# Patient Record
Sex: Female | Born: 2000
Health system: Midwestern US, Community
[De-identification: ages and names within clinical notes are randomized; demographics above are authoritative.]

## PROBLEM LIST (undated history)

## (undated) DIAGNOSIS — R1031 Right lower quadrant pain: Secondary | ICD-10-CM

## (undated) DIAGNOSIS — R0789 Other chest pain: Secondary | ICD-10-CM

## (undated) DIAGNOSIS — R0602 Shortness of breath: Secondary | ICD-10-CM

## (undated) DIAGNOSIS — Z23 Encounter for immunization: Secondary | ICD-10-CM

## (undated) DIAGNOSIS — Z01818 Encounter for other preprocedural examination: Secondary | ICD-10-CM

## (undated) DIAGNOSIS — K648 Other hemorrhoids: Secondary | ICD-10-CM

## (undated) DIAGNOSIS — R112 Nausea with vomiting, unspecified: Secondary | ICD-10-CM

## (undated) DIAGNOSIS — K3184 Gastroparesis: Secondary | ICD-10-CM

## (undated) DIAGNOSIS — H547 Unspecified visual loss: Secondary | ICD-10-CM

## (undated) DIAGNOSIS — K56609 Unspecified intestinal obstruction, unspecified as to partial versus complete obstruction: Secondary | ICD-10-CM

## (undated) DIAGNOSIS — R634 Abnormal weight loss: Secondary | ICD-10-CM

## (undated) DIAGNOSIS — K529 Noninfective gastroenteritis and colitis, unspecified: Secondary | ICD-10-CM

## (undated) DIAGNOSIS — Z931 Gastrostomy status: Secondary | ICD-10-CM

## (undated) DIAGNOSIS — R197 Diarrhea, unspecified: Secondary | ICD-10-CM

## (undated) DIAGNOSIS — R350 Frequency of micturition: Secondary | ICD-10-CM

## (undated) DIAGNOSIS — R109 Unspecified abdominal pain: Secondary | ICD-10-CM

## (undated) DIAGNOSIS — Z1159 Encounter for screening for other viral diseases: Secondary | ICD-10-CM

## (undated) DIAGNOSIS — E559 Vitamin D deficiency, unspecified: Secondary | ICD-10-CM

## (undated) DIAGNOSIS — D61818 Other pancytopenia: Principal | ICD-10-CM

## (undated) DIAGNOSIS — Z30433 Encounter for removal and reinsertion of intrauterine contraceptive device: Secondary | ICD-10-CM

## (undated) DIAGNOSIS — K5901 Slow transit constipation: Secondary | ICD-10-CM

## (undated) DIAGNOSIS — Z111 Encounter for screening for respiratory tuberculosis: Secondary | ICD-10-CM

## (undated) DIAGNOSIS — D490 Neoplasm of unspecified behavior of digestive system: Secondary | ICD-10-CM

## (undated) MED FILL — GUANFACINE HCL ER 1MG TB24: 1 MG | 90 days supply | Qty: 90 | Fill #1 | Status: AC

## (undated) MED FILL — SERTRALINE HCL 50MG TABS: 50 MG | 90 days supply | Qty: 135 | Fill #0 | Status: AC

## (undated) MED FILL — OMEPRAZOLE 40MG CPDR: 40 MG | 90 days supply | Qty: 90 | Fill #0 | Status: AC

## (undated) MED FILL — XOLAIR 150 MG SOLR (SRX): 150 MG | 28 days supply | Qty: 4 | Fill #6 | Status: AC

## (undated) MED FILL — EPINEPHRINE 0.3MG/0.3ML SOAJ: 0.3 MG/0.3ML | 6 days supply | Qty: 6 | Fill #1 | Status: AC

## (undated) MED FILL — GUANFACINE HCL ER 4MG TB24: 4 MG | 90 days supply | Qty: 90 | Fill #3 | Status: AC

## (undated) MED FILL — SERTRALINE HCL 50MG TABS: 50 MG | 90 days supply | Qty: 90 | Fill #0 | Status: AC

## (undated) MED FILL — LANSOPRAZOLE 30MG CPDR: 30 MG | 90 days supply | Qty: 180 | Fill #1 | Status: AC

## (undated) MED FILL — GUANFACINE HCL ER 4MG TB24: 4 MG | 90 days supply | Qty: 90 | Fill #1 | Status: AC

## (undated) MED FILL — XOLAIR 150MG/ML SOSY: 150 MG/ML | 28 days supply | Qty: 2 | Fill #5 | Status: AC

## (undated) MED FILL — LANSOPRAZOLE 30MG CPDR: 30 MG | 90 days supply | Qty: 180 | Fill #0 | Status: AC

## (undated) MED FILL — XOLAIR 150 MG SOLR (SRX): 150 MG | 28 days supply | Qty: 4 | Fill #4 | Status: AC

## (undated) MED FILL — GUANFACINE HCL ER 1MG TB24: 1 MG | 90 days supply | Qty: 90 | Fill #2 | Status: AC

## (undated) MED FILL — SERTRALINE HCL 50MG TABS: 50 MG | 90 days supply | Qty: 135 | Fill #1 | Status: AC

## (undated) MED FILL — VYVANSE 60MG CHEW: 60 MG | 64 days supply | Qty: 64 | Fill #0 | Status: AC

## (undated) MED FILL — EPINEPHRINE 0.3MG/0.3ML SOAJ: 0.3 MG/0.3ML | 6 days supply | Qty: 6 | Fill #0 | Status: AC

## (undated) MED FILL — XOLAIR 150MG/ML SOSY: 150 MG/ML | 28 days supply | Qty: 2 | Fill #6 | Status: AC

## (undated) MED FILL — XOLAIR 150 MG SOLR (SRX): 150 MG | 28 days supply | Qty: 4 | Fill #1 | Status: AC

## (undated) MED FILL — GUANFACINE HCL ER 4MG TB24: 4 MG | 90 days supply | Qty: 90 | Fill #2 | Status: AC

## (undated) MED FILL — GUANFACINE HCL ER 1MG TB24: 1 MG | 90 days supply | Qty: 90 | Fill #3 | Status: AC

## (undated) MED FILL — XOLAIR 150 MG SOLR (SRX): 150 MG | 28 days supply | Qty: 4 | Fill #2 | Status: AC

## (undated) MED FILL — XOLAIR 150MG/ML SOSY (SRX): 150 MG/ML | 28 days supply | Qty: 2 | Fill #4 | Status: AC

## (undated) MED FILL — LANSOPRAZOLE 30MG CPDR: 30 MG | 90 days supply | Qty: 180 | Fill #3 | Status: AC

## (undated) MED FILL — XOLAIR 150MG/ML SOSY (SRX): 150 MG/ML | 28 days supply | Qty: 2 | Fill #1 | Status: AC

## (undated) MED FILL — GUANFACINE HCL ER 4MG TB24: 4 MG | 30 days supply | Qty: 30 | Fill #0 | Status: AC

## (undated) MED FILL — SERTRALINE HCL 50MG TABS: 50 MG | 90 days supply | Qty: 90 | Fill #1 | Status: AC

## (undated) MED FILL — GUANFACINE HCL ER 1MG TB24: 1 MG | 90 days supply | Qty: 90 | Fill #0 | Status: AC

## (undated) MED FILL — SERTRALINE HCL 50MG TABS: 50 MG | 90 days supply | Qty: 135 | Fill #2 | Status: AC

## (undated) MED FILL — MOTEGRITY 2MG TABS: 2 MG | 90 days supply | Qty: 90 | Fill #0 | Status: AC

## (undated) MED FILL — SERTRALINE HCL 50MG TABS: 50 MG | 90 days supply | Qty: 135 | Fill #3 | Status: AC

## (undated) MED FILL — XOLAIR 150MG/ML SOSY (SRX): 150 MG/ML | 28 days supply | Qty: 2 | Fill #2 | Status: AC

## (undated) MED FILL — XOLAIR 150 MG SOLR (SRX): 150 MG | 28 days supply | Qty: 4 | Fill #3 | Status: AC

## (undated) MED FILL — GUANFACINE HCL ER 4MG TB24: 4 MG | 90 days supply | Qty: 90 | Fill #0 | Status: AC

## (undated) MED FILL — XOLAIR 150MG/ML SOSY (SRX): 150 MG/ML | 28 days supply | Qty: 2 | Fill #0 | Status: AC

## (undated) MED FILL — XOLAIR 150MG/ML SOSY (SRX): 150 MG/ML | 28 days supply | Qty: 2 | Fill #3 | Status: AC

## (undated) MED FILL — LANSOPRAZOLE 30MG CPDR: 30 MG | 90 days supply | Qty: 180 | Fill #2 | Status: AC

## (undated) MED FILL — VYVANSE 60MG CHEW: 60 MG | 90 days supply | Qty: 90 | Fill #0 | Status: AC

## (undated) MED FILL — XOLAIR 150 MG SOLR (SRX): 150 MG | 28 days supply | Qty: 4 | Fill #0 | Status: AC

## (undated) MED FILL — XOLAIR 150 MG SOLR (SRX): 150 MG | 28 days supply | Qty: 4 | Fill #5 | Status: AC

---
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
## 2018-07-07 ENCOUNTER — Institutional Professional Consult (permissible substitution): Admit: 2018-07-07 | Discharge: 2018-07-07 | Payer: PRIVATE HEALTH INSURANCE

## 2018-07-07 DIAGNOSIS — Z23 Encounter for immunization: Secondary | ICD-10-CM

## 2018-07-07 NOTE — Progress Notes (Signed)
Pt presents for 3rd Gardasil. See immunization encounter for details.

## 2018-10-07 ENCOUNTER — Inpatient Hospital Stay: Admit: 2018-10-07 | Payer: PRIVATE HEALTH INSURANCE

## 2018-10-07 ENCOUNTER — Other Ambulatory Visit: Admit: 2018-10-07 | Discharge: 2018-10-07 | Payer: PRIVATE HEALTH INSURANCE

## 2018-10-07 ENCOUNTER — Encounter: Admit: 2018-10-07 | Discharge: 2018-10-07 | Payer: PRIVATE HEALTH INSURANCE

## 2018-10-07 DIAGNOSIS — R1030 Lower abdominal pain, unspecified: Secondary | ICD-10-CM

## 2018-10-07 LAB — METABOLIC PANEL, COMPREHENSIVE
A-G Ratio: 1.1 — ABNORMAL LOW (ref 1.2–3.5)
ALT (SGPT): 19 U/L (ref 12–65)
AST (SGOT): 14 U/L — ABNORMAL LOW (ref 15–37)
Albumin: 3.8 g/dL (ref 3.5–5.0)
Alk. phosphatase: 128 U/L (ref 50–136)
Anion gap: 4 mmol/L — ABNORMAL LOW (ref 7–16)
BUN: 8 MG/DL (ref 6–23)
Bilirubin, total: 0.3 MG/DL (ref 0.2–1.1)
CO2: 30 mmol/L (ref 21–32)
Calcium: 9.4 MG/DL (ref 8.3–10.4)
Chloride: 104 mmol/L (ref 98–107)
Creatinine: 0.73 MG/DL (ref 0.6–1.0)
GFR est AA: >60 mL/min/{1.73_m2} (ref >60)
GFR est non-AA: >60 mL/min/{1.73_m2} (ref >60)
Globulin: 3.5 g/dL (ref 2.3–3.5)
Glucose: 100 mg/dL (ref 65–100)
Potassium: 4 mmol/L (ref 3.5–5.1)
Protein, total: 7.3 g/dL (ref 6.3–8.2)
Sodium: 138 mmol/L (ref 136–145)

## 2018-10-07 LAB — CBC WITH AUTOMATED DIFF
ABS. BASOPHILS: 0 10*3/uL (ref 0.0–0.2)
ABS. EOSINOPHILS: 0.3 10*3/uL (ref 0.0–0.8)
ABS. IMM. GRANS.: 0 10*3/uL (ref 0.0–0.5)
ABS. LYMPHOCYTES: 2.7 10*3/uL (ref 0.5–4.6)
ABS. MONOCYTES: 0.7 10*3/uL (ref 0.1–1.3)
ABS. NEUTROPHILS: 4.4 10*3/uL (ref 1.7–8.2)
ABSOLUTE NRBC: 0 10*3/uL (ref 0.0–0.2)
BASOPHILS: 0 % (ref 0.0–2.0)
EOSINOPHILS: 3 % (ref 0.5–7.8)
HCT: 41.3 % (ref 35.8–46.3)
HGB: 13.5 g/dL (ref 11.7–15.4)
IMMATURE GRANULOCYTES: 0 % (ref 0.0–5.0)
LYMPHOCYTES: 34 % (ref 13–44)
MCH: 28.4 PG (ref 26.1–32.9)
MCHC: 32.7 g/dL (ref 31.4–35.0)
MCV: 86.8 FL (ref 79.6–97.8)
MONOCYTES: 8 % (ref 4.0–12.0)
MPV: 11.6 FL (ref 9.4–12.3)
NEUTROPHILS: 55 % (ref 43–78)
PLATELET: 247 10*3/uL (ref 150–450)
RBC: 4.76 M/uL (ref 4.05–5.2)
RDW: 14 % (ref 11.9–14.6)
WBC: 8.1 10*3/uL (ref 4.3–11.1)

## 2018-10-07 LAB — CBC WITH AUTO DIFFERENTIAL
Basophils %: 0 % (ref 0.0–2.0)
Basophils Absolute: 0 10*3/uL (ref 0.0–0.2)
Eosinophils %: 3 % (ref 0.5–7.8)
Eosinophils Absolute: 0.3 10*3/uL (ref 0.0–0.8)
Granulocyte Absolute Count: 0 10*3/uL (ref 0.0–0.5)
Hematocrit: 41.3 % (ref 35.8–46.3)
Hemoglobin: 13.5 g/dL (ref 11.7–15.4)
Immature Granulocytes: 0 % (ref 0.0–5.0)
Lymphocytes %: 34 % (ref 13–44)
Lymphocytes Absolute: 2.7 10*3/uL (ref 0.5–4.6)
MCH: 28.4 PG (ref 26.1–32.9)
MCHC: 32.7 g/dL (ref 31.4–35.0)
MCV: 86.8 FL (ref 79.6–97.8)
MPV: 11.6 FL (ref 9.4–12.3)
Monocytes %: 8 % (ref 4.0–12.0)
Monocytes Absolute: 0.7 10*3/uL (ref 0.1–1.3)
NRBC Absolute: 0 10*3/uL (ref 0.0–0.2)
Neutrophils %: 55 % (ref 43–78)
Neutrophils Absolute: 4.4 10*3/uL (ref 1.7–8.2)
Platelets: 247 10*3/uL (ref 150–450)
RBC: 4.76 M/uL (ref 4.05–5.2)
RDW: 14 % (ref 11.9–14.6)
WBC: 8.1 10*3/uL (ref 4.3–11.1)

## 2018-10-07 LAB — COMPREHENSIVE METABOLIC PANEL
ALT: 19 U/L (ref 12–65)
AST: 14 U/L — ABNORMAL LOW (ref 15–37)
Albumin/Globulin Ratio: 1.1 — ABNORMAL LOW (ref 1.2–3.5)
Albumin: 3.8 g/dL (ref 3.5–5.0)
Alkaline Phosphatase: 128 U/L (ref 50–136)
Anion Gap: 4 mmol/L — ABNORMAL LOW (ref 7–16)
BUN: 8 MG/DL (ref 6–23)
CO2: 30 mmol/L (ref 21–32)
Calcium: 9.4 MG/DL (ref 8.3–10.4)
Chloride: 104 mmol/L (ref 98–107)
Creatinine: 0.73 MG/DL (ref 0.6–1.0)
EGFR IF NonAfrican American: >60 mL/min/{1.73_m2} (ref >60)
GFR African American: >60 mL/min/{1.73_m2} (ref >60)
Globulin: 3.5 g/dL (ref 2.3–3.5)
Glucose: 100 mg/dL (ref 65–100)
Potassium: 4 mmol/L (ref 3.5–5.1)
Sodium: 138 mmol/L (ref 136–145)
Total Bilirubin: 0.3 MG/DL (ref 0.2–1.1)
Total Protein: 7.3 g/dL (ref 6.3–8.2)

## 2018-10-07 NOTE — Progress Notes (Signed)
Pt is experience lower abdominal pain. Had [place] to r/u ovarian cysts. Labs today: cbc,cmp STAT.

## 2018-10-20 ENCOUNTER — Inpatient Hospital Stay: Admit: 2018-10-20 | Payer: PRIVATE HEALTH INSURANCE

## 2018-10-20 DIAGNOSIS — R197 Diarrhea, unspecified: Secondary | ICD-10-CM

## 2018-10-21 MED FILL — GUANFACINE HCL ER 4MG TB24: 4 mg | 90 days supply | Qty: 90 | Fill #1 | Status: AC

## 2018-10-22 ENCOUNTER — Encounter

## 2018-10-22 ENCOUNTER — Inpatient Hospital Stay: Admit: 2018-10-22 | Payer: PRIVATE HEALTH INSURANCE

## 2018-10-22 DIAGNOSIS — R1031 Right lower quadrant pain: Secondary | ICD-10-CM

## 2018-10-22 LAB — OVA & PARASITES, STOOL

## 2018-10-22 LAB — H PYLORI AG, STOOL
H. Pylori Stool Ag,EIA: NEGATIVE
H. pylori Ag, stool, EIA: NEGATIVE

## 2018-10-22 LAB — CULTURE, STOOL

## 2018-10-22 LAB — OVA & PARASITES, STOOL, REFLEX

## 2018-10-22 LAB — OVA AND PARASITE SCREEN

## 2018-10-28 MED FILL — EPINEPHRINE 0.3MG/0.3ML SOAJ: 0.3 mg/mL | 6 days supply | Qty: 6 | Fill #0 | Status: AC

## 2018-12-02 DIAGNOSIS — T7840XA Allergy, unspecified, initial encounter: Secondary | ICD-10-CM

## 2018-12-02 NOTE — ED Provider Notes (Signed)
Tonight she ate some ice cream that she thought was begin that ended up being dairy.  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  She developed itching and a tingling to her tongue.  She did not have any frank wheezing or definitive airway issues.  Starkly she is able to use Benadryl most times with abatement of most of her symptoms although she has had to use epi-pen either 5 or 7 times in the past.  On arrival to the emergency room she was initiated on Solu-Medrol 125 mg/Benadryl 50 mg and Pepcid 20 mg and is continued to improve through her stay.    The history is provided by the patient.   Allergic Reaction    Chronicity: History of. The ingested item was diphenhydramine. Pertinent negatives include no shortness of breath.        No past medical history on file.    No past surgical history on file.      No family history on file.    Social History     Socioeconomic History   ??? Marital status: SINGLE     Spouse name: Not on file   ??? Number of children: Not on file   ??? Years of education: Not on file   ??? Highest education level: Not on file   Occupational History   ??? Not on file   Social Needs   ??? Financial resource strain: Not on file   ??? Food insecurity     Worry: Not on file     Inability: Not on file   ??? Transportation needs     Medical: Not on file     Non-medical: Not on file   Tobacco Use   ??? Smoking status: Not on file   Substance and Sexual Activity   ??? Alcohol use: Not on file   ??? Drug use: Not on file   ??? Sexual activity: Not on file   Lifestyle   ??? Physical activity     Days per week: Not on file     Minutes per session: Not on file   ??? Stress: Not on file   Relationships   ??? Social Product manager on phone: Not on file     Gets together: Not on file     Attends religious service: Not on file     Active member of club or organization: Not on file     Attends meetings of clubs or organizations: Not on file     Relationship status: Not on file   ??? Intimate partner violence      Fear of current or ex partner: Not on file     Emotionally abused: Not on file     Physically abused: Not on file     Forced sexual activity: Not on file   Other Topics Concern   ??? Not on file   Social History Narrative   ??? Not on file         ALLERGIES: Milk; Milk containing products; Cat dander; and Dog dander    Review of Systems   Constitutional: Negative for chills and fever.   Respiratory: Negative for shortness of breath and wheezing.    Cardiovascular: Negative for leg swelling.   Gastrointestinal: Negative.    Skin:        Diffuse itching   All other systems reviewed and are negative.      Vitals:    12/02/18 2158 12/02/18 2231 12/02/18  2300   BP: 129/83 109/55 112/70   Pulse: 91 86 75   Resp: 24     Temp: 98.4 ??F (36.9 ??C)     SpO2: 98% 99% 97%   Weight: 55.3 kg (122 lb)     Height: 5\' 5"  (1.651 m)              Physical Exam  Vitals signs and nursing note reviewed.   Constitutional:       General: She is not in acute distress.     Appearance: She is well-developed. She is not ill-appearing or toxic-appearing.      Comments: Very cooperative   HENT:      Head: Atraumatic.      Nose: Nose normal.      Mouth/Throat:      Mouth: Mucous membranes are moist.      Pharynx: No posterior oropharyngeal erythema.      Comments: No airway issues  Eyes:      General: No scleral icterus.  Neck:      Musculoskeletal: Neck supple.   Cardiovascular:      Rate and Rhythm: Normal rate.   Pulmonary:      Effort: Pulmonary effort is normal. No respiratory distress.      Breath sounds: Normal breath sounds. No wheezing.   Skin:     General: Skin is warm and dry.      Findings: Rash is not urticarial.   Neurological:      General: No focal deficit present.      Mental Status: She is alert.   Psychiatric:         Thought Content: Thought content normal.          MDM  Number of Diagnoses or Management Options  Allergic reaction, initial encounter:   Diagnosis management comments: Inadvertent ingestion of dairy-based ice cream  with history of significant allergy to dairy products.  Responds well to medications in our department at no point where her oxygen saturations low.  She has no wheezing.  She has no present visible abnormality to skin.  Airway is patent with no stridor.       Amount and/or Complexity of Data Reviewed  Obtain history from someone other than the patient: yes (Mother)    Risk of Complications, Morbidity, and/or Mortality  Presenting problems: moderate  Management options: moderate  General comments: There is a physician and can watch closely at home she also has EpiPen's available.    Patient Progress  Patient progress: improved         Procedures

## 2018-12-02 NOTE — ED Notes (Signed)
 I have reviewed discharge instructions with the patient.  The patient verbalized understanding.    Patient left ED via Discharge Method: ambulatory to Home with parent.    Opportunity for questions and clarification provided.       Patient given 0 scripts.         To continue your aftercare when you leave the hospital, you may receive an automated call from our care team to check in on how you are doing.  This is a free service and part of our promise to provide the best care and service to meet your aftercare needs." If you have questions, or wish to unsubscribe from this service please call [phone].  Thank you for Choosing our [facility] Emergency Department.

## 2018-12-02 NOTE — ED Notes (Signed)
Pt ambulatory to triage wearing a mask. Pt is jittery, talking quickly, and hyperventilating. 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 Pt reports she ate some ice cream that she was told was vegan. Pt reports she normally takes Benadryl, but was out. Pt also reports she has an Epi pen but didn't use it. Pt reports her tongue and throat are itching. Pt also reports intense itching all over. Pt does not have any visible hives. Pt also reports she has been taking allergy shots. Pt has Hx of autism. Pt has some red irritation noted to tip of tongue. No swelling noted to lips, face or tongue.

## 2018-12-02 NOTE — ED Provider Notes (Signed)
Tonight she ate some ice cream that she thought was begin that ended up being dairy.  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  She developed itching and a tingling to her tongue.  She did not have any frank wheezing or definitive airway issues.  Starkly she is able to use Benadryl most times with abatement of most of her symptoms although she has had to use epi-pen either 5 or 7 times in the past.  On arrival to the emergency room she was initiated on Solu-Medrol 125 mg/Benadryl 50 mg and Pepcid 20 mg and is continued to improve through her stay.    The history is provided by the patient.   Allergic Reaction    Chronicity: History of. The ingested item was diphenhydramine. Pertinent negatives include no shortness of breath.        No past medical history on file.    No past surgical history on file.      No family history on file.    Social History     Socioeconomic History   ??? Marital status: SINGLE     Spouse name: Not on file   ??? Number of children: Not on file   ??? Years of education: Not on file   ??? Highest education level: Not on file   Occupational History   ??? Not on file   Social Needs   ??? Financial resource strain: Not on file   ??? Food insecurity     Worry: Not on file     Inability: Not on file   ??? Transportation needs     Medical: Not on file     Non-medical: Not on file   Tobacco Use   ??? Smoking status: Not on file   Substance and Sexual Activity   ??? Alcohol use: Not on file   ??? Drug use: Not on file   ??? Sexual activity: Not on file   Lifestyle   ??? Physical activity     Days per week: Not on file     Minutes per session: Not on file   ??? Stress: Not on file   Relationships   ??? Social Product manager on phone: Not on file     Gets together: Not on file     Attends religious service: Not on file     Active member of club or organization: Not on file     Attends meetings of clubs or organizations: Not on file     Relationship status: Not on file   ??? Intimate partner violence      Fear of current or ex partner: Not on file     Emotionally abused: Not on file     Physically abused: Not on file     Forced sexual activity: Not on file   Other Topics Concern   ??? Not on file   Social History Narrative   ??? Not on file         ALLERGIES: Milk; Milk containing products; Cat dander; and Dog dander    Review of Systems   Constitutional: Negative for chills and fever.   Respiratory: Negative for shortness of breath and wheezing.    Cardiovascular: Negative for leg swelling.   Gastrointestinal: Negative.    Skin:        Diffuse itching   All other systems reviewed and are negative.      Vitals:    12/02/18 2158 12/02/18 2231 12/02/18  2300   BP: 129/83 109/55 112/70   Pulse: 91 86 75   Resp: 24     Temp: 98.4 ??F (36.9 ??C)     SpO2: 98% 99% 97%   Weight: 55.3 kg (122 lb)     Height: 5\' 5"  (1.651 m)              Physical Exam  Vitals signs and nursing note reviewed.   Constitutional:       General: She is not in acute distress.     Appearance: She is well-developed. She is not ill-appearing or toxic-appearing.      Comments: Very cooperative   HENT:      Head: Atraumatic.      Nose: Nose normal.      Mouth/Throat:      Mouth: Mucous membranes are moist.      Pharynx: No posterior oropharyngeal erythema.      Comments: No airway issues  Eyes:      General: No scleral icterus.  Neck:      Musculoskeletal: Neck supple.   Cardiovascular:      Rate and Rhythm: Normal rate.   Pulmonary:      Effort: Pulmonary effort is normal. No respiratory distress.      Breath sounds: Normal breath sounds. No wheezing.   Skin:     General: Skin is warm and dry.      Findings: Rash is not urticarial.   Neurological:      General: No focal deficit present.      Mental Status: She is alert.   Psychiatric:         Thought Content: Thought content normal.          MDM  Number of Diagnoses or Management Options  Allergic reaction, initial encounter:   Diagnosis management comments: Inadvertent ingestion of dairy-based ice  cream with history of significant allergy to dairy products.  Responds well to medications in our department at no point where her oxygen saturations low.  She has no wheezing.  She has no present visible abnormality to skin.  Airway is patent with no stridor.       Amount and/or Complexity of Data Reviewed  Obtain history from someone other than the patient: yes (Mother)    Risk of Complications, Morbidity, and/or Mortality  Presenting problems: moderate  Management options: moderate  General comments: There is a physician and can watch closely at home she also has EpiPen's available.    Patient Progress  Patient progress: improved         Procedures

## 2018-12-02 NOTE — ED Triage Notes (Addendum)
Pt ambulatory to triage wearing a mask. Pt is jittery, talking quickly, and hyperventilating. 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 Pt reports she ate some ice cream that she was told was vegan. Pt reports she normally takes Benadryl, but was out. Pt also reports she has an Epi pen but didn't use it. Pt reports her tongue and throat are itching. Pt also reports intense itching all over. Pt does not have any visible hives. Pt also reports she has been taking allergy shots. Pt has Hx of autism. Pt has some red irritation noted to tip of tongue. No swelling noted to lips, face or tongue.

## 2018-12-02 NOTE — ED Notes (Signed)
I have reviewed discharge instructions with the patient.  The patient verbalized understanding.    Patient left ED via Discharge Method: ambulatory to Home with parent.   Opportunity for questions and clarification provided.       Patient given 0 scripts.         To continue your aftercare when you leave the hospital, you may receive an automated call from our care team to check in on how you are doing.  This is a free service and part of our promise to provide the best care and service to meet your aftercare needs.??? If you have questions, or wish to unsubscribe from this service please call [phone].  Thank you for Choosing our [facility] Emergency Department.

## 2018-12-03 ENCOUNTER — Inpatient Hospital Stay
Admit: 2018-12-03 | Discharge: 2018-12-03 | Disposition: A | Discharge status: Home / Self Care | Payer: PRIVATE HEALTH INSURANCE | Attending: Emergency Medicine

## 2018-12-03 LAB — CBC WITH AUTOMATED DIFF
ABS. BASOPHILS: 0 10*3/uL (ref 0.0–0.2)
ABS. EOSINOPHILS: 0.2 10*3/uL (ref 0.0–0.8)
ABS. IMM. GRANS.: 0 10*3/uL (ref 0.0–0.5)
ABS. LYMPHOCYTES: 3 10*3/uL (ref 0.5–4.6)
ABS. MONOCYTES: 0.6 10*3/uL (ref 0.1–1.3)
ABS. NEUTROPHILS: 2.6 10*3/uL (ref 1.7–8.2)
ABSOLUTE NRBC: 0 10*3/uL (ref 0.0–0.2)
BASOPHILS: 1 % (ref 0.0–2.0)
EOSINOPHILS: 3 % (ref 0.5–7.8)
HCT: 36.3 % (ref 35.8–46.3)
HGB: 11.9 g/dL (ref 11.7–15.4)
IMMATURE GRANULOCYTES: 0 % (ref 0.0–5.0)
LYMPHOCYTES: 46 % — ABNORMAL HIGH (ref 13–44)
MCH: 29.2 PG (ref 26.1–32.9)
MCHC: 32.8 g/dL (ref 31.4–35.0)
MCV: 89 FL (ref 79.6–97.8)
MONOCYTES: 10 % (ref 4.0–12.0)
MPV: 10.6 FL (ref 9.4–12.3)
NEUTROPHILS: 40 % — ABNORMAL LOW (ref 43–78)
PLATELET: 262 10*3/uL (ref 150–450)
RBC: 4.08 M/uL (ref 4.05–5.2)
RDW: 14.2 % (ref 11.9–14.6)
WBC: 6.5 10*3/uL (ref 4.3–11.1)

## 2018-12-03 LAB — METABOLIC PANEL, COMPREHENSIVE
A-G Ratio: 1 — ABNORMAL LOW (ref 1.2–3.5)
ALT (SGPT): 19 U/L (ref 12–65)
AST (SGOT): 18 U/L (ref 15–37)
Albumin: 3.9 g/dL (ref 3.5–5.0)
Alk. phosphatase: 104 U/L (ref 50–136)
Anion gap: 7 mmol/L (ref 7–16)
BUN: 15 MG/DL (ref 6–23)
Bilirubin, total: 0.3 MG/DL (ref 0.2–1.1)
CO2: 27 mmol/L (ref 21–32)
Calcium: 9.2 MG/DL (ref 8.3–10.4)
Chloride: 108 mmol/L — ABNORMAL HIGH (ref 98–107)
Creatinine: 0.73 MG/DL (ref 0.6–1.0)
GFR est AA: >60 mL/min/{1.73_m2} (ref >60)
GFR est non-AA: >60 mL/min/{1.73_m2} (ref >60)
Globulin: 3.8 g/dL — ABNORMAL HIGH (ref 2.3–3.5)
Glucose: 82 mg/dL (ref 65–100)
Potassium: 3.3 mmol/L — ABNORMAL LOW (ref 3.5–5.1)
Protein, total: 7.7 g/dL (ref 6.3–8.2)
Sodium: 142 mmol/L (ref 136–145)

## 2018-12-03 LAB — CBC WITH AUTO DIFFERENTIAL
Basophils %: 1 % (ref 0.0–2.0)
Basophils Absolute: 0 10*3/uL (ref 0.0–0.2)
Eosinophils %: 3 % (ref 0.5–7.8)
Eosinophils Absolute: 0.2 10*3/uL (ref 0.0–0.8)
Granulocyte Absolute Count: 0 10*3/uL (ref 0.0–0.5)
Hematocrit: 36.3 % (ref 35.8–46.3)
Hemoglobin: 11.9 g/dL (ref 11.7–15.4)
Immature Granulocytes: 0 % (ref 0.0–5.0)
Lymphocytes %: 46 % — ABNORMAL HIGH (ref 13–44)
Lymphocytes Absolute: 3 10*3/uL (ref 0.5–4.6)
MCH: 29.2 PG (ref 26.1–32.9)
MCHC: 32.8 g/dL (ref 31.4–35.0)
MCV: 89 FL (ref 79.6–97.8)
MPV: 10.6 FL (ref 9.4–12.3)
Monocytes %: 10 % (ref 4.0–12.0)
Monocytes Absolute: 0.6 10*3/uL (ref 0.1–1.3)
NRBC Absolute: 0 10*3/uL (ref 0.0–0.2)
Neutrophils %: 40 % — ABNORMAL LOW (ref 43–78)
Neutrophils Absolute: 2.6 10*3/uL (ref 1.7–8.2)
Platelets: 262 10*3/uL (ref 150–450)
RBC: 4.08 M/uL (ref 4.05–5.2)
RDW: 14.2 % (ref 11.9–14.6)
WBC: 6.5 10*3/uL (ref 4.3–11.1)

## 2018-12-03 LAB — COMPREHENSIVE METABOLIC PANEL
ALT: 19 U/L (ref 12–65)
AST: 18 U/L (ref 15–37)
Albumin/Globulin Ratio: 1 — ABNORMAL LOW (ref 1.2–3.5)
Albumin: 3.9 g/dL (ref 3.5–5.0)
Alkaline Phosphatase: 104 U/L (ref 50–136)
Anion Gap: 7 mmol/L (ref 7–16)
BUN: 15 MG/DL (ref 6–23)
CO2: 27 mmol/L (ref 21–32)
Calcium: 9.2 MG/DL (ref 8.3–10.4)
Chloride: 108 mmol/L — ABNORMAL HIGH (ref 98–107)
Creatinine: 0.73 MG/DL (ref 0.6–1.0)
EGFR IF NonAfrican American: >60 mL/min/{1.73_m2} (ref >60)
GFR African American: >60 mL/min/{1.73_m2} (ref >60)
Globulin: 3.8 g/dL — ABNORMAL HIGH (ref 2.3–3.5)
Glucose: 82 mg/dL (ref 65–100)
Potassium: 3.3 mmol/L — ABNORMAL LOW (ref 3.5–5.1)
Sodium: 142 mmol/L (ref 136–145)
Total Bilirubin: 0.3 MG/DL (ref 0.2–1.1)
Total Protein: 7.7 g/dL (ref 6.3–8.2)

## 2018-12-03 MED ORDER — DIPHENHYDRAMINE HCL 50 MG/ML IJ SOLN
50 mg/mL | INTRAMUSCULAR | Status: AC
Start: 2018-12-03 — End: 2018-12-02
  Administered 2018-12-03: 03:00:00 via INTRAVENOUS

## 2018-12-03 MED ORDER — SODIUM CHLORIDE 0.9 % INJECTION
202 mg/2 mL | INTRAMUSCULAR | Status: AC
Start: 2018-12-03 — End: 2018-12-02
  Administered 2018-12-03: 02:00:00 via INTRAVENOUS

## 2018-12-03 MED ORDER — DIPHENHYDRAMINE HCL 50 MG/ML IJ SOLN
50 mg/mL | INTRAMUSCULAR | Status: AC
Start: 2018-12-03 — End: 2018-12-02
  Administered 2018-12-03: 02:00:00 via INTRAVENOUS

## 2018-12-03 MED ORDER — METHYLPREDNISOLONE (PF) 125 MG/2 ML IJ SOLR
125 mg/2 mL | INTRAMUSCULAR | Status: AC
Start: 2018-12-03 — End: 2018-12-02
  Administered 2018-12-03: 02:00:00 via INTRAVENOUS

## 2018-12-03 MED FILL — FAMOTIDINE (PF) 20 MG/2 ML IV: 20 mg/2 mL | INTRAVENOUS | Qty: 2

## 2018-12-03 MED FILL — DIPHENHYDRAMINE HCL 50 MG/ML IJ SOLN: 50 mg/mL | INTRAMUSCULAR | Qty: 1

## 2018-12-03 MED FILL — SOLU-MEDROL (PF) 125 MG/2 ML SOLUTION FOR INJECTION: 125 mg/2 mL | INTRAMUSCULAR | Qty: 2

## 2018-12-03 NOTE — Progress Notes (Signed)
Verified DOB and address for HIPAA security.  Introduced [employer] for patient.   Patient's dad does not identify any Care Management needs at this time and declines services.      Patient seen in ER at [facility] for allergic reaction after eating ice cream. 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 Patient thought ice cream was vegan but developed throat and tongue itching as well as diffuse itching over entire body. Went to Er received solumedrol, benadryl and Pepcid. Symptoms resolved and she was discharged home to continue Benadryl Q 4-6 hours and Pepcid 20 mg BID.   Spoke with patient's dad who reports patient is doing well today, denies any further symptoms. Patient's mom is a physician and in home. Reviewed Red Flag Symptoms and dad reports patient and family well aware of symptoms and when to seek emergency care. Appreciative of care in ER and of call but states no needs for CM services. Left my contact number for any future needs.

## 2018-12-03 NOTE — Progress Notes (Signed)
Verified DOB and address for HIPAA security.  Introduced [employer] for patient.   Patient's dad does not identify any Care Management needs at this time and declines services.      Patient seen in ER at [facility] for allergic reaction after eating ice cream. 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 Patient thought ice cream was vegan but developed throat and tongue itching as well as diffuse itching over entire body. Went to Er received solumedrol, benadryl and Pepcid. Symptoms resolved and she was discharged home to continue Benadryl Q 4-6 hours and Pepcid 20 mg BID.   Spoke with patient's dad who reports patient is doing well today, denies any further symptoms. Patient's mom is a physician and in home. Reviewed Red Flag Symptoms and dad reports patient and family well aware of symptoms and when to seek emergency care. Appreciative of care in ER and of call but states no needs for CM services. Left my contact number for any future needs.

## 2018-12-06 ENCOUNTER — Encounter

## 2018-12-07 ENCOUNTER — Encounter

## 2019-01-07 ENCOUNTER — Institutional Professional Consult (permissible substitution): Payer: PRIVATE HEALTH INSURANCE

## 2019-01-07 DIAGNOSIS — Z1152 Encounter for screening for COVID-19: Secondary | ICD-10-CM

## 2019-01-07 NOTE — Progress Notes (Signed)
Patient presented to the [facility] for COVID testing as ordered.  After verbal consent given by patient/caregiver,  nasal swab obtained and sent to lab for processing.

## 2019-01-07 NOTE — Interval H&P Note (Signed)
Patient verified name, DOB, and procedure.    Type: 1a; abbreviated assessment per anesthesia guidelines    Labs per anesthesia: none    Instructed pt that they will be notified the day before their procedure by the GI Lab for time of arrival if their procedure is [facility] and Pre-op for [place] cases. Arrival times should be called by 5 pm. If no phone is received the patient should contact their respective hospital. The GI lab telephone number is [phone] and ES Pre-op is [phone].     Follow diet and prep instructions per office including NPO status.  If patient has NOT received instructions from office patient is advised to call surgeon office, verbalizes understanding.    Bath or shower the night before and the am of surgery with non-mositurizing soap. No lotions, oils, powders, cologne on skin. No make up, eye make up or jewelry. Wear loose fitting comfortable, clean clothing.     Must have adult present in building the entire time .     Medications for the day of procedure: sertraline, Intuniv, vyanse, patient to hold: all vitamins supplements and nsaids    The following discharge instructions reviewed with patient: medication given during procedure may cause drowsiness for several hours, therefore, do not drive or operate machinery for remainder of the day. You may not drink alcohol on the day of your procedure, please resume regular diet and activity unless otherwise directed. You may experience abdominal distention for several hours that is relieved by the passage of gas. Contact your physician if you have any of the following: fever or chills, severe abdominal pain or excessive amount of bleeding or a large amount when having a bowel movement. Occasional specks of blood with bowel movement would not be unusual.

## 2019-01-07 NOTE — Other (Signed)
Patient verified name, DOB, and procedure.    Type: 1a; abbreviated assessment per anesthesia guidelines    Labs per anesthesia: none    Instructed pt that they will be notified the day before their procedure by the GI Lab for time of arrival if their procedure is [facility] and Pre-op for [place] cases. Arrival times should be called by 5 pm. If no phone is received the patient should contact their respective hospital. The GI lab telephone number is [phone] and ES Pre-op is [phone].     Follow diet and prep instructions per office including NPO status.  If patient has NOT received instructions from office patient is advised to call surgeon office, verbalizes understanding.    Bath or shower the night before and the am of surgery with non-mositurizing soap. No lotions, oils, powders, cologne on skin. No make up, eye make up or jewelry. Wear loose fitting comfortable, clean clothing.     Must have adult present in building the entire time .     Medications for the day of procedure: sertraline, Intuniv, vyanse, patient to hold: all vitamins supplements and nsaids    The following discharge instructions reviewed with patient: medication given during procedure may cause drowsiness for several hours, therefore, do not drive or operate machinery for remainder of the day. You may not drink alcohol on the day of your procedure, please resume regular diet and activity unless otherwise directed. You may experience abdominal distention for several hours that is relieved by the passage of gas. Contact your physician if you have any of the following: fever or chills, severe abdominal pain or excessive amount of bleeding or a large amount when having a bowel movement. Occasional specks of blood with bowel movement would not be unusual.

## 2019-01-08 ENCOUNTER — Inpatient Hospital Stay

## 2019-01-09 LAB — NOVEL CORONAVIRUS (COVID-19): SARS-CoV-2, NAA: NOT DETECTED

## 2019-01-09 LAB — COVID-19: SARS-CoV-2, NAA: NOT DETECTED

## 2019-01-13 NOTE — Progress Notes (Signed)
Called Trilla Ellerbrock (pts mother) and left message asking to confirm patients procedure tomorrow. Arrival time is 10:30 to the main lobby to register.

## 2019-01-13 NOTE — Progress Notes (Signed)
Called Natalea Krengel (pts mother) and left message asking to confirm patients procedure tomorrow. Arrival time is 10:30 to the main lobby to register.

## 2019-01-14 ENCOUNTER — Inpatient Hospital Stay: Payer: PRIVATE HEALTH INSURANCE

## 2019-01-14 MED ORDER — PROPOFOL 10 MG/ML IV EMUL
10 mg/mL | INTRAVENOUS | Status: DC | PRN
Start: 2019-01-14 — End: 2019-01-14
  Administered 2019-01-14 (×2): via INTRAVENOUS

## 2019-01-14 MED ORDER — LIDOCAINE (PF) 20 MG/ML (2 %) IJ SOLN
20 mg/mL (2 %) | INTRAMUSCULAR | Status: DC | PRN
Start: 2019-01-14 — End: 2019-01-14
  Administered 2019-01-14: 16:00:00 via INTRAVENOUS

## 2019-01-14 MED ORDER — LACTATED RINGERS IV
INTRAVENOUS | Status: DC
Start: 2019-01-14 — End: 2019-01-14
  Administered 2019-01-14: 15:00:00 via INTRAVENOUS

## 2019-01-14 MED ORDER — PROPOFOL 10 MG/ML IV EMUL
10 mg/mL | INTRAVENOUS | Status: DC | PRN
Start: 2019-01-14 — End: 2019-01-14
  Administered 2019-01-14 (×4): via INTRAVENOUS

## 2019-01-14 NOTE — Procedures (Signed)
Colonoscopy Procedure Note    PreOp Diagnosis:   Constipation  RLQ pain  Rectal bleeding     PostOp Diagnosis:  Small internal hemorrhoids     Medications:  Monitored Anesthesia    Procedure:  Colonoscopy  Instrument: P CFQ 180 AL  After informed consent was obtained, the patient was sedated and the colonoscope was inserted  in the anus and advanced into the cecum without difficulty.  The scope was slowly withdrawn while the mucosa was carefully inspected, including a retroflexed view of the rectum.     Prep: Fair     Findings:   TERMINAL ILEUM:  The terminal ileum was entered and appeared normal.  There were no inflammatory changes and the mucosa appeared intact.    COLON:  The appendiceal orifice, cecum and ileocecal valve were positively identified.  Retroflexion was performed in the cecum and ascending colon.  The colon was carefully examined on slow withdrawal from the cecum to the rectum.  There were no polyps, diverticula, masses, inflammatory changes, or vascular malformations.  The left colon was tortuous.     RECTUM:  The rectal examination demonstrated hemorrhoids.  These were Small and internal .  Otherwise,  antegrade and retroflexed views appeared normal without inflammation, polyps or mass lesions.     Recommendations:  Begin Linzess   High fiber diet for bowel regularity.  Daily fiber supplement if needed. High fiber diet for bowel regularity.  Daily fiber supplement if needed.   Repeat colonoscopy at age 69   Routine office followup     Lorie Apley, MD

## 2019-01-14 NOTE — Anesthesia Post-Procedure Evaluation (Signed)
Procedure(s):  COLONOSCOPY/  BMI 21 PT IS AUTISTIC.    total IV anesthesia    Anesthesia Post Evaluation      Multimodal analgesia: multimodal analgesia used between 6 hours prior to anesthesia start to PACU discharge  Patient location during evaluation: bedside  Patient participation: complete - patient participated  Level of consciousness: awake  Pain management: adequate  Airway patency: patent  Anesthetic complications: no  Cardiovascular status: acceptable and stable  Respiratory status: acceptable and room air  Hydration status: acceptable  Post anesthesia nausea and vomiting:  none      INITIAL Post-op Vital signs:   Vitals Value Taken Time   BP 125/75 01/14/2019 12:55 PM   Temp 36.7 ??C (98 ??F) 01/14/2019 12:26 PM   Pulse 85 01/14/2019 12:58 PM   Resp 12 01/14/2019 12:26 PM   SpO2 100 % 01/14/2019 12:58 PM   Vitals shown include unvalidated device data.

## 2019-01-14 NOTE — Procedures (Signed)
Colonoscopy Procedure Note    PreOp Diagnosis:   Constipation  RLQ pain  Rectal bleeding     PostOp Diagnosis:  Small internal hemorrhoids     Medications:  Monitored Anesthesia    Procedure:  Colonoscopy  Instrument: P CFQ 180 AL  After informed consent was obtained, the patient was sedated and the colonoscope was inserted  in the anus and advanced into the cecum without difficulty.  The scope was slowly withdrawn while the mucosa was carefully inspected, including a retroflexed view of the rectum.     Prep: Fair     Findings:   TERMINAL ILEUM:  The terminal ileum was entered and appeared normal.  There were no inflammatory changes and the mucosa appeared intact.    COLON:  The appendiceal orifice, cecum and ileocecal valve were positively identified.  Retroflexion was performed in the cecum and ascending colon.  The colon was carefully examined on slow withdrawal from the cecum to the rectum.  There were no polyps, diverticula, masses, inflammatory changes, or vascular malformations.  The left colon was tortuous.     RECTUM:  The rectal examination demonstrated hemorrhoids.  These were Small and internal .  Otherwise,  antegrade and retroflexed views appeared normal without inflammation, polyps or mass lesions.     Recommendations:  Begin Linzess   High fiber diet for bowel regularity.  Daily fiber supplement if needed. High fiber diet for bowel regularity.  Daily fiber supplement if needed.   Repeat colonoscopy at age 14   Routine office followup     Lorie Apley, MD

## 2019-01-14 NOTE — H&P (Signed)
Gastroenterology Outpatient History and Physical    Patient: Robin Mendoza    Physician: Flint Melter, MD    Chief Complaint: Constipation    History of Present Illness: LLQ pain    Justification for Procedure: As above    History:  Past Medical History:   Diagnosis Date   ??? ADHD     managed with meds   ??? Autism       Past Surgical History:   Procedure Laterality Date   ??? HX OTHER SURGICAL      EGD      Social History     Socioeconomic History   ??? Marital status: SINGLE     Spouse name: Not on file   ??? Number of children: Not on file   ??? Years of education: Not on file   ??? Highest education level: Not on file   Tobacco Use   ??? Smoking status: Never Smoker   ??? Smokeless tobacco: Never Used   Substance and Sexual Activity   ??? Alcohol use: Not Currently    History reviewed. No pertinent family history.    Allergies:   Allergies   Allergen Reactions   ??? Milk Anaphylaxis   ??? Milk Containing Products Anaphylaxis   ??? Cat Dander Itching   ??? Dog Dander Itching       Medications:   Prior to Admission medications    Medication Sig Start Date End Date Taking? Authorizing Provider   sertraline HCl (SERTRALINE PO) Take  by mouth.   Yes Provider, Historical   guanfacine HCl (INTUNIV ER PO) Take  by mouth.   Yes Provider, Historical   lisdexamfetamine dimesylate (VYVANSE PO) Take  by mouth.   Yes Provider, Historical       Vital Signs: Blood pressure 122/71, pulse 85, temperature 98 ??F (36.7 ??C), resp. rate 16, height 5\' 4"  (1.626 m), weight 56.2 kg (124 lb), SpO2 99 %, not currently breastfeeding.    Physical Exam:   General: alert      Heart: regular rate and rhythm   Lungs: no tachypnea, retractions or cyanosis, Heart exam - S1, S2 normal, no murmur, no gallop, rate regular   Abdominal: Bowel sounds are normal, soft, non distended             Plan of Care/Planned Procedure: Colonoscopy    Signed:  Flint Melter, MD 01/14/2019

## 2019-01-14 NOTE — Anesthesia Pre-Procedure Evaluation (Signed)
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

## 2019-01-14 NOTE — Other (Signed)
Discharge paperwork reviewed with patient and mother, all questions answered and IV removed prior to DC.

## 2019-01-14 NOTE — Anesthesia Pre-Procedure Evaluation (Addendum)
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

## 2019-02-15 MED FILL — SERTRALINE HCL 50MG TABS: 50 mg | 90 days supply | Qty: 90 | Fill #0 | Status: AC

## 2019-02-17 MED FILL — GUANFACINE HCL ER 4MG TB24: 4 mg | 90 days supply | Qty: 90 | Fill #0 | Status: AC

## 2019-03-13 DIAGNOSIS — T781XXA Other adverse food reactions, not elsewhere classified, initial encounter: Secondary | ICD-10-CM

## 2019-03-13 NOTE — ED Notes (Signed)
Pt states she had some milk about an hour ago at her job. States she is allergic and accidentally had some. Pt is complaining of itching all over and some SOB and throat feels tight. States it is hard to swallow. No hives seen in triage. States she took 27 of benadryl.

## 2019-03-13 NOTE — ED Provider Notes (Signed)
Patient is a 18 year old female with a history of prior allergic reactions.  Brought to the emergency department tonight by her father.  She was at work and ate some soup that apparently had milk in it.  She was not aware to been cooked with dairy.  She started itching in her face.  Then her throat felt tight.  She had mild shortness of breath.  She took Benadryl 75 mg prior to arrival here.    The history is provided by the patient.   Allergic Reaction    This is a new problem. The current episode started 1 to 2 hours ago. The problem has been gradually improving. Associated symptoms include shortness of breath. Pertinent negatives include no nausea.        Past Medical History:   Diagnosis Date   ??? ADHD     managed with meds   ??? Autism        Past Surgical History:   Procedure Laterality Date   ??? COLONOSCOPY N/A 01/14/2019    COLONOSCOPY/  BMI 21 PT IS AUTISTIC performed by Flint Melter, MD at [facility] ENDOSCOPY   ??? HX OTHER SURGICAL      EGD         No family history on file.    Social History     Socioeconomic History   ??? Marital status: SINGLE     Spouse name: Not on file   ??? Number of children: Not on file   ??? Years of education: Not on file   ??? Highest education level: Not on file   Occupational History   ??? Not on file   Social Needs   ??? Financial resource strain: Not on file   ??? Food insecurity     Worry: Not on file     Inability: Not on file   ??? Transportation needs     Medical: Not on file     Non-medical: Not on file   Tobacco Use   ??? Smoking status: Never Smoker   ??? Smokeless tobacco: Never Used   Substance and Sexual Activity   ??? Alcohol use: Not Currently   ??? Drug use: Not on file   ??? Sexual activity: Not on file   Lifestyle   ??? Physical activity     Days per week: Not on file     Minutes per session: Not on file   ??? Stress: Not on file   Relationships   ??? Social Product manager on phone: Not on file     Gets together: Not on file     Attends religious service: Not on file     Active member  of club or organization: Not on file     Attends meetings of clubs or organizations: Not on file     Relationship status: Not on file   ??? Intimate partner violence     Fear of current or ex partner: Not on file     Emotionally abused: Not on file     Physically abused: Not on file     Forced sexual activity: Not on file   Other Topics Concern   ??? Not on file   Social History Narrative   ??? Not on file         ALLERGIES: Milk; Milk containing products; Cat dander; and Dog dander    Review of Systems   Constitutional: Negative for chills, fatigue and fever.   HENT: Negative for congestion,  rhinorrhea and sore throat.    Eyes: Negative for pain, discharge and visual disturbance.   Respiratory: Positive for shortness of breath. Negative for cough.    Cardiovascular: Negative for chest pain and palpitations.   Gastrointestinal: Negative for abdominal pain, diarrhea and nausea.   Endocrine: Negative for polydipsia and polyuria.   Genitourinary: Negative for dysuria, frequency and urgency.   Musculoskeletal: Negative for back pain and neck pain.   Skin: Negative for rash.   Neurological: Negative for seizures, syncope and weakness.   Hematological: Negative.        Vitals:    03/13/19 2101   BP: 121/76   Pulse: 90   Resp: 24   Temp: 98 ??F (36.7 ??C)   SpO2: 98%   Weight: 56.2 kg (124 lb)   Height: 5\' 4"  (1.626 m)            Physical Exam  Vitals signs and nursing note reviewed.   Constitutional:       Appearance: She is well-developed.   HENT:      Head: Normocephalic and atraumatic.      Mouth/Throat:      Mouth: Mucous membranes are moist.      Pharynx: Oropharynx is clear. No oropharyngeal exudate.   Eyes:      Conjunctiva/sclera: Conjunctivae normal.      Pupils: Pupils are equal, round, and reactive to light.   Neck:      Musculoskeletal: Normal range of motion and neck supple.   Cardiovascular:      Rate and Rhythm: Normal rate and regular rhythm.   Pulmonary:      Effort: Pulmonary effort is normal.      Breath  sounds: Normal breath sounds. No stridor. No wheezing.   Abdominal:      Palpations: Abdomen is soft.      Tenderness: There is no abdominal tenderness. There is no guarding or rebound.   Musculoskeletal: Normal range of motion.         General: No deformity.   Lymphadenopathy:      Cervical: No cervical adenopathy.   Skin:     General: Skin is warm and dry.      Capillary Refill: Capillary refill takes less than 2 seconds.      Findings: No rash.      Comments: No hives   Neurological:      Mental Status: She is alert and oriented to person, place, and time.      GCS: GCS eye subscore is 4. GCS verbal subscore is 5. GCS motor subscore is 6.      Cranial Nerves: No cranial nerve deficit.      Sensory: No sensory deficit.   Psychiatric:         Mood and Affect: Mood is anxious.          MDM  Number of Diagnoses or Management Options  Diagnosis management comments: I wore appropriate PPE throughout this patient's ED visit. Abbe Amsterdam, MD, 9:32 PM    Patient is already taking adequate dose of Benadryl states she is starting to feel a bit better.  Will dose with IV Solu-Medrol and IV Pepcid.  Will observe in the ER.    10:33 PM  Patient feeling much better.  Will discharge to home.  States that she has prednisone at home and Benadryl and her EpiPen.  She will follow-up with her doctor.    Voice dictation software was used during the making of this note.  This software is not perfect and grammatical and other typographical errors may be present.  This note has been proofread, but may still contain errors.  Abbe Amsterdam, MD; 03/13/2019 @10 :34 PM   ===================================================================         Amount and/or Complexity of Data Reviewed  Review and summarize past medical records: yes    Risk of Complications, Morbidity, and/or Mortality  Presenting problems: moderate  Diagnostic procedures: minimal  Management options: low    Patient Progress  Patient progress: improved          Procedures

## 2019-03-13 NOTE — ED Notes (Signed)
I have reviewed discharge instructions with the patient.  The patient verbalized understanding.    Patient left ED via Discharge Method: ambulatory to Home with family.  Opportunity for questions and clarification provided.       Patient given 0 scripts.

## 2019-03-13 NOTE — ED Notes (Signed)
I have reviewed discharge instructions with the patient.  The patient verbalized understanding.    Patient left ED via Discharge Method: ambulatory to Home with family.    Opportunity for questions and clarification provided.       Patient given 0 scripts.

## 2019-03-13 NOTE — ED Provider Notes (Signed)
Patient is a 18 year old female with a history of prior allergic reactions.  Brought to the emergency department tonight by her father.  She was at work and ate some soup that apparently had milk in it.  She was not aware to been cooked with dairy.  She started itching in her face.  Then her throat felt tight.  She had mild shortness of breath.  She took Benadryl 75 mg prior to arrival here.    The history is provided by the patient.   Allergic Reaction    This is a new problem. The current episode started 1 to 2 hours ago. The problem has been gradually improving. Associated symptoms include shortness of breath. Pertinent negatives include no nausea.        Past Medical History:   Diagnosis Date   ??? ADHD     managed with meds   ??? Autism        Past Surgical History:   Procedure Laterality Date   ??? COLONOSCOPY N/A 01/14/2019    COLONOSCOPY/  BMI 21 PT IS AUTISTIC performed by Flint Melter, MD at [facility] ENDOSCOPY   ??? HX OTHER SURGICAL      EGD         No family history on file.    Social History     Socioeconomic History   ??? Marital status: SINGLE     Spouse name: Not on file   ??? Number of children: Not on file   ??? Years of education: Not on file   ??? Highest education level: Not on file   Occupational History   ??? Not on file   Social Needs   ??? Financial resource strain: Not on file   ??? Food insecurity     Worry: Not on file     Inability: Not on file   ??? Transportation needs     Medical: Not on file     Non-medical: Not on file   Tobacco Use   ??? Smoking status: Never Smoker   ??? Smokeless tobacco: Never Used   Substance and Sexual Activity   ??? Alcohol use: Not Currently   ??? Drug use: Not on file   ??? Sexual activity: Not on file   Lifestyle   ??? Physical activity     Days per week: Not on file     Minutes per session: Not on file   ??? Stress: Not on file   Relationships   ??? Social Product manager on phone: Not on file     Gets together: Not on file     Attends religious service: Not on file      Active member of club or organization: Not on file     Attends meetings of clubs or organizations: Not on file     Relationship status: Not on file   ??? Intimate partner violence     Fear of current or ex partner: Not on file     Emotionally abused: Not on file     Physically abused: Not on file     Forced sexual activity: Not on file   Other Topics Concern   ??? Not on file   Social History Narrative   ??? Not on file         ALLERGIES: Milk; Milk containing products; Cat dander; and Dog dander    Review of Systems   Constitutional: Negative for chills, fatigue and fever.   HENT: Negative for congestion,  rhinorrhea and sore throat.    Eyes: Negative for pain, discharge and visual disturbance.   Respiratory: Positive for shortness of breath. Negative for cough.    Cardiovascular: Negative for chest pain and palpitations.   Gastrointestinal: Negative for abdominal pain, diarrhea and nausea.   Endocrine: Negative for polydipsia and polyuria.   Genitourinary: Negative for dysuria, frequency and urgency.   Musculoskeletal: Negative for back pain and neck pain.   Skin: Negative for rash.   Neurological: Negative for seizures, syncope and weakness.   Hematological: Negative.        Vitals:    03/13/19 2101   BP: 121/76   Pulse: 90   Resp: 24   Temp: 98 ??F (36.7 ??C)   SpO2: 98%   Weight: 56.2 kg (124 lb)   Height: 5\' 4"  (1.626 m)            Physical Exam  Vitals signs and nursing note reviewed.   Constitutional:       Appearance: She is well-developed.   HENT:      Head: Normocephalic and atraumatic.      Mouth/Throat:      Mouth: Mucous membranes are moist.      Pharynx: Oropharynx is clear. No oropharyngeal exudate.   Eyes:      Conjunctiva/sclera: Conjunctivae normal.      Pupils: Pupils are equal, round, and reactive to light.   Neck:      Musculoskeletal: Normal range of motion and neck supple.   Cardiovascular:      Rate and Rhythm: Normal rate and regular rhythm.   Pulmonary:      Effort: Pulmonary effort is normal.       Breath sounds: Normal breath sounds. No stridor. No wheezing.   Abdominal:      Palpations: Abdomen is soft.      Tenderness: There is no abdominal tenderness. There is no guarding or rebound.   Musculoskeletal: Normal range of motion.         General: No deformity.   Lymphadenopathy:      Cervical: No cervical adenopathy.   Skin:     General: Skin is warm and dry.      Capillary Refill: Capillary refill takes less than 2 seconds.      Findings: No rash.      Comments: No hives   Neurological:      Mental Status: She is alert and oriented to person, place, and time.      GCS: GCS eye subscore is 4. GCS verbal subscore is 5. GCS motor subscore is 6.      Cranial Nerves: No cranial nerve deficit.      Sensory: No sensory deficit.   Psychiatric:         Mood and Affect: Mood is anxious.          MDM  Number of Diagnoses or Management Options  Diagnosis management comments: I wore appropriate PPE throughout this patient's ED visit. Abbe Amsterdam, MD, 9:32 PM    Patient is already taking adequate dose of Benadryl states she is starting to feel a bit better.  Will dose with IV Solu-Medrol and IV Pepcid.  Will observe in the ER.    10:33 PM  Patient feeling much better.  Will discharge to home.  States that she has prednisone at home and Benadryl and her EpiPen.  She will follow-up with her doctor.    Voice dictation software was used during the making of this note.  This software is not perfect and grammatical and other typographical errors may be present.  This note has been proofread, but may still contain errors.  Abbe Amsterdam, MD; 03/13/2019 @10 :34 PM   ===================================================================         Amount and/or Complexity of Data Reviewed  Review and summarize past medical records: yes    Risk of Complications, Morbidity, and/or Mortality  Presenting problems: moderate  Diagnostic procedures: minimal  Management options: low    Patient Progress  Patient progress: improved          Procedures

## 2019-03-13 NOTE — ED Triage Notes (Signed)
Pt states she had some milk about an hour ago at her job. States she is allergic and accidentally had some. Pt is complaining of itching all over and some SOB and throat feels tight. States it is hard to swallow. No hives seen in triage. States she took 67 of benadryl.

## 2019-03-14 ENCOUNTER — Inpatient Hospital Stay
Admit: 2019-03-14 | Discharge: 2019-03-14 | Disposition: A | Discharge status: Home / Self Care | Payer: PRIVATE HEALTH INSURANCE | Attending: Emergency Medicine

## 2019-03-14 MED ORDER — FAMOTIDINE (PF) 20 MG/2 ML IV
20 mg/2 mL | INTRAVENOUS | Status: DC
Start: 2019-03-14 — End: 2019-03-14

## 2019-03-14 MED ORDER — FAMOTIDINE (PF) 20 MG/2 ML IV
20 mg/2 mL | INTRAVENOUS | Status: AC
Start: 2019-03-14 — End: 2019-03-13
  Administered 2019-03-14: 02:00:00 via INTRAVENOUS

## 2019-03-14 MED ORDER — METHYLPREDNISOLONE (PF) 125 MG/2 ML IJ SOLR
125 mg/2 mL | Freq: Once | INTRAMUSCULAR | Status: AC
Start: 2019-03-14 — End: 2019-03-13
  Administered 2019-03-14: 02:00:00 via INTRAVENOUS

## 2019-03-14 MED ORDER — METHYLPREDNISOLONE (PF) 125 MG/2 ML IJ SOLR
125 mg/2 mL | INTRAMUSCULAR | Status: DC
Start: 2019-03-14 — End: 2019-03-14

## 2019-03-14 MED FILL — FAMOTIDINE (PF) 20 MG/2 ML IV: 20 mg/2 mL | INTRAVENOUS | Qty: 2

## 2019-03-14 MED FILL — SOLU-MEDROL (PF) 125 MG/2 ML SOLUTION FOR INJECTION: 125 mg/2 mL | INTRAMUSCULAR | Qty: 2

## 2019-03-14 NOTE — Progress Notes (Signed)
Verified DOB and address for HIPAA security.  Introduced [employer] for patient.   Patient's mother does not identify any Care Management needs at this time and declines services.      CM spoke with patient's mother Dr. [name], who reports patient is doing well. No further problems or complications. Dr. Wynetta Emery states patient with hx of allergic reactions and has all necessary medication and education. Declined CM services at this time but was appreciative of call.

## 2019-03-23 ENCOUNTER — Inpatient Hospital Stay: Admit: 2019-03-23 | Discharge: 2019-03-23 | Payer: PRIVATE HEALTH INSURANCE | Attending: Emergency Medicine

## 2019-03-23 DIAGNOSIS — T7840XA Allergy, unspecified, initial encounter: Secondary | ICD-10-CM

## 2019-03-23 NOTE — Telephone Encounter (Signed)
Reason for Disposition  ??? Wheezing, stridor, hoarseness, or difficulty breathing    Answer Assessment - Initial Assessment Questions  1. 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 Had something today with milk    2. RESPIRATORY STATUS: "Are you having difficulty breathing?"  (e.g., yes/no, wheezing, unable to complete a sentence)   Was in respiratory distress, sats between 85-92%    3. SWALLOWING: "Can you swallow?" (e.g., yes/no, food, fluid, saliva)      Hard to swallow    4. VASCULAR STATUS: "Are you feeling weak?" If so, ask: "Can you stand and walk normally?"  Doesn't feel good    5. ONSET: "When did the reaction start?" (Minutes or hours ago)   Occurred today    6. SUBSTANCE: "What are you reacting to?" "When did the contact occur?"   Milk allergy    7. PREVIOUS REACTION: "Have you ever reacted to it before?" If so, ask: "What happened that time?"    8. EPINEPHRINE: "Do you have an epinephrine autoinjector (e.g., EpiPen)?"      Gave dose of epi    Protocols used: ANAPHYLAXIS-ADULT-OH    Mother calling. Pt has allergy to milk. Had something today with milk in it. Had a reaction-difficulty breathing, sats dropping to 85-92%, hives, difficulty swallowing. Gave a dose of epi and is now on their way to ED. Will be going to [facility].

## 2019-03-23 NOTE — ED Provider Notes (Signed)
HPI     Past Medical History:   Diagnosis Date   ??? ADHD     managed with meds   ??? Autism        Past Surgical History:   Procedure Laterality Date   ??? COLONOSCOPY N/A 01/14/2019    COLONOSCOPY/  BMI 21 PT IS AUTISTIC performed by Flint Melter, MD at [facility] ENDOSCOPY   ??? HX OTHER SURGICAL      EGD         No family history on file.    Social History     Socioeconomic History   ??? Marital status: SINGLE     Spouse name: Not on file   ??? Number of children: Not on file   ??? Years of education: Not on file   ??? Highest education level: Not on file   Occupational History   ??? Not on file   Social Needs   ??? Financial resource strain: Not on file   ??? Food insecurity     Worry: Not on file     Inability: Not on file   ??? Transportation needs     Medical: Not on file     Non-medical: Not on file   Tobacco Use   ??? Smoking status: Never Smoker   ??? Smokeless tobacco: Never Used   Substance and Sexual Activity   ??? Alcohol use: Not Currently   ??? Drug use: Not on file   ??? Sexual activity: Not on file   Lifestyle   ??? Physical activity     Days per week: Not on file     Minutes per session: Not on file   ??? Stress: Not on file   Relationships   ??? Social Product manager on phone: Not on file     Gets together: Not on file     Attends religious service: Not on file     Active member of club or organization: Not on file     Attends meetings of clubs or organizations: Not on file     Relationship status: Not on file   ??? Intimate partner violence     Fear of current or ex partner: Not on file     Emotionally abused: Not on file     Physically abused: Not on file     Forced sexual activity: Not on file   Other Topics Concern   ??? Not on file   Social History Narrative   ??? Not on file         ALLERGIES: Milk; Milk containing products; Cat dander; and Dog dander    Review of Systems    There were no vitals filed for this visit.         Physical Exam     MDM       Procedures

## 2019-03-23 NOTE — ED Provider Notes (Signed)
HPI     Past Medical History:   Diagnosis Date   ??? ADHD     managed with meds   ??? Autism        Past Surgical History:   Procedure Laterality Date   ??? COLONOSCOPY N/A 01/14/2019    COLONOSCOPY/  BMI 21 PT IS AUTISTIC performed by Flint Melter, MD at [facility] ENDOSCOPY   ??? HX OTHER SURGICAL      EGD         No family history on file.    Social History     Socioeconomic History   ??? Marital status: SINGLE     Spouse name: Not on file   ??? Number of children: Not on file   ??? Years of education: Not on file   ??? Highest education level: Not on file   Occupational History   ??? Not on file   Social Needs   ??? Financial resource strain: Not on file   ??? Food insecurity     Worry: Not on file     Inability: Not on file   ??? Transportation needs     Medical: Not on file     Non-medical: Not on file   Tobacco Use   ??? Smoking status: Never Smoker   ??? Smokeless tobacco: Never Used   Substance and Sexual Activity   ??? Alcohol use: Not Currently   ??? Drug use: Not on file   ??? Sexual activity: Not on file   Lifestyle   ??? Physical activity     Days per week: Not on file     Minutes per session: Not on file   ??? Stress: Not on file   Relationships   ??? Social Product manager on phone: Not on file     Gets together: Not on file     Attends religious service: Not on file     Active member of club or organization: Not on file     Attends meetings of clubs or organizations: Not on file     Relationship status: Not on file   ??? Intimate partner violence     Fear of current or ex partner: Not on file     Emotionally abused: Not on file     Physically abused: Not on file     Forced sexual activity: Not on file   Other Topics Concern   ??? Not on file   Social History Narrative   ??? Not on file         ALLERGIES: Milk; Milk containing products; Cat dander; and Dog dander    Review of Systems    There were no vitals filed for this visit.         Physical Exam     MDM       Procedures

## 2019-05-24 MED FILL — GUANFACINE HCL ER 4MG TB24: 4 mg | 90 days supply | Qty: 90 | Fill #1 | Status: AC

## 2019-07-01 DIAGNOSIS — T7840XA Allergy, unspecified, initial encounter: Secondary | ICD-10-CM

## 2019-07-01 NOTE — ED Triage Notes (Addendum)
EMS: Pt arriving from home via [facility]. Called out for allergic reaction. On EMS arrival pt was lethargic and struggling to breath. Pt was lying in the floor with audible wheezing, pt had significant swelling in face and rash allover body. Prior to EMS arrival pt self administered 75mg  of benadryl and her home EPI pen and 40mg  of prednisone. Given en route via EMS: 4mg  zofran,0.5 EPI 1:1 IM, 50 mg benadryl IM, 5mg  Albuterol nebulized, 125mg  solumedrol, and 1L NS r/t hypotension. 116/62, 68 HR, O2 98% on 3L NC (92% on room air), BGL 256. 18g RHand.    Dr Servando Snare present during triage. Pt airway patent and lung sounds clear. Pt brother and godmother present at bedside. Pt alert and oriented during triage.

## 2019-07-01 NOTE — ED Provider Notes (Signed)
Robin Mendoza is a 19 y.o. female seen on 07/02/2019 in the [facility] EMERGENCY DEPT in room ER05/05.    Chief Complaint   Patient presents with   ??? Allergic Reaction     HPI: 19 year old female with multiple significant allergies presents to the emergency department with complaints of allergic reaction.  Patient has a significant milk allergy and ate something tonight that had milk anything gradients.  Patient shortly after began having difficulty swallowing breathing and gave herself a shot with her EpiPen.  She also took  75 mg of Benadryl and 40 mg of prednisone as is the protocol for her allergic reactions.  Upon arrival of EMS, patient with continued symptoms and an associated near syncopal episode.  EMS gave an additional 0.5 mg of epinephrine, 125 mg of Solu-Medrol and 50 mg of Benadryl.  Upon arrival, patient states she "does not feel well."  She is not having any additional difficulty breathing or swallowing at this time.  She feels jittery and generalized unwell.  History and review of systems is somewhat limited secondary to patient's autism however patient is interactive and answering questions without difficulty.    Historian: Patient/Family/EMS    REVIEW OF SYSTEMS     Review of Systems   Constitutional: Positive for fatigue.   HENT: Positive for facial swelling and trouble swallowing.    Cardiovascular: Positive for palpitations. Negative for chest pain and leg swelling.   Gastrointestinal: Positive for nausea.   Genitourinary: Negative.    Musculoskeletal: Negative.    Skin: Positive for rash (Diffuse erythema).   Neurological: Positive for light-headedness.   Hematological: Negative.    Psychiatric/Behavioral: Negative.    All other systems reviewed and are negative.      PAST MEDICAL HISTORY     Past Medical History:   Diagnosis Date   ??? ADHD     managed with meds   ??? Autism      Past Surgical History:   Procedure Laterality Date   ???  COLONOSCOPY N/A 01/14/2019    COLONOSCOPY/  BMI 21 PT IS AUTISTIC performed by Flint Melter, MD at [facility] ENDOSCOPY   ??? HX OTHER SURGICAL      EGD     Social History     Socioeconomic History   ??? Marital status: SINGLE     Spouse name: Not on file   ??? Number of children: Not on file   ??? Years of education: Not on file   ??? Highest education level: Not on file   Tobacco Use   ??? Smoking status: Never Smoker   ??? Smokeless tobacco: Never Used   Substance and Sexual Activity   ??? Alcohol use: Not Currently     Prior to Admission Medications   Prescriptions Last Dose Informant Patient Reported? Taking?   guanfacine HCl (INTUNIV ER PO)   Yes No   Sig: Take  by mouth.   lisdexamfetamine dimesylate (VYVANSE PO)   Yes No   Sig: Take  by mouth.   sertraline HCl (SERTRALINE PO)   Yes No   Sig: Take  by mouth.      Facility-Administered Medications: None     Allergies   Allergen Reactions   ??? Milk Anaphylaxis   ??? Milk Containing Products Anaphylaxis   ??? Cat Dander Itching   ??? Dog Dander Itching        PHYSICAL EXAM       Vitals:  07/01/19 2118 07/01/19 2218 07/01/19 2239 07/01/19 2318   BP: (!) 117/58 113/65 106/60 (!) 104/56   Pulse: 100 93 (!) 107 100   Resp: 24 17 27 11    Temp:       SpO2: 99% 97% 96% 97%    Vital signs were reviewed.     Physical Exam  Vitals signs and nursing note reviewed.   Constitutional:       General: She is not in acute distress.     Appearance: Normal appearance. She is not ill-appearing or toxic-appearing.   HENT:      Head: Normocephalic and atraumatic.      Comments: Mild facial swelling and erythema bilateral cheeks     Nose: No congestion or rhinorrhea.      Mouth/Throat:      Mouth: Mucous membranes are dry.      Pharynx: No oropharyngeal exudate or posterior oropharyngeal erythema.      Comments: No posterior pharyngeal swelling  Eyes:      Extraocular Movements: Extraocular movements intact.      Pupils: Pupils are equal, round, and reactive to light.   Neck:      Musculoskeletal:  Normal range of motion.   Cardiovascular:      Rate and Rhythm: Normal rate and regular rhythm.      Pulses: Normal pulses.      Heart sounds: Normal heart sounds.   Pulmonary:      Effort: Pulmonary effort is normal.   Abdominal:      Palpations: Abdomen is soft.      Tenderness: There is no abdominal tenderness.   Musculoskeletal: Normal range of motion.   Skin:     General: Skin is warm and dry.      Findings: Erythema and rash (Diffuse erythema and mild urticaria) present.   Neurological:      General: No focal deficit present.      Mental Status: She is alert and oriented to person, place, and time.   Psychiatric:         Judgment: Judgment normal.          MEDICAL DECISION MAKING     ED Course:    No results found for this or any previous visit (from the past 8 hour(s)).  No results found.    MDM  Number of Diagnoses or Management Options  Allergic reaction, initial encounter  Diagnosis management comments: 19 year old female presented emergency department with allergic reaction after being exposed to the milk products.  Patient had 2 doses of epinephrine, steroids and Benadryl prior to arrival in the emergency department.  Patient given a liter of normal saline and Pepcid as adjunct therapy for her reaction.  Patient observed in the emergency department for 4 hours without recurrence of symptoms and with continued improvement of her symptoms.  Patient is completely asymptomatic at this time.  Will be discharged home and will return to the emergency department for any concerns.    Patient Progress  Patient progress: improved        Disposition: Discharged  Diagnosis:     ICD-10-CM ICD-9-CM   1. Allergic reaction, initial encounter  T78.40XA 995.3     ____________________________________________________________________  A portion of this note was generated using voice recognition dictation software. While the note has been reviewed for accuracy, please note certain words and phrases may not be transcribed as  intended and some grammatical and/or typographical errors may be present.

## 2019-07-01 NOTE — ED Notes (Signed)
After consulting with Dr. Skip Estimable, pt provided with cup of water

## 2019-07-01 NOTE — ED Notes (Signed)
EMS: Pt arriving from home via [facility]. Called out for allergic reaction. On EMS arrival pt was lethargic and struggling to breath. Pt was lying in the floor with audible wheezing, pt had significant swelling in face and rash allover body. Prior to EMS arrival pt self administered 75mg  of benadryl and her home EPI pen and 40mg  of prednisone. Given en route via EMS: 4mg  zofran,0.5 EPI 1:1 IM, 50 mg benadryl IM, 5mg  Albuterol nebulized, 125mg  solumedrol, and 1L NS r/t hypotension. 116/62, 68 HR, O2 98% on 3L NC (92% on room air), BGL 256. 18g RHand.    Dr Servando Snare present during triage. Pt airway patent and lung sounds clear. Pt brother and godmother present at bedside. Pt alert and oriented during triage.

## 2019-07-01 NOTE — ED Notes (Signed)
After consulting with Dr. Servando Snare, pt provided with cup of water

## 2019-07-01 NOTE — ED Provider Notes (Signed)
[name] is a 19 y.o. female seen on 07/02/2019 in the [facility] EMERGENCY DEPT in room ER05/05.    Chief Complaint   Patient presents with   ??? Allergic Reaction     HPI: 19 year old female with multiple significant allergies presents to the emergency department with complaints of allergic reaction.  Patient has a significant milk allergy and ate something tonight that had milk anything gradients.  Patient shortly after began having difficulty swallowing breathing and gave herself a shot with her EpiPen.  She also took  75 mg of Benadryl and 40 mg of prednisone as is the protocol for her allergic reactions.  Upon arrival of EMS, patient with continued symptoms and an associated near syncopal episode.  EMS gave an additional 0.5 mg of epinephrine, 125 mg of Solu-Medrol and 50 mg of Benadryl.  Upon arrival, patient states she "does not feel well."  She is not having any additional difficulty breathing or swallowing at this time.  She feels jittery and generalized unwell.  History and review of systems is somewhat limited secondary to patient's autism however patient is interactive and answering questions without difficulty.    Historian: Patient/Family/EMS    REVIEW OF SYSTEMS     Review of Systems   Constitutional: Positive for fatigue.   HENT: Positive for facial swelling and trouble swallowing.    Cardiovascular: Positive for palpitations. Negative for chest pain and leg swelling.   Gastrointestinal: Positive for nausea.   Genitourinary: Negative.    Musculoskeletal: Negative.    Skin: Positive for rash (Diffuse erythema).   Neurological: Positive for light-headedness.   Hematological: Negative.    Psychiatric/Behavioral: Negative.    All other systems reviewed and are negative.      PAST MEDICAL HISTORY     Past Medical History:   Diagnosis Date   ??? ADHD     managed with meds   ??? Autism      Past Surgical History:   Procedure Laterality Date    ??? COLONOSCOPY N/A 01/14/2019    COLONOSCOPY/  BMI 21 PT IS AUTISTIC performed by Flint Melter, MD at [facility] ENDOSCOPY   ??? HX OTHER SURGICAL      EGD     Social History     Socioeconomic History   ??? Marital status: SINGLE     Spouse name: Not on file   ??? Number of children: Not on file   ??? Years of education: Not on file   ??? Highest education level: Not on file   Tobacco Use   ??? Smoking status: Never Smoker   ??? Smokeless tobacco: Never Used   Substance and Sexual Activity   ??? Alcohol use: Not Currently     Prior to Admission Medications   Prescriptions Last Dose Informant Patient Reported? Taking?   guanfacine HCl (INTUNIV ER PO)   Yes No   Sig: Take  by mouth.   lisdexamfetamine dimesylate (VYVANSE PO)   Yes No   Sig: Take  by mouth.   sertraline HCl (SERTRALINE PO)   Yes No   Sig: Take  by mouth.      Facility-Administered Medications: None     Allergies   Allergen Reactions   ??? Milk Anaphylaxis   ??? Milk Containing Products Anaphylaxis   ??? Cat Dander Itching   ??? Dog Dander Itching        PHYSICAL EXAM       Vitals:  07/01/19 2118 07/01/19 2218 07/01/19 2239 07/01/19 2318   BP: (!) 117/58 113/65 106/60 (!) 104/56   Pulse: 100 93 (!) 107 100   Resp: 24 17 27 11    Temp:       SpO2: 99% 97% 96% 97%    Vital signs were reviewed.     Physical Exam  Vitals signs and nursing note reviewed.   Constitutional:       General: She is not in acute distress.     Appearance: Normal appearance. She is not ill-appearing or toxic-appearing.   HENT:      Head: Normocephalic and atraumatic.      Comments: Mild facial swelling and erythema bilateral cheeks     Nose: No congestion or rhinorrhea.      Mouth/Throat:      Mouth: Mucous membranes are dry.      Pharynx: No oropharyngeal exudate or posterior oropharyngeal erythema.      Comments: No posterior pharyngeal swelling  Eyes:      Extraocular Movements: Extraocular movements intact.      Pupils: Pupils are equal, round, and reactive to light.   Neck:       Musculoskeletal: Normal range of motion.   Cardiovascular:      Rate and Rhythm: Normal rate and regular rhythm.      Pulses: Normal pulses.      Heart sounds: Normal heart sounds.   Pulmonary:      Effort: Pulmonary effort is normal.   Abdominal:      Palpations: Abdomen is soft.      Tenderness: There is no abdominal tenderness.   Musculoskeletal: Normal range of motion.   Skin:     General: Skin is warm and dry.      Findings: Erythema and rash (Diffuse erythema and mild urticaria) present.   Neurological:      General: No focal deficit present.      Mental Status: She is alert and oriented to person, place, and time.   Psychiatric:         Judgment: Judgment normal.          MEDICAL DECISION MAKING     ED Course:    No results found for this or any previous visit (from the past 8 hour(s)).  No results found.    MDM  Number of Diagnoses or Management Options  Allergic reaction, initial encounter  Diagnosis management comments: 19 year old female presented emergency department with allergic reaction after being exposed to the milk products.  Patient had 2 doses of epinephrine, steroids and Benadryl prior to arrival in the emergency department.  Patient given a liter of normal saline and Pepcid as adjunct therapy for her reaction.  Patient observed in the emergency department for 4 hours without recurrence of symptoms and with continued improvement of her symptoms.  Patient is completely asymptomatic at this time.  Will be discharged home and will return to the emergency department for any concerns.    Patient Progress  Patient progress: improved        Disposition: Discharged  Diagnosis:     ICD-10-CM ICD-9-CM   1. Allergic reaction, initial encounter  T78.40XA 995.3     ____________________________________________________________________   A portion of this note was generated using voice recognition dictation software. While the note has been reviewed for accuracy, please note certain words and phrases may not be transcribed as intended and some grammatical and/or typographical errors may be present.

## 2019-07-02 ENCOUNTER — Inpatient Hospital Stay
Admit: 2019-07-02 | Discharge: 2019-07-02 | Disposition: A | Discharge status: Home / Self Care | Payer: PRIVATE HEALTH INSURANCE | Attending: Emergency Medicine

## 2019-07-02 DIAGNOSIS — T7840XA Allergy, unspecified, initial encounter: Secondary | ICD-10-CM

## 2019-07-02 LAB — GLUCOSE, POC: Glucose (POC): 117 mg/dL — ABNORMAL HIGH (ref 65–100)

## 2019-07-02 LAB — POCT GLUCOSE: POC Glucose: 117 mg/dL — ABNORMAL HIGH (ref 65–100)

## 2019-07-02 MED ORDER — FAMOTIDINE 20 MG TAB
20 mg | ORAL | Status: AC
Start: 2019-07-02 — End: 2019-07-02
  Administered 2019-07-02: 18:00:00 via ORAL

## 2019-07-02 MED ORDER — LORAZEPAM 2 MG/ML IJ SOLN
2 mg/mL | INTRAMUSCULAR | Status: AC
Start: 2019-07-02 — End: 2019-07-02
  Administered 2019-07-02: 18:00:00 via INTRAVENOUS

## 2019-07-02 MED ORDER — SODIUM CHLORIDE 0.9% BOLUS IV
0.9 % | Freq: Once | INTRAVENOUS | Status: AC
Start: 2019-07-02 — End: 2019-07-01
  Administered 2019-07-02: 02:00:00 via INTRAVENOUS

## 2019-07-02 MED ORDER — SODIUM CHLORIDE 0.9% BOLUS IV
0.9 % | Freq: Once | INTRAVENOUS | Status: AC
Start: 2019-07-02 — End: 2019-07-02
  Administered 2019-07-02: 18:00:00 via INTRAVENOUS

## 2019-07-02 MED ORDER — LORATADINE 10 MG TAB
10 mg | ORAL | Status: AC
Start: 2019-07-02 — End: 2019-07-02
  Administered 2019-07-02: 23:00:00 via ORAL

## 2019-07-02 MED ORDER — EPINEPHRINE 0.3 MG/0.3 ML IM PEN INJECTOR
0.3 mg/ mL | Freq: Once | INTRAMUSCULAR | 0 refills | Status: AC | PRN
Start: 2019-07-02 — End: 2019-07-02
  Filled 2019-07-21: qty 6, 6d supply, fill #1

## 2019-07-02 MED ORDER — PREDNISONE 20 MG TAB
20 mg | ORAL_TABLET | Freq: Two times a day (BID) | ORAL | 0 refills | Status: AC
Start: 2019-07-02 — End: 2019-07-07

## 2019-07-02 MED ORDER — METHYLPREDNISOLONE (PF) 125 MG/2 ML IJ SOLR
125 mg/2 mL | Freq: Once | INTRAMUSCULAR | Status: AC
Start: 2019-07-02 — End: 2019-07-02
  Administered 2019-07-02: 18:00:00 via INTRAVENOUS

## 2019-07-02 MED ORDER — DIPHENHYDRAMINE HCL 50 MG/ML IJ SOLN
50 mg/mL | INTRAMUSCULAR | Status: AC
Start: 2019-07-02 — End: 2019-07-02
  Administered 2019-07-02: 18:00:00 via INTRAVENOUS

## 2019-07-02 MED ORDER — ONDANSETRON (PF) 4 MG/2 ML INJECTION
4 mg/2 mL | INTRAMUSCULAR | Status: AC
Start: 2019-07-02 — End: 2019-07-02
  Administered 2019-07-02: 19:00:00 via INTRAVENOUS

## 2019-07-02 MED ORDER — SODIUM CHLORIDE 0.9 % INJECTION
20 mg/2 mL | INTRAMUSCULAR | Status: AC
Start: 2019-07-02 — End: 2019-07-01
  Administered 2019-07-02: 02:00:00 via INTRAVENOUS

## 2019-07-02 MED FILL — SOLU-MEDROL (PF) 125 MG/2 ML SOLUTION FOR INJECTION: 125 mg/2 mL | INTRAMUSCULAR | Qty: 2

## 2019-07-02 MED FILL — DIPHENHYDRAMINE HCL 50 MG/ML IJ SOLN: 50 mg/mL | INTRAMUSCULAR | Qty: 1

## 2019-07-02 MED FILL — FAMOTIDINE (PF) 20 MG/2 ML IV: 20 mg/2 mL | INTRAVENOUS | Qty: 2

## 2019-07-02 MED FILL — LORATADINE 10 MG TAB: 10 mg | ORAL | Qty: 1

## 2019-07-02 MED FILL — FAMOTIDINE 20 MG TAB: 20 mg | ORAL | Qty: 2

## 2019-07-02 MED FILL — LORAZEPAM 2 MG/ML IJ SOLN: 2 mg/mL | INTRAMUSCULAR | Qty: 1

## 2019-07-02 MED FILL — ONDANSETRON (PF) 4 MG/2 ML INJECTION: 4 mg/2 mL | INTRAMUSCULAR | Qty: 2

## 2019-07-02 NOTE — ED Notes (Signed)
Patient brother entered room and provided additional information.  Reports that he administered additional dose of epi IM to patient at home last night, as well as, Benadryl 50mg  IM at home last night post hospital visit.

## 2019-07-02 NOTE — ED Notes (Signed)
I have reviewed discharge instructions with the patient.  The patient verbalized understanding.    Patient left ED via Discharge Method: ambulatory to Home .    Opportunity for questions and clarification provided.       Patient given 0 scripts.         To continue your aftercare when you leave the hospital, you may receive an automated call from our care team to check in on how you are doing.  This is a free service and part of our promise to provide the best care and service to meet your aftercare needs.??? If you have questions, or wish to unsubscribe from this service please call [phone].  Thank you for Choosing our [place] [facility].

## 2019-07-02 NOTE — ED Provider Notes (Signed)
Patient is a 19 year old female presenting to the emergency department today complaining of acute allergic reaction.  The patient has a known milk allergy which causes anaphylaxis.  She ate a bar last night which contain milk when she started having some trouble breathing and broke out in hives.  The patient administered an EpiPen to herself last night and was brought to the emergency department by EMS.  EMS gave her another dose of epinephrine.  She was given Solu-Medrol prednisone and Benadryl in the emergency department.  The patient was doing better and did not have any repeat episodes throughout the night.  This morning after she took her daily medicines along with 20 mg of prednisone her godmother gave her a waffle and then she started having similar reaction and they looked at the ingredients and noticed that the waffle was made with milk.  The patient administered her EpiPen to herself once again and then and came directly to the emergency department.           Past Medical History:   Diagnosis Date   ??? ADHD     managed with meds   ??? Autism        Past Surgical History:   Procedure Laterality Date   ??? COLONOSCOPY N/A 01/14/2019    COLONOSCOPY/  BMI 21 PT IS AUTISTIC performed by Flint Melter, MD at [facility] ENDOSCOPY   ??? HX OTHER SURGICAL      EGD         No family history on file.    Social History     Socioeconomic History   ??? Marital status: SINGLE     Spouse name: Not on file   ??? Number of children: Not on file   ??? Years of education: Not on file   ??? Highest education level: Not on file   Occupational History   ??? Not on file   Social Needs   ??? Financial resource strain: Not on file   ??? Food insecurity     Worry: Not on file     Inability: Not on file   ??? Transportation needs     Medical: Not on file     Non-medical: Not on file   Tobacco Use   ??? Smoking status: Never Smoker   ??? Smokeless tobacco: Never Used   Substance and Sexual Activity   ??? Alcohol use: Not Currently   ??? Drug use: Not on file   ???  Sexual activity: Not on file   Lifestyle   ??? Physical activity     Days per week: Not on file     Minutes per session: Not on file   ??? Stress: Not on file   Relationships   ??? Social Product manager on phone: Not on file     Gets together: Not on file     Attends religious service: Not on file     Active member of club or organization: Not on file     Attends meetings of clubs or organizations: Not on file     Relationship status: Not on file   ??? Intimate partner violence     Fear of current or ex partner: Not on file     Emotionally abused: Not on file     Physically abused: Not on file     Forced sexual activity: Not on file   Other Topics Concern   ??? Not on file   Social History Narrative   ???  Not on file         ALLERGIES: Milk, Milk containing products, Cat dander, and Dog dander    Review of Systems   Constitutional: Negative.    Respiratory: Positive for shortness of breath.    Cardiovascular: Positive for palpitations.   Musculoskeletal: Negative.    Skin: Positive for color change.   Allergic/Immunologic: Positive for environmental allergies and food allergies.   Neurological: Negative.    Hematological: Negative.    All other systems reviewed and are negative.      Vitals:    07/02/19 1302   BP: (!) 142/70   Pulse: (!) 144   Resp: 24   Temp: 98.3 ??F (36.8 ??C)   SpO2: 97%   Weight: 58.1 kg (128 lb)   Height: 5\' 5"  (1.651 m)            Physical Exam     GENERAL:The patient has Body mass index is 21.3 kg/m??. Well-hydrated.    VITAL SIGNS: Heart rate, blood pressure, respiratory rate reviewed as recorded in  nurse's notes  EYES: Pupils reactive. Extraocular motion intact. No conjunctival redness or drainage.  NOSE: No nasal drainage or epistaxis.  MOUTH/THROAT: Pharynx clear; airway patent.  Floor the mouth is soft.  There is no edema in the oral cavity appreciated.  Uvula is midline.  NECK: Supple, no meningeal signs. Trachea midline. No masses or thyromegaly.  LUNGS: Breath sounds clear and equal  bilaterally no accessory muscle use  CARDIOVASCULAR: Sinus tachycardia no murmurs gallops or rubs appreciated.  ABDOMEN: Soft without tenderness. No palpable masses or organomegaly. No  peritoneal signs. No rigidity.  EXTREMITIES: No clubbing or cyanosis. No joint swelling. Normal muscle tone. No  restricted range of motion appreciated.  NEUROLOGIC: Sensation is grossly intact. Cranial nerve exam reveals face is  symmetrical, tongue is midline speech is clear.  SKIN: No petechiae. Good skin turgor palpated.  Patient has a mild erythematous diffuse redness to the upper chest but none on the arms or legs on arrival.  Erythema is blanchable and nonraised.  PSYCHIATRIC: Alert and oriented. Appropriate behavior and judgment.      MDM  Number of Diagnoses or Management Options  Diagnosis management comments: Anaphylaxis, allergic reaction, hives, palpitations, anxiety       Amount and/or Complexity of Data Reviewed  Tests in the medicine section of CPT??: ordered and reviewed  Obtain history from someone other than the patient: yes  Review and summarize past medical records: yes      ED Course as of Jul 02 1747   Sat Jul 02, 2019   1323 The patients brother who is a Runner, broadcasting/film/video told the nursing staff that he gave her an additional dose of epinephrine after they got home and 50 mg of Benadryl IM at home as well.    [id number] Patient still feeling nauseated but no episodes of vomiting.  Said the Zofran worked well for her last night so she will be given 4 mg of Zofran today to see if this helps.  I talked to them about the use of the epinephrine and the family is insistent that with any exposure no matter what the symptoms she requires epinephrine which is what has always been done.  We talked about a 4 to 6-hour observation.  After the epinephrine was given at 1 PM.  They are agreeable with this and understand the need for it.    [KH]   1746 An hour and a half after  patient's arrival to the emergency department she started  having sneezing.  This happened last night as well.  The patient's godmother says that they do not have a prescription for the steroids at home.  The needed prescription and they were just taking steroids out of the medicine cabinet.  According to the godmother the patient's mother is an OB/GYN here at [place].    [KH]      ED Course User Index  [KH] Keene Breath, DO       Procedures

## 2019-07-02 NOTE — ED Notes (Signed)
Patient brought to into ED via w/c from EMS bay.  Patient reports anaphylactic reaction to milk and came to ED.  States she was seen at [facility] yesterday for anaphylactic reaction due to milk exposure and had another anaphylactic reaction to eating a waffle this morning.  Reports taking epi pen about 1215 today.  Dr. Donnel Saxon at bedside.

## 2019-07-02 NOTE — ED Notes (Signed)
I have reviewed discharge instructions with the patient.  The patient verbalized understanding.    Patient left ED via Discharge Method: ambulatory to Home.    Opportunity for questions and clarification provided.       Patient given 0 scripts.         To continue your aftercare when you leave the hospital, you may receive an automated call from our care team to check in on how you are doing.  This is a free service and part of our promise to provide the best care and service to meet your aftercare needs." If you have questions, or wish to unsubscribe from this service please call [phone].  Thank you for Choosing our [place] [facility].

## 2019-07-02 NOTE — ED Notes (Signed)
 I have reviewed discharge instructions with the patient and caregiver.  The patient and caregiver verbalized understanding.    Patient left ED via Discharge Method: ambulatory to Home with godmother    Opportunity for questions and clarification provided.       Patient given 2 scripts. No esign        To continue your aftercare when you leave the hospital, you may receive an automated call from our care team to check in on how you are doing.  This is a free service and part of our promise to provide the best care and service to meet your aftercare needs." If you have questions, or wish to unsubscribe from this service please call [phone].  Thank you for Choosing our [facility] Emergency Department.

## 2019-07-02 NOTE — ED Triage Notes (Addendum)
Patient brought to into ED via w/c from EMS bay.  Patient reports anaphylactic reaction to milk and came to ED.  States she was seen at [facility] yesterday for anaphylactic reaction due to milk exposure and had another anaphylactic reaction to eating a waffle this morning.  Reports taking epi pen about 1215 today.  Dr. Donnel Saxon at bedside.

## 2019-07-02 NOTE — ED Provider Notes (Addendum)
Patient is a 19 year old female presenting to the emergency department today complaining of acute allergic reaction.  The patient has a known milk allergy which causes anaphylaxis.  She ate a bar last night which contain milk when she started having some trouble breathing and broke out in hives.  The patient administered an EpiPen to herself last night and was brought to the emergency department by EMS.  EMS gave her another dose of epinephrine.  She was given Solu-Medrol prednisone and Benadryl in the emergency department.  The patient was doing better and did not have any repeat episodes throughout the night.  This morning after she took her daily medicines along with 20 mg of prednisone her godmother gave her a waffle and then she started having similar reaction and they looked at the ingredients and noticed that the waffle was made with milk.  The patient administered her EpiPen to herself once again and then and came directly to the emergency department.           Past Medical History:   Diagnosis Date   ??? ADHD     managed with meds   ??? Autism        Past Surgical History:   Procedure Laterality Date   ??? COLONOSCOPY N/A 01/14/2019    COLONOSCOPY/  BMI 21 PT IS AUTISTIC performed by Flint Melter, MD at [facility] ENDOSCOPY   ??? HX OTHER SURGICAL      EGD         No family history on file.    Social History     Socioeconomic History   ??? Marital status: SINGLE     Spouse name: Not on file   ??? Number of children: Not on file   ??? Years of education: Not on file   ??? Highest education level: Not on file   Occupational History   ??? Not on file   Social Needs   ??? Financial resource strain: Not on file   ??? Food insecurity     Worry: Not on file     Inability: Not on file   ??? Transportation needs     Medical: Not on file     Non-medical: Not on file   Tobacco Use   ??? Smoking status: Never Smoker   ??? Smokeless tobacco: Never Used   Substance and Sexual Activity   ??? Alcohol use: Not Currently   ??? Drug use: Not on file    ??? Sexual activity: Not on file   Lifestyle   ??? Physical activity     Days per week: Not on file     Minutes per session: Not on file   ??? Stress: Not on file   Relationships   ??? Social Product manager on phone: Not on file     Gets together: Not on file     Attends religious service: Not on file     Active member of club or organization: Not on file     Attends meetings of clubs or organizations: Not on file     Relationship status: Not on file   ??? Intimate partner violence     Fear of current or ex partner: Not on file     Emotionally abused: Not on file     Physically abused: Not on file     Forced sexual activity: Not on file   Other Topics Concern   ??? Not on file   Social History Narrative   ???  Not on file         ALLERGIES: Milk, Milk containing products, Cat dander, and Dog dander    Review of Systems   Constitutional: Negative.    Respiratory: Positive for shortness of breath.    Cardiovascular: Positive for palpitations.   Musculoskeletal: Negative.    Skin: Positive for color change.   Allergic/Immunologic: Positive for environmental allergies and food allergies.   Neurological: Negative.    Hematological: Negative.    All other systems reviewed and are negative.      Vitals:    07/02/19 1302   BP: (!) 142/70   Pulse: (!) 144   Resp: 24   Temp: 98.3 ??F (36.8 ??C)   SpO2: 97%   Weight: 58.1 kg (128 lb)   Height: 5\' 5"  (1.651 m)            Physical Exam     GENERAL:The patient has Body mass index is 21.3 kg/m??. Well-hydrated.    VITAL SIGNS: Heart rate, blood pressure, respiratory rate reviewed as recorded in  nurse's notes  EYES: Pupils reactive. Extraocular motion intact. No conjunctival redness or drainage.  NOSE: No nasal drainage or epistaxis.  MOUTH/THROAT: Pharynx clear; airway patent.  Floor the mouth is soft.  There is no edema in the oral cavity appreciated.  Uvula is midline.  NECK: Supple, no meningeal signs. Trachea midline. No masses or thyromegaly.   LUNGS: Breath sounds clear and equal bilaterally no accessory muscle use  CARDIOVASCULAR: Sinus tachycardia no murmurs gallops or rubs appreciated.  ABDOMEN: Soft without tenderness. No palpable masses or organomegaly. No  peritoneal signs. No rigidity.  EXTREMITIES: No clubbing or cyanosis. No joint swelling. Normal muscle tone. No  restricted range of motion appreciated.  NEUROLOGIC: Sensation is grossly intact. Cranial nerve exam reveals face is  symmetrical, tongue is midline speech is clear.  SKIN: No petechiae. Good skin turgor palpated.  Patient has a mild erythematous diffuse redness to the upper chest but none on the arms or legs on arrival.  Erythema is blanchable and nonraised.  PSYCHIATRIC: Alert and oriented. Appropriate behavior and judgment.      MDM  Number of Diagnoses or Management Options  Diagnosis management comments: Anaphylaxis, allergic reaction, hives, palpitations, anxiety       Amount and/or Complexity of Data Reviewed  Tests in the medicine section of CPT??: ordered and reviewed  Obtain history from someone other than the patient: yes  Review and summarize past medical records: yes      ED Course as of Jul 02 1747   Sat Jul 02, 2019   1323 The patients brother who is a Runner, broadcasting/film/video told the nursing staff that he gave her an additional dose of epinephrine after they got home and 50 mg of Benadryl IM at home as well.    [id number] Patient still feeling nauseated but no episodes of vomiting.  Said the Zofran worked well for her last night so she will be given 4 mg of Zofran today to see if this helps.  I talked to them about the use of the epinephrine and the family is insistent that with any exposure no matter what the symptoms she requires epinephrine which is what has always been done.  We talked about a 4 to 6-hour observation.  After the epinephrine was given at 1 PM.  They are agreeable with this and understand the need for it.    [KH]    1746 An hour and a half  after patient's arrival to the emergency department she started having sneezing.  This happened last night as well.  The patient's godmother says that they do not have a prescription for the steroids at home.  The needed prescription and they were just taking steroids out of the medicine cabinet.  According to the godmother the patient's mother is an OB/GYN here at [place].    [KH]      ED Course User Index  [KH] Keene Breath, DO       Procedures

## 2019-07-02 NOTE — ED Notes (Signed)
I have reviewed discharge instructions with the patient and caregiver.  The patient and caregiver verbalized understanding.    Patient left ED via Discharge Method: ambulatory to Home with godmother    Opportunity for questions and clarification provided.       Patient given 2 scripts. No esign        To continue your aftercare when you leave the hospital, you may receive an automated call from our care team to check in on how you are doing.  This is a free service and part of our promise to provide the best care and service to meet your aftercare needs.??? If you have questions, or wish to unsubscribe from this service please call [phone].  Thank you for Choosing our [facility] Emergency Department.

## 2019-07-20 ENCOUNTER — Ambulatory Visit: Payer: PRIVATE HEALTH INSURANCE

## 2019-07-20 ENCOUNTER — Ambulatory Visit

## 2019-07-20 DIAGNOSIS — Z23 Encounter for immunization: Secondary | ICD-10-CM

## 2019-07-21 MED FILL — SERTRALINE HCL 50MG TABS: 50 mg | 90 days supply | Qty: 90 | Fill #1 | Status: AC

## 2019-07-21 MED FILL — GUANFACINE HCL ER 4MG TB24: 4 mg | 90 days supply | Qty: 90 | Fill #2 | Status: AC

## 2019-08-10 ENCOUNTER — Ambulatory Visit: Payer: PRIVATE HEALTH INSURANCE

## 2019-08-10 ENCOUNTER — Ambulatory Visit

## 2019-08-10 DIAGNOSIS — Z23 Encounter for immunization: Secondary | ICD-10-CM

## 2019-08-29 ENCOUNTER — Inpatient Hospital Stay: Admit: 2019-08-29

## 2019-09-22 MED FILL — GUANFACINE HCL ER 4MG TB24: 4 mg | 90 days supply | Qty: 90 | Fill #0 | Status: AC

## 2019-09-22 MED FILL — SERTRALINE HCL 50MG TABS: 50 mg | 90 days supply | Qty: 135 | Fill #0 | Status: AC

## 2019-09-29 MED FILL — GUANFACINE HCL ER 1MG TB24: 1 mg | 90 days supply | Qty: 90 | Fill #0 | Status: AC

## 2019-10-20 MED FILL — VYVANSE 60MG CHEW: 60 mg | 64 days supply | Qty: 64 | Fill #0 | Status: AC

## 2019-10-22 ENCOUNTER — Inpatient Hospital Stay
Admit: 2019-10-22 | Discharge: 2019-10-22 | Disposition: A | Discharge status: Home / Self Care | Payer: PRIVATE HEALTH INSURANCE | Attending: Student in an Organized Health Care Education/Training Program

## 2019-10-22 ENCOUNTER — Emergency Department: Admit: 2019-10-22 | Payer: PRIVATE HEALTH INSURANCE

## 2019-10-22 DIAGNOSIS — R1011 Right upper quadrant pain: Secondary | ICD-10-CM

## 2019-10-22 LAB — URINALYSIS W/ RFLX MICROSCOPIC
Blood, Urine: NEGATIVE
Blood: NEGATIVE
Glucose, Ur: NEGATIVE mg/dL
Glucose: NEGATIVE mg/dL
Ketone: >160 mg/dL
Ketones, Urine: >160 mg/dL
Leukocyte Esterase, Urine: NEGATIVE
Leukocyte Esterase: NEGATIVE
Nitrite, Urine: NEGATIVE
Nitrites: NEGATIVE
Specific Gravity, UA: 1.048 — ABNORMAL HIGH (ref 1.001–1.023)
Specific gravity: 1.048 — ABNORMAL HIGH (ref 1.001–1.023)
Urobilinogen, UA, POCT: 0.2 EU/dL (ref 0.2–1.0)
Urobilinogen: 0.2 EU/dL (ref 0.2–1.0)
pH (UA): 5.5 (ref 5.0–9.0)
pH, UA: 5.5 (ref 5.0–9.0)

## 2019-10-22 LAB — METABOLIC PANEL, COMPREHENSIVE
A-G Ratio: 1.2 (ref 1.2–3.5)
ALT (SGPT): 21 U/L (ref 12–65)
AST (SGOT): 16 U/L (ref 15–37)
Albumin: 3.6 g/dL (ref 3.5–5.0)
Alk. phosphatase: 82 U/L (ref 50–136)
Anion gap: 7 mmol/L (ref 7–16)
BUN: 11 MG/DL (ref 6–23)
Bilirubin, total: 0.3 MG/DL (ref 0.2–1.1)
CO2: 22 mmol/L (ref 21–32)
Calcium: 8.5 MG/DL (ref 8.3–10.4)
Chloride: 112 mmol/L — ABNORMAL HIGH (ref 98–107)
Creatinine: 0.49 MG/DL — ABNORMAL LOW (ref 0.6–1.0)
GFR est AA: >60 mL/min/{1.73_m2} (ref >60)
GFR est non-AA: >60 mL/min/{1.73_m2} (ref >60)
Globulin: 2.9 g/dL (ref 2.3–3.5)
Glucose: 87 mg/dL (ref 65–100)
Potassium: 3.7 mmol/L (ref 3.5–5.1)
Protein, total: 6.5 g/dL (ref 6.3–8.2)
Sodium: 141 mmol/L (ref 136–145)

## 2019-10-22 LAB — CBC WITH AUTOMATED DIFF
ABS. BASOPHILS: 0 10*3/uL (ref 0.0–0.2)
ABS. EOSINOPHILS: 0 10*3/uL (ref 0.0–0.8)
ABS. IMM. GRANS.: 0 10*3/uL (ref 0.0–0.5)
ABS. LYMPHOCYTES: 1.1 10*3/uL (ref 0.5–4.6)
ABS. MONOCYTES: 0.5 10*3/uL (ref 0.1–1.3)
ABS. NEUTROPHILS: 3.8 10*3/uL (ref 1.7–8.2)
ABSOLUTE NRBC: 0 10*3/uL (ref 0.0–0.2)
BASOPHILS: 1 % (ref 0.0–2.0)
EOSINOPHILS: 0 % — ABNORMAL LOW (ref 0.5–7.8)
HCT: 40.7 % (ref 35.8–46.3)
HGB: 14 g/dL (ref 11.7–15.4)
IMMATURE GRANULOCYTES: 0 % (ref 0.0–5.0)
LYMPHOCYTES: 20 % (ref 13–44)
MCH: 31.3 PG (ref 26.1–32.9)
MCHC: 34.4 g/dL (ref 31.4–35.0)
MCV: 91.1 FL (ref 79.6–97.8)
MONOCYTES: 9 % (ref 4.0–12.0)
MPV: 10.3 FL (ref 9.4–12.3)
NEUTROPHILS: 69 % (ref 43–78)
PLATELET: 299 10*3/uL (ref 150–450)
RBC: 4.47 M/uL (ref 4.05–5.2)
RDW: 12.1 % (ref 11.9–14.6)
WBC: 5.5 10*3/uL (ref 4.3–11.1)

## 2019-10-22 LAB — MAGNESIUM
Magnesium: 2 mg/dL (ref 1.8–2.4)
Magnesium: 2 mg/dL (ref 1.8–2.4)

## 2019-10-22 LAB — LIPASE
Lipase: 82 U/L (ref 73–393)
Lipase: 82 U/L (ref 73–393)

## 2019-10-22 LAB — HCG URINE, QL. - POC
HCG, Pregnancy, Urine, POC: NEGATIVE
Pregnancy test,urine (POC): NEGATIVE

## 2019-10-22 LAB — COMPREHENSIVE METABOLIC PANEL
ALT: 21 U/L (ref 12–65)
AST: 16 U/L (ref 15–37)
Albumin/Globulin Ratio: 1.2 (ref 1.2–3.5)
Albumin: 3.6 g/dL (ref 3.5–5.0)
Alkaline Phosphatase: 82 U/L (ref 50–136)
Anion Gap: 7 mmol/L (ref 7–16)
BUN: 11 MG/DL (ref 6–23)
CO2: 22 mmol/L (ref 21–32)
Calcium: 8.5 MG/DL (ref 8.3–10.4)
Chloride: 112 mmol/L — ABNORMAL HIGH (ref 98–107)
Creatinine: 0.49 MG/DL — ABNORMAL LOW (ref 0.6–1.0)
EGFR IF NonAfrican American: >60 mL/min/{1.73_m2} (ref >60)
GFR African American: >60 mL/min/{1.73_m2} (ref >60)
Globulin: 2.9 g/dL (ref 2.3–3.5)
Glucose: 87 mg/dL (ref 65–100)
Potassium: 3.7 mmol/L (ref 3.5–5.1)
Sodium: 141 mmol/L (ref 136–145)
Total Bilirubin: 0.3 MG/DL (ref 0.2–1.1)
Total Protein: 6.5 g/dL (ref 6.3–8.2)

## 2019-10-22 LAB — CBC WITH AUTO DIFFERENTIAL
Basophils %: 1 % (ref 0.0–2.0)
Basophils Absolute: 0 10*3/uL (ref 0.0–0.2)
Eosinophils %: 0 % — ABNORMAL LOW (ref 0.5–7.8)
Eosinophils Absolute: 0 10*3/uL (ref 0.0–0.8)
Granulocyte Absolute Count: 0 10*3/uL (ref 0.0–0.5)
Hematocrit: 40.7 % (ref 35.8–46.3)
Hemoglobin: 14 g/dL (ref 11.7–15.4)
Immature Granulocytes: 0 % (ref 0.0–5.0)
Lymphocytes %: 20 % (ref 13–44)
Lymphocytes Absolute: 1.1 10*3/uL (ref 0.5–4.6)
MCH: 31.3 PG (ref 26.1–32.9)
MCHC: 34.4 g/dL (ref 31.4–35.0)
MCV: 91.1 FL (ref 79.6–97.8)
MPV: 10.3 FL (ref 9.4–12.3)
Monocytes %: 9 % (ref 4.0–12.0)
Monocytes Absolute: 0.5 10*3/uL (ref 0.1–1.3)
NRBC Absolute: 0 10*3/uL (ref 0.0–0.2)
Neutrophils %: 69 % (ref 43–78)
Neutrophils Absolute: 3.8 10*3/uL (ref 1.7–8.2)
Platelets: 299 10*3/uL (ref 150–450)
RBC: 4.47 M/uL (ref 4.05–5.2)
RDW: 12.1 % (ref 11.9–14.6)
WBC: 5.5 10*3/uL (ref 4.3–11.1)

## 2019-10-22 MED ORDER — SUCRALFATE 1 GRAM TAB
1 gram | ORAL_TABLET | Freq: Four times a day (QID) | ORAL | 2 refills | Status: AC
Start: 2019-10-22 — End: 2019-11-06

## 2019-10-22 MED ORDER — SODIUM CHLORIDE 0.9 % IJ SYRG
Freq: Three times a day (TID) | INTRAMUSCULAR | Status: DC
Start: 2019-10-22 — End: 2019-10-22

## 2019-10-22 MED ORDER — PANTOPRAZOLE 40 MG IV SOLR
40 mg | Freq: Once | INTRAVENOUS | Status: AC
Start: 2019-10-22 — End: 2019-10-22
  Administered 2019-10-22: 20:00:00 via INTRAVENOUS

## 2019-10-22 MED ORDER — PROMETHAZINE 25 MG RECTAL SUPPOSITORY
25 mg | Freq: Four times a day (QID) | RECTAL | 0 refills | Status: AC | PRN
Start: 2019-10-22 — End: 2019-10-29

## 2019-10-22 MED ORDER — SODIUM CHLORIDE 0.9 % IJ SYRG
INTRAMUSCULAR | Status: DC | PRN
Start: 2019-10-22 — End: 2019-10-22

## 2019-10-22 MED ORDER — SODIUM CHLORIDE 0.9% BOLUS IV
0.9 % | Freq: Once | INTRAVENOUS | Status: AC
Start: 2019-10-22 — End: 2019-10-22
  Administered 2019-10-22: 20:00:00 via INTRAVENOUS

## 2019-10-22 MED ORDER — ONDANSETRON (PF) 4 MG/2 ML INJECTION
4 mg/2 mL | INTRAMUSCULAR | Status: AC
Start: 2019-10-22 — End: 2019-10-22
  Administered 2019-10-22: 20:00:00 via INTRAVENOUS

## 2019-10-22 MED ORDER — ONDANSETRON 4 MG TAB, RAPID DISSOLVE
4 mg | ORAL_TABLET | Freq: Three times a day (TID) | ORAL | 2 refills | Status: AC | PRN
Start: 2019-10-22 — End: ?

## 2019-10-22 MED FILL — ONDANSETRON (PF) 4 MG/2 ML INJECTION: 4 mg/2 mL | INTRAMUSCULAR | Qty: 2

## 2019-10-22 MED FILL — PANTOPRAZOLE 40 MG IV SOLR: 40 mg | INTRAVENOUS | Qty: 40

## 2019-10-22 NOTE — Telephone Encounter (Signed)
 Reason for Disposition  . [1] SEVERE vomiting (e.g., 6 or more times/day) AND [2] present > 8 hours    Answer Assessment - Initial Assessment Questions  1. VOMITING SEVERITY: How many times have you vomited in the past 24 hours?      - MILD:  1 - 2 times/day     - MODERATE: 3 - 5 times/day, decreased oral intake without significant weight loss or symptoms of dehydration     - SEVERE: 6 or more times/day, vomits everything or nearly everything, with significant weight loss, symptoms of dehydration       More than 6 +, unable to keep anything down, symptoms of dehydration    2. ONSET: When did the vomiting begin?       5 days ago    3. FLUIDS: What fluids or food have you vomited up today? Have you been able to keep any fluids down?      Patient reports she has vomited ginger ale, sprite, and water . Patient denies keeping down any fluids today.    4. ABDOMINAL PAIN: Are your having any abdominal pain? If yes : How bad is it and what does it feel like? (e.g., crampy, dull, intermittent, constant)       Generalized but reports pain is from the vomiting    5. DIARRHEA: Is there any diarrhea? If so, ask: How many times today?       Bouts of diarrhea but nothing consistent     6. CONTACTS: Is there anyone else in the family with the same symptoms?       Denies anyone else sick in the house    7. CAUSE: What do you think is causing your vomiting?      Unsure    8. HYDRATION STATUS: Any signs of dehydration? (e.g., dry mouth [not only dry lips], too weak to stand) When did you last urinate?      Reports urinated this morning, dizziness, weakness    9. OTHER SYMPTOMS: Do you have any other symptoms? (e.g., fever, headache, vertigo, vomiting blood or coffee grounds, recent head injury)      Denies fever but reports fatigue, weakness, dizziness    10. PREGNANCY: Is there any chance you are pregnant? When was your last menstrual period?        n/a    Protocols used: [facility]    Brief  description of triage: Patient's father reports patient was seen by PCP yesterday who instructed patient to go to ER if vomiting continues due to dehydration    Triage indicates for patient to: See disposition    Care advice provided, patient verbalizes understanding; denies any other questions or concerns; instructed to call back for any new or worsening symptoms.    Attention Provider:  Thank you for allowing me to participate in the care of your patient.  The patient was connected to triage in response to symptoms provided.   Please do not respond through this encounter as the response is not directed to a shared pool.

## 2019-10-22 NOTE — Progress Notes (Signed)
Received handoff from Dr. Eppie Gibson to follow-up on labs and urinalysis. RUQ [place] with no acute abnormalities. Labs unremarkable.  UA with no significant UTI.  Patient to be discharged home with Zofran and follow-up with PCP.  Patient tolerating po.

## 2019-10-22 NOTE — ED Notes (Signed)
 I have reviewed discharge instructions with the patient and parentt.  The patient and parent verbalized understanding.    Patient left ED via Discharge Method: ambulatory to Home with her father.    Opportunity for questions and clarification provided.       Patient given 3 scripts.         To continue your aftercare when you leave the hospital, you may receive an automated call from our care team to check in on how you are doing. This is a free service and part of our promise to provide the best care and service to meet your aftercare needs. If you have questions, or wish to unsubscribe from this service please call [phone]. Thank you for Choosing our [facility] Emergency Department.

## 2019-10-22 NOTE — ED Notes (Signed)
Pt ambulatory to triage with father. Pt father states 5 day hx of n/v/d but diarrhea is slowing down. Denies fever. Pt reports tenderness to palpation on RUQ and RLQ. Pt states saw PCP but no labs or imaging. Pt still has appendix and gallbladder.     Iv unsuccessful. Labs collected.

## 2019-10-22 NOTE — ED Provider Notes (Signed)
ED Provider Notes by Debera Lat, DO at 10/22/19 1525                Author: Debera Lat, DO  Service: Emergency Medicine  Author Type: Physician       Filed: 10/22/19 1625  Date of Service: 10/22/19 1525  Status: Addendum          Editor: Debera Lat, DO (Physician)          Related Notes: Original Note by Debera Lat, DO (Physician) filed at 10/22/19 1528               19 year old female patient presents to the emergency department with reports of ongoing nausea, vomiting and abdominal  discomfort.  Patient states symptoms started 5 days ago.  She was seen by her primary provider on Friday and encouraged to increase fluid intake and attempt Pepcid for pain relief.  Patient been unable to tolerate medications, food or fluid per report.   They have attempted Compazine Phenergan and Phenergan suppositories without effect.  Patient does report one episode of diarrhea that is now resolved.  Denies ongoing black bloody or tarry stool.  She reports no fever, chills, chest pain pressure or  tightness.  Pain is localized to the lower epigastrium and right upper quadrant.  This does not radiate.  Patient reports emesis appears yellow in color.  She denies previous surgeries on abdomen.  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  She is not currently taking medications for this issue.                  Past Medical History:        Diagnosis  Date         ?  ADHD            managed with meds         ?  Autism               Past Surgical History:         Procedure  Laterality  Date          ?  COLONOSCOPY  N/A  01/14/2019          COLONOSCOPY/  BMI 21 PT IS AUTISTIC performed by Flint Melter, MD at [facility] ENDOSCOPY          ?  HX OTHER SURGICAL              EGD             No family history on file.        Social History          Socioeconomic History         ?  Marital status:  SINGLE              Spouse name:   Not on file         ?  Number of children:  Not on file     ?  Years of education:  Not on file     ?  Highest education level:  Not on file       Occupational History        ?  Not on file       Tobacco Use         ?  Smoking status:  Never Smoker     ?  Smokeless tobacco:  Never Used       Substance and Sexual Activity         ?  Alcohol use:  Not Currently     ?  Drug use:  Not on file     ?  Sexual activity:  Not on file        Other Topics  Concern        ?  Not on file       Social History Narrative        ?  Not on file          Social Determinants of Health          Financial Resource Strain:         ?  Difficulty of Paying Living Expenses:        Food Insecurity:         ?  Worried About Charity fundraiser in the Last Year:      ?  Arboriculturist in the Last Year:        Transportation Needs:         ?  Film/video editor (Medical):      ?  Lack of Transportation (Non-Medical):        Physical Activity:         ?  Days of Exercise per Week:      ?  Minutes of Exercise per Session:        Stress:         ?  Feeling of Stress :        Social Connections:         ?  Frequency of Communication with Friends and Family:      ?  Frequency of Social Gatherings with Friends and Family:      ?  Attends Religious Services:      ?  Active Member of Clubs or Organizations:      ?  Attends Archivist Meetings:      ?  Marital Status:        Intimate Partner Violence:         ?  Fear of Current or Ex-Partner:      ?  Emotionally Abused:      ?  Physically Abused:         ?  Sexually Abused:               ALLERGIES: Milk, Milk containing products, Cat dander, and Dog dander      Review of Systems    Constitutional: Negative for chills, diaphoresis and fever.    HENT: Negative for congestion, sneezing and sore throat.     Eyes: Negative for visual disturbance.    Respiratory: Negative for cough, chest tightness, shortness of breath and wheezing.     Cardiovascular: Negative for chest pain and leg swelling.     Gastrointestinal: Positive for abdominal pain, diarrhea , nausea and vomiting.  Negative for blood in stool.    Endocrine: Negative for polyuria.    Genitourinary: Negative for difficulty urinating, dysuria, flank pain, hematuria and urgency.    Musculoskeletal: Negative for back pain, myalgias, neck pain and neck stiffness.    Skin: Negative for color change and rash.    Neurological: Negative for dizziness, syncope, speech difficulty, weakness, light-headedness, numbness and headaches.    Psychiatric/Behavioral: Negative for behavioral problems.    All other systems reviewed and are negative.  Vitals:          10/22/19 1508        BP:  121/78     Pulse:  87     Resp:  16     Temp:  98.1 ??F (36.7 ??C)     SpO2:  98%     Weight:  56.7 kg (125 lb)        Height:  5\' 5"  (1.651 m)                Physical Exam   Vitals and nursing note reviewed.   Constitutional:        General: She is not in acute distress.     Appearance: She is well-developed. She is not diaphoretic.      Comments: Alert and oriented to person place and time.  No acute distress,  speaks in clear, fluid sentences.    HENT:       Head: Normocephalic and atraumatic.      Right Ear: External ear normal.      Left Ear: External ear normal.      Nose: Nose normal.   Eyes:       Pupils: Pupils are equal, round, and reactive to light.   Cardiovascular:       Rate and Rhythm: Normal rate and regular rhythm.      Heart sounds: Normal heart sounds. No murmur heard.   No friction rub. No gallop.    Pulmonary:       Effort: Pulmonary effort is normal. No respiratory distress.      Breath sounds: Normal breath sounds. No stridor. No decreased breath  sounds, wheezing, rhonchi or rales.   Chest :       Chest wall: No tenderness.   Abdominal :      General: There is no distension.      Palpations: Abdomen is soft. There is no mass.      Tenderness: There is abdominal tenderness  in the right upper quadrant. There is no guarding or rebound. Positive signs  include  Murphy's sign.      Hernia: No hernia is present.            Comments: Patient exhibits some voluntary guarding with palpation of the right upper quadrant, Murphy sign is felt to be positive,  there is no pain at McBurney's point.  No distention or rebound.     Musculoskeletal:          General: No tenderness or deformity. Normal range of motion.      Cervical back: Normal range of motion.    Skin:      General: Skin is warm and dry.   Neurological :       Mental Status: She is alert and oriented to person, place, and time.      Cranial Nerves: No cranial nerve deficit.             MDM   Number of Diagnoses or Management Options   Nausea and vomiting, intractability of vomiting not specified, unspecified vomiting type: new and requires workup   Diagnosis management comments: 19 year old female patient presenting with nausea vomiting and right upper quadrant abdominal pain.  Will obtain basic labs, treat nausea and provide 1 dose of Protonix  and IV fluids.  Will obtain ultrasound imaging to start.      Reviewed patient's chart including visit from earlier this week.  She was encouraged to seek ER evaluation at that time  but declined.          Amount and/or Complexity of Data Reviewed   Clinical lab tests: ordered and reviewed   Tests in the radiology section of CPT??: ordered and reviewed   Tests in the medicine section of CPT??: ordered and reviewed    Independent visualization of images, tracings, or specimens: yes      Risk of Complications, Morbidity, and/or Mortality   Presenting problems: moderate  Diagnostic procedures: low  Management options: moderate     Patient Progress   Patient progress: stable             Procedures

## 2019-10-24 NOTE — Progress Notes (Signed)
 Patient identified as eligible for Employee Care Management.  Care Manager contacted the patient, verified DOB and zip code as identifiers. Provided introduction and explanation of the Nurse Care Manager role.  Agreed to CM follow-up and assistance.  CM initial assessment completed.      19 yo patient presented to ER at [facility] for RUQ abdominal pain and uncontrolled vomiting and was DC to home on 10/22/19.    Patient with history of abdominal pain with uncontrolled nausea and vomiting since 10/18/19. Denies fever, diarrhea or other symptoms. Patient was seen by PCP (Prisma) on 10/21/19. Treated with Zofran  and Phenergan  suppository. Labs ordered. Due to continued symptoms and unable to keep any PO intake down, patient was seen in ER at [facility] on 10/22/19. Workup including labs and Abdominal US  were normal. Patient was treated with IV fluids, and IV Zofran  and Protonix . She was discharged home to follow up with PCP.     ACM spoke with patient who reports no real relief in symptoms, continues to have nausea and vomiting at intervals. Scheduled for follow up with PCP today. Patient's mother is a physician. Aware of Red Flag Symptoms and when to return to ER. No new or worsening symptoms or complaints. Will plan follow up in 3 to 4 days.    Last ER Visit: 07/02/19 for allergic reaction    Identified chronic condition(s) with impact to ER visit:  Severe milk allergy     Past Medical History:   Diagnosis Date   . ADHD     managed with meds   . Autism        Focused assessment:  Gastrointestinal Condition Focused Assessment    Skin- any open wounds, incisions or appliance No  New or worsening pain? yes  If yes, pain rated 0-10: 5 Location/pain characteristics: abdomen, mainly RUQ   New or worsening numbness or tingling? no  Activity level- moving several times a day, or as recommended? yes  Abnormal activity level reported: no   Nutrition- prescribed diet? yes   Diet prescribed or recommended: GI soft,liquids  Difficulty swallowing  no  Last weight of 125 lbs on 10/21/19  Last BM on 10/22/19 abnormal consistency or amount no  Abnormal labs no     In the last 24 hour have you experienced;   Fever no  Low body temperature no  Chills or shaking no  Sweating no  Fast heart rate no  Fast breathing no  Dizziness/lightheadedness no  Confusion or unusual change in mental status no  Diarrhea no  Nausea yes   Vomiting yes  Shortness of breath or difficulty breathing no  Less urine output no  Cold, clammy, and pale skin no  Skin rash or skin color changes no    Medications:   New Medications at Discharge: Zofran , Carafate,Phenergan  Suppository    Changed Medications at Discharge: none    Discontinued Medications at Discharge: none    Red Flag Symptoms:Call your doctor or return to ER if :   You have symptoms of dehydration, such as:   Dry eyes and a dry mouth.   Passing only a little urine.   Feeling thirstier than usual.   You have new or worsening belly pain.   You have a new or higher fever.   You vomit blood or what looks like coffee grounds.    Barriers Identified / Support system:  patient, mother and father      Discharge Instructions:  Reviewed discharge instructions with patient.  Patient verbalizes  understanding of discharge instructions and importance of follow-up care.     Barriers/Challenges to Care: []   Decline in memory    []   Language barrier     []   Emotional       []   Limited mobility  []   Lack of motivation       []  Vision, hearing or cognitive impairment    []   Knowledge    []  Financial    []   Lack of support  []   Pain []   Other [x]   None    CM Intitial Plan of Care   Goal: Attends follow-up appointments as directed.   Scheduled for PCP follow up 10/24/19    Goal:Understands red flags post discharge.   Reviewed Red Flag Symptoms    Review and discussion of initial plan of care with patient, who has provided input to plan, verbalized understanding and agrees with current goals.      Upcoming appointments: PCP (Prisma)  10/24/19    Patient provided opportunity to ask further questions.  No other clinical, social, or functional needs identified. Patient verbalized understanding of all information discussed.  Patient received my contact information for any further questions, concerns, or needs.    Plan next call:  3 to 4 days

## 2019-10-25 ENCOUNTER — Encounter

## 2019-10-31 ENCOUNTER — Encounter

## 2019-11-02 ENCOUNTER — Inpatient Hospital Stay: Admit: 2019-11-02 | Payer: PRIVATE HEALTH INSURANCE | Attending: Physician Assistant

## 2019-11-02 DIAGNOSIS — R1031 Right lower quadrant pain: Secondary | ICD-10-CM

## 2019-11-02 MED ORDER — SINCALIDE 5 MCG IJ SOLR
5 mcg | Freq: Once | INTRAMUSCULAR | Status: AC
Start: 2019-11-02 — End: 2019-11-02
  Administered 2019-11-02: 17:00:00 via INTRAVENOUS

## 2019-11-02 MED ORDER — NUCLEAR MEDICINE ISOTOPE
Freq: Once | Status: AC
Start: 2019-11-02 — End: 2019-11-02
  Administered 2019-11-02: 16:00:00

## 2019-11-07 NOTE — Progress Notes (Signed)
Care Manager contacted the patient by telephone in follow up.  Verified DOB and zip code with patient as identifiers.      Patient reports she is feeling better. Was seen by GI and had HIDA scan with 36% EF. Reports she is scheduled to see a surgeon for consideration of gallbladder surgery. No new complaints or concerns. Will plan follow up in about 1 to 2 weeks.     Assessment of clinical changes and knowledge demonstrated since last call:   Ongoing Plan of Care:     Goal: Attends follow-up appointments as directed.   11/07/19 patient completed follow up with PCP,GI and had HIDA scan   11/07/19 Scheduled for appointment with surgeon 11/08/19   Scheduled for PCP follow up 10/24/19    Goal:Understands red flags post discharge.   Reviewed Red Flag Symptoms    Review and discussion of plan of care with patient, who has provided input to plan, verbalized understanding and agrees with current goals.      Any recurrence Red Flags or continued symptoms:none     Medication Regimen Change:none  Completed a review of medications with patient, who verbalized understanding of how and when to take medications.      Barriers / Adherence with medications:none    Upcoming Appointments:    Future Appointments   Date Time Provider Department Center   11/08/2019 11:30 AM [place], Romona Curls, MD [facility]       Patient asked questions appropriately and denied any additional needs at this time.  Patient verbalized understanding of all information discussed.  Patient has my name and contact information for any follow up needs or questions.     Plan next call:1 to 2 weeks

## 2019-11-08 ENCOUNTER — Ambulatory Visit: Admit: 2019-11-08 | Discharge: 2019-11-08 | Payer: PRIVATE HEALTH INSURANCE | Attending: Surgery

## 2019-11-08 ENCOUNTER — Ambulatory Visit: Attending: Surgery

## 2019-11-08 DIAGNOSIS — R1013 Epigastric pain: Secondary | ICD-10-CM

## 2019-11-08 NOTE — Progress Notes (Signed)
H&P/Consult Note/Progress Note/Office Note:   Robin Mendoza  MRN: [id number]  DOB:Nov 27, 2000  Age:19 y.o.    HPI: Robin Mendoza is a 19 y.o. female who is here for biliary dyskenesia. The patient has a PMHx of ADHD, eosinophilic esophagitis, and autism. She presented with complaints of abdominal pain which started early June 2021. The pain is located in the epigastrium and in the RUQ without radiation. Patient describes the pain as intermittent and related to some foods. Additional symptoms include nausea and vomiting. She has tried phenergan and compazine. Patient denies diarrhea, constipation, fever, chills and jaundice. An U/S was within normal limits. HIDA showed EF of 36% and no pain with CCK. Recent LFTs are within normal limits.       Past Medical History:   Diagnosis Date   ??? ADHD     managed with meds   ??? Autism      Past Surgical History:   Procedure Laterality Date   ??? COLONOSCOPY N/A 01/14/2019    COLONOSCOPY/  BMI 21 PT IS AUTISTIC performed by Flint Melter, MD at [facility] ENDOSCOPY   ??? HX OTHER SURGICAL      EGD     Current Outpatient Medications   Medication Sig   ??? ondansetron (ZOFRAN ODT) 4 mg disintegrating tablet Take 1 Tablet by mouth every eight (8) hours as needed for Nausea.   ??? sertraline HCl (SERTRALINE PO) Take  by mouth.   ??? guanfacine HCl (INTUNIV ER PO) Take  by mouth.   ??? lisdexamfetamine dimesylate (VYVANSE PO) Take  by mouth.     No current facility-administered medications for this visit.     Milk, Milk containing products, Cat dander, and Dog dander  Social History     Socioeconomic History   ??? Marital status: SINGLE     Spouse name: Not on file   ??? Number of children: Not on file   ??? Years of education: Not on file   ??? Highest education level: Not on file   Tobacco Use   ??? Smoking status: Never Smoker   ??? Smokeless tobacco: Never Used   Substance and Sexual Activity   ??? Alcohol use: Not Currently     Social Determinants of Health     Financial Resource Strain:     ??? Difficulty of Paying Living Expenses:    Food Insecurity:    ??? Worried About Charity fundraiser in the Last Year:    ??? Arboriculturist in the Last Year:    Transportation Needs:    ??? Film/video editor (Medical):    ??? Lack of Transportation (Non-Medical):    Physical Activity:    ??? Days of Exercise per Week:    ??? Minutes of Exercise per Session:    Stress:    ??? Feeling of Stress :    Social Connections:    ??? Frequency of Communication with Friends and Family:    ??? Frequency of Social Gatherings with Friends and Family:    ??? Attends Religious Services:    ??? Marine scientist or Organizations:    ??? Attends Music therapist:    ??? Marital Status:      Social History     Tobacco Use   Smoking Status Never Smoker   Smokeless Tobacco Never Used     No family history on file.  ROS: The patient has no difficulty with chest pain or shortness of breath.  No fever or  chills.  Comprehensive review of systems was otherwise unremarkable except as noted above.    Physical Exam:   Visit Vitals  Ht '5\' 5"'  (1.651 m)   Wt 125 lb (56.7 kg)   BMI 20.80 kg/m??     Constitutional: Alert, oriented, cooperative patient in no acute distress; appears stated age    Eyes:Sclera are clear. EOMs intact  ENMT: no external lesions gross hearing normal; no obvious neck masses, no ear or lip lesions, nares normal  CV: RRR. Normal perfusion  Resp: No JVD.  Breathing is  non-labored; no audible wheezing.    GI: soft and non-distended, ttp, Negative Murphy's  Musculoskeletal: unremarkable with normal function. No embolic signs or cyanosis.   Neuro:  Oriented; moves all 4; no focal deficits  Psychiatric: normal affect and mood, no memory impairment    Recent vitals (if inpt):  '@IPVITALS' (24:)@    Labs:  No results for input(s): WBC, HGB, PLT, NA, K, CL, CO2, BUN, CREA, GLU, PTP, INR, APTT, TBIL, TBILI, CBIL, ALT, AP, AML, LPSE, LCAD, NH4, TROPT, TROIQ, PCO2, PO2, HCO3, HGBEXT, PLTEXT, INREXT in the last 72 hours.    No lab exists  for component: SGOT, GPT,  PH    Lab Results   Component Value Date/Time    WBC 5.5 10/22/2019 03:13 PM    HGB 14.0 10/22/2019 03:13 PM    PLATELET 299 10/22/2019 03:13 PM    Sodium 141 10/22/2019 04:43 PM    Potassium 3.7 10/22/2019 04:43 PM    Chloride 112 (H) 10/22/2019 04:43 PM    CO2 22 10/22/2019 04:43 PM    BUN 11 10/22/2019 04:43 PM    Creatinine 0.49 (L) 10/22/2019 04:43 PM    Glucose 87 10/22/2019 04:43 PM    Bilirubin, total 0.3 10/22/2019 04:43 PM    ALT (SGPT) 21 10/22/2019 04:43 PM    Alk. phosphatase 82 10/22/2019 04:43 PM    Lipase 82 10/22/2019 04:43 PM       I reviewed recent labs and recent radiologic studies.  [place] Results (most recent):  Results from [place] encounter on 10/22/19    [place] ABD LTD    Narrative  HISTORY:Right upper quadrant pain, nausea and vomiting    EXAM: Right upper quadrant ultrasound    TECHNIQUE: Real-time grayscale and color Doppler imaging is performed in the  longitudinal and transverse planes.    No comparison.    FINDINGS:There is normal hepatic echogenicity..  There is no biliary ductal  dilatation.  The common bile duct measures 4 mm.  The right kidney measures 10  cm.    The gallbladder is unremarkable.  No evidence of gallstones.  No gallbladder  wall thickening or pericholecystic fluid.    The aorta is normal.  The IVC is patent.    Impression  Unremarkable right upper quadrant ultrasound.  Nuclear Medicine Results (most recent):  Results from [place] encounter on 11/02/19    NM HEPATOBILIARY W INTERVENTION    Narrative  NUCLEAR MEDICINE HEPATOBILIARY SCAN, 11/02/2019.    CLINICAL HISTORY: Right lower quadrant pain, nausea, and vomiting.    Technique: Sequential planar images of the abdomen were obtained following the  uneventful intravenous administration of 6.2 mCi Tc 25mCholetec. In addition,  1.1 mcg of cholecystokinin was administered at 40 minutes into the study.    FINDINGS:    Following the administration of radiotracer, there is prompt  uptake by the liver  and clearance of cardiac blood pool activity. Early filling of the extra  hepatic  bile ducts is seen at 5 minutes. Early filling of the gallbladder is seen at 10  minutes.  Activity seen within small bowel loops at 50 minutes.    Cholecystokinin was administered at 40 minutes into the study. The gallbladder  ejection fraction was measured to be 36% which is low normal. In addition, the  patient denied any abdominal pain with the administration of CCK.    Impression  1. Normal filling of the gallbladder without findings to suggest acute  cholecystitis.    2. Low normal gallbladder ejection fraction, and no abdominal symptoms with the  administration of CCK.  Therefore, no strong evidence for cholecystitis/biliary  dyskinesia is present.        I independently reviewed radiology images for studies I described above or studies I have ordered.   Admission date (for inpatients): (Not on file)   * No surgery found *  * No surgery found *    ASSESSMENT/PLAN:  No signs or symptoms at this time of active gallbladder disease.   Pt will follow up if any changes occur

## 2019-11-24 NOTE — Progress Notes (Signed)
Resolving current episode of case management.  Patient has met patient stated and/or medical goals.      Patient consistenly demonstrates understanding of the medical action plan through execution of plan. Appointments with key providers are scheduled and attended. Plan of care is modified and updated to address new challenges and barriers with minimal support from the CM team(proactively uses physicians and community resources) The support system remains current and has been modified as needed.Patient continues to acquire needed resources from the current support system established. Teach back demonstrates that patient understands education for self management of chronic conditions. Patient consistenly communicates understanding of signs,symptoms and complications for all major diagnoses.   Patient modifies his/her lifestyle toreduce or avoid risk factors to his/her health.     Patient reports 100% improvement in symptoms, no further needs or concerns and request discharge from CM services.ECM will follow as needed and patient has ECM contact information for future needs.

## 2019-11-30 MED FILL — SERTRALINE HCL 50MG TABS: 50 mg | 90 days supply | Qty: 135 | Fill #1 | Status: AC

## 2019-11-30 MED FILL — GUANFACINE HCL ER 4MG TB24: 4 mg | 90 days supply | Qty: 90 | Fill #1 | Status: AC

## 2019-11-30 MED FILL — GUANFACINE HCL ER 1MG TB24: 1 mg | 90 days supply | Qty: 90 | Fill #1 | Status: AC

## 2019-12-13 MED FILL — VYVANSE 60MG CHEW: 60 mg | 90 days supply | Qty: 90 | Fill #0 | Status: AC

## 2020-02-22 MED FILL — XOLAIR 150 MG SOLR (SRX): 150 mg | SUBCUTANEOUS | 28 days supply | Qty: 4 | Fill #0 | Status: AC

## 2020-03-06 MED FILL — GUANFACINE HCL ER 4MG TB24: 4 mg | 90 days supply | Qty: 90 | Fill #2 | Status: AC

## 2020-03-06 MED FILL — SERTRALINE HCL 50MG TABS: 50 mg | 90 days supply | Qty: 135 | Fill #2 | Status: AC

## 2020-03-26 MED FILL — XOLAIR 150 MG SOLR (SRX): 150 mg | SUBCUTANEOUS | 28 days supply | Qty: 4 | Fill #1 | Status: AC

## 2020-04-09 ENCOUNTER — Ambulatory Visit: Payer: PRIVATE HEALTH INSURANCE

## 2020-04-09 ENCOUNTER — Ambulatory Visit

## 2020-04-09 DIAGNOSIS — Z23 Encounter for immunization: Secondary | ICD-10-CM

## 2020-04-17 MED FILL — XOLAIR 150 MG SOLR (SRX): 150 mg | SUBCUTANEOUS | 28 days supply | Qty: 4 | Fill #2 | Status: AC

## 2020-05-16 MED FILL — XOLAIR 150 MG SOLR (SRX): 150 mg | SUBCUTANEOUS | 28 days supply | Qty: 4 | Fill #3 | Status: AC

## 2020-05-23 MED FILL — GUANFACINE HCL ER 4MG TB24: 4 mg | 90 days supply | Qty: 90 | Fill #3 | Status: AC

## 2020-05-23 MED FILL — SERTRALINE HCL 50MG TABS: 50 mg | 90 days supply | Qty: 135 | Fill #3 | Status: AC

## 2020-06-12 MED FILL — XOLAIR 150 MG SOLR (SRX): 150 mg | SUBCUTANEOUS | 28 days supply | Qty: 4 | Fill #4 | Status: AC

## 2020-07-10 MED FILL — XOLAIR 150 MG SOLR (SRX): 150 mg | SUBCUTANEOUS | 28 days supply | Qty: 4 | Fill #5 | Status: AC

## 2020-07-17 MED FILL — GUANFACINE HCL ER 4MG TB24: 4 mg | 90 days supply | Qty: 90 | Fill #0 | Status: AC

## 2020-07-17 MED FILL — SERTRALINE HCL 50MG TABS: 50 mg | 90 days supply | Qty: 135 | Fill #0 | Status: AC

## 2020-07-17 MED FILL — LANSOPRAZOLE 30MG CPDR: 30 mg | 90 days supply | Qty: 180 | Fill #0 | Status: AC

## 2020-07-17 MED FILL — GUANFACINE HCL ER 1MG TB24: 1 mg | 90 days supply | Qty: 90 | Fill #0 | Status: AC

## 2020-07-24 ENCOUNTER — Inpatient Hospital Stay: Admit: 2020-07-24

## 2020-08-07 MED FILL — XOLAIR 150 MG SOLR (SRX): 150 mg | SUBCUTANEOUS | 28 days supply | Qty: 4 | Fill #6 | Status: AC

## 2020-08-08 ENCOUNTER — Encounter

## 2020-08-17 ENCOUNTER — Inpatient Hospital Stay: Admit: 2020-08-17 | Payer: BLUE CROSS/BLUE SHIELD | Attending: Gastroenterology | Primary: Obstetrics & Gynecology

## 2020-08-17 DIAGNOSIS — R0789 Other chest pain: Secondary | ICD-10-CM

## 2020-08-17 MED ORDER — BARIUM SULFATE 60 % (W/V) ORAL SUSP
60 % (w/v) | Freq: Once | ORAL | Status: AC
Start: 2020-08-17 — End: 2020-08-17
  Administered 2020-08-17: 18:00:00 via ORAL

## 2020-08-17 MED ORDER — SOD BICARB-CITRIC AC-SIMETH 2.21 GRAM-1.53 GRAM/4 GRAM ORAL GRAN IN PK
Freq: Once | ORAL | Status: AC
Start: 2020-08-17 — End: 2020-08-17
  Administered 2020-08-17: 18:00:00 via ORAL

## 2020-08-17 MED ORDER — BARIUM SULFATE 98 % ORAL SUSP, RECON
98 % | Freq: Once | ORAL | Status: AC
Start: 2020-08-17 — End: 2020-08-17
  Administered 2020-08-17: 18:00:00 via ORAL

## 2020-08-17 MED ORDER — BARIUM SULFATE 700 MG TAB
700 mg | Freq: Once | ORAL | Status: AC
Start: 2020-08-17 — End: 2020-08-17
  Administered 2020-08-17: 18:00:00 via ORAL

## 2020-08-22 MED FILL — GUANFACINE HCL ER 4MG TB24: 4 mg | 30 days supply | Qty: 30 | Fill #0 | Status: AC

## 2020-09-07 ENCOUNTER — Inpatient Hospital Stay: Admit: 2020-09-07 | Payer: BLUE CROSS/BLUE SHIELD | Attending: Gastroenterology | Primary: Obstetrics & Gynecology

## 2020-09-07 ENCOUNTER — Encounter

## 2020-09-07 DIAGNOSIS — R1031 Right lower quadrant pain: Secondary | ICD-10-CM

## 2020-09-07 LAB — METABOLIC PANEL, COMPREHENSIVE
A-G Ratio: 1.1 — ABNORMAL LOW (ref 1.2–3.5)
ALT (SGPT): 19 U/L (ref 12–65)
AST (SGOT): 17 U/L (ref 15–37)
Albumin: 4.1 g/dL (ref 3.5–5.0)
Alk. phosphatase: 97 U/L (ref 50–130)
Anion gap: 8 mmol/L (ref 7–16)
BUN: 7 MG/DL (ref 6–23)
Bilirubin, total: 0.5 MG/DL (ref 0.2–1.1)
CO2: 27 mmol/L (ref 21–32)
Calcium: 9.5 MG/DL (ref 8.3–10.4)
Chloride: 104 mmol/L (ref 98–107)
Creatinine: 1 MG/DL (ref 0.6–1.0)
GFR est AA: >60 mL/min/{1.73_m2} (ref >60)
GFR est non-AA: >60 mL/min/{1.73_m2} (ref >60)
Globulin: 3.6 g/dL — ABNORMAL HIGH (ref 2.3–3.5)
Glucose: 133 mg/dL — ABNORMAL HIGH (ref 65–100)
Potassium: 3.3 mmol/L — ABNORMAL LOW (ref 3.5–5.1)
Protein, total: 7.7 g/dL (ref 6.3–8.2)
Sodium: 139 mmol/L (ref 136–145)

## 2020-09-07 LAB — CBC WITH AUTOMATED DIFF
ABS. BASOPHILS: 0 10*3/uL (ref 0.0–0.2)
ABS. EOSINOPHILS: 0.1 10*3/uL (ref 0.0–0.8)
ABS. IMM. GRANS.: 0 10*3/uL (ref 0.0–0.5)
ABS. LYMPHOCYTES: 1.7 10*3/uL (ref 0.5–4.6)
ABS. MONOCYTES: 0.5 10*3/uL (ref 0.1–1.3)
ABS. NEUTROPHILS: 3.3 10*3/uL (ref 1.7–8.2)
ABSOLUTE NRBC: 0 10*3/uL (ref 0.0–0.2)
BASOPHILS: 1 % (ref 0.0–2.0)
EOSINOPHILS: 1 % (ref 0.5–7.8)
HCT: 41.1 % (ref 35.8–46.3)
HGB: 13.7 g/dL (ref 11.7–15.4)
IMMATURE GRANULOCYTES: 0 % (ref 0.0–5.0)
LYMPHOCYTES: 30 % (ref 13–44)
MCH: 28.5 PG (ref 26.1–32.9)
MCHC: 33.3 g/dL (ref 31.4–35.0)
MCV: 85.6 FL (ref 79.6–97.8)
MONOCYTES: 8 % (ref 4.0–12.0)
MPV: 10.9 FL (ref 9.4–12.3)
NEUTROPHILS: 59 % (ref 43–78)
PLATELET: 247 10*3/uL (ref 150–450)
RBC: 4.8 M/uL (ref 4.05–5.2)
RDW: 13.2 % (ref 11.9–14.6)
WBC: 5.6 10*3/uL (ref 4.3–11.1)

## 2020-09-07 LAB — LIPASE
Lipase: 84 U/L (ref 73–393)
Lipase: 84 U/L (ref 73–393)

## 2020-09-07 LAB — CBC WITH AUTO DIFFERENTIAL
Basophils %: 1 % (ref 0.0–2.0)
Basophils Absolute: 0 10*3/uL (ref 0.0–0.2)
Eosinophils %: 1 % (ref 0.5–7.8)
Eosinophils Absolute: 0.1 10*3/uL (ref 0.0–0.8)
Granulocyte Absolute Count: 0 10*3/uL (ref 0.0–0.5)
Hematocrit: 41.1 % (ref 35.8–46.3)
Hemoglobin: 13.7 g/dL (ref 11.7–15.4)
Immature Granulocytes: 0 % (ref 0.0–5.0)
Lymphocytes %: 30 % (ref 13–44)
Lymphocytes Absolute: 1.7 10*3/uL (ref 0.5–4.6)
MCH: 28.5 PG (ref 26.1–32.9)
MCHC: 33.3 g/dL (ref 31.4–35.0)
MCV: 85.6 FL (ref 79.6–97.8)
MPV: 10.9 FL (ref 9.4–12.3)
Monocytes %: 8 % (ref 4.0–12.0)
Monocytes Absolute: 0.5 10*3/uL (ref 0.1–1.3)
NRBC Absolute: 0 10*3/uL (ref 0.0–0.2)
Neutrophils %: 59 % (ref 43–78)
Neutrophils Absolute: 3.3 10*3/uL (ref 1.7–8.2)
Platelets: 247 10*3/uL (ref 150–450)
RBC: 4.8 M/uL (ref 4.05–5.2)
RDW: 13.2 % (ref 11.9–14.6)
WBC: 5.6 10*3/uL (ref 4.3–11.1)

## 2020-09-07 LAB — COMPREHENSIVE METABOLIC PANEL
ALT: 19 U/L (ref 12–65)
AST: 17 U/L (ref 15–37)
Albumin/Globulin Ratio: 1.1 — ABNORMAL LOW (ref 1.2–3.5)
Albumin: 4.1 g/dL (ref 3.5–5.0)
Alkaline Phosphatase: 97 U/L (ref 50–130)
Anion Gap: 8 mmol/L (ref 7–16)
BUN: 7 MG/DL (ref 6–23)
CO2: 27 mmol/L (ref 21–32)
Calcium: 9.5 MG/DL (ref 8.3–10.4)
Chloride: 104 mmol/L (ref 98–107)
Creatinine: 1 MG/DL (ref 0.6–1.0)
EGFR IF NonAfrican American: >60 mL/min/{1.73_m2} (ref >60)
GFR African American: >60 mL/min/{1.73_m2} (ref >60)
Globulin: 3.6 g/dL — ABNORMAL HIGH (ref 2.3–3.5)
Glucose: 133 mg/dL — ABNORMAL HIGH (ref 65–100)
Potassium: 3.3 mmol/L — ABNORMAL LOW (ref 3.5–5.1)
Sodium: 139 mmol/L (ref 136–145)
Total Bilirubin: 0.5 MG/DL (ref 0.2–1.1)
Total Protein: 7.7 g/dL (ref 6.3–8.2)

## 2020-09-07 MED ORDER — DIATRIZOATE MEGLUMINE & SODIUM 66 %-10 % ORAL SOLN
66-10 % | Freq: Once | ORAL | Status: AC
Start: 2020-09-07 — End: 2020-09-07
  Administered 2020-09-07: 16:00:00 via ORAL

## 2020-09-07 MED ORDER — SODIUM CHLORIDE 0.9% BOLUS IV
0.9 % | Freq: Once | INTRAVENOUS | Status: AC
Start: 2020-09-07 — End: 2020-09-07
  Administered 2020-09-07: 16:00:00 via INTRAVENOUS

## 2020-09-07 MED ORDER — SALINE PERIPHERAL FLUSH PRN
Freq: Once | INTRAMUSCULAR | Status: AC
Start: 2020-09-07 — End: 2020-09-07
  Administered 2020-09-07: 16:00:00

## 2020-09-07 MED ORDER — IOPAMIDOL 76 % IV SOLN
370 mg iodine /mL (76 %) | Freq: Once | INTRAVENOUS | Status: AC
Start: 2020-09-07 — End: 2020-09-07
  Administered 2020-09-07: 16:00:00 via INTRAVENOUS

## 2020-09-12 NOTE — Interval H&P Note (Signed)
Patient verified name, DOB, and procedure.    Type: 1a; abbreviated assessment per anesthesia guidelines    Labs per anesthesia: none    Instructed pt that they will be notified the day before their procedure by the GI Lab for time of arrival if their procedure is [facility] and Pre-op for [place] cases. Arrival times should be called by 5 pm. If no phone is received the patient should contact their respective hospital. The GI lab telephone number is [phone] and ES Pre-op is [phone].     Follow diet and prep instructions per office including NPO status.  If patient has NOT received instructions from office patient is advised to call surgeon office, verbalizes understanding.    Bath or shower the night before and the am of surgery with non-mositurizing soap. No lotions, oils, powders, cologne on skin. No make up, eye make up or jewelry. Wear loose fitting comfortable, clean clothing.     Must have adult present in building the entire time .     Medications for the day of procedure  Intuniv, Prevacid, Vyvanse, zofran or phenergan if needed, sertraline  Patient to hold all vitamins,supplements and nsaids

## 2020-09-13 ENCOUNTER — Inpatient Hospital Stay: Primary: Obstetrics & Gynecology

## 2020-09-14 NOTE — Progress Notes (Signed)
Informed patient of 1030 arrival time for 1200 procedure. Informed patient of COVID protocols. Informed patient that they must have a driver with them the entire time that they are in the hospital. Patient verbalized understanding.

## 2020-09-17 ENCOUNTER — Inpatient Hospital Stay: Payer: BLUE CROSS/BLUE SHIELD

## 2020-09-17 LAB — CBC WITH AUTOMATED DIFF
ABS. BASOPHILS: 0 10*3/uL (ref 0.0–0.2)
ABS. EOSINOPHILS: 0.1 10*3/uL (ref 0.0–0.8)
ABS. IMM. GRANS.: 0 10*3/uL (ref 0.0–0.5)
ABS. LYMPHOCYTES: 1.2 10*3/uL (ref 0.5–4.6)
ABS. MONOCYTES: 0.6 10*3/uL (ref 0.1–1.3)
ABS. NEUTROPHILS: 3.7 10*3/uL (ref 1.7–8.2)
ABSOLUTE NRBC: 0 10*3/uL (ref 0.0–0.2)
BASOPHILS: 1 % (ref 0.0–2.0)
EOSINOPHILS: 2 % (ref 0.5–7.8)
HCT: 36 % (ref 35.8–46.3)
HGB: 11.9 g/dL (ref 11.7–15.4)
IMMATURE GRANULOCYTES: 0 % (ref 0.0–5.0)
LYMPHOCYTES: 22 % (ref 13–44)
MCH: 28.3 PG (ref 26.1–32.9)
MCHC: 33.1 g/dL (ref 31.4–35.0)
MCV: 85.7 FL (ref 79.6–97.8)
MONOCYTES: 10 % (ref 4.0–12.0)
MPV: 10.6 FL (ref 9.4–12.3)
NEUTROPHILS: 66 % (ref 43–78)
PLATELET: 211 10*3/uL (ref 150–450)
RBC: 4.2 M/uL (ref 4.05–5.2)
RDW: 13.3 % (ref 11.9–14.6)
WBC: 5.7 10*3/uL (ref 4.3–11.1)

## 2020-09-17 LAB — C REACTIVE PROTEIN, QT: C-Reactive protein: <0.3 mg/dL (ref 0.0–0.9)

## 2020-09-17 LAB — METABOLIC PANEL, COMPREHENSIVE
A-G Ratio: 1.1 — ABNORMAL LOW (ref 1.2–3.5)
ALT (SGPT): 23 U/L (ref 12–65)
AST (SGOT): 17 U/L (ref 15–37)
Albumin: 3.1 g/dL — ABNORMAL LOW (ref 3.5–5.0)
Alk. phosphatase: 82 U/L (ref 50–136)
Anion gap: 4 mmol/L — ABNORMAL LOW (ref 7–16)
BUN: 6 MG/DL (ref 6–23)
Bilirubin, total: 0.3 MG/DL (ref 0.2–1.1)
CO2: 29 mmol/L (ref 21–32)
Calcium: 8.5 MG/DL (ref 8.3–10.4)
Chloride: 107 mmol/L (ref 98–107)
Creatinine: 0.8 MG/DL (ref 0.6–1.0)
GFR est AA: >60 mL/min/{1.73_m2} (ref >60)
GFR est non-AA: >60 mL/min/{1.73_m2} (ref >60)
Globulin: 2.8 g/dL (ref 2.3–3.5)
Glucose: 101 mg/dL — ABNORMAL HIGH (ref 65–100)
Potassium: 3.5 mmol/L (ref 3.5–5.1)
Protein, total: 5.9 g/dL — ABNORMAL LOW (ref 6.3–8.2)
Sodium: 140 mmol/L (ref 136–145)

## 2020-09-17 LAB — SED RATE, AUTOMATED: Sed rate, automated: 4 mm/hr (ref 0–20)

## 2020-09-17 LAB — LIPASE
Lipase: 94 U/L (ref 73–393)
Lipase: 94 U/L (ref 73–393)

## 2020-09-17 LAB — CBC WITH AUTO DIFFERENTIAL
Basophils %: 1 % (ref 0.0–2.0)
Basophils Absolute: 0 10*3/uL (ref 0.0–0.2)
Eosinophils %: 2 % (ref 0.5–7.8)
Eosinophils Absolute: 0.1 10*3/uL (ref 0.0–0.8)
Granulocyte Absolute Count: 0 10*3/uL (ref 0.0–0.5)
Hematocrit: 36 % (ref 35.8–46.3)
Hemoglobin: 11.9 g/dL (ref 11.7–15.4)
Immature Granulocytes: 0 % (ref 0.0–5.0)
Lymphocytes %: 22 % (ref 13–44)
Lymphocytes Absolute: 1.2 10*3/uL (ref 0.5–4.6)
MCH: 28.3 PG (ref 26.1–32.9)
MCHC: 33.1 g/dL (ref 31.4–35.0)
MCV: 85.7 FL (ref 79.6–97.8)
MPV: 10.6 FL (ref 9.4–12.3)
Monocytes %: 10 % (ref 4.0–12.0)
Monocytes Absolute: 0.6 10*3/uL (ref 0.1–1.3)
NRBC Absolute: 0 10*3/uL (ref 0.0–0.2)
Neutrophils %: 66 % (ref 43–78)
Neutrophils Absolute: 3.7 10*3/uL (ref 1.7–8.2)
Platelets: 211 10*3/uL (ref 150–450)
RBC: 4.2 M/uL (ref 4.05–5.2)
RDW: 13.3 % (ref 11.9–14.6)
WBC: 5.7 10*3/uL (ref 4.3–11.1)

## 2020-09-17 LAB — COMPREHENSIVE METABOLIC PANEL
ALT: 23 U/L (ref 12–65)
AST: 17 U/L (ref 15–37)
Albumin/Globulin Ratio: 1.1 — ABNORMAL LOW (ref 1.2–3.5)
Albumin: 3.1 g/dL — ABNORMAL LOW (ref 3.5–5.0)
Alkaline Phosphatase: 82 U/L (ref 50–136)
Anion Gap: 4 mmol/L — ABNORMAL LOW (ref 7–16)
BUN: 6 MG/DL (ref 6–23)
CO2: 29 mmol/L (ref 21–32)
Calcium: 8.5 MG/DL (ref 8.3–10.4)
Chloride: 107 mmol/L (ref 98–107)
Creatinine: 0.8 MG/DL (ref 0.6–1.0)
EGFR IF NonAfrican American: >60 mL/min/{1.73_m2} (ref >60)
GFR African American: >60 mL/min/{1.73_m2} (ref >60)
Globulin: 2.8 g/dL (ref 2.3–3.5)
Glucose: 101 mg/dL — ABNORMAL HIGH (ref 65–100)
Potassium: 3.5 mmol/L (ref 3.5–5.1)
Sodium: 140 mmol/L (ref 136–145)
Total Bilirubin: 0.3 MG/DL (ref 0.2–1.1)
Total Protein: 5.9 g/dL — ABNORMAL LOW (ref 6.3–8.2)

## 2020-09-17 LAB — SEDIMENTATION RATE, AUTOMATED: Sed Rate: 4 mm/hr (ref 0–20)

## 2020-09-17 LAB — C-REACTIVE PROTEIN: CRP: <0.3 mg/dL (ref 0.0–0.9)

## 2020-09-17 MED ORDER — LIDOCAINE (PF) 20 MG/ML (2 %) IJ SOLN
20 mg/mL (2 %) | INTRAMUSCULAR | Status: DC | PRN
Start: 2020-09-17 — End: 2020-09-17
  Administered 2020-09-17: 16:00:00 via INTRAVENOUS

## 2020-09-17 MED ORDER — PROPOFOL 10 MG/ML IV EMUL
10 mg/mL | INTRAVENOUS | Status: DC | PRN
Start: 2020-09-17 — End: 2020-09-17
  Administered 2020-09-17: 16:00:00 via INTRAVENOUS

## 2020-09-17 MED ORDER — LACTATED RINGERS IV
INTRAVENOUS | Status: DC
Start: 2020-09-17 — End: 2020-09-17
  Administered 2020-09-17 (×2): via INTRAVENOUS

## 2020-09-17 MED ORDER — LACTATED RINGERS IV
INTRAVENOUS | Status: DC
Start: 2020-09-17 — End: 2020-09-17

## 2020-09-17 NOTE — H&P (Signed)
Gastroenterology Outpatient History and Physical    Patient: Robin Mendoza    Physician: Flint Melter, MD    Chief Complaint: RLQ pain    History of Present Illness: Abnormal CT- possible Crohns    Justification for Procedure: As above    History:  Past Medical History:   Diagnosis Date   ??? ADHD     managed with meds   ??? Autism       Past Surgical History:   Procedure Laterality Date   ??? COLONOSCOPY N/A 01/14/2019    COLONOSCOPY/  BMI 21 PT IS AUTISTIC performed by Flint Melter, MD at [facility] ENDOSCOPY   ??? HX OTHER SURGICAL      EGD      Social History     Socioeconomic History   ??? Marital status: SINGLE   Tobacco Use   ??? Smoking status: Never Smoker   ??? Smokeless tobacco: Never Used   Substance and Sexual Activity   ??? Alcohol use: Not Currently   ??? Drug use: Never      Family History   Problem Relation Age of Onset   ??? Cancer Mother         bilateral breast cancer   ??? Hypertension Mother    ??? No Known Problems Father        Allergies:   Allergies   Allergen Reactions   ??? Milk Anaphylaxis   ??? Milk Containing Products Anaphylaxis   ??? Cat Dander Itching   ??? Dog Dander Itching       Medications:   Prior to Admission medications    Medication Sig Start Date End Date Taking? Authorizing Provider   promethazine (PHENERGAN) 25 mg tablet Take 25 mg by mouth every six (6) hours as needed for Nausea.    Provider, Historical   lansoprazole (Prevacid) 30 mg capsule Take 30 mg by mouth two (2) times a day.    Provider, Historical   PREDNISONE PO Take  by mouth daily. Unsure of dose    Provider, Historical   ondansetron (ZOFRAN ODT) 4 mg disintegrating tablet Take 1 Tablet by mouth every eight (8) hours as needed for Nausea. 10/22/19   Rose[place], Alfonso Patten, DO   sertraline HCl (SERTRALINE PO) Take 50 mg by mouth daily.    Provider, Historical   guanfacine HCl (INTUNIV ER PO) Take 4 mg by mouth daily.    Provider, Historical   lisdexamfetamine dimesylate (VYVANSE PO) Take  by mouth daily.    Provider,  Historical       Vital Signs: There were no vitals taken for this visit.    Physical Exam:   General: alert      Heart: regular rate and rhythm   Lungs: no tachypnea, retractions or cyanosis, Heart exam - S1, S2 normal, no murmur, no gallop, rate regular   Abdominal: Bowel sounds are normal, soft, non distended             Plan of Care/Planned Procedure: Colonoscopy    Signed:  Flint Melter, MD 09/17/2020

## 2020-09-17 NOTE — Procedures (Signed)
Colonoscopy Procedure Note    PreOp Diagnosis:   RLQ pain  Altered bowel habits  Abnormal CT  Weight loss  Rectal bleeding    PostOp Diagnosis:  Normal colonoscopy    Medications:  Monitored Anesthesia    Procedure:  Colonoscopy  Instrument: P CFQ 180 AL  After informed consent was obtained, the patient was sedated and the colonoscope was inserted  in the anus and advanced into the cecum without difficulty.  The scope was slowly withdrawn while the mucosa was carefully inspected, including a retroflexed view of the rectum.     Prep: Poor    Findings:   TERMINAL ILEUM:  The terminal ileum was entered and appeared normal.  There were no inflammatory changes and the mucosa appeared normal.  Biopsies were obtained to rule out quiescent ileitis     COLON:  The appendiceal orifice, cecum and ileocecal valve were positively identified.  Retroflexion was performed in the cecum and ascending colon.  The colon was carefully examined on slow withdrawal from the cecum to the rectum.  There were no polyps, diverticula, masses, inflammatory changes, or vascular malformations.   The bowel prep was poor, with moderate amount of fluid and some solid material present much cannot be completely cleared. Random biopsies were obtained throughout the right and left colon to rule out microscopic colitis.     RECTUM:  The rectal mucosa on antegrade and retroflexed views were limited by the prep, but appeared normal without inflammation, polyps or mass lesions.  The digital rectal exam was unremarkable.     Recommendations:  Check pathology  DC prednisone  Miralax daily if needed for BM  Repeat labs today  Further recommendations based on results.   Cont Bentyl as needed for pain.     Lorie Apley, MD

## 2020-09-17 NOTE — Anesthesia Post-Procedure Evaluation (Signed)
Procedure(s):  COLONOSCOPY/BMI 21  COLON BIOPSY.    total IV anesthesia    Anesthesia Post Evaluation      Multimodal analgesia: multimodal analgesia used between 6 hours prior to anesthesia start to PACU discharge  Patient location during evaluation: bedside  Patient participation: complete - patient participated  Level of consciousness: awake and responsive to light touch  Pain management: adequate  Airway patency: patent  Anesthetic complications: no  Cardiovascular status: acceptable, hemodynamically stable, blood pressure returned to baseline and stable  Respiratory status: acceptable, unassisted, spontaneous ventilation and nonlabored ventilation  Hydration status: acceptable  Post anesthesia nausea and vomiting:  controlled  Final Post Anesthesia Temperature Assessment:  Normothermia (36.0-37.5 degrees C)      INITIAL Post-op Vital signs:   Vitals Value Taken Time   BP 111/76 09/17/20 1410   Temp 36.5 ??C (97.7 ??F) 09/17/20 1251   Pulse 74 09/17/20 1411   Resp 14 09/17/20 1410   SpO2 100 % 09/17/20 1412   Vitals shown include unvalidated device data.

## 2020-09-17 NOTE — Anesthesia Pre-Procedure Evaluation (Signed)
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    09/17/20 mom is a [facility]  I let her come back to the room, and let patient push her initial induction dose of propofol .           Physical Exam    Airway  Mallampati: I  TM Distance: 4 - 6 cm  Neck ROM: normal range of motion        Cardiovascular  Regular rate and rhythm,  S1 and S2 normal,  no murmur, click, rub, or gallop  Rhythm: regular  Rate: normal         Dental  No notable dental hx       Pulmonary  Breath sounds clear to auscultation               Abdominal  Abdominal exam normal       Other Findings            Anesthetic Plan    ASA: 2  Anesthesia type: total IV anesthesia          Induction: Intravenous  Anesthetic plan and risks discussed with: Patient

## 2020-10-02 ENCOUNTER — Ambulatory Visit: Admit: 2020-10-02 | Discharge: 2020-10-02 | Payer: BLUE CROSS/BLUE SHIELD | Attending: Surgery

## 2020-10-02 DIAGNOSIS — R1011 Right upper quadrant pain: Secondary | ICD-10-CM

## 2020-10-02 NOTE — Progress Notes (Signed)
Azalia Bilis, MD, [address], [address]  [place]  [address]  Phone [phone]   Fax [phone]      Date of visit: 10/02/2020     Primary/Requesting provider: NOT ON FILE    Chief Complaint   Patient presents with   ??? New Patient     abdominal pain       .3new

## 2020-10-02 NOTE — H&P (View-Only) (Signed)
Patient is a 20 y.o. female who presents for consideration of cholecystectomy in referral from GI Robin Mendoza), with her mother Robin Beaver, MD (GYN).  History is gathered from pt and mother due to pt autism and anxiety.  Robin Mendoza reports right-sided abdominal pain for the past 2 months, which is quite similar to pain she had last year.  She has known EoE and an anaphylactic milk allergy but current symptoms are different.  She had been much better since last year until a severe allergic reaction episode which seemed to trigger recurrence of the symptoms.  She reports constant right sided pain, both RUQ and RLQ.  Mom senses a possible post-prandial exacerbation pattern but not any specific trigger foods.  Additional symptoms include weight loss due to food fear, constant nausea, some emesis,  And intermittent diarrhea.  She is not experiencing fever, chills, jaundice, melena/BRBPR.  She has been extensively evaluated by GI with no reported abnormalities other than a borderline low GBEF.  She was seen by surgery last year [facility]) but was anxious about having surgery and symptoms improved so cholecystectomy was not pursued then.    Medications:   Current Outpatient Medications on File Prior to Visit   Medication Sig Dispense Refill   ??? ondansetron (ZOFRAN-ODT) 4 MG disintegrating tablet Take 4 mg by mouth every 8 hours as needed       No current facility-administered medications on file prior to visit.        Allergies:   Allergies   Allergen Reactions   ??? Lac Bovis Anaphylaxis   ??? Dog Epithelium Allergy Skin Test Itching        Past History:  Past Medical History:   Diagnosis Date   ??? ADHD     managed with meds   ??? Autism       Past Surgical History:   Procedure Laterality Date   ??? COLONOSCOPY N/A 09/17/2020    COLONOSCOPY/BMI 21 performed by Robin Melter, MD at [facility] ENDOSCOPY   ??? COLONOSCOPY N/A 01/14/2019    COLONOSCOPY/  BMI 21 PT IS AUTISTIC performed by Robin Melter, MD at [facility] ENDOSCOPY   ???  OTHER SURGICAL HISTORY      EGD        Family and Social History:  Family History   Problem Relation Age of Onset   ??? Cancer Mother         bilateral breast cancer   ??? No Known Problems Father    ??? Hypertension Mother      Social History     Socioeconomic History   ??? Marital status: Single     Spouse name: Not on file   ??? Number of children: Not on file   ??? Years of education: Not on file   ??? Highest education level: Not on file   Occupational History   ??? Not on file   Tobacco Use   ??? Smoking status: Never Smoker   ??? Smokeless tobacco: Never Used   Substance and Sexual Activity   ??? Alcohol use: Not Currently   ??? Drug use: Never   ??? Sexual activity: Not on file   Other Topics Concern   ??? Not on file   Social History Narrative   ??? Not on file     Social Determinants of Health     Financial Resource Strain:    ??? Difficulty of Paying Living Expenses: Not on file   Food Insecurity:    ??? Worried About Running Out  of Food in the Last Year: Not on file   ??? Ran Out of Food in the Last Year: Not on file   Transportation Needs:    ??? Lack of Transportation (Medical): Not on file   ??? Lack of Transportation (Non-Medical): Not on file   Physical Activity:    ??? Days of Exercise per Week: Not on file   ??? Minutes of Exercise per Session: Not on file   Stress:    ??? Feeling of Stress : Not on file   Social Connections:    ??? Frequency of Communication with Friends and Family: Not on file   ??? Frequency of Social Gatherings with Friends and Family: Not on file   ??? Attends Religious Services: Not on file   ??? Active Member of Clubs or Organizations: Not on file   ??? Attends Archivist Meetings: Not on file   ??? Marital Status: Not on file   Intimate Partner Violence:    ??? Fear of Current or Ex-Partner: Not on file   ??? Emotionally Abused: Not on file   ??? Physically Abused: Not on file   ??? Sexually Abused: Not on file   Housing Stability:    ??? Unable to Pay for Housing in the Last Year: Not on file   ??? Number of Places Lived in the  Last Year: Not on file   ??? Unstable Housing in the Last Year: Not on file      Review of Systems     Physical Exam  Vitals and nursing note reviewed.   Constitutional:       General: She is not in acute distress.     Appearance: Normal appearance. She is not ill-appearing.   HENT:      Head: Normocephalic and atraumatic.   Eyes:      General: No scleral icterus.     Pupils: Pupils are equal, round, and reactive to light.   Cardiovascular:      Rate and Rhythm: Normal rate.   Pulmonary:      Effort: Pulmonary effort is normal. No respiratory distress.      Breath sounds: No stridor.   Abdominal:      General: There is no distension.      Palpations: There is no mass.      Tenderness: There is abdominal tenderness (RUQ>RLQ). There is no rebound. Guarding: voluntary?      Hernia: No hernia is present.   Skin:     General: Skin is warm and dry.      Coloration: Skin is not jaundiced.   Neurological:      General: No focal deficit present.      Mental Status: She is alert and oriented to person, place, and time.      Cranial Nerves: No cranial nerve deficit.   Psychiatric:         Mood and Affect: Mood normal.         Behavior: Behavior normal.         Thought Content: Thought content normal.      Comments: anxious       Data:  Outside records from GI reviewed including several office notes, endoscopy reports, labs.     HIDA done here 11/02/19 reveals GBEF 36%; pt denies pain reproduction  EGD, colo negative  CT with thickening of colon, not confirmed on colo    1. RUQ pain    2. Chronic RLQ pain    3. Abnormal biliary  HIDA scan    4. Autism       Discussed rationale for cholecystectomy (and appendectomy) with pt and mother- recurrence and chronicity of symptoms, in absence of other identified abnormality on workup to date, point towards a chronic gallbladder abnormality.  Chronic appendicitis could also contribute.  Thoroughly discussed the idea of cholecystectomy (and appendectomy) as a therapeutic trial- symptom relief  cannot be guaranteed but experience would suggest a significant chance of benefit, against a very low risk profile.    Will proceed with cholecystectomy and likely appendectomy as well.  Technical details of the procedure are reviewed, including the non-standard single incision (SILS) technique if indicated.  Risks reviewed include anesthetic risks, bleeding, infection, visceral injury including bile leak, incomplete symptom resolution and persistent post-operative diarrhea as well as conversion to open technique.  All questions are answered.

## 2020-10-02 NOTE — Progress Notes (Signed)
Patient is a 20 y.o. female who presents for consideration of cholecystectomy in referral from GI [name]), with her mother Robin Beaver, MD (GYN).  History is gathered from pt and mother due to pt autism and anxiety.  Robin Mendoza reports right-sided abdominal pain for the past 2 months, which is quite similar to pain she had last year.  She has known EoE and an anaphylactic milk allergy but current symptoms are different.  She had been much better since last year until a severe allergic reaction episode which seemed to trigger recurrence of the symptoms.  She reports constant right sided pain, both RUQ and RLQ.  Mom senses a possible post-prandial exacerbation pattern but not any specific trigger foods.  Additional symptoms include weight loss due to food fear, constant nausea, some emesis,  And intermittent diarrhea.  She is not experiencing fever, chills, jaundice, melena/BRBPR.  She has been extensively evaluated by GI with no reported abnormalities other than a borderline low GBEF.  She was seen by surgery last year [facility]) but was anxious about having surgery and symptoms improved so cholecystectomy was not pursued then.    Medications:   Current Outpatient Medications on File Prior to Visit   Medication Sig Dispense Refill   ??? ondansetron (ZOFRAN-ODT) 4 MG disintegrating tablet Take 4 mg by mouth every 8 hours as needed       No current facility-administered medications on file prior to visit.        Allergies:   Allergies   Allergen Reactions   ??? Lac Bovis Anaphylaxis   ??? Dog Epithelium Allergy Skin Test Itching        Past History:  Past Medical History:   Diagnosis Date   ??? ADHD     managed with meds   ??? Autism       Past Surgical History:   Procedure Laterality Date   ??? COLONOSCOPY N/A 09/17/2020    COLONOSCOPY/BMI 21 performed by Flint Melter, MD at [facility] ENDOSCOPY   ??? COLONOSCOPY N/A 01/14/2019    COLONOSCOPY/  BMI 21 PT IS AUTISTIC performed by Flint Melter, MD at [facility] ENDOSCOPY   ???  OTHER SURGICAL HISTORY      EGD        Family and Social History:  Family History   Problem Relation Age of Onset   ??? Cancer Mother         bilateral breast cancer   ??? No Known Problems Father    ??? Hypertension Mother      Social History     Socioeconomic History   ??? Marital status: Single     Spouse name: Not on file   ??? Number of children: Not on file   ??? Years of education: Not on file   ??? Highest education level: Not on file   Occupational History   ??? Not on file   Tobacco Use   ??? Smoking status: Never Smoker   ??? Smokeless tobacco: Never Used   Substance and Sexual Activity   ??? Alcohol use: Not Currently   ??? Drug use: Never   ??? Sexual activity: Not on file   Other Topics Concern   ??? Not on file   Social History Narrative   ??? Not on file     Social Determinants of Health     Financial Resource Strain:    ??? Difficulty of Paying Living Expenses: Not on file   Food Insecurity:    ??? Worried About Running Out  of Food in the Last Year: Not on file   ??? Ran Out of Food in the Last Year: Not on file   Transportation Needs:    ??? Lack of Transportation (Medical): Not on file   ??? Lack of Transportation (Non-Medical): Not on file   Physical Activity:    ??? Days of Exercise per Week: Not on file   ??? Minutes of Exercise per Session: Not on file   Stress:    ??? Feeling of Stress : Not on file   Social Connections:    ??? Frequency of Communication with Friends and Family: Not on file   ??? Frequency of Social Gatherings with Friends and Family: Not on file   ??? Attends Religious Services: Not on file   ??? Active Member of Clubs or Organizations: Not on file   ??? Attends Archivist Meetings: Not on file   ??? Marital Status: Not on file   Intimate Partner Violence:    ??? Fear of Current or Ex-Partner: Not on file   ??? Emotionally Abused: Not on file   ??? Physically Abused: Not on file   ??? Sexually Abused: Not on file   Housing Stability:    ??? Unable to Pay for Housing in the Last Year: Not on file   ??? Number of Places Lived in the  Last Year: Not on file   ??? Unstable Housing in the Last Year: Not on file      Review of Systems     Physical Exam  Vitals and nursing note reviewed.   Constitutional:       General: She is not in acute distress.     Appearance: Normal appearance. She is not ill-appearing.   HENT:      Head: Normocephalic and atraumatic.   Eyes:      General: No scleral icterus.     Pupils: Pupils are equal, round, and reactive to light.   Cardiovascular:      Rate and Rhythm: Normal rate.   Pulmonary:      Effort: Pulmonary effort is normal. No respiratory distress.      Breath sounds: No stridor.   Abdominal:      General: There is no distension.      Palpations: There is no mass.      Tenderness: There is abdominal tenderness (RUQ>RLQ). There is no rebound. Guarding: voluntary?      Hernia: No hernia is present.   Skin:     General: Skin is warm and dry.      Coloration: Skin is not jaundiced.   Neurological:      General: No focal deficit present.      Mental Status: She is alert and oriented to person, place, and time.      Cranial Nerves: No cranial nerve deficit.   Psychiatric:         Mood and Affect: Mood normal.         Behavior: Behavior normal.         Thought Content: Thought content normal.      Comments: anxious       Data:  Outside records from GI reviewed including several office notes, endoscopy reports, labs.     HIDA done here 11/02/19 reveals GBEF 36%; pt denies pain reproduction  EGD, colo negative  CT with thickening of colon, not confirmed on colo    1. RUQ pain    2. Chronic RLQ pain    3. Abnormal biliary  HIDA scan    4. Autism       Discussed rationale for cholecystectomy (and appendectomy) with pt and mother- recurrence and chronicity of symptoms, in absence of other identified abnormality on workup to date, point towards a chronic gallbladder abnormality.  Chronic appendicitis could also contribute.  Thoroughly discussed the idea of cholecystectomy (and appendectomy) as a therapeutic trial- symptom relief  cannot be guaranteed but experience would suggest a significant chance of benefit, against a very low risk profile.    Will proceed with cholecystectomy and likely appendectomy as well.  Technical details of the procedure are reviewed, including the non-standard single incision (SILS) technique if indicated.  Risks reviewed include anesthetic risks, bleeding, infection, visceral injury including bile leak, incomplete symptom resolution and persistent post-operative diarrhea as well as conversion to open technique.  All questions are answered.

## 2020-10-03 NOTE — Other (Signed)
Patient's mother Robin Mendoza verified name and DOB.  Order for consent NOT found in EHR and UNABLE to match case posting; patient's mother Robin Mendoza) verifies procedure.   Type 2 surgery, phone assessment complete.  Orders NOT received.  Labs per surgeon: NONE  Labs per anesthesia protocol: HGB s/h dos (unable to come prior)    Patient answered medical/surgical history questions at their best of ability. All prior to admission medications documented in [facility].  Patient instructed to take the following medications the day of surgery according to anesthesia guidelines with a small sip of water: Ituniv, Vyvanse, Sertraline, Famotidine, Zofran if needed. On the day before surgery please take Acetaminophen 1000mg  in the morning and then again before bed. You may substitute for Tylenol 650 mg. Hold all vitamins 7 days prior to surgery and NSAIDS 5 days prior to surgery. Prescription meds to hold: NONE  Patient instructed on the following:    > Arrive at MAIN Entrance, time of arrival to be called the day before by 1700  > NPO after midnight including gum, mints, and ice chips  > Responsible adult must drive patient to the hospital, stay during surgery, and patient will need supervision 24 hours after anesthesia  > Use antibacterial soap in shower the night before surgery and on the morning of surgery  > All piercings must be removed prior to arrival.    > Leave all valuables (money and jewelry) at home but bring insurance card and ID on DOS.   > You may be required to pay a deductible or co-pay on the day of your procedure. You can pre-pay by calling [phone] if your surgery is at the [facility] or [phone] if your surgery is at the [facility].  > Do not wear make-up, nail polish, lotions, cologne, perfumes, powders, or oil on skin. Artificial nails are not permitted.

## 2020-10-05 ENCOUNTER — Inpatient Hospital Stay: Payer: BLUE CROSS/BLUE SHIELD

## 2020-10-05 LAB — HEMOGLOBIN: Hemoglobin: 14.5 g/dL (ref 11.7–15.4)

## 2020-10-05 MED ORDER — MIDAZOLAM HCL 2 MG/2ML IJ SOLN
2 | INTRAMUSCULAR | Status: AC
Start: 2020-10-05 — End: 2020-10-05

## 2020-10-05 MED ORDER — LIDOCAINE HCL (PF) 2 % IJ SOLN
2 % | INTRAMUSCULAR | Status: DC | PRN
Start: 2020-10-05 — End: 2020-10-05
  Administered 2020-10-05: 20:00:00 80 via INTRAVENOUS

## 2020-10-05 MED ORDER — APREPITANT 40 MG PO CAPS
40 MG | Freq: Once | ORAL | Status: AC
Start: 2020-10-05 — End: 2020-10-05
  Administered 2020-10-05: 19:00:00 via ORAL

## 2020-10-05 MED ORDER — HYDROMORPHONE HCL PF 2 MG/ML IJ SOLN
2 MG/ML | INTRAMUSCULAR | Status: DC | PRN
Start: 2020-10-05 — End: 2020-10-06
  Administered 2020-10-05 (×2): 0.25 mg via INTRAVENOUS

## 2020-10-05 MED ORDER — FENTANYL CITRATE (PF) 100 MCG/2ML IJ SOLN
100 | INTRAMUSCULAR | Status: AC
Start: 2020-10-05 — End: 2020-10-05

## 2020-10-05 MED ORDER — LACTATED RINGERS IV SOLN
INTRAVENOUS | Status: DC
Start: 2020-10-05 — End: 2020-10-06
  Administered 2020-10-05 (×2): via INTRAVENOUS

## 2020-10-05 MED ORDER — ROCURONIUM BROMIDE 50 MG/5ML IV SOLN
50 | INTRAVENOUS | Status: AC
Start: 2020-10-05 — End: 2020-10-05

## 2020-10-05 MED ORDER — ONDANSETRON 8 MG PO TBDP
8 MG | ORAL_TABLET | Freq: Three times a day (TID) | ORAL | 0 refills | Status: DC | PRN
Start: 2020-10-05 — End: 2021-02-25

## 2020-10-05 MED ORDER — ONDANSETRON HCL 4 MG/2ML IJ SOLN
4 | INTRAMUSCULAR | Status: AC
Start: 2020-10-05 — End: 2020-10-05

## 2020-10-05 MED ORDER — OXYCODONE HCL 5 MG PO TABS
5 MG | ORAL | Status: AC | PRN
Start: 2020-10-05 — End: 2020-10-05
  Administered 2020-10-05: 23:00:00 10 mg via ORAL

## 2020-10-05 MED ORDER — GLUCOSE 4 G PO CHEW
4 | ORAL | Status: DC | PRN
Start: 2020-10-05 — End: 2020-10-06

## 2020-10-05 MED ORDER — ONDANSETRON HCL 4 MG/2ML IJ SOLN
4 MG/2ML | Freq: Once | INTRAMUSCULAR | Status: DC | PRN
Start: 2020-10-05 — End: 2020-10-05

## 2020-10-05 MED ORDER — DIPHENHYDRAMINE HCL 50 MG/ML IJ SOLN
50 MG/ML | INTRAMUSCULAR | Status: AC
Start: 2020-10-05 — End: 2020-10-05
  Administered 2020-10-05: via INTRAVENOUS

## 2020-10-05 MED ORDER — DEXAMETHASONE SODIUM PHOSPHATE 10 MG/ML IJ SOLN
10 MG/ML | INTRAMUSCULAR | Status: DC | PRN
Start: 2020-10-05 — End: 2020-10-05
  Administered 2020-10-05: 20:00:00 10 via INTRAVENOUS

## 2020-10-05 MED ORDER — DEXTROSE 10 % IV BOLUS
INTRAVENOUS | Status: DC | PRN
Start: 2020-10-05 — End: 2020-10-06

## 2020-10-05 MED ORDER — PHENYLEPHRINE HCL (PRESSORS) 10 MG/ML IV SOLN
10 | INTRAVENOUS | Status: DC | PRN
Start: 2020-10-05 — End: 2020-10-05
  Administered 2020-10-05: 20:00:00 via INTRAVENOUS

## 2020-10-05 MED ORDER — MIDAZOLAM HCL (PF) 2 MG/2ML IJ SOLN
2 MG/ML | Freq: Once | INTRAMUSCULAR | Status: DC | PRN
Start: 2020-10-05 — End: 2020-10-05

## 2020-10-05 MED ORDER — SUGAMMADEX SODIUM 500 MG/5ML IV SOLN
5005 MG/5ML | INTRAVENOUS | Status: DC | PRN
Start: 2020-10-05 — End: 2020-10-05
  Administered 2020-10-05: 21:00:00 200 via INTRAVENOUS

## 2020-10-05 MED ORDER — SUGAMMADEX SODIUM 200 MG/2ML IV SOLN
200 | INTRAVENOUS | Status: AC
Start: 2020-10-05 — End: 2020-10-05

## 2020-10-05 MED ORDER — PROPOFOL 200 MG/20ML IV EMUL
200 | INTRAVENOUS | Status: AC
Start: 2020-10-05 — End: 2020-10-05

## 2020-10-05 MED ORDER — ONDANSETRON HCL 4 MG/2ML IJ SOLN
4 MG/2ML | INTRAMUSCULAR | Status: DC | PRN
Start: 2020-10-05 — End: 2020-10-05
  Administered 2020-10-05 (×2): 4 via INTRAVENOUS

## 2020-10-05 MED ORDER — LIDOCAINE HCL (PF) 1 % IJ SOLN
1 % | Freq: Once | INTRAMUSCULAR | Status: DC | PRN
Start: 2020-10-05 — End: 2020-10-05

## 2020-10-05 MED ORDER — ACETAMINOPHEN 500 MG PO TABS
500 MG | Freq: Once | ORAL | Status: AC
Start: 2020-10-05 — End: 2020-10-05
  Administered 2020-10-05: 19:00:00 via ORAL

## 2020-10-05 MED ORDER — HYDROCODONE-ACETAMINOPHEN 5-325 MG PO TABS
5-325 MG | ORAL_TABLET | ORAL | 0 refills | Status: AC | PRN
Start: 2020-10-05 — End: 2020-10-10

## 2020-10-05 MED ORDER — OXYCODONE HCL 5 MG PO TABS
5 MG | ORAL | Status: AC | PRN
Start: 2020-10-05 — End: 2020-10-05

## 2020-10-05 MED ORDER — ROCURONIUM BROMIDE 50 MG/5ML IV SOLN
50 MG/5ML | INTRAVENOUS | Status: DC | PRN
Start: 2020-10-05 — End: 2020-10-05
  Administered 2020-10-05: 20:00:00 50 via INTRAVENOUS

## 2020-10-05 MED ORDER — HYDROMORPHONE HCL PF 2 MG/ML IJ SOLN
2 MG/ML | Freq: Once | INTRAMUSCULAR | Status: DC | PRN
Start: 2020-10-05 — End: 2020-10-05

## 2020-10-05 MED ORDER — DIPHENHYDRAMINE HCL 50 MG/ML IJ SOLN
50 MG/ML | INTRAMUSCULAR | Status: AC
Start: 2020-10-05 — End: 2020-10-05
  Administered 2020-10-05: 23:00:00 via INTRAVENOUS

## 2020-10-05 MED ORDER — LABETALOL HCL 5 MG/ML IV SOLN
5 | INTRAVENOUS | Status: DC | PRN
Start: 2020-10-05 — End: 2020-10-06

## 2020-10-05 MED ORDER — BUPIVACAINE-EPINEPHRINE 0.5% -1:200000 IJ SOLN
INTRAMUSCULAR | Status: DC | PRN
Start: 2020-10-05 — End: 2020-10-05
  Administered 2020-10-05: 20:00:00 30 via INTRADERMAL

## 2020-10-05 MED ORDER — HYDROMORPHONE HCL PF 2 MG/ML IJ SOLN
2 MG/ML | INTRAMUSCULAR | Status: AC | PRN
Start: 2020-10-05 — End: 2020-10-05
  Administered 2020-10-05 (×2): 0.5 mg via INTRAVENOUS

## 2020-10-05 MED ORDER — FENTANYL 0.05 MG/ML SOLN (MIXTURES ONLY)
Status: DC | PRN
Start: 2020-10-05 — End: 2020-10-05
  Administered 2020-10-05 (×3): 50 via INTRAVENOUS

## 2020-10-05 MED ORDER — DEXAMETHASONE SODIUM PHOSPHATE 10 MG/ML IJ SOLN
10 | INTRAMUSCULAR | Status: AC
Start: 2020-10-05 — End: 2020-10-05

## 2020-10-05 MED ORDER — CEFAZOLIN 2000 MG IN 20 ML SWFI IV SYRINGE (PREMIX)
Status: DC
Start: 2020-10-05 — End: 2020-10-05

## 2020-10-05 MED ORDER — CEFAZOLIN 2000 MG IN 20 ML SWFI IV SYRINGE (PREMIX)
Freq: Once | Status: AC
Start: 2020-10-05 — End: 2020-10-05
  Administered 2020-10-05: 20:00:00 via INTRAVENOUS

## 2020-10-05 MED ORDER — DEXTROSE 5 % IV SOLN
5 | INTRAVENOUS | Status: DC | PRN
Start: 2020-10-05 — End: 2020-10-06

## 2020-10-05 MED ORDER — LIDOCAINE HCL (PF) 2 % IJ SOLN
2 | INTRAMUSCULAR | Status: AC
Start: 2020-10-05 — End: 2020-10-05

## 2020-10-05 MED ORDER — BUPIVACAINE-EPINEPHRINE (PF) 0.5% -1:200000 IJ SOLN
INTRAMUSCULAR | Status: AC
Start: 2020-10-05 — End: 2020-10-05

## 2020-10-05 MED ORDER — PROPOFOL 200 MG/20ML IV EMUL
20020 MG/20ML | INTRAVENOUS | Status: DC | PRN
Start: 2020-10-05 — End: 2020-10-05
  Administered 2020-10-05: 20:00:00 50 via INTRAVENOUS
  Administered 2020-10-05: 20:00:00 200 via INTRAVENOUS
  Administered 2020-10-05: 21:00:00 50 via INTRAVENOUS

## 2020-10-05 MED ORDER — KETOROLAC TROMETHAMINE 30 MG/ML IJ SOLN
30 MG/ML | INTRAMUSCULAR | Status: AC
Start: 2020-10-05 — End: 2020-10-05
  Administered 2020-10-05: 23:00:00 via INTRAVENOUS

## 2020-10-05 MED FILL — ROCURONIUM BROMIDE 50 MG/5ML IV SOLN: 50 MG/5ML | INTRAVENOUS | Qty: 5

## 2020-10-05 MED FILL — DIPHENHYDRAMINE HCL 50 MG/ML IJ SOLN: 50 mg/mL | INTRAMUSCULAR | Qty: 1

## 2020-10-05 MED FILL — CEFAZOLIN 2000 MG IN 20 ML SWFI IV SYRINGE (PREMIX): Qty: 2000

## 2020-10-05 MED FILL — KETOROLAC TROMETHAMINE 30 MG/ML IJ SOLN: 30 mg/mL | INTRAMUSCULAR | Qty: 1

## 2020-10-05 MED FILL — LIDOCAINE HCL (PF) 1 % IJ SOLN: 1 % | INTRAMUSCULAR | Qty: 1

## 2020-10-05 MED FILL — BRIDION 200 MG/2ML IV SOLN: 200 MG/2ML | INTRAVENOUS | Qty: 2

## 2020-10-05 MED FILL — PROPOFOL 200 MG/20ML IV EMUL: 200 MG/20ML | INTRAVENOUS | Qty: 20

## 2020-10-05 MED FILL — FENTANYL CITRATE (PF) 100 MCG/2ML IJ SOLN: 100 MCG/2ML | INTRAMUSCULAR | Qty: 2

## 2020-10-05 MED FILL — OXYCODONE HCL 5 MG PO TABS: 5 mg | ORAL | Qty: 2

## 2020-10-05 MED FILL — TYLENOL EXTRA STRENGTH 500 MG PO TABS: 500 mg | ORAL | Qty: 2

## 2020-10-05 MED FILL — DEXAMETHASONE SODIUM PHOSPHATE 10 MG/ML IJ SOLN: 10 mg/mL | INTRAMUSCULAR | Qty: 1

## 2020-10-05 MED FILL — HYDROMORPHONE HCL 2 MG/ML IJ SOLN: 2 mg/mL | INTRAMUSCULAR | Qty: 1

## 2020-10-05 MED FILL — ONDANSETRON HCL 4 MG/2ML IJ SOLN: 4 MG/2ML | INTRAMUSCULAR | Qty: 4

## 2020-10-05 MED FILL — APREPITANT 40 MG PO CAPS: 40 mg | ORAL | Qty: 1

## 2020-10-05 MED FILL — XYLOCAINE-MPF 2 % IJ SOLN: 2 % | INTRAMUSCULAR | Qty: 5

## 2020-10-05 MED FILL — MIDAZOLAM HCL 2 MG/2ML IJ SOLN: 2 mg/mL | INTRAMUSCULAR | Qty: 2

## 2020-10-05 MED FILL — SENSORCAINE-MPF/EPINEPHRINE 0.5% -1:200000 IJ SOLN: INTRAMUSCULAR | Qty: 30

## 2020-10-05 NOTE — Other (Signed)
Discharge instructions reviewed with pt and family member [name]) who verbalize understanding of follow up care.

## 2020-10-05 NOTE — Anesthesia Post-Procedure Evaluation (Signed)
Department of Anesthesiology  Postprocedure Note    Patient: Robin Mendoza  MRN: [id number]  Birthdate: 05/23/2000  Date of evaluation: 10/05/2020  Time:  8:42 PM     Procedure Summary     Date: 10/05/20 Room / Location: [facility] MAIN OR 09 / [facility] MAIN OR    Anesthesia Start: 6269 Anesthesia Stop: 4854    Procedures:       CHOLECYSTECTOMY LAPAROSCOPIC (N/A Abdomen)      APPENDECTOMY LAPAROSCOPIC (N/A Abdomen) Diagnosis:       Abdominal pain, unspecified abdominal location      (Abdominal pain, unspecified abdominal location [R10.9])    Providers: Azalia Bilis, MD Responsible Provider: Verl Blalock, MD    Anesthesia Type: general ASA Status: 3          Anesthesia Type: No value filed.    Aldrete Phase I: Aldrete Score: 8    Aldrete Phase II:      Last vitals: Reviewed and per EMR flowsheets.       Anesthesia Post Evaluation    Patient location during evaluation: PACU  Patient participation: complete - patient participated  Level of consciousness: awake and alert and anxious  Pain score: 3  Airway patency: patent  Nausea & Vomiting: no nausea and no vomiting  Complications: no  Cardiovascular status: hemodynamically stable  Respiratory status: acceptable, nonlabored ventilation and spontaneous ventilation  Hydration status: euvolemic  Comments: BP 119/66    Pulse 96    Temp 97.6 ??F (36.4 ??C) (Temporal)    Resp 30    Ht 5\' 5"  (1.651 m)    Wt 110 lb (49.9 kg)    SpO2 92%    BMI 18.30 kg/m??     Patient complains of itching more than pain, likely from dilaudid per the RN and mom based on timing.  Treated with benadryl.  Ordered nalbuphine if continues to persist.  Patient is autistic and very uncomfortable, mom and dad at bedside believe she will do better if we can get her home.    Multimodal analgesia pain management approach

## 2020-10-05 NOTE — Other (Signed)
Patient sitting in wheelchair dressed. Parents are in agreement that patient is ready to discharge from PACU to go home. Patient still complains of pain near surgical incision, slightly tearful. VSS. SpO2 90-93% on room air. Respirations even, unlabored. Surgical sight with scant bloody drainage. Belongings with parents.

## 2020-10-05 NOTE — Op Note (Signed)
Operative Report    Patient: Robin Mendoza MRN: [id number]      Date of Birth: 06-29-2000  Age: 20 y.o.  Sex: female       Date of Surgery: 10/05/2020     Preoperative Diagnosis:   1.biliary dyskinesia  2.chronic RLQ pain    Postoperative Diagnosis: same    Procedure:    1.Laparoscopic Cholecystectomy -Single Incision/SILS  2. Laparoscopic appendectomy- SILS      Anesthesia: General   Complications: none  Estimated Blood Loss: per anesthesia    Indications:  As outlined in the History and Physical.  Single incision technique is planned to provide the patient with the benefit of less incisional pain and improved cosmesis due to fewer incisions as well as the potential for lower risk of wound infection.  This technique is significantly more intricate than standard laparoscopic techniques and typically increases the complexity of the procedure by 10-20%.    Procedure in Detail:   Informed consent was obtained and the patient was brought to the operating room and placed on the table in supine position with adequate padding of all pressure points and compression devices on both lower extremities. After the successful induction of general anesthesia, the patient???s abdomen was prepped and draped sterilely. Under direct laparoscopic visualization and additional local anesthetic, a 61mm optiview-type Trocar was inserted through a curved infraumbilical  incision and pneumoperitoneum was created. Visual exploration revealed no immediately apparent pathology. An additional 41mm Trocar was placed through the incision  and a Landa grasper was inserted alongside that trocar sleeve. The visibly normal gallbladder was grasped by its neck and retracted anterolaterally. The tissues investing this area were incised and blunt dissection was used to skeletonize the normal caliber cystic duct for approximately 43mm. A generous window was created behind the duct allowing identification of the cystic artery, which was traced to the  gallbladder wall and divided with the harmonic scalpel. This permitted  generation of a large window behind the neck of the gallbladder allowing a near 360 degree view of the region to confirm normal anatomy of the origin of the cystic duct. A clip was then placed across the neck of the gallbladder and two on the duct 0.5 cm medially and it was transected. The gallbladder was excised with the harmonic and hemostasis of the liver bed confirmed.    Attention was turned to the pelvis.  The small bowel was pulled out of the pelvis exposing unremarkable pelvic structures.  A long, nominally hyperemic/hypervascular appendix was seen.  The mesoappendix was divided near the serosa of the appendix with harmonic shears.  The appendix was then divided at its base with an endo-GIA, blue load.  The staple line and transected mesentery were confirmed hemostatic.  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    General survey of the upper abdomen revealed no visible pathology.  Surgical sites were confirmed hemostatic.  The specimens were placed in a pouch which was extracted through the umbilical site. Pneumoperitoneum was released and the trocars were removed. The fascia was closed with a figure-of-eight 0-Vicryl,  then the incision was  made hemostatic with cautery and closed with subcuticular 4-0 Vicryl and Steri-Strips were applied.     The patient tolerated the procedure well. There were no immediate apparent complications. She was awakened from anesthesia and extubated in the operating room, taken to recovery in satisfactory condition.  Specimens:   ID Type Source Tests Collected by Time Destination   A : gallbladder Tissue Gallbladder SURGICAL PATHOLOGY Azalia Bilis, MD 10/05/2020 1623    B :  Tissue Appendix SURGICAL PATHOLOGY Azalia Bilis, MD 10/05/2020 1627            Counts: Sponge and needle counts were correct.    Signed By:   Azalia Bilis, MD     Oct 05, 2020

## 2020-10-05 NOTE — Discharge Instructions (Signed)
Azalia Bilis, M.D.  [phone]    Instructions following Laparoscopic Cholecystectomy:    ACTIVITY:  . Try to take a few short walks with help around the house later today. It is very important to take short walks to avoid blood clots and pneumonia.  . You may be light-headed or sleepy from anesthesia, so be careful going up and down stairs.  . Avoid any activity that may be dangerous or involves heavy objects for 24-48 hours.    DIET:  . Drink only clear, non-carbonated liquids when you get home (sugar-free if you are diabetic), such as Gatorade, chicken broth, etc.  . Tomorrow start soft foods such as grits and eggs, mashed potatoes, yogurt, cottage cheese, etc. Remain on soft foods for at least 24-36 hours then you may eat whatever foods you wish.  . Many people experience nausea for a day or so after surgery, and many people have loose stools or diarrhea immediately after gallbladder surgery. It is also not uncommon to not have a bowel movement for 2-3 days.  Using Miralax or other over the counter laxative is fine.    PAIN:  . You will be given a prescription for pain medication and nausea.  . Try to take the pain medication with food, even a few crackers.  . You may also use Tylenol, Motrin, Advil, or Aleve instead of the prescription pain medication. Do no take Tylenol and the prescription pain medication within 4 hours of each other.  Marland Kitchen URINARY RETENTION: If you are unable to empty your bladder within 6 hours after returning home, please go to your nearest Emergency Department or Urgent Care for urinary catheterization.    WOUND CARE:  . You may shower the day after surgery  . It is not uncommon for the incisions to ooze or drain blood-tinged fluid. Cover the area with gauze if the drainage continues.  . Incisions will sometimes develop redness around them, up to the size of a quarter, as well as a hard "lumpy" feel. If this redness continues to get larger, please call the office.   Marland Kitchen PLEASE NOTE:  it is not uncommon to have pain and a "knot" to the left of the belly button.    CALL THE DOCTOR IF:  . You have a temperature higher than 101.5 Fahrenheit for more than 6 hours.  . You have severe nausea or vomiting not relieved by medication; or diarrhea.  . You develop increasing redness or infection at the incision.   Continue home medications as previously prescribed.   After general anesthesia or intravenous sedation, for 24 hours or while taking prescription Narcotics:   Limit your activities   A responsible adult needs to be with you for the next 24 hours   Do not drive and operate hazardous machinery   Do not make important personal or business decisions   Do not drink alcoholic beverages   If you have not urinated within 8 hours after discharge, and you are experiencing discomfort from urinary retention, please go to the nearest ED.   If you have sleep apnea and have a CPAP machine, please use it for all naps and sleeping.   Please use caution when taking narcotics and any of your home medications that may cause drowsiness.  *  Please give a list of your current medications to your Primary Care Provider.  *  Please update this list whenever your medications are discontinued, doses are      changed, or new  medications (including over-the-counter products) are added.  *  Please carry medication information at all times in case of emergency situations.    These are general instructions for a healthy lifestyle:  No smoking/ No tobacco products/ Avoid exposure to second hand smoke  Surgeon General's Warning:  Quitting smoking now greatly reduces serious risk to your health.  Obesity, smoking, and sedentary lifestyle greatly increases your risk for illness  A healthy diet, regular physical exercise & weight monitoring are important for maintaining a healthy lifestyle    You may be retaining fluid if you have a history of heart failure or if you experience any of the following symptoms:  Weight gain of 3  pounds or more overnight or 5 pounds in a week, increased swelling in our hands or feet or shortness of breath while lying flat in bed.  Please call your doctor as soon as you notice any of these symptoms; do not wait until your next office visit.

## 2020-10-05 NOTE — Interval H&P Note (Signed)
Update History & Physical    The patient's History and Physical was reviewed with the patient and I examined the patient. There was no change. The surgical site was confirmed by the patient and me.       Plan: The risks, benefits, expected outcome, and alternative to the recommended procedure have been discussed with the patient. Patient understands and wants to proceed with the procedure.     Azalia Bilis, MD  10/05/2020

## 2020-10-05 NOTE — Other (Addendum)
Dr. Vicenta Dunning notified of pt c/o generalized itchiness. Verbal order for Benadryl IV 25 mg STAT.       1933: Dr. Vicenta Dunning notified of pt c/o of continued itchiness post initial Benadryl administration. Verbal order for Benadryl 50 mg IV STAT.

## 2020-10-05 NOTE — Anesthesia Pre-Procedure Evaluation (Addendum)
Department of Anesthesiology  Preprocedure Note       Name:  Robin Mendoza   Age:  20 y.o.  DOB:  June 07, 2000                                          MRN:  [id number]         Date:  10/05/2020      Surgeon: Juliann Mule):  Azalia Bilis, MD    Procedure: Procedure(s):  CHOLECYSTECTOMY LAPAROSCOPIC  APPENDECTOMY LAPAROSCOPIC    Medications prior to admission:   Prior to Admission medications    Medication Sig Start Date End Date Taking? Authorizing Provider   Sertraline HCl (ZOLOFT PO) Take 75 mg by mouth daily   Yes Historical Provider, MD   Famotidine (PEPCID PO) Take 10 mg by mouth daily   Yes Historical Provider, MD   levonorgestrel (MIRENA, 52 MG,) IUD 52 mg 1 each by IntraUTERine route once   Yes Historical Provider, MD   OMALIZUMAB 150 MG injection 150 mg every 28 days  08/01/20   Historical Provider, MD   VYVANSE 60 MG CHEW  09/12/20   Historical Provider, MD   guanFACINE (INTUNIV) 1 MG TB24 extended release tablet Take 1 mg by mouth daily  07/11/20   Historical Provider, MD   ondansetron (ZOFRAN-ODT) 4 MG disintegrating tablet Take 4 mg by mouth every 8 hours as needed 10/22/19   Ar Automatic Reconciliation       Current medications:    Current Facility-Administered Medications   Medication Dose Route Frequency Provider Last Rate Last Admin   ??? [START ON 10/06/2020] ceFAZolin (ANCEF) 2000 mg in sterile water 20 mL IV syringe  2,000 mg IntraVENous On Call Azalia Bilis, MD       ??? lactated ringers infusion   IntraVENous Continuous Azalia Bilis, MD       ??? lidocaine PF 1 % injection 1 mL  1 mL IntraDERmal Once PRN Verl Blalock, MD       ??? acetaminophen (TYLENOL) tablet 1,000 mg  1,000 mg Oral Once Verl Blalock, MD       ??? HYDROmorphone HCl PF (DILAUDID) injection 0.25 mg  0.25 mg IntraVENous Once PRN Verl Blalock, MD        Or   ??? HYDROmorphone HCl PF (DILAUDID) injection 0.5 mg  0.5 mg IntraVENous Once PRN Verl Blalock, MD       ??? midazolam PF (VERSED) injection 2 mg  2 mg IntraVENous Once  PRN Verl Blalock, MD           Allergies:    Allergies   Allergen Reactions   ??? Lac Bovis Anaphylaxis   ??? Dog Epithelium Allergy Skin Test Itching       Problem List:  There is no problem list on file for this patient.      Past Medical History:        Diagnosis Date   ??? ADHD     managed with meds   ??? Autism    ??? Depression    ??? GERD (gastroesophageal reflux disease)        Past Surgical History:        Procedure Laterality Date   ??? COLONOSCOPY N/A 09/17/2020    COLONOSCOPY/BMI 21 performed by Flint Melter, MD at [facility] ENDOSCOPY   ??? COLONOSCOPY N/A 01/14/2019    COLONOSCOPY/  BMI  21 PT IS AUTISTIC performed by Flint Melter, MD at [facility] ENDOSCOPY   ??? EAR SURGERY Bilateral     as a baby   ??? UPPER GASTROINTESTINAL ENDOSCOPY         Social History:    Social History     Tobacco Use   ??? Smoking status: Never Smoker   ??? Smokeless tobacco: Never Used   Substance Use Topics   ??? Alcohol use: Not Currently                                Counseling given: Not Answered      Vital Signs (Current):   Vitals:    10/03/20 0930 10/05/20 1401   BP:  114/68   Pulse:  (!) 102   Resp:  15   Temp:  98.4 ??F (36.9 ??C)   TempSrc:  Oral   SpO2:  100%   Weight: 110 lb (49.9 kg) 110 lb (49.9 kg)   Height: 5\' 5"  (1.651 m) 5\' 5"  (1.651 m)                                              BP Readings from Last 3 Encounters:   10/05/20 114/68   10/02/20 104/69   03/26/16 118/76 (81 %, Z = 0.88 /  89 %, Z = 1.23)*     *BP percentiles are based on the 2017 AAP Clinical Practice Guideline for girls       NPO Status:                                                                                 BMI:   Wt Readings from Last 3 Encounters:   10/05/20 110 lb (49.9 kg)   10/02/20 112 lb (50.8 kg)   11/08/19 125 lb (56.7 kg)     Body mass index is 18.3 kg/m??.    CBC:   Lab Results   Component Value Date    WBC 5.7 09/17/2020    RBC 4.20 09/17/2020    HGB 11.9 09/17/2020    HCT 36.0 09/17/2020    MCV 85.7 09/17/2020    RDW 13.3 09/17/2020    PLT  211 09/17/2020       CMP:   Lab Results   Component Value Date    NA 140 09/17/2020    K 3.5 09/17/2020    CL 107 09/17/2020    CO2 29 09/17/2020    BUN 6 09/17/2020    CREATININE 0.80 09/17/2020    GFRAA >60 09/17/2020    AGRATIO 1.1 09/17/2020    GLUCOSE 101 09/17/2020    PROT 5.9 09/17/2020    CALCIUM 8.5 09/17/2020    BILITOT 0.3 09/17/2020    ALKPHOS 82 09/17/2020    AST 17 09/17/2020    ALT 23 09/17/2020       POC Tests: No results for input(s): POCGLU, POCNA, POCK, POCCL, POCBUN, POCHEMO, POCHCT in the last 72 hours.    Coags: No results  found for: PROTIME, INR, APTT    HCG (If Applicable): No results found for: PREGTESTUR, PREGSERUM, HCG, HCGQUANT     ABGs: No results found for: PHART, PO2ART, PCO2ART, HCO3ART, BEART, O2SATART     Type & Screen (If Applicable):  No results found for: LABABO, LABRH    Drug/Infectious Status (If Applicable):  No results found for: HIV, HEPCAB    COVID-19 Screening (If Applicable):   Lab Results   Component Value Date    COVID19 Not Detected 01/07/2019           Anesthesia Evaluation  Patient summary reviewed and Nursing notes reviewed   history of anesthetic complications: PONV.  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   Endo/Other:                     Abdominal:             Vascular:          Other Findings:           Anesthesia Plan      general     ASA 3     (Emend in preop - high risk for PONV. Mom will be coming back with [place].  I initially told the parents and [name] that she  could push the lidocaine and the propofol, as I did not see any safety issues for this accomodation with induction given her cardiovascular and pulmonary health, normal weight.  The mother and [place] both stated she had done this before in our health system, which I believed given the details of which both recalled about her last anesthetics.  Additionally, I did not sense or perceive any malingering or intoxication seeking behavior.  [name] was very interested in what was going to happen to her body with intubation, drugs and their purpose, and anesthesia.  I sensed allowing her to push the 2 medications lidocaine and propofol would be more beneficial for her experience and post surgical life regarding this experience.     After informing the CRNA about the plan, there was a lot of concerns since this is almost never seen in adult practice.  I agreed since their was significant concerns, that I would not allow her to push any medications.  Multiple other CRNA's voiced the same concerns.  I spoke with the parents and [name] about this - this was devastating to [name] and her parents.  I apologized profusely to them.   )  Induction: intravenous.    MIPS: Prophylactic antiemetics administered.  Anesthetic plan and risks discussed with patient, father and mother.  Plan discussed with CRNA.    Attending anesthesiologist reviewed and agrees with Preprocedure content                Verl Blalock, MD   10/05/2020

## 2020-10-05 NOTE — Other (Addendum)
Report and transfer of care to me from Pearletha Alfred., RN.

## 2020-10-05 NOTE — Anesthesia Procedure Notes (Signed)
Airway  Date/Time: 10/05/2020 3:49 PM  Urgency: elective    Airway not difficult    General Information and Staff    Patient location during procedure: OR  Resident/CRNA: Hilarie Fredrickson, APRN - CRNA  Performed: resident/CRNA     Indications and Patient Condition  Indications for airway management: anesthesia and airway protection  Spontaneous ventilation: present  Preoxygenated: no  Patient position: sniffing  MILS maintained throughout  Mask difficulty assessment: vent by bag mask    Final Airway Details  Final airway type: endotracheal airway      Successful airway: ETT  Cuffed: yes   Successful intubation technique: direct laryngoscopy  Endotracheal tube insertion site: oral  Blade: Macintosh  Blade size: #3  ETT size (mm): 7.0  Cormack-Lehane Classification: grade I - full view of glottis  Placement verified by: chest auscultation and capnometry   Measured from: lips  ETT to lips (cm): 22  Number of attempts at approach: 1  Ventilation between attempts: bag mask    no

## 2020-10-06 MED ORDER — MIDAZOLAM HCL (PF) 2 MG/2ML IJ SOLN
2 MG/ML | INTRAMUSCULAR | Status: DC | PRN
Start: 2020-10-06 — End: 2020-10-06

## 2020-10-06 MED ORDER — NALBUPHINE HCL 10 MG/ML IJ SOLN
10 MG/ML | INTRAMUSCULAR | Status: DC
Start: 2020-10-06 — End: 2020-10-06

## 2020-10-06 MED FILL — NALBUPHINE HCL 10 MG/ML IJ SOLN: 10 mg/mL | INTRAMUSCULAR | Qty: 1

## 2020-10-10 ENCOUNTER — Encounter

## 2020-10-10 ENCOUNTER — Encounter: Admit: 2020-10-10 | Discharge: 2020-10-10 | Payer: BLUE CROSS/BLUE SHIELD

## 2020-10-10 MED FILL — SERTRALINE HCL 50MG TABS: 50 mg | 90 days supply | Qty: 135 | Fill #1 | Status: AC

## 2020-10-10 MED FILL — GUANFACINE HCL ER 1MG TB24: 1 mg | 90 days supply | Qty: 90 | Fill #1 | Status: AC

## 2020-10-10 MED FILL — GUANFACINE HCL ER 4MG TB24: 4 mg | 90 days supply | Qty: 90 | Fill #0 | Status: AC

## 2020-10-10 MED FILL — LANSOPRAZOLE 30MG CPDR: 30 mg | 90 days supply | Qty: 180 | Fill #1 | Status: AC

## 2020-10-11 LAB — CBC WITH AUTO DIFFERENTIAL
Absolute Eos #: 0.2 10*3/uL (ref 0.0–0.8)
Absolute Immature Granulocyte: 0 10*3/uL (ref 0.0–0.5)
Absolute Lymph #: 2 10*3/uL (ref 0.5–4.6)
Absolute Mono #: 0.8 10*3/uL (ref 0.1–1.3)
Basophils Absolute: 0 10*3/uL (ref 0.0–0.2)
Basophils: 0 % (ref 0.0–2.0)
Eosinophils %: 2 % (ref 0.5–7.8)
Hematocrit: 41.9 % (ref 35.8–46.3)
Hemoglobin: 13.2 g/dL (ref 11.7–15.4)
Immature Granulocytes: 0 % (ref 0.0–5.0)
Lymphocytes: 22 % (ref 13–44)
MCH: 28.8 PG (ref 26.1–32.9)
MCHC: 31.5 g/dL (ref 31.4–35.0)
MCV: 91.5 FL (ref 79.6–97.8)
MPV: 11.1 FL (ref 9.4–12.3)
Monocytes: 8 % (ref 4.0–12.0)
Platelets: 313 10*3/uL (ref 150–450)
RBC: 4.58 M/uL (ref 4.05–5.2)
RDW: 13.6 % (ref 11.9–14.6)
Seg Neutrophils: 68 % (ref 43–78)
Segs Absolute: 6 10*3/uL (ref 1.7–8.2)
WBC: 9.1 10*3/uL (ref 4.3–11.1)
nRBC: 0 10*3/uL (ref 0.0–0.2)

## 2020-10-11 LAB — LIPASE: Lipase: 204 U/L (ref 73–393)

## 2020-10-12 ENCOUNTER — Encounter: Admit: 2020-10-12 | Discharge: 2020-10-12 | Payer: BLUE CROSS/BLUE SHIELD

## 2020-10-12 DIAGNOSIS — R197 Diarrhea, unspecified: Secondary | ICD-10-CM

## 2020-10-12 NOTE — Progress Notes (Signed)
Pt presents for labs. See orders.

## 2020-10-12 NOTE — Addendum Note (Signed)
Addended by: Trish Fountain A on: 10/12/2020 12:06 PM     Modules accepted: Orders

## 2020-10-12 NOTE — Addendum Note (Signed)
Addended by: Trish Fountain A on: 10/12/2020 12:05 PM     Modules accepted: Orders

## 2020-10-12 NOTE — Addendum Note (Signed)
Addended by: Otto Herb on: 10/12/2020 12:06 PM     Modules accepted: Orders

## 2020-10-13 LAB — LIPASE: Lipase: 96 U/L (ref 73–393)

## 2020-10-13 LAB — CULTURE, URINE: Culture: <1000

## 2020-10-13 LAB — VITAMIN B12: Vitamin B-12: 404 pg/mL (ref 193–986)

## 2020-10-13 LAB — LIPID PANEL
Chol/HDL Ratio: 4.3
Cholesterol, Total: 121 MG/DL (ref <200)
HDL: 28 MG/DL — ABNORMAL LOW (ref 40–60)
LDL Calculated: 49.6 MG/DL (ref <100)
Triglycerides: 217 MG/DL — ABNORMAL HIGH (ref 35–150)
VLDL Cholesterol Calculated: 43.4 MG/DL — ABNORMAL HIGH (ref 6.0–23.0)

## 2020-10-13 LAB — T4, FREE: T4 Free: 0.8 NG/DL (ref 0.78–1.46)

## 2020-10-13 LAB — TSH WITH REFLEX: TSH w Free Thyroid if Abnormal: 1.3 u[IU]/mL (ref 0.358–3.740)

## 2020-10-13 LAB — AMYLASE: Amylase: 57 U/L (ref 25–115)

## 2020-10-13 LAB — VITAMIN D 25 HYDROXY: Vit D, 25-Hydroxy: 16 ng/mL — ABNORMAL LOW (ref 30.0–100.0)

## 2020-10-16 ENCOUNTER — Ambulatory Visit: Admit: 2020-10-16 | Discharge: 2020-10-16 | Payer: BLUE CROSS/BLUE SHIELD | Attending: Surgery

## 2020-10-16 DIAGNOSIS — R1011 Right upper quadrant pain: Secondary | ICD-10-CM

## 2020-10-16 LAB — IGG 4: IgG 4: 44 mg/dL (ref 2–96)

## 2020-10-16 LAB — C-PEPTIDE: C-Peptide: 4.2 ng/mL (ref 1.1–4.4)

## 2020-10-16 LAB — ANA, DIRECT, W/REFLEX: ANA: NEGATIVE

## 2020-10-17 LAB — CELIAC DISEASE PANEL
Gliadin Antibodies IgA: 4 units (ref 0–19)
Gliadin Antibodies IgG: 1 units (ref 0–19)
IgA: 335 mg/dL (ref 87–352)
TTG, IgG: <2 U/mL (ref 0–5)
Tissue Transglutaminase IgA: <2 U/mL (ref 0–3)

## 2020-10-17 NOTE — Progress Notes (Signed)
S/p lap choly/appy.  In very good spirits  Still having pain as was present preop  Has seen GI (switched to [facility]) and having additional testing- has start panc enz replacement  No abd distention, fever, chills    Incision- healing well  Pt with significant anxiety-induced voluntary guarding; appears to have mild generalized central abdominal tenderness    Path- chronic cholecystitis, nl appendix    1. RUQ pain    2. Chronic RLQ pain    3. Abnormal biliary HIDA scan       Doing well after surgery; disappointed no seeing benefit to date.  Continue followup with GI  Would advise against international foreign travel for a few weeks, at least.    Follow-up and Dispositions    ?? Return for as needed.

## 2020-10-18 ENCOUNTER — Encounter

## 2020-10-18 LAB — PROTIME-INR
INR: 1.1
Protime: 14.8 s — ABNORMAL HIGH (ref 12.6–14.5)

## 2020-10-19 LAB — VITAMIN A: VITAMIN A: 41.4 ug/dL (ref 18.9–57.3)

## 2020-10-19 LAB — LIPASE: Lipase: 92 U/L (ref 73–393)

## 2020-10-19 LAB — C-REACTIVE PROTEIN: CRP: <0.3 mg/dL (ref 0.0–0.9)

## 2020-10-20 DIAGNOSIS — G8929 Other chronic pain: Secondary | ICD-10-CM

## 2020-10-20 NOTE — ED Triage Notes (Signed)
Pt arrived via POV c/o right sided abd pain X3 days. Pt has been seen by GI for numerous abd issues. denies NVD

## 2020-10-20 NOTE — Discharge Instructions (Signed)
Continue to work with the gastroenterologist and take the patient's medications as prescribed.  If symptoms return or worsen please return to the emergency department at that time.

## 2020-10-20 NOTE — ED Notes (Signed)
I have reviewed discharge instructions with the patient.  The patient verbalized understanding.    Patient left ED via Discharge Method: wheelchair to Home with (mom).    Opportunity for questions and clarification provided.       Patient given 0 scripts.         To continue your aftercare when you leave the hospital, you may receive an automated call from our care team to check in on how you are doing.  This is a free service and part of our promise to provide the best care and service to meet your aftercare needs.??? If you have questions, or wish to unsubscribe from this service please call [phone].  Thank you for Choosing our [facility] Emergency Department.        Hazeline Junker, RN  10/20/20 [phone]

## 2020-10-20 NOTE — ED Provider Notes (Signed)
[facility] Emergency Department Provider Note                   PCP:                PROVIDER Verita Schneiders, MD               Age: 20 y.o.      Sex: female       ICD-10-CM    1. Abdominal pain, right upper quadrant  R10.11        DISPOSITION         New Prescriptions    No medications on file       Orders Placed This Encounter   Procedures   ??? CBC with Diff   ??? CMP   ??? Lipase   ??? Protime-INR   ??? Diet NPO   ??? POCT Urine Dipstick   ??? Saline lock IV        Keene Breath, DO 10:52 PM      MDM  Number of Diagnoses or Management Options  Abdominal pain, right upper quadrant  Diagnosis management comments: Viral infection, gastroenteritis, viral adenitis, pseudomembranous colitis, inflammatory  bowel disease, infectious diarrhea    Abdominal wall pain,     Constipation, fecal impaction, small bowel obstruction, partial small bowel obstruction,  Ileus    gallbladder disease, cholecystitis, diverticulitis, appendicitis, appendicitis with rupture,    UTI, pyelonephritis, renal colic, ureteral stone     Peptic ulcer disease, esophagitis, GERD    Pancreatitis, pancreatic pseudocyst,    hepatic cirrhosis, GI bleed, esophageal varices, poisoning,    ingestion of foreign material       Amount and/or Complexity of Data Reviewed  Clinical lab tests: ordered and reviewed  Tests in the medicine section of CPT??: reviewed and ordered  Review and summarize past medical records: yes  Independent visualization of images, tracings, or specimens: yes         Robin Mendoza is a 21 y.o. female who presents to the Emergency Department with chief complaint of    Chief Complaint   Patient presents with   ??? Abdominal Pain      HPI    All other systems reviewed and are negative.    Review of Systems   Gastrointestinal: Positive for abdominal pain and nausea. Negative for vomiting.   All other systems reviewed and are negative.      Past Medical History:   Diagnosis Date   ??? ADHD     managed with meds   ??? Autism    ??? Depression    ??? GERD  (gastroesophageal reflux disease)    ??? Weight loss         Past Surgical History:   Procedure Laterality Date   ??? CHOLECYSTECTOMY, LAPAROSCOPIC N/A 10/05/2020    CHOLECYSTECTOMY LAPAROSCOPIC performed by Azalia Bilis, MD at [facility] MAIN OR   ??? COLONOSCOPY N/A 09/17/2020    COLONOSCOPY/BMI 21 performed by Flint Melter, MD at [facility] ENDOSCOPY   ??? COLONOSCOPY N/A 01/14/2019    COLONOSCOPY/  BMI 21 PT IS AUTISTIC performed by Flint Melter, MD at [place]   ??? EAR SURGERY Bilateral     as a baby   ??? LAPAROSCOPIC APPENDECTOMY N/A 10/05/2020    APPENDECTOMY LAPAROSCOPIC performed by Azalia Bilis, MD at [facility] MAIN OR   ??? UPPER GASTROINTESTINAL ENDOSCOPY          Family History   Problem Relation Age of Onset   ???  Cancer Mother         bilateral breast cancer   ??? No Known Problems Father    ??? Hypertension Mother            Social Connections:    ??? Frequency of Communication with Friends and Family: Not on file   ??? Frequency of Social Gatherings with Friends and Family: Not on file   ??? Attends Religious Services: Not on file   ??? Active Member of Clubs or Organizations: Not on file   ??? Attends Archivist Meetings: Not on file   ??? Marital Status: Not on file        Allergies   Allergen Reactions   ??? Lac Bovis Anaphylaxis   ??? Dog Epithelium Allergy Skin Test Itching        Vitals signs and nursing note reviewed.   Patient Vitals for the past 4 hrs:   Temp Pulse Resp BP SpO2   10/20/20 2022 98 ??F (36.7 ??C) (!) 102 16 135/76 98 %          Physical Exam     GENERAL:The patient has Body mass index is 17.97 kg/m??. Well-hydrated.    VITAL SIGNS: Heart rate, blood pressure, respiratory rate reviewed as recorded in  nurse's notes  EYES: Pupils reactive. Extraocular motion intact. No conjunctival redness or drainage.  LUNGS: Breath sounds clear and equal bilaterally no accessory muscle use.  CHEST: No deformity  CARDIOVASCULAR: Regular rate and rhythm  ABDOMEN: Soft with right upper quadrant tenderness. No  palpable masses or organomegaly. No  peritoneal signs. No rigidity.  Negative Delman Cheadle and Rovsing signs.  No flank tenderness or CVA tenderness noted.  EXTREMITIES: No clubbing or cyanosis. No joint swelling. Normal muscle tone. No  restricted range of motion appreciated.  NEUROLOGIC: Sensation is grossly intact. Cranial nerve exam reveals face is  symmetrical, tongue is midline speech is clear.  SKIN: No rash or petechiae. Good skin turgor palpated.  PSYCHIATRIC: Alert and oriented. Appropriate behavior and judgment.      Procedures        Labs Reviewed   CBC WITH AUTO DIFFERENTIAL - Abnormal; Notable for the following components:       Result Value    RBC 3.98 (*)     Hematocrit 35.0 (*)     Seg Neutrophils 42 (*)     All other components within normal limits   COMPREHENSIVE METABOLIC PANEL - Abnormal; Notable for the following components:    Chloride 108 (*)     Anion Gap 6 (*)     Glucose 101 (*)     Total Bilirubin 0.1 (*)     Albumin 3.4 (*)     Albumin/Globulin Ratio 1.0 (*)     All other components within normal limits   LIPASE   PROTIME-INR        No orders to display                      ED Course as of 10/20/20 2252   Sat Oct 20, 2020   2205 WBC: 6.6 [KH]   2217 When I back in the room patient is much more tearful and now crying complaining of increasing pain..  Patient states that it did not seem to help with the combination of pain medications that I gave her earlier.  Her mom tells me that typically if we can get her to settle down and get some rest the pain will  also improve. [KH]   2218 Mom who is an OB/GYN has been working closely with GI and says that the symptoms she is experiencing tonight are not unusual for her the only unusual [KH]   2250 Patient doing much better rating her pain at a 4 out of 10 now.  Patient will be given 1 additional dose of pain medication prior to the time of discharge.  Her family will continue to monitor her closely at home. [KH]      ED Course User Index  [KH]  Keene Breath, DO        Voice dictation software was used during the making of this note.  This software is not perfect and grammatical and other typographical errors may be present.  This note has not been completely proofread for errors.       Keene Breath, DO  10/20/20 2252

## 2020-10-21 ENCOUNTER — Inpatient Hospital Stay
Admit: 2020-10-21 | Discharge: 2020-10-21 | Disposition: A | Discharge status: Home / Self Care | Payer: BLUE CROSS/BLUE SHIELD | Attending: Emergency Medicine

## 2020-10-21 LAB — CBC WITH AUTO DIFFERENTIAL
Absolute Eos #: 0.1 10*3/uL (ref 0.0–0.8)
Absolute Immature Granulocyte: 0 10*3/uL (ref 0.0–0.5)
Absolute Lymph #: 2.9 10*3/uL (ref 0.5–4.6)
Absolute Mono #: 0.7 10*3/uL (ref 0.1–1.3)
Basophils Absolute: 0.1 10*3/uL (ref 0.0–0.2)
Basophils: 1 % (ref 0.0–2.0)
Eosinophils %: 2 % (ref 0.5–7.8)
Hematocrit: 35 % — ABNORMAL LOW (ref 35.8–46.3)
Hemoglobin: 11.7 g/dL (ref 11.7–15.4)
Immature Granulocytes: 0 % (ref 0.0–5.0)
Lymphocytes: 44 % (ref 13–44)
MCH: 29.4 PG (ref 26.1–32.9)
MCHC: 33.4 g/dL (ref 31.4–35.0)
MCV: 87.9 FL (ref 79.6–97.8)
MPV: 10 FL (ref 9.4–12.3)
Monocytes: 11 % (ref 4.0–12.0)
Platelets: 289 10*3/uL (ref 150–450)
RBC: 3.98 M/uL — ABNORMAL LOW (ref 4.05–5.2)
RDW: 14.1 % (ref 11.9–14.6)
Seg Neutrophils: 42 % — ABNORMAL LOW (ref 43–78)
Segs Absolute: 2.8 10*3/uL (ref 1.7–8.2)
WBC: 6.6 10*3/uL (ref 4.3–11.1)
nRBC: 0 10*3/uL (ref 0.0–0.2)

## 2020-10-21 LAB — COMPREHENSIVE METABOLIC PANEL
ALT: 18 U/L (ref 12–65)
AST: 16 U/L (ref 15–37)
Albumin/Globulin Ratio: 1 — ABNORMAL LOW (ref 1.2–3.5)
Albumin: 3.4 g/dL — ABNORMAL LOW (ref 3.5–5.0)
Alk Phosphatase: 81 U/L (ref 50–136)
Anion Gap: 6 mmol/L — ABNORMAL LOW (ref 7–16)
BUN: 8 MG/DL (ref 6–23)
CO2: 28 mmol/L (ref 21–32)
Calcium: 9.2 MG/DL (ref 8.3–10.4)
Chloride: 108 mmol/L — ABNORMAL HIGH (ref 98–107)
Creatinine: 0.8 MG/DL (ref 0.6–1.0)
GFR African American: >60 mL/min/{1.73_m2} (ref >60)
GFR Non-African American: >60 mL/min/{1.73_m2} (ref >60)
Globulin: 3.3 g/dL (ref 2.3–3.5)
Glucose: 101 mg/dL — ABNORMAL HIGH (ref 65–100)
Potassium: 3.7 mmol/L (ref 3.5–5.1)
Sodium: 142 mmol/L (ref 136–145)
Total Bilirubin: 0.1 MG/DL — ABNORMAL LOW (ref 0.2–1.1)
Total Protein: 6.7 g/dL (ref 6.3–8.2)

## 2020-10-21 LAB — PROTIME-INR
INR: 1.1
Protime: 14.5 s (ref 12.6–14.5)

## 2020-10-21 LAB — IGE: IgE: 326 IU/mL (ref 6–495)

## 2020-10-21 LAB — LIPASE: Lipase: 180 U/L (ref 73–393)

## 2020-10-21 MED ORDER — NORMAL SALINE FLUSH 0.9 % IV SOLN
0.9 | Freq: Three times a day (TID) | INTRAVENOUS | Status: DC
Start: 2020-10-21 — End: 2020-10-21

## 2020-10-21 MED ORDER — SODIUM CHLORIDE 0.9 % IV BOLUS
0.9 % | INTRAVENOUS | Status: AC
Start: 2020-10-21 — End: 2020-10-20
  Administered 2020-10-21: 01:00:00 500 mL via INTRAVENOUS

## 2020-10-21 MED ORDER — KETOROLAC TROMETHAMINE 15 MG/ML IJ SOLN
15 MG/ML | INTRAMUSCULAR | Status: AC
Start: 2020-10-21 — End: 2020-10-20
  Administered 2020-10-21: 02:00:00 15 mg via INTRAVENOUS

## 2020-10-21 MED ORDER — LORAZEPAM 2 MG/ML IJ SOLN
2 MG/ML | Freq: Once | INTRAMUSCULAR | Status: AC
Start: 2020-10-21 — End: 2020-10-20
  Administered 2020-10-21: 02:00:00 2 mg via INTRAVENOUS

## 2020-10-21 MED ORDER — ONDANSETRON HCL 4 MG/2ML IJ SOLN
4 MG/2ML | INTRAMUSCULAR | Status: AC
Start: 2020-10-21 — End: 2020-10-20
  Administered 2020-10-21: 01:00:00 4 mg via INTRAVENOUS

## 2020-10-21 MED ORDER — DIPHENHYDRAMINE HCL 25 MG PO CAPS
25 MG | ORAL | Status: AC
Start: 2020-10-21 — End: 2020-10-20
  Administered 2020-10-21: 03:00:00 25 mg via ORAL

## 2020-10-21 MED ORDER — ALUM & MAG HYDROXIDE-SIMETH 200-200-20 MG/5ML PO SUSP
200-200-20 MG/5ML | ORAL | Status: AC
Start: 2020-10-21 — End: 2020-10-20
  Administered 2020-10-21: 02:00:00 30 mL via ORAL

## 2020-10-21 MED ORDER — LIDOCAINE VISCOUS HCL 2 % MT SOLN
2 % | OROMUCOSAL | Status: AC
Start: 2020-10-21 — End: 2020-10-20
  Administered 2020-10-21: 02:00:00 15 mL via OROMUCOSAL

## 2020-10-21 MED ORDER — MORPHINE SULFATE 4 MG/ML IV SOLN
4 MG/ML | Freq: Once | INTRAVENOUS | Status: AC
Start: 2020-10-21 — End: 2020-10-20
  Administered 2020-10-21: 01:00:00 4 mg via INTRAVENOUS

## 2020-10-21 MED ORDER — LORAZEPAM 2 MG/ML IJ SOLN
2 MG/ML | Freq: Once | INTRAMUSCULAR | Status: DC
Start: 2020-10-21 — End: 2020-10-20

## 2020-10-21 MED ORDER — FENTANYL 0.05 MG/ML SOLN (MIXTURES ONLY)
Status: AC
Start: 2020-10-21 — End: 2020-10-20
  Administered 2020-10-21: 03:00:00 50 ug via INTRAVENOUS

## 2020-10-21 MED FILL — DIPHENHYDRAMINE HCL 25 MG PO CAPS: 25 mg | ORAL | Qty: 1

## 2020-10-21 MED FILL — FENTANYL CITRATE (PF) 100 MCG/2ML IJ SOLN: 100 MCG/2ML | INTRAMUSCULAR | Qty: 2

## 2020-10-21 MED FILL — LIDOCAINE VISCOUS HCL 2 % MT SOLN: 2 % | OROMUCOSAL | Qty: 15

## 2020-10-21 MED FILL — LORAZEPAM 2 MG/ML IJ SOLN: 2 mg/mL | INTRAMUSCULAR | Qty: 1

## 2020-10-21 MED FILL — ONDANSETRON HCL 4 MG/2ML IJ SOLN: 4 MG/2ML | INTRAMUSCULAR | Qty: 2

## 2020-10-21 MED FILL — KETOROLAC TROMETHAMINE 15 MG/ML IJ SOLN: 15 mg/mL | INTRAMUSCULAR | Qty: 1

## 2020-10-21 MED FILL — MORPHINE SULFATE 4 MG/ML IV SOLN: 4 mg/mL | INTRAVENOUS | Qty: 1

## 2020-10-21 MED FILL — MAG-AL PLUS 200-200-20 MG/5ML PO LIQD: 200-200-20 MG/5ML | ORAL | Qty: 30

## 2020-10-22 ENCOUNTER — Inpatient Hospital Stay
Admit: 2020-10-22 | Discharge: 2020-10-23 | Disposition: A | Discharge status: Home / Self Care | Payer: BLUE CROSS/BLUE SHIELD | Attending: Emergency Medicine

## 2020-10-22 ENCOUNTER — Emergency Department: Admit: 2020-10-23 | Payer: BLUE CROSS/BLUE SHIELD

## 2020-10-22 ENCOUNTER — Encounter

## 2020-10-22 DIAGNOSIS — R1011 Right upper quadrant pain: Secondary | ICD-10-CM

## 2020-10-22 MED ORDER — LACTATED RINGERS IV BOLUS
INTRAVENOUS | Status: AC
Start: 2020-10-22 — End: 2020-10-22
  Administered 2020-10-23: 1000 mL via INTRAVENOUS

## 2020-10-22 MED ORDER — KETAMINE HCL 50 MG/ML IJ SOLN
50 MG/ML | INTRAMUSCULAR | Status: AC
Start: 2020-10-22 — End: 2020-10-22
  Administered 2020-10-23: 5 mg/kg via INTRAVENOUS

## 2020-10-22 MED ORDER — NORMAL SALINE FLUSH 0.9 % IV SOLN
0.9 | Freq: Three times a day (TID) | INTRAVENOUS | Status: DC
Start: 2020-10-22 — End: 2020-10-23

## 2020-10-22 MED FILL — KETAMINE HCL 50 MG/ML IJ SOLN: 50 mg/mL | INTRAMUSCULAR | Qty: 10

## 2020-10-22 NOTE — ED Triage Notes (Signed)
Pt arrives from home via POV with mask in place.  Pt is here with mother.  Pt has chronic pancreatic insufficiency.  Has not been able to control pain today.  Mom has given ibuprophen 800mg , 1gm tylenol and 10 hydrocodone, 4mg  ativan, 8 zofran.  Pt has hx of autism.  Has an appointment coming up at [facility].

## 2020-10-22 NOTE — ED Notes (Signed)
I have reviewed discharge instructions with the parent.  The parent verbalized understanding.    Patient left ED via Discharge Method: wheelchair to Home with mother.     Opportunity for questions and clarification provided.       Patient given 0 scripts.         To continue your aftercare when you leave the hospital, you may receive an automated call from our care team to check in on how you are doing.  This is a free service and part of our promise to provide the best care and service to meet your aftercare needs.??? If you have questions, or wish to unsubscribe from this service please call [phone].  Thank you for Choosing our [facility] Emergency Department.      Joanell Rising, RN  10/22/20 2352

## 2020-10-22 NOTE — Discharge Instructions (Addendum)
Follow-up at [facility].  Return for worsening or concerning symptoms.

## 2020-10-22 NOTE — ED Provider Notes (Signed)
[facility] Emergency Department Provider Note                   PCP:                PROVIDER Verita Schneiders, MD               Age: 20 y.o.      Sex: female       ICD-10-CM    1. Chronic abdominal pain  R10.9     G89.29        DISPOSITION Decision To Discharge 10/22/2020 11:33:19 PM       New Prescriptions    No medications on file       Orders Placed This Encounter   Procedures   ??? CT ABDOMEN PELVIS W IV CONTRAST Additional Contrast? Oral   ??? CBC with Diff   ??? CMP   ??? Lipase   ??? Protime-INR   ??? Diet NPO   ??? POCT Urine Dipstick   ??? POC Pregnancy Urine Qual   ??? POCT Urinalysis no Micro   ??? POC Pregnancy Urine Qual   ??? Saline lock IV        Waylan Rocher, MD 11:35 PM      MDM  Number of Diagnoses or Management Options  Chronic abdominal pain  Diagnosis management comments: I wore appropriate PPE throughout this patient's ED visit. Waylan Rocher, MD, 8:07 PM    Will try low dose ketamine. Had [place] in ED 2 days ago without signs of post op complications. Has had multiple CT's in past and family would like to avoid further radiation.    11:35 PM  Family decided that they would prefer to proceed with a CT due to recent reported fevers.  CT did not show any postsurgical complications.  Possible ruptured ovarian cyst with moderate free fluid in the pelvis and 13 mm right ovarian cystic structure.  Patient has been resting comfortably and pain has resolved.  Labs otherwise unremarkable.  Will follow-up at [facility].       Amount and/or Complexity of Data Reviewed  Clinical lab tests: ordered and reviewed  Tests in the radiology section of CPT??: ordered and reviewed  Tests in the medicine section of CPT??: reviewed and ordered  Review and summarize past medical records: yes  Independent visualization of images, tracings, or specimens: yes         Robin Mendoza is a 20 y.o. female who presents to the Emergency Department with chief complaint of    Chief Complaint   Patient presents with   ??? Abdominal Pain      20 year old female with  a history of eosinophilic esophagitis, GERD, ADHD, autism, laparoscopic cholecystectomy and appendectomy 2 weeks ago, and recently diagnosed pancreatic insufficiency based on stool testing presents with uncontrolled chronic right upper quadrant pain.  Family states surgery did not help to control her pain.  She was given Dilaudid postop and developed itching, so that he believes she may have an allergy to it.  She has tried Toradol, morphine, and fentanyl without improvement.  She has taken 1 g of Tylenol, 800 mg of ibuprofen, 10 mg of hydrocodone, 4 mg of Ativan, 8 mg of Zofran, and a THC gummy without improvement today.  She has an appointment at [facility] in 2 days for further evaluation.  Mother reports a temperature of 100.8 at home today.  She denies urinary symptoms.  She has occasional nausea and diarrhea.  No vomiting.  Review of Systems   Constitutional: Negative for fever.   HENT: Negative for facial swelling.    Eyes: Negative for visual disturbance.   Respiratory: Negative for shortness of breath.    Cardiovascular: Negative for chest pain.   Gastrointestinal: Positive for abdominal pain, diarrhea and nausea. Negative for vomiting.   Genitourinary: Negative for dysuria.   Musculoskeletal: Negative for joint swelling.   Skin: Negative for rash.   Neurological: Negative for speech difficulty.   Psychiatric/Behavioral: Negative for confusion. The patient is nervous/anxious.    All other systems reviewed and are negative.      Past Medical History:   Diagnosis Date   ??? ADHD     managed with meds   ??? Autism    ??? Depression    ??? GERD (gastroesophageal reflux disease)    ??? Weight loss         Past Surgical History:   Procedure Laterality Date   ??? CHOLECYSTECTOMY, LAPAROSCOPIC N/A 10/05/2020    CHOLECYSTECTOMY LAPAROSCOPIC performed by Azalia Bilis, MD at [facility] MAIN OR   ??? COLONOSCOPY N/A 09/17/2020    COLONOSCOPY/BMI 21 performed by Flint Melter, MD at [facility] ENDOSCOPY   ??? COLONOSCOPY N/A 01/14/2019     COLONOSCOPY/  BMI 21 PT IS AUTISTIC performed by Flint Melter, MD at [place]   ??? EAR SURGERY Bilateral     as a baby   ??? LAPAROSCOPIC APPENDECTOMY N/A 10/05/2020    APPENDECTOMY LAPAROSCOPIC performed by Azalia Bilis, MD at [facility] MAIN OR   ??? UPPER GASTROINTESTINAL ENDOSCOPY          Family History   Problem Relation Age of Onset   ??? Cancer Mother         bilateral breast cancer   ??? No Known Problems Father    ??? Hypertension Mother            Social Connections:    ??? Frequency of Communication with Friends and Family: Not on file   ??? Frequency of Social Gatherings with Friends and Family: Not on file   ??? Attends Religious Services: Not on file   ??? Active Member of Clubs or Organizations: Not on file   ??? Attends Archivist Meetings: Not on file   ??? Marital Status: Not on file        Allergies   Allergen Reactions   ??? Cow's Milk Anaphylaxis and Hives   ??? Food Anaphylaxis   ??? Lac Bovis Anaphylaxis   ??? Albumen, Egg Other (See Comments)   ??? Dog Epithelium Allergy Skin Test Itching   ??? Other Itching        Vitals signs and nursing note reviewed.   Patient Vitals for the past 4 hrs:   Temp Pulse Resp BP SpO2   10/22/20 2206 -- -- -- -- 96 %   10/22/20 2151 -- -- -- -- 96 %   10/22/20 2136 -- -- -- -- 96 %   10/22/20 2121 -- -- -- -- 97 %   10/22/20 2106 -- -- -- -- 97 %   10/22/20 2051 -- -- -- -- 98 %   10/22/20 2036 -- -- -- -- 97 %   10/22/20 2015 -- -- -- 122/86 98 %   10/22/20 1938 98 ??F (36.7 ??C) (!) 116 22 (!) 148/98 98 %          Physical Exam  Vitals and nursing note reviewed.   Constitutional:  Appearance: Normal appearance. She is well-developed.   HENT:      Head: Normocephalic and atraumatic.      Nose: Nose normal.      Mouth/Throat:      Mouth: Mucous membranes are moist.   Eyes:      Extraocular Movements: Extraocular movements intact.      Pupils: Pupils are equal, round, and reactive to light.   Cardiovascular:      Rate and Rhythm: Normal rate and regular rhythm.    Pulmonary:      Effort: Pulmonary effort is normal. No respiratory distress.   Abdominal:      General: Abdomen is flat. There is no distension.      Tenderness: There is abdominal tenderness in the right upper quadrant and right lower quadrant. There is guarding.   Musculoskeletal:         General: Normal range of motion.   Skin:     General: Skin is warm and dry.   Neurological:      General: No focal deficit present.      Mental Status: She is alert. Mental status is at baseline.   Psychiatric:         Mood and Affect: Mood is anxious.          Procedures      Labs Reviewed   CBC WITH AUTO DIFFERENTIAL - Abnormal; Notable for the following components:       Result Value    RBC 3.47 (*)     Hemoglobin 10.0 (*)     Hematocrit 30.4 (*)     Lymphocytes 45 (*)     All other components within normal limits   COMPREHENSIVE METABOLIC PANEL - Abnormal; Notable for the following components:    Chloride 111 (*)     Total Bilirubin 0.1 (*)     AST 12 (*)     Total Protein 5.7 (*)     Albumin 3.0 (*)     Albumin/Globulin Ratio 1.1 (*)     All other components within normal limits   LIPASE   PROTIME-INR   POCT URINALYSIS DIPSTICK   POC PREGNANCY UR-QUAL   POC PREGNANCY UR-QUAL        CT ABDOMEN PELVIS W IV CONTRAST Additional Contrast? Oral   Final Result      1. Moderate free fluid in the pelvis, nonspecific but may be secondary to   ruptured ovarian cyst.      2. Moderate stool volume in the colon. Apparent mild wall thickening of the   sigmoid colon is most likely due to underdistention.      3. No evidence of colitis, diverticulitis or bowel obstruction. No   hydronephrosis.      4. IUD within the uterus.                                   Voice dictation software was used during the making of this note.  This software is not perfect and grammatical and other typographical errors may be present.  This note has not been completely proofread for errors.     Waylan Rocher, MD  10/22/20 [phone]

## 2020-10-22 NOTE — ED Notes (Signed)
Notified CT that pt finished gastroview at [facility], RN  10/22/20 2206

## 2020-10-22 NOTE — Care Coordination-Inpatient (Signed)
Ambulatory Care Coordination Note  10/22/2020  CM Risk Score: 0  [place]son 10 Year Mortality Risk Score: 2%   Admission or ED Risk Score  39%    ACC: Virgina Jock, RN      Initial Care Management note    Patient identified as eligible for Employee Care Management.  Care Manager contacted the patient, verified DOB and zip code as identifiers. Provided introduction and explanation of the Nurse Care Manager role.  Agreed to CM follow-up and assistance.  CM initial assessment completed.    20 yo female presented on 10/20/20 to [facility] ER for worsening, uncontrolled RUQ abdominal pain, nausea/vomiting, and inability to take in much solid food, tolerating oral liquids in small amounts;  Patient has continuous abd pain, though waxes and wanes to severe level at time, no triggers noted;  Ongoing symptoms for past several months, but recently increased in severity;  Patient is also 2 weeks s/p lap cholecystectomy and appendectomy, but GI symptoms have not resolved postop;  Mother also reported patient tested positive for Covid 19 by home test about 4 weeks ago, but was minimally symptomatic at that time;         PMH:  Anaphylactic milk allergy (Epipen), High functioning Autistic, Eosinophilic Esophagitis (controlled on Xolair), Anxiety, ADHD;    ER Diagnostics  Abdominal Ultrasound without signs of postop complications;  ER and Recent Labwork:     Protime 15(H),  Vitamin D 16 (L), Elevated Triglycerides 217(H), Total Protein 2.7(L), Hgb 11.9, Low fecal Calprotectin level;     Awaiting results on genetic mutation testing for PRSS1, SPINK1 and CFTR performed on 10/18/20 (pancreatic);      CM follow up to check on if sent out and to which outside lab facility;   Have not noted testing for Vitamin K,   09/23/2020 CT abdomen:  Pronounced wall thickening of the terminal ileum, with focal cecal wall thickening, diffuse colon distention and mucosal hyperenhancement from the cecum to the rectum, worst at rectum. This is a  terminal ileitis and diffuse colitis;        Red Flags to Monitor at home:     Call your doctor now or seek immediate medical care if you develop:  o Fever >101, chills  o Inability to keep down food or liquids  o Increasing, uncontrolled belly pain  o Inability to pass gas or stool in 2 days  o bright red stool, or fatty, floating stools    Medications:   New Medications at Discharge:    None  Changed Medications at Discharge:   None  Discontinued Medications at Discharge:   None    Support system:  mother    ACM reviewed:   Discharge instructions, plan of care, and red flags to monitor with parent who verbalized understanding.    Were discharge instructions available to patient? yes.       Discussed appropriate site of care needs based on patient's symptoms and resources available to patient at that time, including use of the Nurse Access Line at [phone] to identify appropriate level of care for current service needs and in-network care, such as Walk In or Urgent Care clinic, and [employer] care online, when PCP or Specialist is unable to see patient, prior to going to the ER with lower level of care needs.   Patient agrees to contact the PCP or Specialist's office for questions or follow up related to their healthcare or any urgent needs.     Barriers  Identified to Treatment Plan / Challenges to Care:  []  Decline in memory    []  Language barrier     []  Emotional    []  Limited mobility  []  Lack of motivation       []  Vision, hearing or cognitive impairment    []  Knowledge    []  Financial    []  Lack of support  [x]  Uncontrolled Pain  []  Other []  None  ?? Patient reports she continues to be in significant RUQ abdominal pain, throbbing, sharp at times and burns;  ?? CM discussed some non-pharmacologic pain relief measures  ?? Mother states these have been tried and failed;  ?? Today patient's mother tried  Ibuprofen 800 mg, Tylenol, Hydrocodone 10mg , Ativan x 4, Zofran tabs, and a THC gummy without  improvement.  ?? Diarrhea several times today so far, uncontrolled on current regimen;      [place] follow up appointment(s):    ?? [facility] scheduled on 11/27/20 in their new GI Lab (Possibly Out of Network?)      Non-[facility] follow up appointment(s):      ?? Gastroenterology at [facility] Dr Roseanne Reno on 10/24/20;  ?? [facility] follow up with Dr Dot Been on 12/07/20;  ?? Waiting on results to return on testing for HLADQ2 - was sent to [facility] by Dr Trixie Deis;    Patient provided opportunity to ask further questions.  No other clinical, social, or functional needs identified. Patient verbalized understanding of all information discussed.  Patient received my contact information for any further questions, concerns, or needs.      Ambulatory Care Coordination Assessment    Care Coordination Protocol  Referral from Primary Care Provider: No  Week 1 - Initial Assessment     Do you have all of your prescriptions and are they filled?: Yes  Barriers to medication adherence: None  Are you able to afford your medications?: Yes  How often do you have trouble taking your medications the way you have been told to take them?: I always take them as prescribed.     Do you have Home O2 Therapy?: No      Ability to seek help/take action for Emergent Urgent situations i.e. fire, crime, inclement weather or health crisis.: Independent  Ability to ambulate to restroom: Independent  Ability handle personal hygeine needs (bathing/dressing/grooming): Independent  Ability to manage Medications: Needs Assistance  Ability to prepare Food Preparation: Independent  Ability to maintain home (clean home, laundry): Independent  Ability to drive and/or has transportation: Needs Assistance  Ability to do shopping: Independent  Ability to manage finances: Independent  Is patient able to live independently?: No     Current Housing: Private Residence        Per the [place], did the patient have 2 or more  falls or 1 fall with injury in the past year?: No           Suggested Interventions and Community Resources                Prior to Admission medications    Medication Sig Start Date End Date Taking? Authorizing Provider   promethazine (PHENERGAN) 25 MG tablet Take 25 mg by mouth every 6 hours as needed for Nausea   Yes Historical Provider, MD   vitamin D (ERGOCALCIFEROL) 1.25 MG (50000 UT) CAPS capsule  10/18/20  Yes Historical Provider, MD   ZENPEP 20000-63000 units CPEP delayed release capsule  10/09/20  Yes Historical Provider,  MD   ondansetron (ZOFRAN-ODT) 8 MG TBDP disintegrating tablet Place 1 tablet under the tongue every 8 hours as needed for Nausea or Vomiting 10/05/20  Yes Azalia Bilis, MD   OMALIZUMAB 150 MG injection 150 mg every 28 days  08/01/20  Yes Historical Provider, MD   VYVANSE 60 MG CHEW  09/12/20  Yes Historical Provider, MD   guanFACINE (INTUNIV) 1 MG TB24 extended release tablet Take 1 mg by mouth daily  07/11/20  Yes Historical Provider, MD   Sertraline HCl (ZOLOFT PO) Take 75 mg by mouth daily   Yes Historical Provider, MD   Famotidine (PEPCID PO) Take 10 mg by mouth daily   Yes Historical Provider, MD   levonorgestrel (MIRENA, 52 MG,) IUD 52 mg 1 each by IntraUTERine route once   Yes Historical Provider, MD   ondansetron (ZOFRAN-ODT) 4 MG disintegrating tablet Take 4 mg by mouth every 8 hours as needed  Patient not taking: Reported on 10/22/2020 10/22/19   Ar Automatic Reconciliation       Goals Addressed                    This Visit's Progress    ???  Patient Stated (pt-stated)         Goal:  Reports exacerbations of acute pain are controlled and she can return to prior activities and level of function;    Barriers:   Difficulty managing acute pain, unknown source;  Plan for overcoming my barriers:      ??? Urgent Gastro Initial Consult at [facility] on June 15th;  ??? Attempts to find nonpharmacologic sources of pain relief;    Confidence: 9/10  Anticipated Goal Completion Date:  11/21/20 and ongoing  during workup;      ???  Self Monitoring         Demonstrates no further weight loss below current BMI of 17 over the next two months;    Patient Reported Weight No flowsheet data found.    Barriers: Acute abdominal pain leading to poor po intake   Plan for overcoming my barriers:     ??? Identify cause of pain, control acute pain;   ??? Allergic to all diary, cow's milk - patient is vegan  ??? Attempt high plant protein alternatives in small amounts    Confidence: 8/10  Anticipated Goal Completion Date:                 Education Documentation  Manage Eating Problems (eg:  Nausea; Mucositis; Taste Changes; Diarrhea), taught by Virgina Jock, RN at 10/22/2020  9:16 PM.  Learner: Patient  Readiness: Acceptance  Method: Explanation  Response: Needs Reinforcement    Oral Nutrition (eg:  Nutritional Supplements when oral intake suboptimal; Prescribed Diet; Manage Eating Problems), taught by Virgina Jock, RN at 10/22/2020  9:16 PM.  Learner: Patient  Readiness: Acceptance  Method: Explanation  Response: Needs Reinforcement    Educate notification to healthcare provider of episodes of pain, taught by Virgina Jock, RN at 10/22/2020  9:16 PM.  Learner: Patient  Readiness: Acceptance  Method: Explanation  Response: Needs Reinforcement    Educate non-pharmacologic comfort measures, taught by Virgina Jock, RN at 10/22/2020  9:16 PM.  Learner: Patient  Readiness: Acceptance  Method: Explanation  Response: Needs Reinforcement    Education Comments  No comments found.      Plan for follow-up call in 1-2 days to continue CM support for follow up after [facility] Consult June 15th Wednesday;

## 2020-10-23 ENCOUNTER — Encounter

## 2020-10-23 LAB — POCT URINALYSIS DIPSTICK
Bilirubin, Urine, POC: NEGATIVE
Blood, UA POC: NEGATIVE
Glucose, UA POC: NEGATIVE mg/dL
Ketones, Urine, POC: NEGATIVE mg/dL
Leukocyte Est, UA POC: NEGATIVE
Nitrite, Urine, POC: NEGATIVE
Protein, Urine, POC: NEGATIVE mg/dL
Specific Gravity, Urine, POC: 1.015 (ref 1.001–1.023)
URINE UROBILINOGEN POC: 0.2 EU/dL (ref 0.2–1.0)
pH, Urine, POC: 7 (ref 5.0–9.0)

## 2020-10-23 LAB — CBC WITH AUTO DIFFERENTIAL
Absolute Eos #: 0.2 10*3/uL (ref 0.0–0.8)
Absolute Immature Granulocyte: 0 10*3/uL (ref 0.0–0.5)
Absolute Lymph #: 2.4 10*3/uL (ref 0.5–4.6)
Absolute Mono #: 0.5 10*3/uL (ref 0.1–1.3)
Basophils Absolute: 0 10*3/uL (ref 0.0–0.2)
Basophils: 1 % (ref 0.0–2.0)
Eosinophils %: 3 % (ref 0.5–7.8)
Hematocrit: 30.4 % — ABNORMAL LOW (ref 35.8–46.3)
Hemoglobin: 10 g/dL — ABNORMAL LOW (ref 11.7–15.4)
Immature Granulocytes: 0 % (ref 0.0–5.0)
Lymphocytes: 45 % — ABNORMAL HIGH (ref 13–44)
MCH: 28.8 PG (ref 26.1–32.9)
MCHC: 32.9 g/dL (ref 31.4–35.0)
MCV: 87.6 FL (ref 79.6–97.8)
MPV: 10.7 FL (ref 9.4–12.3)
Monocytes: 8 % (ref 4.0–12.0)
Platelets: 228 10*3/uL (ref 150–450)
RBC: 3.47 M/uL — ABNORMAL LOW (ref 4.05–5.2)
RDW: 14.2 % (ref 11.9–14.6)
Seg Neutrophils: 43 % (ref 43–78)
Segs Absolute: 2.3 10*3/uL (ref 1.7–8.2)
WBC: 5.4 10*3/uL (ref 4.3–11.1)
nRBC: 0 10*3/uL (ref 0.0–0.2)

## 2020-10-23 LAB — POC PREGNANCY UR-QUAL: Preg Test, Ur: NEGATIVE

## 2020-10-23 LAB — COMPREHENSIVE METABOLIC PANEL
ALT: 16 U/L (ref 12–65)
AST: 12 U/L — ABNORMAL LOW (ref 15–37)
Albumin/Globulin Ratio: 1.1 — ABNORMAL LOW (ref 1.2–3.5)
Albumin: 3 g/dL — ABNORMAL LOW (ref 3.5–5.0)
Alk Phosphatase: 65 U/L (ref 50–136)
Anion Gap: 8 mmol/L (ref 7–16)
BUN: 8 MG/DL (ref 6–23)
CO2: 25 mmol/L (ref 21–32)
Calcium: 8.8 MG/DL (ref 8.3–10.4)
Chloride: 111 mmol/L — ABNORMAL HIGH (ref 98–107)
Creatinine: 0.6 MG/DL (ref 0.6–1.0)
GFR African American: >60 mL/min/{1.73_m2} (ref >60)
GFR Non-African American: >60 mL/min/{1.73_m2} (ref >60)
Globulin: 2.7 g/dL (ref 2.3–3.5)
Glucose: 95 mg/dL (ref 65–100)
Potassium: 3.7 mmol/L (ref 3.5–5.1)
Sodium: 144 mmol/L (ref 136–145)
Total Bilirubin: 0.1 MG/DL — ABNORMAL LOW (ref 0.2–1.1)
Total Protein: 5.7 g/dL — ABNORMAL LOW (ref 6.3–8.2)

## 2020-10-23 LAB — PROTIME-INR
INR: 1
Protime: 13.6 s (ref 12.6–14.5)

## 2020-10-23 LAB — LIPASE: Lipase: 108 U/L (ref 73–393)

## 2020-10-23 MED ORDER — SODIUM CHLORIDE 0.9 % IV BOLUS
0.9 % | Freq: Once | INTRAVENOUS | Status: AC
Start: 2020-10-23 — End: 2020-10-22
  Administered 2020-10-23: 03:00:00 100 mL via INTRAVENOUS

## 2020-10-23 MED ORDER — PROCHLORPERAZINE EDISYLATE 10 MG/2ML IJ SOLN
10 MG/2ML | Freq: Once | INTRAMUSCULAR | Status: AC
Start: 2020-10-23 — End: 2020-10-22
  Administered 2020-10-23: 01:00:00 5 mg via INTRAVENOUS

## 2020-10-23 MED ORDER — DIATRIZOATE MEGLUMINE & SODIUM 66-10 % PO SOLN
66-10 % | ORAL | Status: AC
Start: 2020-10-23 — End: 2020-10-22
  Administered 2020-10-23: 01:00:00 30 mL via ORAL

## 2020-10-23 MED ORDER — DIPHENHYDRAMINE HCL 50 MG/ML IJ SOLN
50 MG/ML | INTRAMUSCULAR | Status: AC
Start: 2020-10-23 — End: 2020-10-22
  Administered 2020-10-23: 01:00:00 25 mg via INTRAVENOUS

## 2020-10-23 MED ORDER — NORMAL SALINE FLUSH 0.9 % IV SOLN
0.9 % | Freq: Once | INTRAVENOUS | Status: AC | PRN
Start: 2020-10-23 — End: 2020-10-22
  Administered 2020-10-23: 03:00:00 10 mL via INTRAVENOUS

## 2020-10-23 MED ORDER — IOPAMIDOL 76 % IV SOLN
76 % | Freq: Once | INTRAVENOUS | Status: AC | PRN
Start: 2020-10-23 — End: 2020-10-22
  Administered 2020-10-23: 03:00:00 100 mL via INTRAVENOUS

## 2020-10-23 MED FILL — PROCHLORPERAZINE EDISYLATE 10 MG/2ML IJ SOLN: 10 MG/2ML | INTRAMUSCULAR | Qty: 2

## 2020-10-23 MED FILL — DIPHENHYDRAMINE HCL 50 MG/ML IJ SOLN: 50 mg/mL | INTRAMUSCULAR | Qty: 1

## 2020-10-23 MED FILL — MD-GASTROVIEW 66-10 % PO SOLN: 66-10 % | ORAL | Qty: 30

## 2020-10-23 NOTE — Care Coordination-Inpatient (Signed)
Ambulatory Care Manager [facility]) contacted the patient's mother, Dr. Wynetta Emery, by telephone to follow up on progress, discuss new issues or concerns, and reinforce/ provide patient education. Verified name and DOB with patient as identifiers.  Patient returned to the ER at [facility] last evening with acute exacerbation of recent abdominal pain;      20 yo female with history of autism presented on 10/20/20 to [facility] ER and return to the ER on 10/23/20 for worsening, uncontrolled, continuous RUQ abdominal pain, crying and writhing in pain at home om 10/23/20;  Abd Pain reported as severe, no triggers noted;  2 weeks s/p lap cholecystectomy and appendectomy;  Surgery did not resolve the ongoing GI symptoms, including pain;     ER Labwork:  Hgb 10.0(L);  Hct 30.4(L);  Total protein 5.7(L);  Albumin 3.0(L);  - all lower than from weekend values;     CT Abdomen and Pelvis w/ IV contrast:  ?? Minimal dependent subsegmental atelectasis in lung bases, trace bilateral pleural effusions;  ?? Mild sigmoid colon wall thickening, colon normal in course with moderate stool volume;  ?? 19mm cystic lesion in the Right Adnexa, moderate free fluid in pelvis, no free air;         PMH:  Anaphylactic milk allergy (Epipen), High functioning Autistic, Eosinophilic Esophagitis (controlled on Xolair), Anxiety, ADHD;       Summary Note:     ?? Patient and her mother are driving to [facility] today for Gastroenterology urgent consult in AM;  ?? CM followed up with the Manager of the [facility] outpatient lab  ?? Spoke with Loran Senters and confirmed the triple screen tests of         Plan:  Follow up in 2 days with patient's mother to determine any next steps after visit at [facility];

## 2020-10-25 LAB — VITAMIN A: VITAMIN A: 49.5 ug/dL (ref 18.9–57.3)

## 2020-10-25 LAB — VITAMIN E
Vitamin E Alpha: 9.2 mg/L (ref 5.9–19.4)
Vitamin E Gamma: 0.4 mg/L — ABNORMAL LOW (ref 0.7–4.9)

## 2020-10-31 LAB — MISCELLANEOUS SENDOUT

## 2020-11-01 ENCOUNTER — Encounter: Admit: 2020-11-01 | Payer: BLUE CROSS/BLUE SHIELD

## 2020-11-01 ENCOUNTER — Encounter

## 2020-11-01 DIAGNOSIS — R1031 Right lower quadrant pain: Secondary | ICD-10-CM

## 2020-11-01 NOTE — Addendum Note (Signed)
Addended by: Trish Fountain A on: 11/01/2020 02:50 PM     Modules accepted: Orders

## 2020-11-01 NOTE — Addendum Note (Signed)
Addended by: Trish Fountain A on: 11/01/2020 03:07 PM     Modules accepted: Orders

## 2020-11-01 NOTE — Addendum Note (Signed)
Addended by: Imagene Riches A on: 11/01/2020 02:55 PM     Modules accepted: Orders

## 2020-11-02 ENCOUNTER — Encounter

## 2020-11-02 ENCOUNTER — Telehealth

## 2020-11-02 LAB — CBC WITH AUTO DIFFERENTIAL
Absolute Eos #: 0.2 10*3/uL (ref 0.0–0.8)
Absolute Immature Granulocyte: 0 10*3/uL (ref 0.0–0.5)
Absolute Lymph #: 2.3 10*3/uL (ref 0.5–4.6)
Absolute Mono #: 0.7 10*3/uL (ref 0.1–1.3)
Basophils Absolute: 0.1 10*3/uL (ref 0.0–0.2)
Basophils: 1 % (ref 0.0–2.0)
Eosinophils %: 2 % (ref 0.5–7.8)
Hematocrit: 38.7 % (ref 35.8–46.3)
Hemoglobin: 12.6 g/dL (ref 11.7–15.4)
Immature Granulocytes: 0 % (ref 0.0–5.0)
Lymphocytes: 25 % (ref 13–44)
MCH: 29.5 PG (ref 26.1–32.9)
MCHC: 32.6 g/dL (ref 31.4–35.0)
MCV: 90.6 FL (ref 79.6–97.8)
MPV: 10.8 FL (ref 9.4–12.3)
Monocytes: 7 % (ref 4.0–12.0)
Platelets: 323 10*3/uL (ref 150–450)
RBC: 4.27 M/uL (ref 4.05–5.2)
RDW: 15.3 % — ABNORMAL HIGH (ref 11.9–14.6)
Seg Neutrophils: 65 % (ref 43–78)
Segs Absolute: 6 10*3/uL (ref 1.7–8.2)
WBC: 9.2 10*3/uL (ref 4.3–11.1)
nRBC: 0 10*3/uL (ref 0.0–0.2)

## 2020-11-02 LAB — C-REACTIVE PROTEIN: CRP: 0.7 mg/dL (ref 0.0–0.9)

## 2020-11-02 NOTE — Telephone Encounter (Signed)
omp

## 2020-11-04 LAB — H. PYLORI ANTIGEN, STOOL: H Pylori Ag, Stool: NEGATIVE

## 2020-11-05 LAB — C1 ESTERASE INHIBITOR, FUNCTIONAL: C1 Esterase Inh Funct: >100 %mean normal

## 2020-11-05 LAB — GASTROINTESTINAL PANEL, MOLECULAR
Adenovirus F 40 41 PCR: NOT DETECTED
Astrovirus PCR: NOT DETECTED
Campylobacter PCR: NOT DETECTED
Clostridium Diff Toxin A/B PCR: NOT DETECTED
Cryptosporidium PCR: NOT DETECTED
Cyclospora Cayetanensis PCR: NOT DETECTED
E Coli Enteroaggregative PCR: NOT DETECTED
E Coli Enteropathogenic PCR: NOT DETECTED
E Coli Enterotoxigenic PCR: NOT DETECTED
E Coli Shiga Like Toxin PCR: NOT DETECTED
E Coli Shigella/Enteroinvasive PCR: NOT DETECTED
Entamoeba Histolytica PCR: NOT DETECTED
Giardia Lamblia PCR: NOT DETECTED
Norovirus GI GII PCR: NOT DETECTED
Plesiomonas Shigelloides PCR: NOT DETECTED
Rotavirus A PCR: NOT DETECTED
Salmonella PCR: NOT DETECTED
Sapovirus PCR: NOT DETECTED
Vibrio Cholerae PCR: NOT DETECTED
Vibrio PCR: NOT DETECTED
Yersinia Enterocolitica PCR: NOT DETECTED

## 2020-11-06 LAB — CALPROTECTIN STOOL: Calprotectin: 17 ug/g (ref 0–120)

## 2020-11-06 NOTE — Care Coordination-Inpatient (Signed)
Ambulatory Care Coordination Note     Care Manager contacted the patient by telephone in follow up.  Verified DOB and zip code with patient as identifiers.      20 yo female with continued symptoms of pain, nausea and diarrhea of unknown etiology;  Mother continues to support patient through ongoing workup and awaiting results for a few outstanding tests.  CM outreach to Robin Mendoza, lab Supervisor to check on test results.  As follows:   ?? Calprotectin, Fecal  6 days before resulting    ?? HLA DQ-2, HLA DQ-8 (both blood tests, sent June 9th)   ?? Robin Mendoza was unable to locate these tests being completed in her research;  CM outreach to let patient's mother, Robin Quigley, MD, left VM message and will await response back;     Admission or ED Risk score:    39%    Changes / Goals addressed since last outreach:   ?? [facility] Visit with Dr Ellison Hughs 10/24/2020   ?? Recommendations   ?? Labs, Stool studies   ?? Will discuss utility of repeat colonoscopy or further abdominal imaging   ?? Increase amitriptyline to 25 mg nightly at bedtime  ?? Referral to [facility]   ?? Future considerations: add lyrica, consider transition of zoloft to cymbalta   ?? 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 Dr Sharen Counter GI psychology may be a resource for helping manage and cope with ongoing abdominal pain as well   ?? Xifaxan 1 tablet 3 times daily for 2 weeks   ?? Return next available; Return sooner if any symptoms or concerns      Plan for follow-up call in 1-2 days to continue CM support for locating test results at end of week.       Care Coordination Interventions    Referral from Primary Care Provider: No  Suggested Interventions and Community Resources         Goals Addressed    None         Prior to Admission medications    Medication Sig Start Date End Date Taking? Authorizing Provider   promethazine (PHENERGAN) 25  MG tablet Take 25 mg by mouth every 6 hours as needed for Nausea    Historical Provider, MD   vitamin D (ERGOCALCIFEROL) 1.25 MG (50000 UT) CAPS capsule  10/18/20   Historical Provider, MD   ZENPEP 20000-63000 units CPEP delayed release capsule  10/09/20   Historical Provider, MD   ondansetron (ZOFRAN-ODT) 8 MG TBDP disintegrating tablet Place 1 tablet under the tongue every 8 hours as needed for Nausea or Vomiting 10/05/20   Azalia Bilis, MD   OMALIZUMAB 150 MG injection 150 mg every 28 days  08/01/20   Historical Provider, MD   VYVANSE 60 MG CHEW  09/12/20   Historical Provider, MD   guanFACINE (INTUNIV) 1 MG TB24 extended release tablet Take 1 mg by mouth daily  07/11/20   Historical Provider, MD   Sertraline HCl (ZOLOFT PO) Take 75 mg by mouth daily    Historical Provider, MD   Famotidine (PEPCID PO) Take 10 mg by mouth daily    Historical Provider, MD   levonorgestrel (MIRENA, 52 MG,) IUD 52 mg 1 each by IntraUTERine route once    Historical Provider, MD   ondansetron (ZOFRAN-ODT) 4 MG disintegrating tablet Take 4 mg by mouth every 8 hours as  needed  Patient not taking: Reported on 10/22/2020 10/22/19   Ar Automatic Reconciliation       Future Appointments   Date Time Provider [place]   11/08/2020  1:61 PM S[facility] CT 30 SLICE UNIT 1 [facility]

## 2020-11-07 LAB — OVA & PARASITES, STOOL, REFLEX

## 2020-11-07 LAB — OVA AND PARASITE EXAMINATION

## 2020-11-07 NOTE — Care Coordination-Inpatient (Signed)
Incoming mom, was able to see the fecal calprotectin result this morning.  Agreed this CM saw nothing on HLA DQ 2 or HLA DQ 8, believes those two tests were cancelled by the lab on 10/18/20;      CMutreach to mom with above update on last two tests.  Also Cm asked if she was having any problems with [employer] Auth approval for [employer].  Await her FU respose;

## 2020-11-08 ENCOUNTER — Inpatient Hospital Stay: Admit: 2020-11-08 | Payer: BLUE CROSS/BLUE SHIELD

## 2020-11-08 DIAGNOSIS — K625 Hemorrhage of anus and rectum: Secondary | ICD-10-CM

## 2020-11-08 MED ORDER — SODIUM CHLORIDE 0.9 % IV BOLUS
0.9 % | Freq: Once | INTRAVENOUS | Status: AC | PRN
Start: 2020-11-08 — End: 2020-11-08
  Administered 2020-11-08: 22:00:00 100 mL via INTRAVENOUS

## 2020-11-08 MED ORDER — BARIUM SULFATE 0.1 % PO SUSP
0.1 % | Freq: Once | ORAL | Status: AC | PRN
Start: 2020-11-08 — End: 2020-11-08
  Administered 2020-11-08: 22:00:00 1350 mL via ORAL

## 2020-11-08 MED ORDER — IOPAMIDOL 76 % IV SOLN
76 % | Freq: Once | INTRAVENOUS | Status: AC | PRN
Start: 2020-11-08 — End: 2020-11-08
  Administered 2020-11-08: 22:00:00 100 mL via INTRAVENOUS

## 2020-11-08 MED ORDER — NORMAL SALINE FLUSH 0.9 % IV SOLN
0.9 % | Freq: Once | INTRAVENOUS | Status: AC | PRN
Start: 2020-11-08 — End: 2020-11-08
  Administered 2020-11-08: 22:00:00 10 mL via INTRAVENOUS

## 2020-11-22 ENCOUNTER — Encounter: Admit: 2020-11-22 | Discharge: 2020-11-22 | Payer: BLUE CROSS/BLUE SHIELD

## 2020-11-22 DIAGNOSIS — R109 Unspecified abdominal pain: Secondary | ICD-10-CM

## 2020-11-22 NOTE — Care Coordination-Inpatient (Signed)
Resolving current episode of Care Management, did not receive response back on last outreach, but has this CM's contact information to reach out when she does have any new CM needs.      Patient/family has the ability to self-manage at this time.  Care management goals have been completed. No further Ambulatory Care Manager follow up scheduled.    Goals Addressed                    This Visit's Progress    ???  COMPLETED: Patient Stated (pt-stated)         Goal:  Reports exacerbations of acute pain are controlled and she can return to prior activities and level of function;    Barriers:   Difficulty managing acute pain, unknown source;  Plan for overcoming my barriers:      ??? Urgent Gastro Initial Consult at [facility] on June 15th;  ??? Attempts to find nonpharmacologic sources of pain relief;    Confidence: 9/10  Anticipated Goal Completion Date:  11/21/20 and ongoing during workup;      ???  COMPLETED: Self Monitoring         Demonstrates no further weight loss below current BMI of 17 over the next two months;    Patient Reported Weight No flowsheet data found.    Barriers: Acute abdominal pain leading to poor po intake   Plan for overcoming my barriers:     ??? Identify cause of pain, control acute pain;   ??? Allergic to all diary, cow's milk - patient is vegan  ??? Attempt high plant protein alternatives in small amounts    Confidence: 8/10  Anticipated Goal Completion Date:                 Patient has Ambulatory Care Manager's contact information for any further questions, concerns, or needs.  Patients upcoming visits:  No future appointments.

## 2020-11-22 NOTE — Addendum Note (Signed)
Addended by: Trish Fountain A on: 11/22/2020 04:14 PM     Modules accepted: Orders

## 2020-11-23 LAB — COMPREHENSIVE METABOLIC PANEL
ALT: 37 U/L (ref 12–65)
AST: 34 U/L (ref 15–37)
Albumin/Globulin Ratio: 1.2 (ref 1.2–3.5)
Albumin: 3.4 g/dL — ABNORMAL LOW (ref 3.5–5.0)
Alk Phosphatase: 91 U/L (ref 50–136)
Anion Gap: 8 mmol/L (ref 7–16)
BUN: 5 MG/DL — ABNORMAL LOW (ref 6–23)
CO2: 23 mmol/L (ref 21–32)
Calcium: 8.8 MG/DL (ref 8.3–10.4)
Chloride: 112 mmol/L — ABNORMAL HIGH (ref 98–107)
Creatinine: 0.7 MG/DL (ref 0.6–1.0)
GFR African American: >60 mL/min/{1.73_m2} (ref >60)
GFR Non-African American: >60 mL/min/{1.73_m2} (ref >60)
Globulin: 2.9 g/dL (ref 2.3–3.5)
Glucose: 94 mg/dL (ref 65–100)
Potassium: 3.7 mmol/L (ref 3.5–5.1)
Sodium: 143 mmol/L (ref 136–145)
Total Bilirubin: 0.2 MG/DL (ref 0.2–1.1)
Total Protein: 6.3 g/dL (ref 6.3–8.2)

## 2020-11-23 LAB — CBC WITH AUTO DIFFERENTIAL
Absolute Eos #: 0.2 10*3/uL (ref 0.0–0.8)
Absolute Immature Granulocyte: 0 10*3/uL (ref 0.0–0.5)
Absolute Lymph #: 1.7 10*3/uL (ref 0.5–4.6)
Absolute Mono #: 0.6 10*3/uL (ref 0.1–1.3)
Basophils Absolute: 0 10*3/uL (ref 0.0–0.2)
Basophils: 1 % (ref 0.0–2.0)
Eosinophils %: 4 % (ref 0.5–7.8)
Hematocrit: 35.9 % (ref 35.8–46.3)
Hemoglobin: 11.6 g/dL — ABNORMAL LOW (ref 11.7–15.4)
Immature Granulocytes: 0 % (ref 0.0–5.0)
Lymphocytes: 26 % (ref 13–44)
MCH: 30.1 PG (ref 26.1–32.9)
MCHC: 32.3 g/dL (ref 31.4–35.0)
MCV: 93 FL (ref 79.6–97.8)
MPV: 10.8 FL (ref 9.4–12.3)
Monocytes: 9 % (ref 4.0–12.0)
Platelets: 283 10*3/uL (ref 150–450)
RBC: 3.86 M/uL — ABNORMAL LOW (ref 4.05–5.2)
RDW: 15.6 % — ABNORMAL HIGH (ref 11.9–14.6)
Seg Neutrophils: 60 % (ref 43–78)
Segs Absolute: 4 10*3/uL (ref 1.7–8.2)
WBC: 6.5 10*3/uL (ref 4.3–11.1)
nRBC: 0 10*3/uL (ref 0.0–0.2)

## 2020-11-23 LAB — PROTIME-INR
INR: 1.1
Protime: 14.4 s (ref 12.6–14.5)

## 2020-11-23 LAB — VITAMIN B12: Vitamin B-12: 580 pg/mL (ref 193–986)

## 2020-11-23 LAB — VITAMIN D 25 HYDROXY: Vit D, 25-Hydroxy: 51.3 ng/mL (ref 30.0–100.0)

## 2020-12-19 ENCOUNTER — Encounter

## 2020-12-20 LAB — CBC WITH AUTO DIFFERENTIAL
Absolute Eos #: 0.2 10*3/uL (ref 0.0–0.8)
Absolute Immature Granulocyte: 0 10*3/uL (ref 0.0–0.5)
Absolute Lymph #: 2.2 10*3/uL (ref 0.5–4.6)
Absolute Mono #: 0.5 10*3/uL (ref 0.1–1.3)
Basophils Absolute: 0.1 10*3/uL (ref 0.0–0.2)
Basophils: 1 % (ref 0.0–2.0)
Eosinophils %: 3 % (ref 0.5–7.8)
Hematocrit: 41.2 % (ref 35.8–46.3)
Hemoglobin: 13.8 g/dL (ref 11.7–15.4)
Immature Granulocytes: 0 % (ref 0.0–5.0)
Lymphocytes: 34 % (ref 13–44)
MCH: 30.5 PG (ref 26.1–32.9)
MCHC: 33.5 g/dL (ref 31.4–35.0)
MCV: 90.9 FL (ref 79.6–97.8)
MPV: 12 FL (ref 9.4–12.3)
Monocytes: 8 % (ref 4.0–12.0)
Platelets: 318 10*3/uL (ref 150–450)
RBC: 4.53 M/uL (ref 4.05–5.2)
RDW: 12.8 % (ref 11.9–14.6)
Seg Neutrophils: 54 % (ref 43–78)
Segs Absolute: 3.6 10*3/uL (ref 1.7–8.2)
WBC: 6.5 10*3/uL (ref 4.3–11.1)
nRBC: 0 10*3/uL (ref 0.0–0.2)

## 2020-12-20 LAB — COMPREHENSIVE METABOLIC PANEL
ALT: 17 U/L (ref 12–65)
AST: 19 U/L (ref 15–37)
Albumin/Globulin Ratio: 1.1 — ABNORMAL LOW (ref 1.2–3.5)
Albumin: 4.1 g/dL (ref 3.5–5.0)
Alk Phosphatase: 97 U/L (ref 50–136)
Anion Gap: 9 mmol/L (ref 7–16)
BUN: 9 MG/DL (ref 6–23)
CO2: 24 mmol/L (ref 21–32)
Calcium: 9.8 MG/DL (ref 8.3–10.4)
Chloride: 107 mmol/L (ref 98–107)
Creatinine: 0.7 MG/DL (ref 0.6–1.0)
GFR African American: >60 mL/min/{1.73_m2} (ref >60)
GFR Non-African American: >60 mL/min/{1.73_m2} (ref >60)
Globulin: 3.6 g/dL — ABNORMAL HIGH (ref 2.3–3.5)
Glucose: 99 mg/dL (ref 65–100)
Potassium: 3.1 mmol/L — ABNORMAL LOW (ref 3.5–5.1)
Sodium: 140 mmol/L (ref 136–145)
Total Bilirubin: 0.3 MG/DL (ref 0.2–1.1)
Total Protein: 7.7 g/dL (ref 6.3–8.2)

## 2020-12-20 LAB — T4, FREE: T4 Free: 1.1 NG/DL (ref 0.78–1.46)

## 2020-12-20 LAB — VITAMIN D 25 HYDROXY: Vit D, 25-Hydroxy: 62.4 ng/mL (ref 30.0–100.0)

## 2020-12-20 LAB — PROLACTIN: Prolactin: 6.1 ng/mL

## 2020-12-24 LAB — PORPHOBILINOGEN, URINE: Porphobilinogen, Ur: 2.2 mg/L — ABNORMAL HIGH (ref 0.0–2.0)

## 2020-12-25 ENCOUNTER — Encounter: Payer: BLUE CROSS/BLUE SHIELD | Attending: Gastroenterology

## 2020-12-25 LAB — THYROID PEROXIDASE ANTIBODY: Thyroid Peroxidase (TPO) Abs: 8 IU/mL (ref 0–34)

## 2020-12-28 LAB — T3, REVERSE: T3, Reverse: 15.3 ng/dL (ref 9.2–24.1)

## 2021-01-02 MED FILL — GUANFACINE HCL ER 4MG TB24: 4 mg | 90 days supply | Qty: 90 | Fill #1 | Status: AC

## 2021-01-02 NOTE — Care Coordination-Inpatient (Signed)
Ambulatory Care Manager [facility]) contacted the patient by telephone due to patient on [place] as admitted to [facility] on 12/26/20.  ACM spoke to patient's mother, Dr.Shannon Truchan. Per Dr Wynetta Emery, patient is being discharged from [facility] later today. Discussed any post discharge needs. Mom request's possible assistance with arranging [place] to follow up with patient after discharge. Provided mom with information for in network [facility] providers and supplied phone and fax for [facility]. Called [facility], spoke with Melissa and made aware of possible referral.   Will plan to call patient 01/03/21 for follow up after discharge. Patient's mother agreeable with plan.

## 2021-01-03 NOTE — Care Coordination-Inpatient (Signed)
[place] Transitions Initial Follow Up Call    Call within 2 business days of discharge: Yes    Patient: Robin Mendoza Patient DOB: 04-09-2001   MRN: [id number]  Reason for Admission: Failure to Thrive, Malnutrition  Discharge Date: 01/02/2021 RARS: No data recorded    Transitions of Care Initial Call    Was this an external facility discharge? Yes, 01/02/2021  Discharge Facility: [place] to be reviewed by the provider   Additional needs identified to be addressed with provider: No               Method of communication with provider : none    Advance Care Planning:   Does patient have an Advance Directive: not on file.     Ambulatory Care Manager contacted the patient by telephone to perform post hospital discharge assessment. Verified name and DOB with patient as identifiers. Provided introduction to self, and explanation of the ACM role.     ACM reviewed discharge instructions, medical action plan and red flags with patient who verbalized understanding. Patient given an opportunity to ask questions and does not have any further questions or concerns at this time. Were discharge instructions available to patient? Yes. Reviewed appropriate site of care based on symptoms and resources available to patient including: PCP  Specialist  Home health  When to call [place]. The patient agrees to contact the PCP office for questions related to their healthcare.     Medication reconciliation was performed with patient, who verbalizes understanding of administration of home medications. Advised obtaining a 90-day supply of all daily and as-needed medications.     Was patient discharged with a pulse oximeter? no    ACM provided contact information. Plan for follow-up call in 3-5 days based on severity of symptoms and risk factors.  Plan for next call:  Follow up pain, N/V, HH SOC  Patient is 20 y.o. female with a PMH of high functioning autism, ADHD, Anxiety (specifically with  medical procedures), and anaphylactic milk protein allergy who presents to [facility] on 12/26/2020 for 35 pound weight loss.   Patient has chronic nausea and vomiting along with upper right quadrant pain and has extensive GI history. She began having abdominal pain in November 2021 after a family trip to [place], everyone had traveler's diarrhea but her pain continued to slightly worsen.  She had an endoscopy with presumed diagnosis of eosinophilic esophagitis, but EOE was not diagnosed and dilatation of esophagus did not fix her pain. She began developing more nausea, very loose stools - which are occasionally bloody, and weight loss. Since March 30th has had a 35 pound weight loss. She had a abdominal CT completed which showed colitis, and then had a colonoscopy completed with a normal result. Had a cholecystectomy and prophylactic appendectomy performed with no improvement, Due to abnormal fecal elastase (112) she was seen at [facility] pancreatic clinic where they concluded she had no pancreatitis. She then went to [facility] main GI clinic where she underwent thorough lab evaluation. Had repeat CT and colonoscopy with same results as previously.Patient was admitted to [facility] on 12/26/20 with BMI of 16.  Hospital Course:  Failure to thrive in adult, improving  Avoidant restrictive food intake disorder  Severe protein calorie malnutrition  -- Noted to have 35lb weight loss over 6 months due to poor po intake at home  -- Adolescent medicine consulted to assist in this treatment plan  -- Chronic abdominal  pain contributing to and decreased appetite  -- Nutrition consulted to create meal plan for patient with calorie counting  -- Patient able to tolerate p.o. intake despite persistent nausea and abdominal pain  -- We will continue this meal plan outpatient with guidance of adolescent medicine and nutrition services  -- Home Health services to provide guidance at home with meal plans with close monitoring of  calorie counting    Concern for refeeding syndrome, resolved  -- Electrolytes trended, including K, Mag, Phos  -- Repleted Phos once (phos 2.1). Repleted to 4.5, stable throughout duration of admission  -- No signs of refeeding syndrome after 72 hours. No need for more labs.    Functional gastroparesis  -- Started on erythromycin 250 mg before meals and bedtime  -- Continue antiemetics (described below)  -- Scheduled Tylenol for abdominal pain every 6 hours; continue outpatient  --Toradol '10mg'$  added as prn for uncontrolled pain; can continue outpatient  -- Pepcid 20 mg added for GI protection  -- Continue Lyrica 25 mg at bedtime    Colitis  Chronic abdominal pain  --Chronic abdominal pain persisted throughout admission  --Previous CT scans remarkable for colitis. Colonoscopies and biopsies unremarkable.  --Colitis work-up unremarkable for: HIV, EBV, CMV, Strongyloides, lipase, AM cortisol, TSH, porphobilinogen; ANA pending  -- Continue Tylenol '650mg'$  q8hrs scheduled, with Toradol '10mg'$  as needed. Continue Pepcid '20mg'$  while taking NSAID.    Chronic nausea and vomiting, improving  -- Vomiting resolved after 2 days of admission with po intake and antiemetic regimen  -- Continue regimen of Zofran '4mg'$  q8hrs prn, Phenergan '25mg'$  BID prn, and Compazine '5mg'$  q8hrs prn  -- Option to add scopolamine patch if medications not helping or want to decrease number of anti-emetics receiving daily    Normocytic anemia   Vitamin B12 deficiency  -- Hb 10.4 and stable; MCV 90s  -- Continue vit B12 supplementation  -- Iron studies also revealed deficiency; no supplementation at this time as this may further exacerbate chronic abdominal pain/GI upset; continue to monitor outpatient    Increased stool burden  --KUB revealed moderately significant stool burden.  -- Started on bowel regimen. BM's became more formed and regular. No signs of hematochezia or melena  -- Continue Miralax + Pericolace daily for 6 weeks    Anxiety  -- May be  contributing to the diagnosis of ARFID, as patient feels anxious about continuing this plan at home  -- Continue Zoloft '50mg'$  daily  -- Added Zyprexa 2.'5mg'$  nightly    ADHD  -- Discontinue Vyvanse as this can contribute to appetite suppression and weight loss  -- Continue Intuniv daily. Consider changing this medication as it may contribute to abdominal pain and weight loss  Patient was discharged home on 01/03/21 with the following discharge instructions:  Continue meal plans as discussed with dietitian. Referral sent for outpatient follow-up.  -- Referral sent to Adolescent Medicine for follow-up in 2 weeks (nurse visit), and a repeat follow-up 2 weeks later (with provider).    For pain:  -- Can schedule Tylenol '500mg'$  every 6 hours  -- Can take Toradol '10mg'$  every 6 hours for breakthrough pain  -- Continue Lyrica '25mg'$  nightly    For nausea:  -- Zofran '4mg'$  every 8 hours as needed  -- Phenergan '25mg'$  twice a day as needed  -- Compazine '5mg'$  every 8 hours as needed  -- Can add scopolamine patch for 72hours    Bowel regimen:  -- Continue Miralax daily ("half or a full cupful with  watermelon slushy")  -- Continue Pericolace 1-2 times per day  -- Continue for at least 6 weeks.  -- Continue to drink at least 2-3 "cupfulls" of liquids each day    For anxiety:  -- Continue Zoloft '50mg'$  daily  -- Continue Zyprexa 2.'5mg'$  at bedtime  -- Continue Ativan '2mg'$  as needed    For ADHD:  -- Continue Intuniv. May discuss if this needs to be stopped outpatient  -- Discontinue Vyvanse    Referral was also sent ot [facility] for RN follow up.        Spoke with: Patient who reports she is feeling well. Reports episodes of mild abdominal pain and nausea, but symptoms controlled with medications.   Patient has medications and all instructions. Patient's mom is a physician and is heling to coordinate follow up and compliance with regime.   ACM called [facility] and verified with Chip that referral was received for [facility] and their office will call patient with [place].    Patient denies any new concerns or needs. Will plan next follow up in about 5 days.     Non-face-to-face services provided:  Communication with home health agencies or other community services the patient is currently using-[facility]. They will call and schedule [facility]    Care Transitions 24 Hour Call    Schedule Follow Up Appointment with PCP: Completed  Do you have a copy of your discharge instructions?: Yes  Do you have all of your prescriptions and are they filled?: Yes  Have you been contacted by a [facility] Pharmacist?: No  Have you scheduled your follow up appointment?: Yes  How are you going to get to your appointment?: Car - family or friend to transport  Do you have support at home?: Parent(s)  Do you feel like you have everything you need to keep you well at home?: Yes  Are you an active caregiver in your home?: No  Care Transitions Interventions     Other Services: Completed (Comment: referred to [facility] for nursing support)     Registered Dietician: Completed             Follow Up  Patient's mother is coordinating follow up appointments.     Berta Minor, LPN

## 2021-01-05 ENCOUNTER — Encounter: Admit: 2021-01-05 | Discharge: 2021-01-05 | Payer: BLUE CROSS/BLUE SHIELD

## 2021-01-07 ENCOUNTER — Encounter: Admit: 2021-01-07 | Discharge: 2021-01-07 | Payer: BLUE CROSS/BLUE SHIELD

## 2021-01-07 MED FILL — GUANFACINE HCL ER 1MG TB24: 1 mg | 90 days supply | Qty: 90 | Fill #2 | Status: AC

## 2021-01-07 MED FILL — LANSOPRAZOLE 30MG CPDR: 30 mg | 90 days supply | Qty: 180 | Fill #2 | Status: AC

## 2021-01-07 MED FILL — SERTRALINE HCL 50MG TABS: 50 mg | 90 days supply | Qty: 135 | Fill #2 | Status: AC

## 2021-01-08 ENCOUNTER — Encounter

## 2021-01-08 NOTE — Care Coordination-Inpatient (Signed)
[place] Transitions Follow Up Call    01/08/2021    Patient: Robin Mendoza  Patient DOB: 18-Feb-2001   MRN: [id number]  Reason for Admission: Malnutrition and Adult FTT  Discharge Date: 01/02/2021 RARS: No data recorded  Care Transitions Follow Up Call    Needs to be reviewed by the provider   Additional needs identified to be addressed with provider: No               Method of communication with provider : none      Ambulatory Care Manager contacted the patient by telephone to follow up after admission on 12/26/2020. Verified name and DOB with patient as identifiers.    Addressed changes since last contact:  ACM spoke with patient and patient's mom Dr. [name], who report patient continues to have nausea and abdominal pain. Trying to eat and stay hydrated. Reports HH made first visit 01/05/2021 and are coordinating IV Fluids at home to prevent hydration. Patient reports she is managing well. Compliant with medications and bowel regime. Scheduled for follow up. Patient's mom is assisting and coordinating care. Dr Wynetta Emery states she feels hopeful about Robin Mendoza and the resources that are in place and feels that the present regime will be successful with time.   Discussed follow-up appointments. Is follow up appointment scheduled within 7 days of discharge? Yes.    Advance Care Planning:   Does patient have an Advance Directive: not on file.     ACM reviewed discharge instructions, medical action plan and red flags with patient and discussed any barriers to care and/or understanding of plan of care after discharge. Discussed appropriate site of care based on symptoms and resources available to patient including: PCP  Specialist  Home health  When to call [place]. The patient agrees to contact the PCP office for questions related to their healthcare.     Patients top risk factors for readmission:  worsening of symptoms or complications  Interventions to address risk factors:  Patient has  scheduled follow up with specialist and PCP, also has [place] working to establish IVF in home to prevent dehydration.       ACM provided contact information for future needs. Plan for follow-up call in 5-7 days based on severity of symptoms and risk factors.  Plan for next call:  symptom management, IVF in home, MD follow up           Follow Up  Future Appointments   Date Time Provider [place]   01/08/2021 To Be Determined Jeanine Luz, MSW [place]   01/10/2021 To Be Determined Juanda Bond, RN [place]   01/14/2021 To Be Determined Juanda Bond, RN [place]   01/17/2021 To Be Determined Juanda Bond, RN [place]   01/21/2021 To Be Determined Juanda Bond, RN [place]   01/24/2021 To Be Determined Juanda Bond, RN [place]   01/29/2021 To Be Determined Juanda Bond, RN [place]   02/05/2021 To Be Determined Juanda Bond, RN [place]   02/12/2021 To Be Determined Juanda Bond, RN [place]   02/19/2021 To Be Determined Juanda Bond, RN [place]   02/26/2021 To Be Determined Juanda Bond, RN [place]   03/05/2021 To Be Determined Juanda Bond, RN [place], LPN

## 2021-01-09 ENCOUNTER — Encounter

## 2021-01-10 ENCOUNTER — Encounter

## 2021-01-11 ENCOUNTER — Encounter: Admit: 2021-01-11 | Discharge: 2021-01-11 | Payer: BLUE CROSS/BLUE SHIELD

## 2021-01-11 ENCOUNTER — Encounter

## 2021-01-14 ENCOUNTER — Encounter

## 2021-01-15 ENCOUNTER — Encounter: Admit: 2021-01-15 | Discharge: 2021-01-15 | Payer: BLUE CROSS/BLUE SHIELD

## 2021-01-16 ENCOUNTER — Inpatient Hospital Stay: Admit: 2021-01-16 | Payer: BLUE CROSS/BLUE SHIELD

## 2021-01-16 ENCOUNTER — Encounter

## 2021-01-16 DIAGNOSIS — R109 Unspecified abdominal pain: Secondary | ICD-10-CM

## 2021-01-16 DIAGNOSIS — R634 Abnormal weight loss: Secondary | ICD-10-CM

## 2021-01-16 LAB — CBC WITH AUTO DIFFERENTIAL
Absolute Eos #: 0.3 10*3/uL (ref 0.0–0.8)
Absolute Immature Granulocyte: 0 10*3/uL (ref 0.0–0.5)
Absolute Lymph #: 2 10*3/uL (ref 0.5–4.6)
Absolute Mono #: 0.5 10*3/uL (ref 0.1–1.3)
Basophils Absolute: 0 10*3/uL (ref 0.0–0.2)
Basophils: 1 % (ref 0.0–2.0)
Eosinophils %: 6 % (ref 0.5–7.8)
Hematocrit: 34.5 % — ABNORMAL LOW (ref 35.8–46.3)
Hemoglobin: 11.2 g/dL — ABNORMAL LOW (ref 11.7–15.4)
Immature Granulocytes: 0 % (ref 0.0–5.0)
Lymphocytes: 33 % (ref 13–44)
MCH: 30.4 PG (ref 26.1–32.9)
MCHC: 32.5 g/dL (ref 31.4–35.0)
MCV: 93.5 FL (ref 79.6–97.8)
MPV: 10.8 FL (ref 9.4–12.3)
Monocytes: 8 % (ref 4.0–12.0)
Platelets: 282 10*3/uL (ref 150–450)
RBC: 3.69 M/uL — ABNORMAL LOW (ref 4.05–5.2)
RDW: 12.7 % (ref 11.9–14.6)
Seg Neutrophils: 52 % (ref 43–78)
Segs Absolute: 3.1 10*3/uL (ref 1.7–8.2)
WBC: 5.9 10*3/uL (ref 4.3–11.1)
nRBC: 0 10*3/uL (ref 0.0–0.2)

## 2021-01-16 LAB — COMPREHENSIVE METABOLIC PANEL
ALT: 22 U/L (ref 12–65)
AST: 20 U/L (ref 15–37)
Albumin/Globulin Ratio: 1 — ABNORMAL LOW (ref 1.2–3.5)
Albumin: 3.1 g/dL — ABNORMAL LOW (ref 3.5–5.0)
Alk Phosphatase: 86 U/L (ref 50–136)
Anion Gap: 7 mmol/L (ref 4–13)
BUN: 5 MG/DL — ABNORMAL LOW (ref 6–23)
CO2: 25 mmol/L (ref 21–32)
Calcium: 9.1 MG/DL (ref 8.3–10.4)
Chloride: 111 mmol/L — ABNORMAL HIGH (ref 101–110)
Creatinine: 0.7 MG/DL (ref 0.6–1.0)
GFR African American: >60 mL/min/{1.73_m2} (ref >60)
GFR Non-African American: >60 mL/min/{1.73_m2} (ref >60)
Globulin: 3 g/dL (ref 2.3–3.5)
Glucose: 99 mg/dL (ref 65–100)
Potassium: 3.8 mmol/L (ref 3.5–5.1)
Sodium: 143 mmol/L (ref 136–145)
Total Bilirubin: 0.2 MG/DL (ref 0.2–1.1)
Total Protein: 6.1 g/dL — ABNORMAL LOW (ref 6.3–8.2)

## 2021-01-16 LAB — MAGNESIUM: Magnesium: 1.9 mg/dL (ref 1.8–2.4)

## 2021-01-16 LAB — PHOSPHORUS: Phosphorus: 3.4 MG/DL (ref 2.5–4.5)

## 2021-01-16 NOTE — Care Coordination-Inpatient (Signed)
[place] Transitions Follow Up Call    01/16/2021    Patient: Robin Mendoza  Patient DOB: 01/25/2001   MRN: [id number]  Reason for Admission: Adult FTTwith nausea and abdominal pain  Discharge Date: 10/22/20 RARS: No data recorded    Care Transitions Follow Up Call    Needs to be reviewed by the provider   Additional needs identified to be addressed with provider: No               Method of communication with provider : none      Ambulatory Care Manager contacted the patient by telephone to follow up after admission on 12/26/20 to [facility]. Verified name and DOB with patient as identifiers.    Addressed changes since last contact:  Patient reports no real change in symtoms, but able to manage better. Receiving IVF almost nightly to help prevent dehydration. Tolerating small amounts of PO. Taking medications as directed. HH continues to see patient and patient's mom is a physician.  All neccessary resources in place. Patient denies any new concerns or questions. Scheduled for follow up.  Advance Care Planning:   Does patient have an Advance Directive: not on file.     ACM reviewed discharge instructions, medical action plan and red flags with patient and discussed any barriers to care and/or understanding of plan of care after discharge. Discussed appropriate site of care based on symptoms and resources available to patient including: PCP  Specialist  Home health  When to call [place]. The patient agrees to contact the PCP office for questions related to their healthcare.     Patients top risk factors for readmission:  worsening of symptoms or complications from Dx  Interventions to address risk factors: Education of patient/family/caregiver/guardian to support self-management-reviewed importance of close follow up with physicians, compliance with medications and bowel regime and reporting new symptoms or concerns.    ACM provided contact information for future needs. Plan for follow-up  call in 10-14 days based on severity of symptoms and risk factors.  Plan for next call: symptom management-any new concerns or complications          Follow Up  Future Appointments   Date Time Provider Department Center   01/18/2021 To Be Determined Juanda Bond, RN [place]   01/21/2021 To Be Determined Juanda Bond, RN [place]   01/24/2021 To Be Determined Juanda Bond, RN [place]   01/29/2021 To Be Determined Juanda Bond, RN [place]   02/05/2021 To Be Determined Juanda Bond, RN [place]   02/12/2021 To Be Determined Juanda Bond, RN [place]   02/19/2021 To Be Determined Juanda Bond, RN [place]   02/26/2021 To Be Determined Juanda Bond, RN [place]   03/05/2021 To Be Determined Juanda Bond, RN [place], LPN

## 2021-01-17 ENCOUNTER — Encounter

## 2021-01-17 LAB — HEMOGLOBIN A1C
Hemoglobin A1C: 5.1 % (ref 4.8–5.6)
eAG: 100 mg/dL

## 2021-01-18 ENCOUNTER — Encounter

## 2021-01-18 ENCOUNTER — Encounter: Admit: 2021-01-18 | Discharge: 2021-01-18 | Payer: BLUE CROSS/BLUE SHIELD

## 2021-01-21 ENCOUNTER — Encounter

## 2021-01-22 ENCOUNTER — Encounter: Admit: 2021-01-22 | Discharge: 2021-01-22 | Payer: BLUE CROSS/BLUE SHIELD

## 2021-01-24 ENCOUNTER — Encounter

## 2021-01-25 ENCOUNTER — Encounter

## 2021-01-25 ENCOUNTER — Encounter: Admit: 2021-01-25 | Discharge: 2021-01-25 | Payer: BLUE CROSS/BLUE SHIELD

## 2021-01-26 ENCOUNTER — Emergency Department: Admit: 2021-01-26 | Payer: BLUE CROSS/BLUE SHIELD

## 2021-01-26 ENCOUNTER — Inpatient Hospital Stay
Admission: EM | Admit: 2021-01-26 | Discharge: 2021-01-31 | Disposition: A | Discharge status: Home Health | Payer: BLUE CROSS/BLUE SHIELD | Admitting: Internal Medicine

## 2021-01-26 DIAGNOSIS — A419 Sepsis, unspecified organism: Principal | ICD-10-CM

## 2021-01-26 LAB — POCT URINALYSIS DIPSTICK
Bilirubin, Urine, POC: NEGATIVE
Blood, UA POC: NEGATIVE
Glucose, UA POC: NEGATIVE mg/dL
Ketones, Urine, POC: NEGATIVE mg/dL
Leukocyte Est, UA POC: NEGATIVE
Nitrite, Urine, POC: NEGATIVE
Protein, Urine, POC: NEGATIVE mg/dL
Specific Gravity, Urine, POC: <=1.005 (ref 1.001–1.023)
URINE UROBILINOGEN POC: 0.2 EU/dL (ref 0.2–1.0)
pH, Urine, POC: 5.5 (ref 5.0–9.0)

## 2021-01-26 LAB — BASIC METABOLIC PANEL
Anion Gap: 7 mmol/L (ref 4–13)
BUN: 12 MG/DL (ref 6–23)
CO2: 24 mmol/L (ref 21–32)
Calcium: 8.2 MG/DL — ABNORMAL LOW (ref 8.3–10.4)
Chloride: 109 mmol/L (ref 101–110)
Creatinine: 0.7 MG/DL (ref 0.6–1.0)
GFR African American: >60 mL/min/{1.73_m2} (ref >60)
GFR Non-African American: >60 mL/min/{1.73_m2} (ref >60)
Glucose: 94 mg/dL (ref 65–100)
Potassium: 3.5 mmol/L (ref 3.5–5.1)
Sodium: 140 mmol/L (ref 136–145)

## 2021-01-26 LAB — COMPREHENSIVE METABOLIC PANEL
ALT: 56 U/L (ref 12–65)
AST: 110 U/L — ABNORMAL HIGH (ref 15–37)
Albumin/Globulin Ratio: 0.9 — ABNORMAL LOW (ref 1.2–3.5)
Albumin: 3.2 g/dL — ABNORMAL LOW (ref 3.5–5.0)
Alk Phosphatase: 87 U/L (ref 50–136)
Anion Gap: 5 mmol/L (ref 4–13)
BUN: 10 MG/DL (ref 6–23)
CO2: 23 mmol/L (ref 21–32)
Calcium: 9 MG/DL (ref 8.3–10.4)
Chloride: 109 mmol/L (ref 101–110)
Creatinine: 0.9 MG/DL (ref 0.6–1.0)
GFR African American: >60 mL/min/{1.73_m2} (ref >60)
GFR Non-African American: >60 mL/min/{1.73_m2} (ref >60)
Globulin: 3.5 g/dL (ref 2.3–3.5)
Glucose: 116 mg/dL — ABNORMAL HIGH (ref 65–100)
Potassium: 3 mmol/L — ABNORMAL LOW (ref 3.5–5.1)
Sodium: 137 mmol/L (ref 136–145)
Total Bilirubin: 0.3 MG/DL (ref 0.2–1.1)
Total Protein: 6.7 g/dL (ref 6.3–8.2)

## 2021-01-26 LAB — RAPID INFLUENZA A/B ANTIGENS
Influenza A Ag: NEGATIVE
Influenza B Ag: NEGATIVE

## 2021-01-26 LAB — EKG 12-LEAD
Atrial Rate: 162 {beats}/min
P Axis: 80 degrees
P-R Interval: 112 ms
Q-T Interval: 296 ms
QRS Duration: 66 ms
QTc Calculation (Bazett): 485 ms
R Axis: 78 degrees
T Axis: 72 degrees
Ventricular Rate: 162 {beats}/min

## 2021-01-26 LAB — LACTIC ACID
Lactic Acid, Plasma: 4.5 MMOL/L (ref 0.4–2.0)
Lactic Acid, Plasma: 4 MMOL/L (ref 0.4–2.0)
Lactic Acid, Plasma: 3.9 MMOL/L (ref 0.4–2.0)
Lactic Acid, Plasma: 2.9 MMOL/L — ABNORMAL HIGH (ref 0.4–2.0)

## 2021-01-26 LAB — CBC WITH AUTO DIFFERENTIAL
Absolute Eos #: 0 10*3/uL (ref 0.0–0.8)
Absolute Immature Granulocyte: 0 10*3/uL (ref 0.0–0.5)
Absolute Lymph #: 0.2 10*3/uL — ABNORMAL LOW (ref 0.5–4.6)
Absolute Mono #: 0 10*3/uL — ABNORMAL LOW (ref 0.1–1.3)
Basophils Absolute: 0 10*3/uL (ref 0.0–0.2)
Basophils: 0 % (ref 0.0–2.0)
Eosinophils %: 0 % — ABNORMAL LOW (ref 0.5–7.8)
Hematocrit: 38.6 % (ref 35.8–46.3)
Hemoglobin: 12.7 g/dL (ref 11.7–15.4)
Immature Granulocytes: 0 % (ref 0.0–5.0)
Lymphocytes: 2 % — ABNORMAL LOW (ref 13–44)
MCH: 30.2 PG (ref 26.1–32.9)
MCHC: 32.9 g/dL (ref 31.4–35.0)
MCV: 91.9 FL (ref 79.6–97.8)
MPV: 10.4 FL (ref 9.4–12.3)
Monocytes: 0 % — ABNORMAL LOW (ref 4.0–12.0)
Platelets: 230 10*3/uL (ref 150–450)
RBC: 4.2 M/uL (ref 4.05–5.2)
RDW: 12.6 % (ref 11.9–14.6)
Seg Neutrophils: 98 % — ABNORMAL HIGH (ref 43–78)
Segs Absolute: 11.5 10*3/uL — ABNORMAL HIGH (ref 1.7–8.2)
WBC: 11.8 10*3/uL — ABNORMAL HIGH (ref 4.3–11.1)
nRBC: 0 10*3/uL (ref 0.0–0.2)

## 2021-01-26 LAB — PHOSPHORUS
Phosphorus: 1.4 MG/DL — ABNORMAL LOW (ref 2.5–4.5)
Phosphorus: 2 MG/DL — ABNORMAL LOW (ref 2.5–4.5)

## 2021-01-26 LAB — COVID-19, RAPID: SARS-CoV-2, Rapid: NOT DETECTED

## 2021-01-26 LAB — PROCALCITONIN: Procalcitonin: 1.1 ng/mL — ABNORMAL HIGH (ref 0.00–0.49)

## 2021-01-26 LAB — MAGNESIUM
Magnesium: 1.5 mg/dL — ABNORMAL LOW (ref 1.8–2.4)
Magnesium: 2 mg/dL (ref 1.8–2.4)

## 2021-01-26 MED ORDER — HYDROMORPHONE HCL PF 1 MG/ML IJ SOLN
1 MG/ML | INTRAMUSCULAR | Status: DC | PRN
Start: 2021-01-26 — End: 2021-01-26
  Administered 2021-01-26: 18:00:00 0.25 mg via INTRAVENOUS

## 2021-01-26 MED ORDER — VIAL2BAG ADAPTOR (20 MM)
1 g | Freq: Once | INTRAVENOUS | Status: AC
Start: 2021-01-26 — End: 2021-01-26
  Administered 2021-01-26: 18:00:00 1000 mg via INTRAVENOUS

## 2021-01-26 MED ORDER — POTASSIUM CHLORIDE 10 MEQ/100ML IV SOLN
10 MEQ/0ML | INTRAVENOUS | Status: AC
Start: 2021-01-26 — End: 2021-01-26
  Administered 2021-01-26: 12:00:00 10 meq via INTRAVENOUS

## 2021-01-26 MED ORDER — SODIUM CHLORIDE 0.9 % IV BOLUS
0.9 % | Freq: Once | INTRAVENOUS | Status: DC | PRN
Start: 2021-01-26 — End: 2021-01-31

## 2021-01-26 MED ORDER — PIPERACILLIN SOD-TAZOBACTAM SO 3.375 (3-0.375) G IV SOLR
3.3753-0.375 (3-0.375) g | Freq: Three times a day (TID) | INTRAVENOUS | Status: DC
Start: 2021-01-26 — End: 2021-01-29
  Administered 2021-01-26 – 2021-01-29 (×10): 3375 mg via INTRAVENOUS

## 2021-01-26 MED ORDER — PIPERACILLIN SOD-TAZOBACTAM SO 3.375 (3-0.375) G IV SOLR
3.375 (3-0.375) g | INTRAVENOUS | Status: DC
Start: 2021-01-26 — End: 2021-01-26

## 2021-01-26 MED ORDER — GUANFACINE HCL ER 4 MG PO TB24
4 MG | Freq: Every day | ORAL | Status: DC
Start: 2021-01-26 — End: 2021-01-27

## 2021-01-26 MED ORDER — ENOXAPARIN SODIUM 40 MG/0.4ML IJ SOSY
40 MG/0.4ML | Freq: Every day | INTRAMUSCULAR | Status: DC
Start: 2021-01-26 — End: 2021-01-27

## 2021-01-26 MED ORDER — IOPAMIDOL 76 % IV SOLN
76 % | Freq: Once | INTRAVENOUS | Status: AC | PRN
Start: 2021-01-26 — End: 2021-01-26
  Administered 2021-01-26: 12:00:00 100 mL via INTRAVENOUS

## 2021-01-26 MED ORDER — PROMETHAZINE HCL 25 MG/ML IJ SOLN
25 MG/ML | Freq: Once | INTRAMUSCULAR | Status: AC
Start: 2021-01-26 — End: 2021-01-26
  Administered 2021-01-26: 11:00:00 25 mg via INTRAMUSCULAR

## 2021-01-26 MED ORDER — SODIUM CHLORIDE 0.9 % IV SOLN
0.9 % | INTRAVENOUS | Status: DC | PRN
Start: 2021-01-26 — End: 2021-01-31

## 2021-01-26 MED ORDER — NORMAL SALINE FLUSH 0.9 % IV SOLN
0.9 % | Freq: Two times a day (BID) | INTRAVENOUS | Status: DC
Start: 2021-01-26 — End: 2021-01-31
  Administered 2021-01-27 – 2021-01-31 (×10): 10 mL via INTRAVENOUS

## 2021-01-26 MED ORDER — PRUCALOPRIDE SUCCINATE 2 MG PO TABS
2 MG | Freq: Every day | ORAL | Status: DC
Start: 2021-01-26 — End: 2021-01-27

## 2021-01-26 MED ORDER — LORAZEPAM 2 MG/ML IJ SOLN
2 MG/ML | Freq: Once | INTRAMUSCULAR | Status: AC
Start: 2021-01-26 — End: 2021-01-26
  Administered 2021-01-26: 10:00:00 1 mg via INTRAVENOUS

## 2021-01-26 MED ORDER — LACTATED RINGERS IV BOLUS
INTRAVENOUS | Status: AC
Start: 2021-01-26 — End: 2021-01-26
  Administered 2021-01-26: 14:00:00 1000 mL via INTRAVENOUS

## 2021-01-26 MED ORDER — FAMOTIDINE (PF) 20 MG/2ML IV SOLN
202 MG/2ML | Freq: Two times a day (BID) | INTRAVENOUS | Status: DC
Start: 2021-01-26 — End: 2021-01-27
  Administered 2021-01-26 – 2021-01-27 (×2): 20 mg via INTRAVENOUS

## 2021-01-26 MED ORDER — LORAZEPAM 2 MG/ML IJ SOLN
2 MG/ML | Freq: Four times a day (QID) | INTRAMUSCULAR | Status: DC | PRN
Start: 2021-01-26 — End: 2021-01-27
  Administered 2021-01-26 – 2021-01-27 (×2): 0.5 mg via INTRAVENOUS

## 2021-01-26 MED ORDER — ONDANSETRON 4 MG PO TBDP
4 MG | Freq: Three times a day (TID) | ORAL | Status: DC | PRN
Start: 2021-01-26 — End: 2021-01-30
  Administered 2021-01-29: 20:00:00 4 mg via ORAL

## 2021-01-26 MED ORDER — ACETAMINOPHEN 325 MG PO TABS
325 MG | Freq: Four times a day (QID) | ORAL | Status: DC | PRN
Start: 2021-01-26 — End: 2021-01-31
  Administered 2021-01-27 – 2021-01-31 (×7): 650 mg via ORAL

## 2021-01-26 MED ORDER — SODIUM CHLORIDE 0.9 % IV SOLN
0.9 % | Freq: Two times a day (BID) | INTRAVENOUS | Status: DC
Start: 2021-01-26 — End: 2021-01-28
  Administered 2021-01-27 – 2021-01-28 (×3): 750 mg via INTRAVENOUS

## 2021-01-26 MED ORDER — ACETAMINOPHEN 650 MG RE SUPP
650 MG | Freq: Four times a day (QID) | RECTAL | Status: DC | PRN
Start: 2021-01-26 — End: 2021-01-31

## 2021-01-26 MED ORDER — KETOROLAC TROMETHAMINE 30 MG/ML IJ SOLN
30 MG/ML | Freq: Once | INTRAMUSCULAR | Status: AC
Start: 2021-01-26 — End: 2021-01-26
  Administered 2021-01-26: 10:00:00 15 mg via INTRAVENOUS

## 2021-01-26 MED ORDER — POTASSIUM PHOSPHATE 3 MMOLE/ML IV SOLN (MIXTURES ONLY)
15 MMOLE/5ML | Freq: Once | INTRAVENOUS | Status: AC
Start: 2021-01-26 — End: 2021-01-26
  Administered 2021-01-26: 20:00:00 20 mmol via INTRAVENOUS

## 2021-01-26 MED ORDER — OLANZAPINE 5 MG PO TABS
5 MG | Freq: Every evening | ORAL | Status: DC
Start: 2021-01-26 — End: 2021-01-27
  Administered 2021-01-27: 01:00:00 5 mg via ORAL

## 2021-01-26 MED ORDER — SERTRALINE HCL 25 MG PO TABS
25 MG | Freq: Every day | ORAL | Status: DC
Start: 2021-01-26 — End: 2021-01-26

## 2021-01-26 MED ORDER — PROMETHAZINE HCL 12.5 MG PO TABS
12.5 MG | Freq: Four times a day (QID) | ORAL | Status: DC | PRN
Start: 2021-01-26 — End: 2021-01-26

## 2021-01-26 MED ORDER — LACTATED RINGERS IV BOLUS
Freq: Once | INTRAVENOUS | Status: DC
Start: 2021-01-26 — End: 2021-01-26
  Administered 2021-01-26: 09:00:00 1710 mL/kg via INTRAVENOUS

## 2021-01-26 MED ORDER — SODIUM CHLORIDE 0.9 % IV SOLN
0.9 % | INTRAVENOUS | Status: DC
Start: 2021-01-26 — End: 2021-01-27
  Administered 2021-01-26: 18:00:00 via INTRAVENOUS

## 2021-01-26 MED ORDER — SODIUM CHLORIDE 0.9 % IV SOLN (MINI-BAG)
0.9 % | INTRAVENOUS | Status: AC
Start: 2021-01-26 — End: 2021-01-26
  Administered 2021-01-26: 10:00:00 4500 mg via INTRAVENOUS

## 2021-01-26 MED ORDER — PROCHLORPERAZINE EDISYLATE 10 MG/2ML IJ SOLN
10 MG/2ML | Freq: Four times a day (QID) | INTRAMUSCULAR | Status: DC | PRN
Start: 2021-01-26 — End: 2021-01-27
  Administered 2021-01-26 – 2021-01-27 (×2): 10 mg via INTRAVENOUS

## 2021-01-26 MED ORDER — NORMAL SALINE FLUSH 0.9 % IV SOLN
0.9 % | Freq: Once | INTRAVENOUS | Status: DC | PRN
Start: 2021-01-26 — End: 2021-01-31

## 2021-01-26 MED ORDER — NORMAL SALINE FLUSH 0.9 % IV SOLN
0.9 % | INTRAVENOUS | Status: DC | PRN
Start: 2021-01-26 — End: 2021-01-31
  Administered 2021-01-28: 12:00:00 10 mL via INTRAVENOUS

## 2021-01-26 MED ORDER — DIATRIZOATE MEGLUMINE & SODIUM 66-10 % PO SOLN
66-10 % | ORAL | Status: AC
Start: 2021-01-26 — End: 2021-01-26
  Administered 2021-01-26: 11:00:00 15 mL via ORAL

## 2021-01-26 MED ORDER — MAGNESIUM SULFATE 2000 MG/50 ML IVPB PREMIX
250 GM/50ML | Freq: Once | INTRAVENOUS | Status: AC
Start: 2021-01-26 — End: 2021-01-26
  Administered 2021-01-26: 14:00:00 2000 mg via INTRAVENOUS

## 2021-01-26 MED ORDER — SERTRALINE HCL 25 MG PO TABS
25 MG | Freq: Every day | ORAL | Status: DC
Start: 2021-01-26 — End: 2021-01-27

## 2021-01-26 MED ORDER — ONDANSETRON HCL 4 MG/2ML IJ SOLN
4 MG/2ML | Freq: Four times a day (QID) | INTRAMUSCULAR | Status: DC | PRN
Start: 2021-01-26 — End: 2021-01-30
  Administered 2021-01-26 – 2021-01-30 (×7): 4 mg via INTRAVENOUS

## 2021-01-26 MED ORDER — HYDROMORPHONE HCL PF 1 MG/ML IJ SOLN
1 MG/ML | Freq: Four times a day (QID) | INTRAMUSCULAR | Status: DC | PRN
Start: 2021-01-26 — End: 2021-01-27
  Administered 2021-01-27: 02:00:00 0.25 mg via INTRAVENOUS

## 2021-01-26 MED FILL — VANCOMYCIN HCL 1 G IV SOLR: 1 g | INTRAVENOUS | Qty: 1000

## 2021-01-26 MED FILL — ONDANSETRON HCL 4 MG/2ML IJ SOLN: 4 MG/2ML | INTRAMUSCULAR | Qty: 2

## 2021-01-26 MED FILL — POTASSIUM CHLORIDE 10 MEQ/100ML IV SOLN: 10 MEQ/0ML | INTRAVENOUS | Qty: 100

## 2021-01-26 MED FILL — KETOROLAC TROMETHAMINE 30 MG/ML IJ SOLN: 30 MG/ML | INTRAMUSCULAR | Qty: 1

## 2021-01-26 MED FILL — PROCHLORPERAZINE EDISYLATE 10 MG/2ML IJ SOLN: 10 MG/2ML | INTRAMUSCULAR | Qty: 2

## 2021-01-26 MED FILL — PROMETHAZINE HCL 12.5 MG PO TABS: 12.5 MG | ORAL | Qty: 1

## 2021-01-26 MED FILL — POTASSIUM PHOSPHATES 45 MMOLE/15ML IV SOLN: 45 MMOLE/15ML | INTRAVENOUS | Qty: 6.67

## 2021-01-26 MED FILL — PIPERACILLIN SOD-TAZOBACTAM SO 4.5 (4-0.5) G IV SOLR: 4.5 (4-0.5) g | INTRAVENOUS | Qty: 4500

## 2021-01-26 MED FILL — ACETAMINOPHEN 650 MG RE SUPP: 650 MG | RECTAL | Qty: 1

## 2021-01-26 MED FILL — MAGNESIUM SULFATE 2 GM/50ML IV SOLN: 2 GM/50ML | INTRAVENOUS | Qty: 50

## 2021-01-26 MED FILL — FAMOTIDINE (PF) 20 MG/2ML IV SOLN: 20 MG/2ML | INTRAVENOUS | Qty: 2

## 2021-01-26 MED FILL — LORAZEPAM 2 MG/ML IJ SOLN: 2 MG/ML | INTRAMUSCULAR | Qty: 1

## 2021-01-26 MED FILL — MD-GASTROVIEW 66-10 % PO SOLN: 66-10 % | ORAL | Qty: 30

## 2021-01-26 MED FILL — PROMETHAZINE HCL 25 MG/ML IJ SOLN: 25 MG/ML | INTRAMUSCULAR | Qty: 1

## 2021-01-26 MED FILL — OLANZAPINE 5 MG PO TABS: 5 MG | ORAL | Qty: 1

## 2021-01-26 MED FILL — HYDROMORPHONE HCL 1 MG/ML IJ SOLN: 1 MG/ML | INTRAMUSCULAR | Qty: 1

## 2021-01-26 MED FILL — PIPERACILLIN SOD-TAZOBACTAM SO 3.375 (3-0.375) G IV SOLR: 3.375 (3-0.375) g | INTRAVENOUS | Qty: 3375

## 2021-01-26 NOTE — ED Notes (Signed)
Bedside report from [facility], transfer of care at this time. Patient mother at bedside, assist with patient care and delivery of gastroview. Patient easily agitated, monitor leads and BP cuff not attached at this time.      Elnoria Howard, RN  01/26/21 [phone]

## 2021-01-26 NOTE — Progress Notes (Signed)
VANCO DAILY FOLLOW UP NOTE  [place] Pharmacokinetic Monitoring Service - Vancomycin    Consulting Provider: Dr. Laureen Ochs   Indication: Sepsis  Target Concentration: Goal AUC/MIC 400-600 mg*hr/L  Day of Therapy: 1  Additional Antimicrobials: Pip-Tazo    Pertinent Laboratory Values:   Wt Readings from Last 1 Encounters:   01/26/21 114 lb (51.7 kg)     Temp Readings from Last 1 Encounters:   01/26/21 98.8 ??F (37.1 ??C) (Oral)     Recent Labs     01/26/21  0502 01/26/21  0615 01/26/21  1005   BUN 10  --   --    CREATININE 0.90  --   --    WBC 11.8*  --   --    PROCAL 1.10*  --   --    LACACIDPL 4.5* 4.0* 3.9*     Estimated Creatinine Clearance: 80 mL/min (based on SCr of 0.9 mg/dL).    No results found for: Pandora Leiter    MRSA Nasal Swab: N/A. Non-respiratory infection.      Assessment:  Date/Time Dose Concentration AUC         Note: Serum concentrations collected for AUC dosing may appear elevated if collected in close proximity to the dose administered, this is not necessarily an indication of toxicity    Plan:  Dosing recommendations based on Bayesian software  Start vancomycin 1000 mg X 1, then 750 mg Q12H  Anticipated AUC of 454 and trough concentration of 13.7 at steady state  Renal labs as indicated   Vancomycin concentrations will be ordered as clinically appropriate   Pharmacy will continue to monitor patient and adjust therapy as indicated    Thank you for the consult,  Rodman Key L. [place], [facility]

## 2021-01-26 NOTE — Consults (Signed)
[facility] Consult Note       Primary GI Physician: Dot Been or Dorris Fetch    Referring Provider:  Rosana Hoes    Consult Date:  01/26/2021    Admit Date:  01/26/2021    Chief Complaint:  Abdominal pain with N/V    Subjective:     History of Present Illness:  Patient is a 20 y.o. female with PMH including but not limited to below, who is seen in consultation at the request of Dr. Rosana Hoes for abdominal pain with N/V.  She has a history of months of RUQ pain that has defied evaluation.  There has been associated N/V with decrease eating, leading to weight loss with electrolyte dyscrasias.  She has been evaluated by [facility]. Kendall Fl[facility], Dr. Dot Been with [facility], and Dr. Terrace Arabia with [facility].  The most recent evaluation was at [facility], where there is a concern over gastroparesis and visceral hypersensitivity.  Recommendations were made to increase Zyprexa to 5 mg QHS, start Motegrity 2 mg daily, stop other laxatives, and obtain solid-phase GES with UGI series if symptoms persist.  Because of lack of insurance coverage, the Motegrity was only started yesterday after her family paid for this out-of-pocket.  She has continued to have RUQ pain that is constant and may worsen with food.  She did increase Zyprexa as recommended.  Now, she has developed high fever of unclear source.    PMH:  Past Medical History:   Diagnosis Date    ADHD     managed with meds    Autism     Depression     GERD (gastroesophageal reflux disease)     Weight loss        PSH:  Past Surgical History:   Procedure Laterality Date    CHOLECYSTECTOMY, LAPAROSCOPIC N/A 10/05/2020    CHOLECYSTECTOMY LAPAROSCOPIC performed by Azalia Bilis, MD at [facility] MAIN OR    COLONOSCOPY N/A 09/17/2020    COLONOSCOPY/BMI 21 performed by Flint Melter, MD at [facility] ENDOSCOPY    COLONOSCOPY N/A 01/14/2019    COLONOSCOPY/  BMI 21 PT IS AUTISTIC performed by Flint Melter, MD at [place] Bilateral     as a baby     LAPAROSCOPIC APPENDECTOMY N/A 10/05/2020    APPENDECTOMY LAPAROSCOPIC performed by Azalia Bilis, MD at [facility] MAIN OR    UPPER GASTROINTESTINAL ENDOSCOPY         Allergies:  Allergies   Allergen Reactions    Cow's Milk Anaphylaxis and Hives    Food Anaphylaxis    Lac Bovis Anaphylaxis    Albumen, Egg Other (See Comments)     No longer allergic      Dog Epithelium Allergy Skin Test Itching    Other Itching       Home Medications:  Prior to Admission medications    Medication Sig Start Date End Date Taking? Authorizing Provider   guanFACINE (INTUNIV) 1 MG TB24 extended release tablet Take 1 mg by mouth daily.    Historical Provider, MD   guanFACINE (INTUNIV) 4 MG TB24 extended release tablet Take 4 mg by mouth daily.    Historical Provider, MD   Ketorolac Tromethamine (TORADOL ORAL PO) Take 10 mg by mouth every 6 hours as needed (pain).  Patient not taking: No sig reported    Historical Provider, MD   OLANZapine (ZYPREXA) 2.5 MG tablet Take 5 mg by mouth nightly    Historical Provider, MD  prochlorperazine (COMPAZINE) 10 MG tablet Take 5 mg by mouth every 6 hours as needed (nausea).    Historical Provider, MD   acetaminophen (TYLENOL) 500 MG tablet Take 500 mg by mouth every 6 hours as needed for Pain (breakthrough pain).    Historical Provider, MD   EPINEPHrine 0.3 MG/0.3ML SOSY Inject 0.3 mg into the muscle 1 (one) time if needed (allergic reaction).    Historical Provider, MD   promethazine (PHENERGAN) 25 MG tablet Take 25 mg by mouth every 6 hours as needed for Nausea    Historical Provider, MD   ondansetron (ZOFRAN-ODT) 8 MG TBDP disintegrating tablet Place 1 tablet under the tongue every 8 hours as needed for Nausea or Vomiting 10/05/20   Azalia Bilis, MD   VYVANSE 60 MG CHEW  09/12/20   Historical Provider, MD   guanFACINE (INTUNIV) 1 MG TB24 extended release tablet Take 1 mg by mouth daily  07/11/20   Historical Provider, MD   Sertraline HCl (ZOLOFT PO) Take 75 mg by mouth daily    Historical Provider, MD    Famotidine (PEPCID PO) Take 10 mg by mouth daily    Historical Provider, MD   levonorgestrel (MIRENA, 52 MG,) IUD 52 mg 1 each by IntraUTERine route once    Historical Provider, MD       Hospital Medications:  Current Facility-Administered Medications   Medication Dose Route Frequency    0.9 % sodium chloride bolus  100 mL IntraVENous ONCE PRN    sodium chloride flush 0.9 % injection 10 mL  10 mL IntraVENous ONCE PRN    famotidine (PEPCID) 20 mg in sodium chloride (PF) 10 mL injection  20 mg IntraVENous Q12H    OLANZapine (ZYPREXA) tablet 5 mg  5 mg Oral Nightly    sodium chloride flush 0.9 % injection 5-40 mL  5-40 mL IntraVENous 2 times per day    sodium chloride flush 0.9 % injection 5-40 mL  5-40 mL IntraVENous PRN    0.9 % sodium chloride infusion   IntraVENous PRN    [START ON 01/27/2021] enoxaparin (LOVENOX) injection 40 mg  40 mg SubCUTAneous Daily    ondansetron (ZOFRAN-ODT) disintegrating tablet 4 mg  4 mg Oral Q8H PRN    Or    ondansetron (ZOFRAN) injection 4 mg  4 mg IntraVENous Q6H PRN    acetaminophen (TYLENOL) tablet 650 mg  650 mg Oral Q6H PRN    Or    acetaminophen (TYLENOL) suppository 650 mg  650 mg Rectal Q6H PRN    HYDROmorphone HCl PF (DILAUDID) injection 0.25 mg  0.25 mg IntraVENous Q4H PRN    0.9 % sodium chloride infusion   IntraVENous Continuous    piperacillin-tazobactam (ZOSYN) 3,375 mg in sodium chloride 0.9 % 50 mL IVPB (mini-bag)  3,375 mg IntraVENous q8h    vancomycin (VANCOCIN) 1,000 mg in sodium chloride 0.9 % 250 mL IVPB (Vial2Bag)  1,000 mg IntraVENous Once    [START ON 01/27/2021] vancomycin (VANCOCIN) 750 mg in sodium chloride 0.9 % 250 mL IVPB (Vial2Bag)  750 mg IntraVENous Q12H    potassium phosphate 20 mmol in sodium chloride 0.9 % 250 mL IVPB  20 mmol IntraVENous Once    LORazepam (ATIVAN) injection 0.5 mg  0.5 mg IntraVENous Q6H PRN    [START ON 01/27/2021] sertraline (ZOLOFT) tablet 50 mg  50 mg Oral Daily    Prucalopride Succinate TABS 2 mg (Patient Supplied)  2 mg Oral  Daily    promethazine (PHENERGAN) tablet 12.5 mg  12.5 mg Oral Q6H PRN     Current Outpatient Medications   Medication Sig    guanFACINE (INTUNIV) 1 MG TB24 extended release tablet Take 1 mg by mouth daily.    guanFACINE (INTUNIV) 4 MG TB24 extended release tablet Take 4 mg by mouth daily.    Ketorolac Tromethamine (TORADOL ORAL PO) Take 10 mg by mouth every 6 hours as needed (pain). (Patient not taking: No sig reported)    OLANZapine (ZYPREXA) 2.5 MG tablet Take 5 mg by mouth nightly    prochlorperazine (COMPAZINE) 10 MG tablet Take 5 mg by mouth every 6 hours as needed (nausea).    acetaminophen (TYLENOL) 500 MG tablet Take 500 mg by mouth every 6 hours as needed for Pain (breakthrough pain).    EPINEPHrine 0.3 MG/0.3ML SOSY Inject 0.3 mg into the muscle 1 (one) time if needed (allergic reaction).    promethazine (PHENERGAN) 25 MG tablet Take 25 mg by mouth every 6 hours as needed for Nausea    ondansetron (ZOFRAN-ODT) 8 MG TBDP disintegrating tablet Place 1 tablet under the tongue every 8 hours as needed for Nausea or Vomiting    VYVANSE 60 MG CHEW  (Patient not taking: Reported on 01/03/2021)    guanFACINE (INTUNIV) 1 MG TB24 extended release tablet Take 1 mg by mouth daily     Sertraline HCl (ZOLOFT PO) Take 75 mg by mouth daily    Famotidine (PEPCID PO) Take 10 mg by mouth daily    levonorgestrel (MIRENA, 52 MG,) IUD 52 mg 1 each by IntraUTERine route once       Social History:  Social History     Tobacco Use    Smoking status: Never    Smokeless tobacco: Never   Substance Use Topics    Alcohol use: Not Currently       Pt denies any history of drug use, blood transfusions, or tattoos.    Family History:  Family History   Problem Relation Age of Onset    Cancer Mother         bilateral breast cancer    No Known Problems Father     Hypertension Mother        Review of Systems:  A detailed 10 system ROS is obtained, with pertinent positives as listed above.  All others are negative.    Objective:     Physical  Exam:  Vitals:  BP (!) 105/46    Pulse (!) 121    Temp 98.8 ??F (37.1 ??C) (Oral)    Resp 16    Ht '5\' 5"'$  (1.651 m)    Wt 114 lb (51.7 kg)    SpO2 97%    BMI 18.97 kg/m??   Gen:  Pt is alert, cooperative, no acute distress  Skin:  Extremities and face reveal no rashes.   HEENT: Sclerae anicteric.  Extra-occular muscles are intact.  No oral ulcers.  No abnormal pigmentation of the lips.  The neck is supple.  Cardiovascular: Tachy, regular rhythm. No murmurs, gallops, or rubs.  Respiratory:  Comfortable breathing with no accessory muscle use. Clear breath sounds anteriorly with no wheezes, rales, or rhonchi.  GI:  Abdomen nondistended, soft, and tender in RUQ.  Normal active bowel sounds. No enlargement of the liver or spleen. No masses palpable.  Rectal:  Deferred  Musculoskeletal:  No pitting edema of the lower legs.    Neurological:  Gross memory appears intact.  Patient is alert and oriented.  Lymphatic:  No cervical or supraclavicular  adenopathy.    Laboratory:    Recent Labs     01/26/21  0502   WBC 11.8*   HGB 12.7   HCT 38.6   PLT 230   MCV 91.9   NA 137   K 3.0*   CL 109   CO2 23   BUN 10   CREATININE 0.90   CALCIUM 9.0   MG 1.5*   PHOS 1.4*   GLUCOSE 116*   ALKPHOS 87   AST 110*   ALT 56   BILITOT 0.3   LABALBU 3.2*   PROT 6.7       Assessment:     Principal Problem:    Sepsis (HCC)  Active Problems:    Hypokalemia    Hypophosphatemia    Hypomagnesemia    Gastroparesis    Elevated liver enzymes    Autism    Depression    Weight loss  Resolved Problems:    * No resolved hospital problems. *    Acutely, she has evidence of sepsis.  I agree that the intussusception seen on CT is likely a transient artifact.  In the long-term, she has chronic RUQ pain with N/V without a firm diagnosis.  This appears to be associated with weight loss, presumably due to not eating, along with depleted electrolytes.  The elevated AST is new and most likely represents minor muscle injury from dehydration and electrolyte abnormalities,  since prior LFTs and CT of the liver are normal.    Plan:     Sepsis is being evaluated.  Needs IV repletion of the electrolytes.    Continue Motegrity (patient's own meds) plus Zyprexa.  The note from [place] is confusing about amitriptyline; the early portion seems to indicate a desire to increase the dose, but the later portion does not.  I suspect the early portion is copied from an earlier note, since her mother reports that she has not been on this for some time.  Minimize use of opioids due to risk of narcotic bowel syndrome.    Reassess ability to eat after treatment of infection.  If she cannot eat to maintain minimum nutrition, I would suggest jejunal feeding via PEG/J rather than TPN.  I will check prealbumin.

## 2021-01-26 NOTE — ED Provider Notes (Signed)
Care assumed from Dr. Carlton Adam at 7 AM    Review labs indicated the patient had a low potassium and low magnesium.  We have repleted both of those.    IV fluid boluses are ongoing.  Patient's lactic acid is decreased from 4.5-4.0 and she is due for a repeat level    CT abdomen pelvis reviewed.  Radiologist reports intussusception but no other acute findings    Will review case with general surgery.    Anticipate hospitalist admission after discussing a plan of care with general surgery.     Ky Barban, MD  01/26/21 [phone]

## 2021-01-26 NOTE — Care Coordination-Inpatient (Signed)
RNCM met with patient in room 211 to discuss discharge planning. Patient sleeping soundly, on RA. Patient's mother, Dr [name] at bedside to assist with assessment. Patient lives with parents in two level home with five steps to enter, approx fourteen interior steps. 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 Patient PCP is Dr Retta Diones and uses [facility] at [facility] station.   Patient current with [facility]. CM following for needs  .Marland KitchenMarland KitchenASSESSMENT NOTE    Attending Physician: Jacqlyn Krauss, MD  Admit Problem: Hypokalemia [E87.6]  Hypomagnesemia [E83.42]  Intussusception of small bowel ([place]) [K56.1]  Septic shock ([place]) [A41.9, R65.21]  Sepsis ([place]) [A41.9]  Non-intractable vomiting with nausea, unspecified vomiting type [R11.2]  Date/Time of Admission: 01/26/2021  5:01 AM  Problem List:  Patient Active Problem List   Diagnosis    ADHD    Autism    Depression    GERD (gastroesophageal reflux disease)    Weight loss    Sepsis ([place])    Hypokalemia    Hypophosphatemia    Hypomagnesemia    Gastroparesis    Elevated liver enzymes       Service Assessment  Patient Orientation Other (see comment) (sleeping)   Cognition     History Provided By Child/Family (patient's mother:  Robin Mendoza  [phone])   Primary Caregiver     Accompanied By/Relationship     Support Systems Parent, Family Members   [place] is: Legal Next of Kin (Parent:  Robin Mendoza)   PCP Verified by CM Yes (Dr Retta Diones [phone])   Last Visit to PCP Within last 3 months   Prior Functional Level Independent in ADLs/IADLs   Current Functional Level     Can patient return to prior living arrangement Yes   Ability to make needs known: Good   Family able to assist with home care needs: Yes   Would you like for me to discuss the discharge plan with any other family members/significant others, and if so, who?     Manufacturing engineer      CM/SW Referral       Social/Functional History  Lives With Parent   Type of [place] Two level   Home Access Stairs to enter without rails   Entrance Stairs - Number of Steps 5   Entrance Stairs - Rails     Bathroom Shower/Tub     Bathroom Warden/ranger     Home Equipment     Receives Help From Family   ADL Assistance Independent   Dietitian     Grooming     Feeding     Toileting     [place]     Meal Prep     Laundry     Vacuuming     Physiological scientist Work     Tourist information centre manager     Child Care     Other (Comment)     [place] Responsibilities     Meal Prep Engineer, structural Responsibility     Dependent Care Responsibility     Health Care Management     Other (  Comment)     Ambulation Assistance Independent   Transfer Assistance Independent   Active Driver     Patient's Driver Info     Mode of Transportation     Education     Occupation Full time employment   Type of Occupation Type Groomer     Discharge Planning   Type of [place] Family Members, Parent   Support Systems Parent, Family Members   Current Services Prior To Admission None   Potential Assistance Needed N/A   DME     DME     DME Ordered? No   Potential Assistance Purchasing Medications No   Meds-to-Beds: Does the patient want to have any new prescriptions delivered to bedside prior to discharge?     Type of Home Care Services None   Patient expects to be discharged to: House   Follow Up Appointment: Best Day/Time     One/Two Story Residence: Two story   # of Interior Steps 14   Height of Each Step (in)     Yahoo Available     History of [place]?       Services At/After Discharge  Transition of Care Consult (CM Consult): Discharge Planning, [place]   Internal Home Health     Internal Hospice      Reason Outside Agency [place]     Partner SNF     Reason Why Partner SNF Not Chosen     Internal Comfort Care     Reason Outside [place] Discharge [place] Resource Information Provided? No   Mode of Transport at Discharge Other (see comment) (Parent: [facility])   [facility] Transport Time of Discharge     Confirm Follow Up Transport Family     Condition of Participation: Discharge Planning  The plan for Transition of Care is related to the following treatment goals: return to baseline   The Patient and/or Patient Representative was provided with a Choice of Provider? Patient, Patient Representative   Name of the Patient Representative who was provided with the Choice of Provider and agrees with the Discharge Plan? parent: Robin Mendoza   The Patient and/or Patient Representative Agree with the Discharge Plan? Yes   Freedom of Choice list was provided with basic dialogue that supports the individualized plan of care/goals, treatment preferences, and shares the quality data associated with the providers? Yes     Documentation for Discharge Appeal  Discharge Appealed by     Date notified by [facility]IO of appeal request:     Time notified by QIO of appeal request:     Detailed Notice of Discharge given to:     Date Notice of Discharge given:     Time Notice of Discharge given:     Date records sent to [place]     Time records sent to [place]     Date Notified of Outcome     Time Notified of Outcome     Outcome of appeal           Leotis Shames, RN 01/26/21 4:04 PM

## 2021-01-26 NOTE — Progress Notes (Signed)
Reviewed notes for new spiritual concerns.      NOTED AUTISM    LOVING FAMILY PRESENT    STAFF AT BEDSIDE              Will follow as needed.

## 2021-01-26 NOTE — ED Notes (Signed)
TRANSFER - OUT REPORT:    Verbal report given to [place] on Robin Mendoza  being transferred to 2nd for routine progression of patient care       Report consisted of patient's Situation, Background, Assessment and   Recommendations(SBAR).     Information from the following report(s) ED SBAR was reviewed with the receiving nurse.    Lines:   Peripheral IV 01/06/21 (Active)        Opportunity for questions and clarification was provided.      Patient transported with:  Rebecca Eaton, RN  01/26/21 1216

## 2021-01-26 NOTE — Consults (Signed)
Subjective:     Patient is a 20 y.o. Caucasian female presents with abdominal pain.  Ports cute onset of right lower quadrant abdominal pain in the early morning hours.  Pain is sharp with intermittent exacerbations.  Has nausea and vomiting multiple times.  She denies diarrhea constipation hematochezia hematemesis or melena.  She does report fever to 103 at home.  Has had significant issues in the past.  She has been diagnosed with gastroparesis and is recently been put on medication.  She has had a GI work-up at [facility] has had history of diagnosis of colitis in the past colonoscopies have been negative.     Had a laparoscopic cholecystectomy and laparoscopic appendectomy in May.      Patient Active Problem List    Diagnosis Date Noted    Sepsis ([place]) 01/26/2021    ADHD 10/05/2020    Autism 10/05/2020    Depression 10/05/2020    GERD (gastroesophageal reflux disease) 10/05/2020    Weight loss 10/05/2020     Past Medical History:   Diagnosis Date    ADHD     managed with meds    Autism     Depression     GERD (gastroesophageal reflux disease)     Weight loss       Past Surgical History:   Procedure Laterality Date    CHOLECYSTECTOMY, LAPAROSCOPIC N/A 10/05/2020    CHOLECYSTECTOMY LAPAROSCOPIC performed by Azalia Bilis, MD at [place] N/A 09/17/2020    COLONOSCOPY/BMI 21 performed by Flint Melter, MD at [facility] ENDOSCOPY    COLONOSCOPY N/A 01/14/2019    COLONOSCOPY/  BMI 21 PT IS AUTISTIC performed by Flint Melter, MD at [place] Bilateral     as a baby    LAPAROSCOPIC APPENDECTOMY N/A 10/05/2020    APPENDECTOMY LAPAROSCOPIC performed by Azalia Bilis, MD at [place]        Not in a hospital admission.  Allergies   Allergen Reactions    Cow's Milk Anaphylaxis and Hives    Food Anaphylaxis    Lac Bovis Anaphylaxis    Albumen, Egg Other (See Comments)     No longer allergic      Dog Epithelium Allergy Skin Test Itching     Other Itching      Social History     Tobacco Use    Smoking status: Never    Smokeless tobacco: Never   Substance Use Topics    Alcohol use: Not Currently      Family History   Problem Relation Age of Onset    Cancer Mother         bilateral breast cancer    No Known Problems Father     Hypertension Mother       Review of Systems  Pertinent items are noted in HPI.    Objective:     Patient Vitals for the past 8 hrs:   BP Temp Temp src Pulse Resp SpO2 Height Weight   01/26/21 1000 (!) 105/46 98.8 ??F (37.1 ??C) Oral (!) 121 16 97 % -- --   01/26/21 0602 (!) 104/55 98.4 ??F (36.9 ??C) Oral 88 -- 97 % -- --   01/26/21 0552 -- -- -- -- -- 96 % -- --   01/26/21 0543 -- -- -- -- -- 96 % -- --   01/26/21 0542 115/61 -- -- -- -- 98 % -- --  01/26/21 0532 132/79 -- -- -- -- 98 % -- --   01/26/21 0528 97/69 (!) 102.2 ??F (39 ??C) Oral (!) 168 18 97 % -- --   01/26/21 0527 -- -- -- -- -- -- '5\' 5"'$  (1.651 m) 114 lb (51.7 kg)   01/26/21 0522 -- -- -- -- -- 97 % -- --   01/26/21 0512 (!) 95/53 -- -- -- -- 97 % -- --   01/26/21 0502 (!) 96/56 -- -- -- -- -- -- --   01/26/21 0433 -- -- -- -- -- -- -- 114 lb (51.7 kg)   01/26/21 0422 97/69 (!) 102.2 ??F (39 ??C) Oral (!) 168 18 97 % -- --     No intake/output data recorded.  No intake/output data recorded.        General: well appearing, no acute distress, alert and oriented  Eyes: PERRLA, sclerae white  ENT: External inspection of ears and nose normal, oropharynx normal  Respiratory: normal chest wall expansion, lungs CTA bilaterally  Cardiovascular: RRR, no m, femoral pulses 2+ bilaterally; extremities without edema  Abdomen: Soft, right lower quadrant percussion or rebound tenderness, non-distended, no masses or hernias  Heme/Lymph: without cervical or inguinal adenopathy  Musculoskeletal: gait normal, digits without clubbing or cyanosis  Integumentary: warm, dry, and pink, with no rash, purpura, or petechia  Neurological: Cranial Nerves II-XII grossly intact, normal sensation and  muscle strength bilaterally     Data ReviewCBC:   Lab Results   Component Value Date/Time    WBC 11.8 01/26/2021 05:02 AM    RBC 4.20 01/26/2021 05:02 AM     BMP:   Lab Results   Component Value Date/Time    GLUCOSE 116 01/26/2021 05:02 AM    CO2 23 01/26/2021 05:02 AM    BUN 10 01/26/2021 05:02 AM    CREATININE 0.90 01/26/2021 05:02 AM    CALCIUM 9.0 01/26/2021 05:02 AM     Radiology review:   CT of the Abdomen and Pelvis with contrast       CLINICAL INDICATION:  Acute on chronic moderate vomiting and generalized   abdominal pain, gastroparesis, intermittent fevers since yesterday, subacute   diarrhea.  History of cholecystectomy, appendectomy, autism, depression, GERD,   weight loss.       COMPARISON: 11/08/2020, 10/22/2020, 09/07/2020.       TECHNIQUE: Dose reduction technique used: Automated exposure Control and/or   adjustment of mA and kV according to patient size. Multiple axial images were   obtained through the abdomen and pelvis after intravenous injection of 125cc of   isovue 370 IV contrast to further evaluate vessels and organs. Coronal   reformatted images were done for further evaluation of bones and organs. Oral   contrast was given for further evaluation of GI tract and adjacent organs.       FINDINGS: Partially included lung bases demonstrate trace to small posterior   layering pleural effusions bilaterally.       Abdomen:  There are no suspicious lesions in the liver or spleen.    Cholecystectomy noted without obvious complication..  The adrenal glands and   pancreas appear unremarkable.  There is normal enhancement of the kidneys, no   hydronephrosis.         No lymphadenopathy. No free air, focal inflammatory changes or fluid collections   in the abdomen. Aorta normal caliber.       There is no evidence of bowel obstruction. Short segment jejunal intussusception   noted in left upper  abdomen (axial 32, coronal 50).  Multiple nonspecific   borderline prominent small bowel loops elsewhere, possibly  enteritis.  Oral   contrast has only reached the level of the distal stomach, limiting GI tract   evaluation throughout.  Consider follow-up GI evaluation and/or direct   visualization if persisting clinical concern and indication.  Mildly-moderately   prominent stool volume in large bowel.       Pelvis:  No acute inflammatory changes or fluid collections in the pelvis.  No   lymphadenopathy or mass.  Intrauterine device in place.  Small volume   nonspecific pelvic free fluid which could be physiologic given patient age.       Bones: No acute osseous lesions.               Impression   1.  Trace to small pleural effusions in both lung bases.   2.  Left upper abdominal small bowel intussusception, most often a transient and   self-limited process.   3.  Cholecystectomy.           Assessment:     Principal Problem:    Sepsis ([place])  Resolved Problems:    * No resolved hospital problems. *      Plan:     Patient has abdominal pain of uncertain etiology.  While she does have a radiographic finding of intussusception this is nowhere near where her pain is and my experience with intussusception is it is transitory and resolve spontaneously.  There is no need for any acute surgical intervention.    She had an extensive work-up and I do not know what I could add to further elucidate the issues.  I mentioned the possibility of ischemia but she is far too young for me to suspect she has vascular disease     This may be related to new medication or exacerbation of chronic GI issues      Recommendation is nonoperative management with IV hydration and antibiotics    I would recommend GI evaluation    I will continue to follow

## 2021-01-26 NOTE — ED Provider Notes (Signed)
Emergency Department Provider Note                   PCP:                Joyce Gross, MD               Age: 20 y.o.      Sex: female       ICD-10-CM    1. Septic shock (HCC)  A41.9     R65.21           DISPOSITION    Admit       MDM  Number of Diagnoses or Management Options  Septic shock ([place])  Diagnosis management comments: Patient appears to have sepsis with no easily identifiable source.  Her chest x-ray is clear COVID-negative.  Her only symptom is fever and worse abdominal pain than normal.  Given this CT scan had to be ordered to make sure there is no surgical cause.  Patient given fluids and antibiotics.  Plan will be for patient to be admitted following CT scan patient want to be signed out to a.m. Dr. for final disposition.  Patient's heart rate has responded greatly to interventions.  Blood pressure improving as well.    Sepsis Reassessment note:     Robin Mendoza meets criteria for Sepsis -    Patient is receiving IV Fluids and Antibiotics per sepsis protocol.    I did the Sepsis Reassessment at 6:42 AM 01/26/21.    Freddi Che, MD             Amount and/or Complexity of Data Reviewed  Clinical lab tests: ordered and reviewed  Tests in the radiology section of CPT??: ordered and reviewed         Orders Placed This Encounter   Procedures    Culture, Blood 1    Culture, Blood 1    Rapid influenza A/B antigens    COVID-19, Rapid    XR CHEST PORTABLE    CT ABDOMEN PELVIS W IV CONTRAST Additional Contrast? Oral    Lactic Acid    Lactic Acid    CBC with Auto Differential    CMP    Procalcitonin    Magnesium    Phosphorus    Cardiac Monitor - ED Only    Straight Cath (Select if patient is unable to provide a sample)    POCT Urinalysis no Micro    EKG 12 Lead    Saline lock IV        Medications given in the Emergency Department:  Medications   lactated ringers bolus (has no administration in time range)   promethazine (PHENERGAN) injection 25 mg (has no administration in time range)    diatrizoate meglumine-sodium (GASTROGRAFIN) 66-10 % solution 15 mL (has no administration in time range)   0.9 % sodium chloride bolus (has no administration in time range)   sodium chloride flush 0.9 % injection 10 mL (has no administration in time range)   iopamidol (ISOVUE-370) 76 % injection 100 mL (has no administration in time range)   potassium chloride 10 mEq/100 mL IVPB (Peripheral Line) (has no administration in time range)   LORazepam (ATIVAN) injection 1 mg (1 mg IntraVENous Given 01/26/21 0542)   piperacillin-tazobactam (ZOSYN) 4,500 mg in sodium chloride 0.9 % 100 mL IVPB (mini-bag) (4,500 mg IntraVENous New Bag 01/26/21 0601)   ketorolac (TORADOL) injection 15 mg (15 mg IntraVENous Given 01/26/21 0602)  New Prescriptions    No medications on file        Robin Mendoza is a 20 y.o. female who presents to the Emergency Department with chief complaint of    Chief Complaint   Patient presents with    Abdominal Pain      Patient is complicated history of some mild autism and chronic abdominal pain and vomiting.  She has been diagnosed with colitis on CT scans before family states she has had negative colonoscopies for colitis.  The working diagnosis now is gastroparesis.  She is followed at [facility] for these.  She has had a history of a cholecystectomy and appendectomy.  They report new onset fever that started yesterday.  She has been spiking up to 102 and 103 over the last day.  She did take Tylenol and ibuprofen without much relief.  The last time she had any vomiting and diarrhea was 3 days ago but she has not had any since.  She still having bowel movements.  She does report more abdominal pain than usual.  There has been no other associated symptoms.  Specifically no cough, congestion, trouble breathing, dysuria, frequency.  History is provided by mother who is a local gynecologist and some from the patient.        All other systems reviewed and are negative unless otherwise stated in the History  of Present Illness section.       Past Medical History:   Diagnosis Date    ADHD     managed with meds    Autism     Depression     GERD (gastroesophageal reflux disease)     Weight loss         Past Surgical History:   Procedure Laterality Date    CHOLECYSTECTOMY, LAPAROSCOPIC N/A 10/05/2020    CHOLECYSTECTOMY LAPAROSCOPIC performed by Azalia Bilis, MD at [facility] MAIN OR    COLONOSCOPY N/A 09/17/2020    COLONOSCOPY/BMI 21 performed by Flint Melter, MD at [facility] ENDOSCOPY    COLONOSCOPY N/A 01/14/2019    COLONOSCOPY/  BMI 21 PT IS AUTISTIC performed by Flint Melter, MD at [facility] ENDOSCOPY    EAR SURGERY Bilateral     as a baby    LAPAROSCOPIC APPENDECTOMY N/A 10/05/2020    APPENDECTOMY LAPAROSCOPIC performed by Azalia Bilis, MD at [facility] MAIN OR    UPPER GASTROINTESTINAL ENDOSCOPY          Family History   Problem Relation Age of Onset    Cancer Mother         bilateral breast cancer    No Known Problems Father     Hypertension Mother         Social History     Socioeconomic History    Marital status: Single   Tobacco Use    Smoking status: Never    Smokeless tobacco: Never   Substance and Sexual Activity    Alcohol use: Not Currently    Drug use: Never        Allergies: Cow's milk; Food; Lac bovis; Albumen, egg; Dog epithelium allergy skin test; and Other    Previous Medications    ACETAMINOPHEN (TYLENOL) 500 MG TABLET    Take 500 mg by mouth every 6 hours as needed for Pain (breakthrough pain).    AZITHROMYCIN (ZITHROMAX) 200 MG/5ML SUSPENSION    Take 200 mg by mouth daily. Take 5 ml by mouth daily    EPINEPHRINE 0.3 MG/0.3ML SOSY  Inject 0.3 mg into the muscle 1 (one) time if needed (allergic reaction).    ERYTHROMYCIN BASE (E-MYCIN) 250 MG TABLET    Take 250 mg by mouth 3 times daily. gastric motility    ERYTHROMYCIN BASE (E-MYCIN) 250 MG TABLET    Take 250 mg by mouth 3 times daily. gastric motility    FAMOTIDINE (PEPCID PO)    Take 10 mg by mouth daily    GUANFACINE (INTUNIV) 1 MG TB24 EXTENDED  RELEASE TABLET    Take 1 mg by mouth daily     GUANFACINE (INTUNIV) 1 MG TB24 EXTENDED RELEASE TABLET    Take 1 mg by mouth daily.    GUANFACINE (INTUNIV) 4 MG TB24 EXTENDED RELEASE TABLET    Take 4 mg by mouth daily.    KETOROLAC TROMETHAMINE (TORADOL ORAL PO)    Take 10 mg by mouth every 6 hours as needed (pain).    LEVONORGESTREL (MIRENA, 52 MG,) IUD 52 MG    1 each by IntraUTERine route once    OLANZAPINE (ZYPREXA) 2.5 MG TABLET    Take 5 mg by mouth nightly    OMALIZUMAB 150 MG INJECTION    Inject 150 mg into the muscle every 28 days    ONDANSETRON (ZOFRAN-ODT) 8 MG TBDP DISINTEGRATING TABLET    Place 1 tablet under the tongue every 8 hours as needed for Nausea or Vomiting    POLYETHYLENE GLYCOL (MIRALAX) 17 G PACKET    Take 17 g by mouth daily.    PREGABALIN (LYRICA) 25 MG CAPSULE    Take 25 mg by mouth daily.    PROCHLORPERAZINE (COMPAZINE) 10 MG TABLET    Take 5 mg by mouth every 6 hours as needed (nausea).    PROMETHAZINE (PHENERGAN) 25 MG TABLET    Take 25 mg by mouth every 6 hours as needed for Nausea    SCOPOLAMINE (TRANSDERM-SCOP) PATCH    Place 1 patch onto the skin every 72 hours as needed (nausea).    SCOPOLAMINE (TRANSDERM-SCOP) TRANSDERMAL PATCH    Place 1 patch onto the skin every 72 hours.    SENNOSIDES (SENNA LAX PO)    Take 1 tablet by mouth daily as needed (constipation).    SERTRALINE HCL (ZOLOFT PO)    Take 75 mg by mouth daily    VITAMIN B-12 (CYANOCOBALAMIN) 1000 MCG TABLET    Take 1,000 mcg by mouth daily.    VYVANSE 60 MG CHEW            Vitals signs and nursing note reviewed.   ED Triage Vitals   Enc Vitals Group      BP 01/26/21 0422 97/69      Heart Rate 01/26/21 0422 (!) 168      Resp 01/26/21 0422 18      Temp 01/26/21 0422 (!) 102.2 ??F (39 ??C)      Temp Source 01/26/21 0422 Oral      SpO2 01/26/21 0422 97 %      Weight 01/26/21 0433 114 lb (51.7 kg)      Height --       Head Circumference --       Peak Flow --       Pain Score --       Pain Loc --       Pain Edu? --       Excl. in  [place]? --         Physical Exam  Vitals and nursing note reviewed.  Constitutional:       General: She is not in acute distress.     Appearance: She is not ill-appearing, toxic-appearing or diaphoretic.      Comments: Thin calm appearing female   HENT:      Head: Normocephalic and atraumatic.   Eyes:      General: No scleral icterus.     Conjunctiva/sclera: Conjunctivae normal.   Cardiovascular:      Rate and Rhythm: Regular rhythm. Tachycardia present.   Pulmonary:      Effort: Pulmonary effort is normal. No respiratory distress.      Breath sounds: No stridor. No wheezing, rhonchi or rales.   Abdominal:      General: Abdomen is flat. A surgical scar is present.      Palpations: Abdomen is soft.      Tenderness: There is generalized abdominal tenderness. There is no right CVA tenderness, left CVA tenderness, guarding or rebound. Negative signs include Murphy's sign, Rovsing's sign and psoas sign.      Hernia: No hernia is present.   Musculoskeletal:      Cervical back: Normal range of motion and neck supple.   Skin:     Capillary Refill: Capillary refill takes less than 2 seconds.   Neurological:      General: No focal deficit present.      Mental Status: She is alert and oriented to person, place, and time. Mental status is at baseline.   Psychiatric:         Mood and Affect: Mood normal.         Behavior: Behavior normal.        Procedures    ===================================  ED EKG Interpretation  EKG was interpreted in the absence of a cardiologist.    Rate: 162  EKG Interpretation: EKG Interpretation: sinus tachycardia rhythm  ST Segments: Specific ST segments - NO STEMI    Freddi Che, MD 6:53 AM     Results Include:    Recent Results (from the past 24 hour(s))   EKG 12 Lead    Collection Time: 01/26/21  4:38 AM   Result Value Ref Range    Ventricular Rate 162 BPM    Atrial Rate 162 BPM    P-R Interval 112 ms    QRS Duration 66 ms    Q-T Interval 296 ms    QTc Calculation (Bazett) 485 ms    P Axis 80  degrees    R Axis 78 degrees    T Axis 72 degrees    Diagnosis Sinus tachycardia    Lactic Acid    Collection Time: 01/26/21  5:02 AM   Result Value Ref Range    Lactic Acid, Plasma 4.5 (HH) 0.4 - 2.0 MMOL/L   CBC with Auto Differential    Collection Time: 01/26/21  5:02 AM   Result Value Ref Range    WBC 11.8 (H) 4.3 - 11.1 K/uL    RBC 4.20 4.05 - 5.2 M/uL    Hemoglobin 12.7 11.7 - 15.4 g/dL    Hematocrit 38.6 35.8 - 46.3 %    MCV 91.9 79.6 - 97.8 FL    MCH 30.2 26.1 - 32.9 PG    MCHC 32.9 31.4 - 35.0 g/dL    RDW 12.6 11.9 - 14.6 %    Platelets 230 150 - 450 K/uL    MPV 10.4 9.4 - 12.3 FL    nRBC 0.00 0.0 - 0.2 K/uL    Differential Type AUTOMATED  Seg Neutrophils 98 (H) 43 - 78 %    Lymphocytes 2 (L) 13 - 44 %    Monocytes 0 (L) 4.0 - 12.0 %    Eosinophils % 0 (L) 0.5 - 7.8 %    Basophils 0 0.0 - 2.0 %    Immature Granulocytes 0 0.0 - 5.0 %    Segs Absolute 11.5 (H) 1.7 - 8.2 K/UL    Absolute Lymph # 0.2 (L) 0.5 - 4.6 K/UL    Absolute Mono # 0.0 (L) 0.1 - 1.3 K/UL    Absolute Eos # 0.0 0.0 - 0.8 K/UL    Basophils Absolute 0.0 0.0 - 0.2 K/UL    Absolute Immature Granulocyte 0.0 0.0 - 0.5 K/UL   CMP    Collection Time: 01/26/21  5:02 AM   Result Value Ref Range    Sodium 137 136 - 145 mmol/L    Potassium 3.0 (L) 3.5 - 5.1 mmol/L    Chloride 109 101 - 110 mmol/L    CO2 23 21 - 32 mmol/L    Anion Gap 5 4 - 13 mmol/L    Glucose 116 (H) 65 - 100 mg/dL    BUN 10 6 - 23 MG/DL    Creatinine 0.90 0.6 - 1.0 MG/DL    GFR African American >60 >60 ml/min/1.27m    GFR Non-African American >60 >60 ml/min/1.757m   Calcium 9.0 8.3 - 10.4 MG/DL    Total Bilirubin 0.3 0.2 - 1.1 MG/DL    ALT 56 12 - 65 U/L    AST 110 (H) 15 - 37 U/L    Alk Phosphatase 87 50 - 136 U/L    Total Protein 6.7 6.3 - 8.2 g/dL    Albumin 3.2 (L) 3.5 - 5.0 g/dL    Globulin 3.5 2.3 - 3.5 g/dL    Albumin/Globulin Ratio 0.9 (L) 1.2 - 3.5     Rapid influenza A/B antigens    Collection Time: 01/26/21  5:35 AM    Specimen: Nasal Washing   Result Value Ref Range     Influenza A Ag Negative NEG      Influenza B Ag Negative NEG      Source Nasopharyngeal     COVID-19, Rapid    Collection Time: 01/26/21  5:35 AM    Specimen: Nasopharyngeal   Result Value Ref Range    Source NASAL      SARS-CoV-2, Rapid Not detected NOTD     POCT Urinalysis no Micro    Collection Time: 01/26/21  6:34 AM   Result Value Ref Range    Specific Gravity, Urine, POC <=1.005 1.001 - 1.023      pH, Urine, POC 5.5 5.0 - 9.0      Protein, Urine, POC Negative NEG mg/dL    Glucose, UA POC Negative NEG mg/dL    Ketones, Urine, POC Negative NEG mg/dL    Bilirubin, Urine, POC Negative NEG      Blood, UA POC Negative NEG      URINE UROBILINOGEN POC 0.2 0.2 - 1.0 EU/dL    Nitrate, Urine, POC Negative NEG      Leukocyte Est, UA POC Negative NEG      Performed by: [employer]         XR CHEST PORTABLE   Final Result   Normal chest x-ray.      CT ABDOMEN PELVIS W IV CONTRAST Additional Contrast? Oral    (Results Pending)  Voice dictation software was used during the making of this note.  This software is not perfect and grammatical and other typographical errors may be present.  This note has not been completely proofread for errors.     Freddi Che, MD  01/26/21 [phone]

## 2021-01-26 NOTE — Progress Notes (Signed)
TRANSFER - IN REPORT:    Verbal report received from [place], RN on Robin Mendoza  being received from ED for routine progression of patient care      Report consisted of patient's Situation, Background, Assessment and   Recommendations(SBAR).     Information from the following report(s) Nurse Handoff Report was reviewed with the receiving nurse.    Opportunity for questions and clarification was provided.      Assessment completed upon patient's arrival to unit and care assumed.

## 2021-01-26 NOTE — ED Triage Notes (Signed)
Pt. Ambulatory to triage. Pt. Parents state at 1700 pt had fever 103.6. Pt. Fever broke after medication and returned 0300 and gav tyleno and ibuprofen PTA. Fever in triage 102.2. Pt has gastroporesis and mild autism. Pt. C/o RLQ pain.

## 2021-01-26 NOTE — Progress Notes (Signed)
END OF SHIFT NOTE:    INTAKE/OUTPUT  No intake/output data recorded.  Voiding: Yes  Catheter: No  Drain:              Flatus: Patient does have flatus present.    Stool:  0 occurrences.    Characteristics:                Emesis:  1 occurrences.    Characteristics:        VITAL SIGNS  Patient Vitals for the past 12 hrs:   Temp Pulse Resp BP SpO2   01/26/21 1455 -- (!) 131 -- -- --   01/26/21 1340 99.8 ??F (37.7 ??C) (!) 122 16 (!) 97/44 97 %   01/26/21 1000 98.8 ??F (37.1 ??C) (!) 121 16 (!) 105/46 97 %       Pain Assessment  '@BSHSIFLOW'$ (13)@  Pain Location: Abdomen  Patient's Stated Pain Goal: 0 - No pain    Ambulating  Yes    Shift report given to oncoming nurse at the bedside.    Malaina Mortellaro J. Meryle Ready, RN

## 2021-01-26 NOTE — H&P (Signed)
Hospitalist History and Physical   Admit Date:  01/26/2021  5:01 AM   Name:  Robin Mendoza   Age:  20 y.o.  Sex:  female  DOB:  [birth year]   MRN:  [id number]   Room:  ER03/03    Presenting Complaint: Abdominal Pain     Reason(s) for Admission: Sepsis ([place]) [A41.9]     History of Present Illness:     Robin Mendoza is a 20 y.o. female with medical history of  highly functional autism, depression, chronic gastroparesis followed by [facility], who presented with nausea, emesis and abdominal pain.  Mother Dr. [name] is at bedside and relates history.   She has had a local GI workup s/op EGD/ colonoscopy. Admitted to [facility] for electrolyte issues. Seen at [facility] and recommending motegrity with the diagnosis of idiopathic gastroparesis.     She has not started the motegrity yet but has this on hand.    Continues to have nausea/ emesis abdominal pain. Poor oral intake. No longer able to function well/ stopped working.       Has been getting IVF at home.       Meets sepsis criteria due to lactic acidosis, fever, tachycardia, hypotension.     S/p vancomycin/ zosyn.     Blood cultures ordered.     CXR negative.     UA negative.     COVID and flu negative.       She has electrolytes that are low and being supplemented.                 CTAP shows"     Impression   1.  Trace to small pleural effusions in both lung bases.   2.  Left upper abdominal small bowel intussusception, most often a transient and   self-limited process.   3.  Cholecystectomy.   "      Surgery has evaluated and recommends supportive care.     Mother is Dr. [name], OBGYN        Review of Systems:  10 systems limited due to mentation   Has hand edema     Assessment & Plan:     Principal Problem:    Sepsis ([place])  Plan:   Admit to remote tele   D1 vancomycin/ zosyn  Followup blood cultures         Active Problems:    Hypokalemia  Plan:     Hypophosphatemia  Plan:     Hypomagnesemia  Plan:   Replace and repeat labs 6 pm    Followup tele   Nutrition consult             Gastroparesis  Plan:   Clear liquids  Surgery following  GI to evaluate- I spoke with Dr. Arvella Nigh  Resume motegrity            Elevated liver enzymes  Plan:   Trend lab          Autism  Plan:     Depression  Plan:   Resume intuniv, zyprexa, zoloft         Anticipated discharge needs:     Pending     Diet: Diet NPO  VTE ppx: Lovenox  Code status: Full Code    Hospital Problems:  Principal Problem:    Sepsis ([place])  Active Problems:    Hypokalemia    Hypophosphatemia    Hypomagnesemia    Gastroparesis    Elevated liver enzymes  Autism    Depression    Weight loss  Resolved Problems:    * No resolved hospital problems. *       Past History:     Past Medical History:   Diagnosis Date    ADHD     managed with meds    Autism     Depression     GERD (gastroesophageal reflux disease)     Weight loss        Past Surgical History:   Procedure Laterality Date    CHOLECYSTECTOMY, LAPAROSCOPIC N/A 10/05/2020    CHOLECYSTECTOMY LAPAROSCOPIC performed by Azalia Bilis, MD at [facility] MAIN OR    COLONOSCOPY N/A 09/17/2020    COLONOSCOPY/BMI 21 performed by Flint Melter, MD at [facility] ENDOSCOPY    COLONOSCOPY N/A 01/14/2019    COLONOSCOPY/  BMI 21 PT IS AUTISTIC performed by Flint Melter, MD at [facility] ENDOSCOPY    EAR SURGERY Bilateral     as a baby    LAPAROSCOPIC APPENDECTOMY N/A 10/05/2020    APPENDECTOMY LAPAROSCOPIC performed by Azalia Bilis, MD at [facility] MAIN OR    UPPER GASTROINTESTINAL ENDOSCOPY          Social History     Tobacco Use    Smoking status: Never    Smokeless tobacco: Never   Substance Use Topics    Alcohol use: Not Currently      Social History     Substance and Sexual Activity   Drug Use Never       Family History   Problem Relation Age of Onset    Cancer Mother         bilateral breast cancer    No Known Problems Father     Hypertension Mother         Immunization History   Administered Date(s) Administered    COVID-19, PFIZER PURPLE top, DILUTE for use,  (age 15 y+), 50mg/0.3mL 07/20/2019, 08/10/2019, 04/09/2020    HPV 9-valent (Wilburt Finlay 04/01/2017, 08/06/2017, 07/07/2018     Allergies   Allergen Reactions    Cow's Milk Anaphylaxis and Hives    Food Anaphylaxis    Lac Bovis Anaphylaxis    Albumen, Egg Other (See Comments)     No longer allergic      Dog Epithelium Allergy Skin Test Itching    Other Itching     Prior to Admit Medications:  Current Outpatient Medications   Medication Instructions    acetaminophen (TYLENOL) 500 mg, Oral, EVERY 6 HOURS PRN    EPINEPHrine 0.3 mg, IntraMUSCular, Once as needed    Famotidine (PEPCID PO) 10 mg, Oral, DAILY    guanFACINE (INTUNIV) 1 mg, Oral, DAILY    guanFACINE (INTUNIV) 4 mg, Oral, DAILY    guanFACINE (INTUNIV) 1 mg, Oral, DAILY    Ketorolac Tromethamine (TORADOL ORAL PO) 10 mg, EVERY 6 HOURS PRN    levonorgestrel (MIRENA, 52 MG,) IUD 52 mg 1 each, IntraUTERine, ONCE    OLANZapine (ZYPREXA) 5 mg, Oral, NIGHTLY    ondansetron (ZOFRAN-ODT) 8 mg, SubLINGual, EVERY 8 HOURS PRN    prochlorperazine (COMPAZINE) 5 mg, Oral, EVERY 6 HOURS PRN    promethazine (PHENERGAN) 25 mg, Oral, EVERY 6 HOURS PRN    Sertraline HCl (ZOLOFT PO) 75 mg, Oral, DAILY    VYVANSE 60 MG CHEW No dose, route, or frequency recorded.         Objective:   Patient Vitals for the past 24 hrs:   Temp Pulse Resp BP  SpO2   01/26/21 1000 98.8 ??F (37.1 ??C) (!) 121 16 (!) 105/46 97 %   01/26/21 0602 98.4 ??F (36.9 ??C) 88 -- (!) 104/55 97 %   01/26/21 0552 -- -- -- -- 96 %   01/26/21 0543 -- -- -- -- 96 %   01/26/21 0542 -- -- -- 115/61 98 %   01/26/21 0532 -- -- -- 132/79 98 %   01/26/21 0528 (!) 102.2 ??F (39 ??C) (!) 168 18 97/69 97 %   01/26/21 0522 -- -- -- -- 97 %   01/26/21 0512 -- -- -- (!) 95/53 97 %   01/26/21 0502 -- -- -- (!) 96/56 --   01/26/21 0422 (!) 102.2 ??F (39 ??C) (!) 168 18 97/69 97 %       Oxygen Therapy  SpO2: 97 %  O2 Device: None (Room air)    Estimated body mass index is 18.97 kg/m?? as calculated from the following:    Height as of this  encounter: '5\' 5"'  (1.651 m).    Weight as of this encounter: 114 lb (51.7 kg).  No intake or output data in the 24 hours ending 01/26/21 1302      Physical Exam:    Blood pressure (!) 105/46, pulse (!) 121, temperature 98.8 ??F (37.1 ??C), temperature source Oral, resp. rate 16, height '5\' 5"'  (1.651 m), weight 114 lb (51.7 kg), SpO2 97 %.  General:    Well nourished.  Alert, appears to feel unwell  Head:  Normocephalic, atraumatic  Eyes:  Sclerae appear normal.  Pupils equally round.  ENT:  Nares appear normal, no drainage.  Dry  oral mucosa  Neck:  No restricted ROM.  Trachea midline   CV:   Regular and tachycardic,.  No m/r/g.  No jugular venous distension. No edema   Lungs:   CTAB.  No wheezing, rhonchi, or rales.  Symmetric expansion. Anterior   Abdomen:   Bowel sounds decreased Soft, diffusely tender, nondistended.  Extremities: No cyanosis or clubbing.  No edema  Skin:     No rashes and normal coloration.   Warm and dry.    Neuro:  grossly intact.    Psych:  Normal mood and affect.      I have personally reviewed labs and tests showing:  Recent Labs:  Recent Results (from the past 24 hour(s))   EKG 12 Lead    Collection Time: 01/26/21  4:38 AM   Result Value Ref Range    Ventricular Rate 162 BPM    Atrial Rate 162 BPM    P-R Interval 112 ms    QRS Duration 66 ms    Q-T Interval 296 ms    QTc Calculation (Bazett) 485 ms    P Axis 80 degrees    R Axis 78 degrees    T Axis 72 degrees    Diagnosis Sinus tachycardia    Lactic Acid    Collection Time: 01/26/21  5:02 AM   Result Value Ref Range    Lactic Acid, Plasma 4.5 (HH) 0.4 - 2.0 MMOL/L   CBC with Auto Differential    Collection Time: 01/26/21  5:02 AM   Result Value Ref Range    WBC 11.8 (H) 4.3 - 11.1 K/uL    RBC 4.20 4.05 - 5.2 M/uL    Hemoglobin 12.7 11.7 - 15.4 g/dL    Hematocrit 38.6 35.8 - 46.3 %    MCV 91.9 79.6 - 97.8 FL    MCH 30.2 26.1 -  32.9 PG    MCHC 32.9 31.4 - 35.0 g/dL    RDW 12.6 11.9 - 14.6 %    Platelets 230 150 - 450 K/uL    MPV 10.4 9.4 - 12.3  FL    nRBC 0.00 0.0 - 0.2 K/uL    Differential Type AUTOMATED      Seg Neutrophils 98 (H) 43 - 78 %    Lymphocytes 2 (L) 13 - 44 %    Monocytes 0 (L) 4.0 - 12.0 %    Eosinophils % 0 (L) 0.5 - 7.8 %    Basophils 0 0.0 - 2.0 %    Immature Granulocytes 0 0.0 - 5.0 %    Segs Absolute 11.5 (H) 1.7 - 8.2 K/UL    Absolute Lymph # 0.2 (L) 0.5 - 4.6 K/UL    Absolute Mono # 0.0 (L) 0.1 - 1.3 K/UL    Absolute Eos # 0.0 0.0 - 0.8 K/UL    Basophils Absolute 0.0 0.0 - 0.2 K/UL    Absolute Immature Granulocyte 0.0 0.0 - 0.5 K/UL   CMP    Collection Time: 01/26/21  5:02 AM   Result Value Ref Range    Sodium 137 136 - 145 mmol/L    Potassium 3.0 (L) 3.5 - 5.1 mmol/L    Chloride 109 101 - 110 mmol/L    CO2 23 21 - 32 mmol/L    Anion Gap 5 4 - 13 mmol/L    Glucose 116 (H) 65 - 100 mg/dL    BUN 10 6 - 23 MG/DL    Creatinine 0.90 0.6 - 1.0 MG/DL    GFR African American >60 >60 ml/min/1.72m    GFR Non-African American >60 >60 ml/min/1.716m   Calcium 9.0 8.3 - 10.4 MG/DL    Total Bilirubin 0.3 0.2 - 1.1 MG/DL    ALT 56 12 - 65 U/L    AST 110 (H) 15 - 37 U/L    Alk Phosphatase 87 50 - 136 U/L    Total Protein 6.7 6.3 - 8.2 g/dL    Albumin 3.2 (L) 3.5 - 5.0 g/dL    Globulin 3.5 2.3 - 3.5 g/dL    Albumin/Globulin Ratio 0.9 (L) 1.2 - 3.5     Procalcitonin    Collection Time: 01/26/21  5:02 AM   Result Value Ref Range    Procalcitonin 1.10 (H) 0.00 - 0.49 ng/mL   Magnesium    Collection Time: 01/26/21  5:02 AM   Result Value Ref Range    Magnesium 1.5 (L) 1.8 - 2.4 mg/dL   Phosphorus    Collection Time: 01/26/21  5:02 AM   Result Value Ref Range    Phosphorus 1.4 (L) 2.5 - 4.5 MG/DL   Rapid influenza A/B antigens    Collection Time: 01/26/21  5:35 AM    Specimen: Nasal Washing   Result Value Ref Range    Influenza A Ag Negative NEG      Influenza B Ag Negative NEG      Source Nasopharyngeal     COVID-19, Rapid    Collection Time: 01/26/21  5:35 AM    Specimen: Nasopharyngeal   Result Value Ref Range    Source NASAL      SARS-CoV-2, Rapid Not  detected NOTD     Lactic Acid    Collection Time: 01/26/21  6:15 AM   Result Value Ref Range    Lactic Acid, Plasma 4.0 (HH) 0.4 - 2.0 MMOL/L   POCT Urinalysis no  Micro    Collection Time: 01/26/21  6:34 AM   Result Value Ref Range    Specific Gravity, Urine, POC <=1.005 1.001 - 1.023      pH, Urine, POC 5.5 5.0 - 9.0      Protein, Urine, POC Negative NEG mg/dL    Glucose, UA POC Negative NEG mg/dL    Ketones, Urine, POC Negative NEG mg/dL    Bilirubin, Urine, POC Negative NEG      Blood, UA POC Negative NEG      URINE UROBILINOGEN POC 0.2 0.2 - 1.0 EU/dL    Nitrate, Urine, POC Negative NEG      Leukocyte Est, UA POC Negative NEG      Performed by: [employer]    Lactic Acid    Collection Time: 01/26/21 10:05 AM   Result Value Ref Range    Lactic Acid, Plasma 3.9 (HH) 0.4 - 2.0 MMOL/L       I have personally reviewed imaging studies showing:  CT ABDOMEN PELVIS W IV CONTRAST Additional Contrast? Oral    Result Date: 01/26/2021  CT of the Abdomen and Pelvis with contrast CLINICAL INDICATION:  Acute on chronic moderate vomiting and generalized abdominal pain, gastroparesis, intermittent fevers since yesterday, subacute diarrhea.  History of cholecystectomy, appendectomy, autism, depression, GERD, weight loss. COMPARISON: 11/08/2020, 10/22/2020, 09/07/2020. TECHNIQUE: Dose reduction technique used: Automated exposure Control and/or adjustment of mA and kV according to patient size. Multiple axial images were obtained through the abdomen and pelvis after intravenous injection of 125cc of isovue 370 IV contrast to further evaluate vessels and organs. Coronal reformatted images were done for further evaluation of bones and organs. Oral contrast was given for further evaluation of GI tract and adjacent organs. FINDINGS: Partially included lung bases demonstrate trace to small posterior layering pleural effusions bilaterally. Abdomen:  There are no suspicious lesions in the liver or spleen. Cholecystectomy noted without  obvious complication..  The adrenal glands and pancreas appear unremarkable.  There is normal enhancement of the kidneys, no hydronephrosis.  No lymphadenopathy. No free air, focal inflammatory changes or fluid collections in the abdomen. Aorta normal caliber. There is no evidence of bowel obstruction. Short segment jejunal intussusception noted in left upper abdomen (axial 32, coronal 50).  Multiple nonspecific borderline prominent small bowel loops elsewhere, possibly enteritis.  Oral contrast has only reached the level of the distal stomach, limiting GI tract evaluation throughout.  Consider follow-up GI evaluation and/or direct visualization if persisting clinical concern and indication.  Mildly-moderately prominent stool volume in large bowel. Pelvis:  No acute inflammatory changes or fluid collections in the pelvis.  No lymphadenopathy or mass.  Intrauterine device in place.  Small volume nonspecific pelvic free fluid which could be physiologic given patient age. Bones: No acute osseous lesions.     1.  Trace to small pleural effusions in both lung bases. 2.  Left upper abdominal small bowel intussusception, most often a transient and self-limited process. 3.  Cholecystectomy.     XR CHEST PORTABLE    Result Date: 01/26/2021  EXAM: Chest x-ray. INDICATION: Fever. COMPARISON: Prior chest x-ray on December 11, 2015. TECHNIQUE: Single frontal view. FINDINGS: The lungs are clear. The cardiac size, mediastinal contour and pulmonary vasculature are normal. No pneumothorax or pleural effusion is seen. The bones are intact.     Normal chest x-ray.      Echocardiogram:  No results found for this or any previous visit.        Orders Placed This  Encounter   Medications    DISCONTD: lactated ringers bolus    DISCONTD: piperacillin-tazobactam (ZOSYN) 3,375 mg in sodium chloride 0.9 % 50 mL IVPB (mini-bag)     Order Specific Question:   Antimicrobial Indications     Answer:   Sepsis of Unknown Etiology    LORazepam (ATIVAN)  injection 1 mg    piperacillin-tazobactam (ZOSYN) 4,500 mg in sodium chloride 0.9 % 100 mL IVPB (mini-bag)     Order Specific Question:   Antimicrobial Indications     Answer:   Sepsis of Unknown Etiology    ketorolac (TORADOL) injection 15 mg    lactated ringers bolus    promethazine (PHENERGAN) injection 25 mg    diatrizoate meglumine-sodium (GASTROGRAFIN) 66-10 % solution 15 mL    0.9 % sodium chloride bolus    sodium chloride flush 0.9 % injection 10 mL    iopamidol (ISOVUE-370) 76 % injection 100 mL    potassium chloride 10 mEq/100 mL IVPB (Peripheral Line)    magnesium sulfate 2000 mg in 50 mL IVPB premix    famotidine (PEPCID) 20 mg in sodium chloride (PF) 10 mL injection    OLANZapine (ZYPREXA) tablet 5 mg    DISCONTD: sertraline (ZOLOFT) tablet 75 mg    sodium chloride flush 0.9 % injection 5-40 mL    sodium chloride flush 0.9 % injection 5-40 mL    0.9 % sodium chloride infusion    enoxaparin (LOVENOX) injection 40 mg     Order Specific Question:   Indication of Use     Answer:   Prophylaxis-DVT/PE    OR Linked Order Group     ondansetron (ZOFRAN-ODT) disintegrating tablet 4 mg     ondansetron (ZOFRAN) injection 4 mg    OR Linked Order Group     acetaminophen (TYLENOL) tablet 650 mg     acetaminophen (TYLENOL) suppository 650 mg    HYDROmorphone HCl PF (DILAUDID) injection 0.25 mg    0.9 % sodium chloride infusion    piperacillin-tazobactam (ZOSYN) 3,375 mg in sodium chloride 0.9 % 50 mL IVPB (mini-bag)     Order Specific Question:   Antimicrobial Indications     Answer:   Sepsis of Unknown Etiology     Order Specific Question:   Sepsis duration of therapy     Answer:   7 days    vancomycin (VANCOCIN) 1,000 mg in sodium chloride 0.9 % 250 mL IVPB (Vial2Bag)     Order Specific Question:   Antimicrobial Indications     Answer:   Sepsis of Unknown Etiology     Order Specific Question:   Sepsis duration of therapy     Answer:   7 days    vancomycin (VANCOCIN) 750 mg in sodium chloride 0.9 % 250 mL IVPB  (Vial2Bag)     Order Specific Question:   Antimicrobial Indications     Answer:   Sepsis of Unknown Etiology     Order Specific Question:   Sepsis duration of therapy     Answer:   7 days    potassium phosphate 20 mmol in sodium chloride 0.9 % 250 mL IVPB    LORazepam (ATIVAN) injection 0.5 mg    sertraline (ZOLOFT) tablet 50 mg    Prucalopride Succinate TABS 2 mg (Patient Supplied)     Order Specific Question:   Brand Name     Answer:   motegrity     Order Specific Question:   Generic Name     Answer:   motegrity  Order Specific Question:   Form     Answer:   Tab [55]     Order Specific Question:   Length of Therapy     Answer:   lifetime     Order Specific Question:   Reason for Non-Formulary Order     Answer:   gastroparesis     Order Specific Question:   How Soon Needed? (normally takes 72 hrs. to procure)     Answer:   now    promethazine (PHENERGAN) tablet 12.5 mg         Signed:  Jacqlyn Krauss, MD    Part of this note may have been written by using a voice dictation software.  The note has been proof read but may still contain some grammatical/other typographical errors.

## 2021-01-27 ENCOUNTER — Inpatient Hospital Stay: Admit: 2021-01-27 | Payer: BLUE CROSS/BLUE SHIELD

## 2021-01-27 LAB — COMPREHENSIVE METABOLIC PANEL W/ REFLEX TO MG FOR LOW K
ALT: 125 U/L — ABNORMAL HIGH (ref 12–65)
AST: 163 U/L — ABNORMAL HIGH (ref 15–37)
Albumin/Globulin Ratio: 1 — ABNORMAL LOW (ref 1.2–3.5)
Albumin: 2.1 g/dL — ABNORMAL LOW (ref 3.5–5.0)
Alk Phosphatase: 110 U/L (ref 50–136)
Anion Gap: 6 mmol/L (ref 4–13)
BUN: 9 MG/DL (ref 6–23)
CO2: 22 mmol/L (ref 21–32)
Calcium: 8 MG/DL — ABNORMAL LOW (ref 8.3–10.4)
Chloride: 113 mmol/L — ABNORMAL HIGH (ref 101–110)
Creatinine: 0.7 MG/DL (ref 0.6–1.0)
GFR African American: >60 mL/min/{1.73_m2} (ref >60)
GFR Non-African American: >60 mL/min/{1.73_m2} (ref >60)
Globulin: 2.2 g/dL — ABNORMAL LOW (ref 2.3–3.5)
Glucose: 90 mg/dL (ref 65–100)
Potassium: 3.7 mmol/L (ref 3.5–5.1)
Sodium: 141 mmol/L (ref 136–145)
Total Bilirubin: 2.6 MG/DL — ABNORMAL HIGH (ref 0.2–1.1)
Total Protein: 4.3 g/dL — ABNORMAL LOW (ref 6.3–8.2)

## 2021-01-27 LAB — CBC WITH AUTO DIFFERENTIAL
Absolute Eos #: 0 10*3/uL (ref 0.0–0.8)
Absolute Immature Granulocyte: 0 10*3/uL (ref 0.0–0.5)
Absolute Lymph #: 0.4 10*3/uL — ABNORMAL LOW (ref 0.5–4.6)
Absolute Mono #: 0.1 10*3/uL (ref 0.1–1.3)
Basophils Absolute: 0 10*3/uL (ref 0.0–0.2)
Basophils: 1 % (ref 0.0–2.0)
Eosinophils %: 0 % — ABNORMAL LOW (ref 0.5–7.8)
Hematocrit: 32.6 % — ABNORMAL LOW (ref 35.8–46.3)
Hemoglobin: 10.8 g/dL — ABNORMAL LOW (ref 11.7–15.4)
Immature Granulocytes: 1 % (ref 0.0–5.0)
Lymphocytes: 10 % — ABNORMAL LOW (ref 13–44)
MCH: 30.1 PG (ref 26.1–32.9)
MCHC: 33.1 g/dL (ref 31.4–35.0)
MCV: 90.8 FL (ref 79.6–97.8)
MPV: 10.1 FL (ref 9.4–12.3)
Monocytes: 3 % — ABNORMAL LOW (ref 4.0–12.0)
Platelets: 148 10*3/uL — ABNORMAL LOW (ref 150–450)
RBC: 3.59 M/uL — ABNORMAL LOW (ref 4.05–5.2)
RDW: 13.1 % (ref 11.9–14.6)
Seg Neutrophils: 85 % — ABNORMAL HIGH (ref 43–78)
Segs Absolute: 3.7 10*3/uL (ref 1.7–8.2)
WBC: 4.2 10*3/uL — ABNORMAL LOW (ref 4.3–11.1)
nRBC: 0 10*3/uL (ref 0.0–0.2)

## 2021-01-27 LAB — PREALBUMIN: Prealbumin: 10.9 MG/DL — ABNORMAL LOW (ref 18.0–35.7)

## 2021-01-27 LAB — C-REACTIVE PROTEIN: CRP: 13 mg/dL — ABNORMAL HIGH (ref 0.0–0.9)

## 2021-01-27 LAB — PHOSPHORUS: Phosphorus: 2 MG/DL — ABNORMAL LOW (ref 2.5–4.5)

## 2021-01-27 LAB — LACTIC ACID
Lactic Acid, Plasma: 4.1 MMOL/L (ref 0.4–2.0)
Lactic Acid, Plasma: 2.9 MMOL/L — ABNORMAL HIGH (ref 0.4–2.0)
Lactic Acid, Plasma: 1.9 MMOL/L (ref 0.4–2.0)

## 2021-01-27 LAB — MAGNESIUM: Magnesium: 1.8 mg/dL (ref 1.8–2.4)

## 2021-01-27 LAB — PROCALCITONIN: Procalcitonin: 0.68 ng/mL — ABNORMAL HIGH (ref 0.00–0.49)

## 2021-01-27 MED ORDER — NOREPINEPHRINE-SODIUM CHLORIDE 16-0.9 MG/250ML-% IV SOLN
16-0.9250- MG/250ML-% | INTRAVENOUS | Status: DC
Start: 2021-01-27 — End: 2021-01-28

## 2021-01-27 MED ORDER — HYDROCORTISONE NA SUCCINATE PF 100 MG IJ SOLR
100 MG | Freq: Four times a day (QID) | INTRAMUSCULAR | Status: DC
Start: 2021-01-27 — End: 2021-01-28
  Administered 2021-01-27 – 2021-01-28 (×5): 50 mg via INTRAVENOUS

## 2021-01-27 MED ORDER — SODIUM CHLORIDE 0.9 % IV SOLN
0.9 % | INTRAVENOUS | Status: DC
Start: 2021-01-27 — End: 2021-01-27

## 2021-01-27 MED ORDER — SODIUM CHLORIDE 0.9 % IV SOLN
0.9 % | INTRAVENOUS | Status: DC | PRN
Start: 2021-01-27 — End: 2021-01-31

## 2021-01-27 MED ORDER — SODIUM PHOSPHATE 3 MMOL/ML IV SOLN (MIXTURES ONLY)
3 mmol/mL | INTRAVENOUS | Status: DC | PRN
Start: 2021-01-27 — End: 2021-01-31

## 2021-01-27 MED ORDER — SODIUM CHLORIDE (PF) 0.9 % IJ SOLN
0.9 % | Freq: Every day | INTRAMUSCULAR | Status: DC
Start: 2021-01-27 — End: 2021-01-28
  Administered 2021-01-27 – 2021-01-28 (×2): 40 mg via INTRAVENOUS

## 2021-01-27 MED ORDER — POTASSIUM BICARB-CITRIC ACID 20 MEQ PO TBEF
20 MEQ | ORAL | Status: DC | PRN
Start: 2021-01-27 — End: 2021-01-31

## 2021-01-27 MED ORDER — MORPHINE SULFATE 2 MG/ML IJ SOLN
2 MG/ML | INTRAMUSCULAR | Status: DC | PRN
Start: 2021-01-27 — End: 2021-01-28
  Administered 2021-01-27 – 2021-01-28 (×2): 1 mg via INTRAVENOUS

## 2021-01-27 MED ORDER — SODIUM CHLORIDE 0.9 % IV SOLN (MINI-BAG)
0.9 % | Freq: Two times a day (BID) | INTRAVENOUS | Status: DC
Start: 2021-01-27 — End: 2021-01-29
  Administered 2021-01-27 – 2021-01-29 (×5): 100 mg via INTRAVENOUS

## 2021-01-27 MED ORDER — HYDROCODONE-ACETAMINOPHEN 5-325 MG PO TABS
5-325 MG | Freq: Four times a day (QID) | ORAL | Status: DC | PRN
Start: 2021-01-27 — End: 2021-01-28

## 2021-01-27 MED ORDER — POTASSIUM CHLORIDE 10 MEQ/100ML IV SOLN
10 MEQ/0ML | INTRAVENOUS | Status: DC | PRN
Start: 2021-01-27 — End: 2021-01-31

## 2021-01-27 MED ORDER — LACTATED RINGERS IV BOLUS
Freq: Once | INTRAVENOUS | Status: AC
Start: 2021-01-27 — End: 2021-01-27
  Administered 2021-01-27: 06:00:00 1000 mL via INTRAVENOUS

## 2021-01-27 MED ORDER — IBUPROFEN 400 MG PO TABS
400 MG | Freq: Once | ORAL | Status: AC
Start: 2021-01-27 — End: 2021-01-27
  Administered 2021-01-27: 10:00:00 800 mg via ORAL

## 2021-01-27 MED ORDER — MAGNESIUM SULFATE 2000 MG/50 ML IVPB PREMIX
250 GM/50ML | INTRAVENOUS | Status: DC | PRN
Start: 2021-01-27 — End: 2021-01-31

## 2021-01-27 MED ORDER — SODIUM CHLORIDE 0.9 % IV BOLUS
0.9 % | Freq: Once | INTRAVENOUS | Status: AC
Start: 2021-01-27 — End: 2021-01-27
  Administered 2021-01-27: 12:00:00 500 mL via INTRAVENOUS

## 2021-01-27 MED ORDER — BLISTEX MEDICATED EX OINT
CUTANEOUS | Status: DC | PRN
Start: 2021-01-27 — End: 2021-01-31

## 2021-01-27 MED ORDER — METOCLOPRAMIDE HCL 5 MG/ML IJ SOLN
5 MG/ML | Freq: Four times a day (QID) | INTRAMUSCULAR | Status: AC
Start: 2021-01-27 — End: 2021-01-27
  Administered 2021-01-27 – 2021-01-28 (×3): 10 mg via INTRAVENOUS

## 2021-01-27 MED ORDER — POTASSIUM CHLORIDE CRYS ER 20 MEQ PO TBCR
20 MEQ | ORAL | Status: DC | PRN
Start: 2021-01-27 — End: 2021-01-31

## 2021-01-27 MED ORDER — LACTATED RINGERS IV BOLUS
Freq: Once | INTRAVENOUS | Status: AC
Start: 2021-01-27 — End: 2021-01-27
  Administered 2021-01-27: 03:00:00 1551 mL/kg via INTRAVENOUS

## 2021-01-27 MED ORDER — LACTATED RINGERS IV SOLN
INTRAVENOUS | Status: DC
Start: 2021-01-27 — End: 2021-01-27
  Administered 2021-01-27 (×2): via INTRAVENOUS

## 2021-01-27 MED FILL — HYDROMORPHONE HCL 1 MG/ML IJ SOLN: 1 MG/ML | INTRAMUSCULAR | Qty: 1

## 2021-01-27 MED FILL — PIPERACILLIN SOD-TAZOBACTAM SO 3.375 (3-0.375) G IV SOLR: 3.375 (3-0.375) g | INTRAVENOUS | Qty: 3375

## 2021-01-27 MED FILL — DOXY 100 100 MG IV SOLR: 100 MG | INTRAVENOUS | Qty: 100

## 2021-01-27 MED FILL — SODIUM PHOSPHATES 15 MMOLE/5ML IV SOLN: 15 MMOLE/5ML | INTRAVENOUS | Qty: 5

## 2021-01-27 MED FILL — OLANZAPINE 5 MG PO TABS: 5 MG | ORAL | Qty: 1

## 2021-01-27 MED FILL — METOCLOPRAMIDE HCL 5 MG/ML IJ SOLN: 5 MG/ML | INTRAMUSCULAR | Qty: 2

## 2021-01-27 MED FILL — FAMOTIDINE (PF) 20 MG/2ML IV SOLN: 20 MG/2ML | INTRAVENOUS | Qty: 2

## 2021-01-27 MED FILL — LIP-GUARD EX OINT: CUTANEOUS | Qty: 7

## 2021-01-27 MED FILL — ACETAMINOPHEN 325 MG PO TABS: 325 MG | ORAL | Qty: 2

## 2021-01-27 MED FILL — LORAZEPAM 2 MG/ML IJ SOLN: 2 MG/ML | INTRAMUSCULAR | Qty: 1

## 2021-01-27 MED FILL — VANCOMYCIN HCL 750 MG IV SOLR: 750 MG | INTRAVENOUS | Qty: 750

## 2021-01-27 MED FILL — IBUPROFEN 400 MG PO TABS: 400 MG | ORAL | Qty: 2

## 2021-01-27 MED FILL — SOLU-CORTEF 100 MG IJ SOLR: 100 MG | INTRAMUSCULAR | Qty: 2

## 2021-01-27 MED FILL — MORPHINE SULFATE 2 MG/ML IJ SOLN: 2 mg/mL | INTRAMUSCULAR | Qty: 1

## 2021-01-27 MED FILL — PANTOPRAZOLE SODIUM 40 MG IV SOLR: 40 MG | INTRAVENOUS | Qty: 40

## 2021-01-27 MED FILL — PROCHLORPERAZINE EDISYLATE 10 MG/2ML IJ SOLN: 10 MG/2ML | INTRAMUSCULAR | Qty: 2

## 2021-01-27 NOTE — Consults (Signed)
Infectious Disease Consult    Today's Date: 01/27/2021   Admit Date: 01/26/2021    Impression:   SIRS / Sepsis - unclear etiology  Small bowel intussusception   Abnormal LFTs  Abdominal pain  Chronic gastroparesis  Severe protein calorie malnutrition  Autism / ADHD / Depression  H/o cholecystectomy, appendectomy, 09/2020    Plan:   Unclear etiology of the current cause of sepsis.  Possible causes include bacterial translocation related to the use of a peripheral IV at home or possibly due to the mild intussusception seen on CT scan.  Given that she has thrombocytopenia and elevated liver function tests today, we will empirically treat for tickborne disease with doxycycline given her ongoing animal exposures.  Likely plan to stop vancomycin tomorrow if no clear diagnosis is seen.  If she develops diarrhea, then we would recommend repeating the stool testing from earlier this summer in some form.  It is very difficult to link the current episode to her clinical decline since February.  One possible consideration could be Whipple's disease.  We could consider EGD with duodenal biopsy with PAS staining if we continue to have ongoing difficulties with diagnosis.    Anti-infectives:   IV vanc  IV zosyn    Subjective:   Date of Consultation:  January 27, 2021  Referring Physician: Dr. Simeon Craft    Patient is a 20 y.o. female with h/o chronic gastroparesis with ongoing tertiary GI evaluation who presented to [facility] with abdominal pain.  She has a history of autism, but has previously been functioning at a high level and runs a dog grooming business.  She additionally keeps pet rabbits, and has a general fondness for animals.  Her history is complicated over the last several months, beginning with a trip to [place] in December of last year.  Per reports, the patient's family had traveler's diarrhea, but the patient had persistent right upper quadrant pain.  She has then had difficulties with milk protein allergy and a consideration of  eosinophilic esophagitis.  Her symptoms have progressed to severe nausea and vomiting with a diagnosis of gastroparesis.  She has also had intermittent diarrhea which is occasionally bloody.  She has lost approximately 35 pounds since the winter.  This summer, the patient had stool testing performed that was entirely negative, specifically for infectious pathogens.  More recently, the patient was admitted to [facility] under the care of the adolescent medicine team, in an effort to improve her oral intake.  She was discharged in mid August.  Since that time, she has been at home under the care of of her parents, who are both medical professionals.  She has been receiving IV fluids over the past week through a peripheral IV.  Over the course of the 24 hours leading to hospitalization, the patient developed high fever and worsening abdominal pain.  She was admitted for concern of sepsis.  Evaluation since she has been admitted has included a CT scan of the abdomen and pelvis with contrast which only showed 1 area of small bowel intussusception, and a negative right upper quadrant ultrasound.  Upon admission, the patient was started on vancomycin and Zosyn.  This morning, her labs were concerning for worsening anemia, thrombocytopenia, and elevated LFTs.  She had low blood pressures measured on the floor and has been transferred to the ICU.  ID is consulted for further recommendations.    Patient Active Problem List   Diagnosis    ADHD    Autism  Depression    GERD (gastroesophageal reflux disease)    Weight loss    Sepsis (HCC)    Hypokalemia    Hypophosphatemia    Hypomagnesemia    Gastroparesis    Elevated liver enzymes     Past Medical History:   Diagnosis Date    ADHD     managed with meds    Autism     Depression     GERD (gastroesophageal reflux disease)     Weight loss       Family History   Problem Relation Age of Onset    Cancer Mother         bilateral breast cancer    No Known Problems  Father     Hypertension Mother       Social History     Tobacco Use    Smoking status: Never    Smokeless tobacco: Never   Substance Use Topics    Alcohol use: Not Currently     Past Surgical History:   Procedure Laterality Date    CHOLECYSTECTOMY, LAPAROSCOPIC N/A 10/05/2020    CHOLECYSTECTOMY LAPAROSCOPIC performed by Azalia Bilis, MD at [place] N/A 09/17/2020    COLONOSCOPY/BMI 21 performed by Flint Melter, MD at [facility]    COLONOSCOPY N/A 01/14/2019    COLONOSCOPY/  BMI 21 PT IS AUTISTIC performed by Flint Melter, MD at [place] Bilateral     as a baby    LAPAROSCOPIC APPENDECTOMY N/A 10/05/2020    APPENDECTOMY LAPAROSCOPIC performed by Azalia Bilis, MD at [place]        Prior to Admission medications    Medication Sig Start Date End Date Taking? Authorizing Provider   guanFACINE (INTUNIV) 1 MG TB24 extended release tablet Take 1 mg by mouth daily.    Historical Provider, MD   guanFACINE (INTUNIV) 4 MG TB24 extended release tablet Take 4 mg by mouth daily.    Historical Provider, MD   OLANZapine (ZYPREXA) 2.5 MG tablet Take 5 mg by mouth nightly    Historical Provider, MD   prochlorperazine (COMPAZINE) 10 MG tablet Take 5 mg by mouth every 6 hours as needed (nausea).    Historical Provider, MD   acetaminophen (TYLENOL) 500 MG tablet Take 500 mg by mouth every 6 hours as needed for Pain (breakthrough pain).    Historical Provider, MD   EPINEPHrine 0.3 MG/0.3ML SOSY Inject 0.3 mg into the muscle 1 (one) time if needed (allergic reaction).    Historical Provider, MD   promethazine (PHENERGAN) 25 MG tablet Take 25 mg by mouth every 6 hours as needed for Nausea    Historical Provider, MD   ondansetron (ZOFRAN-ODT) 8 MG TBDP disintegrating tablet Place 1 tablet under the tongue every 8 hours as needed for Nausea or Vomiting 10/05/20   Azalia Bilis, MD   VYVANSE 60 MG CHEW  09/12/20   Historical Provider, MD    guanFACINE (INTUNIV) 1 MG TB24 extended release tablet Take 1 mg by mouth daily  07/11/20   Historical Provider, MD   Sertraline HCl (ZOLOFT PO) Take 75 mg by mouth daily    Historical Provider, MD   Famotidine (PEPCID PO) Take 10 mg by mouth daily    Historical Provider, MD   levonorgestrel (MIRENA, 52 MG,) IUD 52 mg 1 each by IntraUTERine route once    Historical Provider, MD  Allergies   Allergen Reactions    Cow's Milk Anaphylaxis and Hives    Food Anaphylaxis    Lac Bovis Anaphylaxis    Albumen, Egg Other (See Comments)     No longer allergic      Dog Epithelium Allergy Skin Test Itching    Other Itching        Review of Systems:  A comprehensive review of systems is negative except as stated in the HPI.      Objective:     Visit Vitals  BP 115/60   Pulse (!) 108   Temp 98.9 ??F (37.2 ??C) (Oral)   Resp 16   Ht '5\' 5"'$  (1.651 m)   Wt 114 lb (51.7 kg)   SpO2 94%   BMI 18.97 kg/m??     Temp (24hrs), Avg:100.8 ??F (38.2 ??C), Min:98.9 ??F (37.2 ??C), Max:102.7 ??F (39.3 ??C)       Lines:  Peripheral IV:       Physical Exam:    General:  Alert, cooperative but sleepy   Eyes:  Sclera anicteric. Peri-orbital edema   Mouth/Throat: Mucous membranes normal   Neck: Supple   Lungs:   Clear to auscultation bilaterally, good effort   CV:  Regular rate and rhythm,no murmur, click, rub or gallop   Abdomen:   Soft, non-tender to auscultation and mild palpation. bowel sounds normal. non-distended   Extremities: No cyanosis; moderate extremity edema   Skin: Skin color, texture, turgor normal. no acute rash or lesions   Neuro: Responsive, moves all extremities   Musculoskeletal: No swelling or deformity   Lines/Devices:  Intact, no erythema, drainage or tenderness   Psych: Alert and oriented, normal mood affect given the setting       Data Review:     CBC:  Recent Labs     01/26/21  0502 01/27/21  0731   WBC 11.8* 4.2*   HGB 12.7 10.8*   HCT 38.6 32.6*   PLT 230 148*       BMP:  Recent Labs     01/26/21  0502 01/26/21  1634  01/27/21  0731   BUN '10 12 9   '$ NA 137 140 141   K 3.0* 3.5 3.7   CL 109 109 113*   CO2 '23 24 22       '$ LFTS:  Recent Labs     01/26/21  0502 01/27/21  0731   ALT 56 125*       Microbiology:   Reviewed    Imaging:   01/27/21 [place] Abd:   Impression   Unremarkable right upper quadrant ultrasound     01/26/21 CT abd:   Impression   1.  Trace to small pleural effusions in both lung bases.   2.  Left upper abdominal small bowel intussusception, most often a transient and   self-limited process.   3.  Cholecystectomy.       Signed By: Lestine Box, MD     January 27, 2021

## 2021-01-27 NOTE — Plan of Care (Signed)
Problem: Discharge Planning  Goal: Discharge to home or other facility with appropriate resources  Outcome: Progressing  Flowsheets  Taken 01/26/2021 1912 by Marlana Latus, RN  Discharge to home or other facility with appropriate resources:   Identify barriers to discharge with patient and caregiver   Identify discharge learning needs (meds, wound care, etc)  Taken 01/26/2021 1455 by Lorin Glass. Siney, RN  Discharge to home or other facility with appropriate resources: Identify barriers to discharge with patient and caregiver     Problem: Pain  Goal: Verbalizes/displays adequate comfort level or baseline comfort level  Outcome: Progressing  Flowsheets  Taken 01/27/2021 0048  Verbalizes/displays adequate comfort level or baseline comfort level:   Encourage patient to monitor pain and request assistance   Assess pain using appropriate pain scale   Implement non-pharmacological measures as appropriate and evaluate response  Taken 01/26/2021 2140  Verbalizes/displays adequate comfort level or baseline comfort level:   Encourage patient to monitor pain and request assistance   Assess pain using appropriate pain scale   Administer analgesics based on type and severity of pain and evaluate response     Problem: Safety - Adult  Goal: Free from fall injury  Outcome: Progressing  Flowsheets (Taken 01/26/2021 1912)  Free From Fall Injury: Instruct family/caregiver on patient safety

## 2021-01-27 NOTE — Progress Notes (Signed)
Admit Date: 01/26/2021  Hospital day 2    Subjective:     Patient still reports right sided abdominal pain all along the flank and right lower quadrant.  Pain is constant and dull.  She reports no bowel movements, no diarrhea.  She reports no nausea or vomiting.    Scheduled Meds:   pantoprazole (PROTONIX) 40 mg injection  40 mg IntraVENous Daily    hydrocortisone sodium succinate PF  50 mg IntraVENous Q6H    metoclopramide  10 mg IntraVENous Q6H    sodium chloride flush  5-40 mL IntraVENous 2 times per day    piperacillin-tazobactam  3,375 mg IntraVENous q8h    vancomycin  750 mg IntraVENous Q12H     Continuous Infusions:   lactated ringers 125 mL/hr at 01/27/21 0901    norepinephrine      sodium chloride       PRN Meds:sodium chloride, sodium chloride flush, sodium chloride flush, sodium chloride, ondansetron **OR** ondansetron, acetaminophen **OR** acetaminophen    Review of Systems  Pertinent items are noted in HPI.    Objective:     Patient Vitals for the past 8 hrs:   BP Temp Temp src Pulse Resp SpO2   01/27/21 0900 (!) 94/47 -- -- (!) 119 15 --   01/27/21 0706 (!) 91/52 98.9 ??F (37.2 ??C) Oral (!) 136 18 94 %   01/27/21 0400 (!) 108/58 (!) 101.7 ??F (38.7 ??C) Oral (!) 132 18 --     I/O last 3 completed shifts:  In: 4553.6 [P.O.:120; I.V.:4433.6]  Out: 1100 [Urine:1100]  No intake/output data recorded.    WDWN in NAD  Alert and oriented x 3  Eyes: PERRL, sclerae white  ENT: external inspection of ears and nose normal, oropharynx normal  Abdomen: soft, on the right side all the way from the upper quadrant to the lower quadrant with no percussion or rebound tenderness  Musculoskeletal: gait normal, digits without clubbing or cyanosis  Neurological: CN II-XII grossly normal, normal sensation and muscle strength bilaterally       Data ReviewCBC:   Lab Results   Component Value Date/Time    WBC 4.2 01/27/2021 07:31 AM    RBC 3.59 01/27/2021 07:31 AM     BMP:   Lab Results   Component Value Date/Time    GLUCOSE 90  01/27/2021 07:31 AM    CO2 22 01/27/2021 07:31 AM    BUN 9 01/27/2021 07:31 AM    CREATININE 0.70 01/27/2021 07:31 AM    CALCIUM 8.0 01/27/2021 07:31 AM       Assessment:     Principal Problem:    Sepsis ([place])  Active Problems:    Hypokalemia    Hypophosphatemia    Hypomagnesemia    Gastroparesis    Elevated liver enzymes    Autism    Depression    Weight loss  Resolved Problems:    * No resolved hospital problems. *      Plan:     Patient has abdominal pain of uncertain etiology.  I do not see any clear-cut indications for surgical intervention she has had multiple studies which I again reviewed including CT enteroscopy.  She may benefit from a CTA to rule out ischemia but at this point there is no indication for any surgical intervention.    We will continue to follow closely

## 2021-01-27 NOTE — Consults (Signed)
Comprehensive Nutrition Assessment    Type and Reason for Visit: Initial, Consult  General Nutrition Management/other reason (Hospitalists)    Nutrition Recommendations/Plan:   Meals and Snacks:  Diet: Continue current order  Progression per MD.  Clear order modified for vegan, popsicles and gelatin only per preferences.   Nutrition Supplement Therapy:  Medical food supplement therapy:  None no appropriate ONS on formulary compatible with food allergen.   Pt is not appropriate for PN infusion, she may benefit from addition of albumin given third spacing.  If EN is pursued pt would likely require G/J for primary needs and would require Dillard Essex or similar formula secondary to food allergen.  Given pt has not yet been able to try motegrity per [facility] recommendation, ultimately she may best be served by pursuing this treatment once sepsis resolved to determine if this avenue will assist her with consuming oral intake.    Nutrition Related Medication Management:  Electrolyte replacement protocol implemented.  Discussed with Corene Cornea, RN need for phos replacement once IV access free for infusion.      Malnutrition Assessment:  Malnutrition Status: Insufficient data  NFPE deferred as pt sleeping, +fluid resuscitation and edema.    Wt hx per chart review: Pediatric office: 125# 10/21/19, 123# 10/24/19, 112# 10/09/20.  113# 10/24/20 [facility] office.    Nutrition Assessment:  Nutrition History: Hx per mom at bedside.  Pt with hx of anaphylactic milk allergy and EoE.  Family is vegan.  Pt with ER presentation in February x 2 secondary accidental allergy ingestion with product changes.  Following this presentation pt presented with increasing GI difficulties that have persisted.  Pt with varied po intake at times accepts foods or liquids or neither.  Home IV infusions recently started after admission to [facility] for electrolyte repletion.  Infusions were determined by daily intake.    Idiopathic gastroparesis diagnosis at [facility] recommended pt only received first does yesterday.  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   "Weight trend: Loss (unitentional) 30-40 lbs. within 1-6 months. 31# in past 4 months" per [facility] RD assessment 11/23/20 on this encounter wt is recorded as ?104#.      Do You Have Any Cultural, Religious, or Ethnic Food Preferences?: No   Nutrition Background:   History of  highly functional autism, depression, chronic gastroparesis followed by [facility], who presented with nausea, emesis and abdominal pain.  Admitted with septic shock.       Nutrition Interval:  NPO/CLD day 1.  Transferred to ICU this am at initial attempted RD attempt secondary to fluid status and BP.  Sleeping at RD visit.  Surgery, GI and ID following.    Abdominal Status (last documented):    , GI Symptoms: Cramping, Nausea   Pertinent Medications: doxycycline, solu-cortef, reglan, protonix, zosyn, vanco  IVF: LR @ 125 ml/hr  Electrolyte Replacement:   Pertinent Labs:   Lab Results   Component Value Date/Time    NA 141 01/27/2021 07:31 AM    K 3.7 01/27/2021 07:31 AM    CL 113 01/27/2021 07:31 AM    CO2 22 01/27/2021 07:31 AM    BUN 9 01/27/2021 07:31 AM    CREATININE 0.70 01/27/2021 07:31 AM    GLUCOSE 90 01/27/2021 07:31 AM    CALCIUM 8.0 01/27/2021 07:31 AM    PHOS 2.0 01/27/2021 07:31 AM    MG 1.8 01/27/2021 07:31 AM      Remarkable for increased Cl, glucose stable, phos remains  depleted.    Current Nutrition Therapies:  ADULT DIET; Clear Liquid    Current Intake:   Average Meal Intake:  (advanced to clears earlier, no intake thus far)        Anthropometric Measures:  Height: '5\' 5"'$  (165.1 cm)  Current Body Wt: 114 lb (51.7 kg), Weight source: Stated  BMI: 19, Normal Weight (BMI 18.5-24.9)  Admission Body Weight: 114 lb (51.7 kg) (stated)  Ideal Body Weight (Kg) (Calculated): 57 kg (125 lbs), 91.2 %  Usual Body Wt:  (135# per Mom), Percent weight change:         Edema:Peripheral Vascular  Peripheral Vascular (WDL):  Exceptions to WDL  Edema: Generalized  Edema Generalized: Trace   BMI Category Normal Weight (BMI 18.5-24.9)  Estimated Daily Nutrient Needs:  Energy (kcal/day): 1291-1551 (25-30) (Kcal/kg Weight used: 51.7 kg Admission  Protein (g/day): 52-62 (1-1.2 g/kg) Weight Used: (Admission) 51.7 kg  Fluid (ml/day):   (1 ml/kcal)    Nutrition Diagnosis:   Inadequate oral intake related to altered GI function as evidenced by NPO or clear liquid status due to medical condition  Nutrition Interventions:   Food and/or Nutrient Delivery: Continue Current Diet     Coordination of Nutrition Care: Continue to monitor while inpatient       Goals:      Active Goal:  (Tolerate diet progression within 3 days)       Nutrition Monitoring and Evaluation:      Food/Nutrient Intake Outcomes: Diet Advancement/Tolerance  Physical Signs/Symptoms Outcomes: Biochemical Data, GI Status, Fluid Status or Edema, Weight, Meal Time Behavior    Discharge Planning:    Too soon to determine    Rileigh Kawashima T, RD

## 2021-01-27 NOTE — Progress Notes (Signed)
Pt arrived from 2nd floor on room air - mother at bedside. A&O to baseline (autistic - very functional, has her own dog-grooming business). Skin clean, dry, and intact but bruising to L antecubital site from IV sticks. Pt BP marginal on the floor and levophed ordered, but full-sized adult cuff on pt. Small cuff appropriately fit to pt and placed, with stable BP in the [id number] systolic. Afebrile, HR in the 80's - 90's NSR.

## 2021-01-27 NOTE — Progress Notes (Signed)
To visit with the family at the bedside.  Patient is resting peacefully.   Assured family of our on going support

## 2021-01-27 NOTE — Progress Notes (Signed)
[facility] Progress Note         Admit Date:  01/26/2021    Today's Date:  01/27/2021    CC:  RUQ pain    Subjective:     Patient was asleep and was not awakened.  Had hypotension during night along with fever.    Medications:   Current Facility-Administered Medications   Medication Dose Route Frequency    pantoprazole (PROTONIX) 40 mg in sodium chloride (PF) 10 mL injection  40 mg IntraVENous Daily    hydrocortisone sodium succinate PF (SOLU-CORTEF) injection 50 mg  50 mg IntraVENous Q6H    metoclopramide (REGLAN) injection 10 mg  10 mg IntraVENous Q6H    lactated ringers infusion   IntraVENous Continuous    norepinephrine (LEVOPHED) 16 mg in sodium chloride 0.9 % 250 mL infusion  1-100 mcg/min IntraVENous Continuous    0.9 % sodium chloride bolus  100 mL IntraVENous ONCE PRN    sodium chloride flush 0.9 % injection 10 mL  10 mL IntraVENous ONCE PRN    sodium chloride flush 0.9 % injection 5-40 mL  5-40 mL IntraVENous 2 times per day    sodium chloride flush 0.9 % injection 5-40 mL  5-40 mL IntraVENous PRN    0.9 % sodium chloride infusion   IntraVENous PRN    ondansetron (ZOFRAN-ODT) disintegrating tablet 4 mg  4 mg Oral Q8H PRN    Or    ondansetron (ZOFRAN) injection 4 mg  4 mg IntraVENous Q6H PRN    acetaminophen (TYLENOL) tablet 650 mg  650 mg Oral Q6H PRN    Or    acetaminophen (TYLENOL) suppository 650 mg  650 mg Rectal Q6H PRN    piperacillin-tazobactam (ZOSYN) 3,375 mg in sodium chloride 0.9 % 50 mL IVPB (mini-bag)  3,375 mg IntraVENous q8h    vancomycin (VANCOCIN) 750 mg in sodium chloride 0.9 % 250 mL IVPB (Vial2Bag)  750 mg IntraVENous Q12H       Review of Systems:  ROS was obtained, with pertinent positives as listed above.  No chest pain or SOB.    Diet:  NPO    Objective:   Vitals:  BP (!) 91/52    Pulse (!) 136    Temp 98.9 ??F (37.2 ??C) (Oral)    Resp 18    Ht 5\' 5"  (1.651 m)    Wt 114 lb (51.7 kg)    SpO2 94%    BMI 18.97 kg/m??   Intake/Output:  No intake/output data recorded.  09/16  1901 - 09/18 0700  In: 4553.6 [P.O.:120; I.V.:4433.6]  Out: 1100 [Urine:1100]  Exam:  General appearance: no distress      Data Review (Labs):    Recent Labs     01/26/21  0502 01/26/21  1634 01/27/21  0731   WBC 11.8*  --  4.2*   HGB 12.7  --  10.8*   HCT 38.6  --  32.6*   PLT 230  --  148*   MCV 91.9  --  90.8   NA 137 140 141   K 3.0* 3.5 3.7   CL 109 109 113*   CO2 23 24 22    BUN 10 12 9    CREATININE 0.90 0.70 0.70   CALCIUM 9.0 8.2* 8.0*   MG 1.5* 2.0 1.8   PHOS 1.4* 2.0* 2.0*   GLUCOSE 116* 94 90   ALKPHOS 87  --  110   AST 110*  --  163*   ALT 56  --  125*   BILITOT 0.3  --  2.6*   LABALBU 3.2*  --  2.1*   PROT 6.7  --  4.3*       Assessment:     Principal Problem:    Sepsis (HCC)  Active Problems:    Hypokalemia    Hypophosphatemia    Hypomagnesemia    Gastroparesis    Elevated liver enzymes    Autism    Depression    Weight loss  Resolved Problems:    * No resolved hospital problems. *    Sepsis with hypotension, now with elevated transaminases and bilirubin.  Ccx in past for symptoms, no stones.  Cholangitis is a concern, although risk would seem to be low without h/o stones.  Drop in all CBC lines is probably dilutional.    Plan:     Obtain RUQ U/S.  If she has dilated CBD, this would be a change from the CT, and I would have a low threshold for ERCP.  If CBD is normal, the lab changes could simply be due to sepsis.

## 2021-01-27 NOTE — Progress Notes (Signed)
Briefly spoke with the nurse about the patient and her family

## 2021-01-27 NOTE — Progress Notes (Signed)
Hospitalist   Admit Date:  01/26/2021  5:01 AM   Name:  Robin Mendoza   Age:  20 y.o.  Sex:  female  DOB:  [birth year]   MRN:  [id number]   Room:  3103/01    History of Present Illness:      20 y.o. female with medical history of  highly functional autism, depression, chronic gastroparesis followed by [facility], who presented with nausea, emesis and abdominal pain.  Mother Dr. [name] is at bedside and relates history.   She has had a local GI workup s/op EGD/ colonoscopy. Admitted to [facility] for electrolyte issues. Seen at [facility] and recommending motegrity with the diagnosis of idiopathic gastroparesis.   She has not started the motegrity yet but has this on hand.  Continues to have nausea/ emesis abdominal pain. Poor oral intake. No longer able to function well/ stopped working.   Has been getting IVF at home.   Meets sepsis criteria due to lactic acidosis, fever, tachycardia, hypotension.   S/p vancomycin/ zosyn.   Blood cultures ordered.   CXR negative.   UA negative.   COVID and flu negative.   She has electrolytes that are low and being supplemented.   CTAP shows"     Impression   1.  Trace to small pleural effusions in both lung bases.   2.  Left upper abdominal small bowel intussusception, most often a transient and   self-limited process.   3.  Cholecystectomy.     Today, BP dropping, tachycardic, sleepy, no fever    Assessment & Plan:     1- Septic shock, unclear source, intra-abdominal?. With elevated LFT and mild pancytopenia. CT abdomen is negative for that matter  but had colitis in a recent scan in June.  2- Hypokalemia, Hypophosphatemia, Hypomagnesemia. Replaced   3- Hx of Gastroparesis  4- History of Autism and Depression     Rx:  vancomycin/ zosyn  May DC Vanco soon   Follow cultures  Levophed gtt  Solucortef iv  ICU  ID consulted  Seen by Gi and Gen surgery  NPO, IVF  Reglan iv x3    Dispo: TBD, likely home  Case d/w mother, brother, RN, Surgery    Objective:   Patient Vitals for the  past 24 hrs:   Temp Pulse Resp BP SpO2   01/27/21 1217 -- 87 14 120/67 100 %   01/27/21 1132 98.2 ??F (36.8 ??C) (!) 109 16 (!) 85/45 --   01/27/21 1030 -- (!) 108 16 115/60 --   01/27/21 0900 -- (!) 119 15 (!) 94/47 --   01/27/21 0706 98.9 ??F (37.2 ??C) (!) 136 18 (!) 91/52 94 %   01/27/21 0400 (!) 101.7 ??F (38.7 ??C) (!) 132 18 (!) 108/58 --   01/27/21 0115 (!) 101 ??F (38.3 ??C) (!) 128 18 -- --   01/27/21 0048 -- (!) 126 -- -- --   01/26/21 2322 (!) 102.7 ??F (39.3 ??C) (!) 145 16 (!) 102/46 --   01/26/21 2210 -- -- 18 -- --   01/26/21 2140 -- -- 18 -- --   01/26/21 1912 -- (!) 116 -- -- --   01/26/21 1455 -- (!) 131 -- -- --   01/26/21 1340 99.8 ??F (37.7 ??C) (!) 122 16 (!) 97/44 97 %         Oxygen Therapy  SpO2: 100 %  Pulse via Oximetry: 87 beats per minute  O2 Device: None (Room air)    Estimated body  mass index is 18.97 kg/m?? as calculated from the following:    Height as of this encounter: '5\' 5"'  (1.651 m).    Weight as of this encounter: 114 lb (51.7 kg).    Physical Exam:    Blood pressure 120/67, pulse 87, temperature 98.2 ??F (36.8 ??C), temperature source Oral, resp. rate 14, height '5\' 5"'  (1.651 m), weight 114 lb (51.7 kg), SpO2 100 %.  General:    sleepy  CV:   Regular and tachycardic,.  No m/r/g.  No jugular venous distension. No edema   Lungs:   CTAB.  No wheezing, rhonchi, or rales.  Symmetric expansion. Anterior   Abdomen:   Bowel sounds decreased Soft, diffusely tender more in RLQ, no rigidity or rebound tenderness, nondistended.  Extremities: No cyanosis or clubbing.  No edema  Skin:     No rashes and normal coloration.   Warm and dry.    Neuro:  grossly intact.      I have personally reviewed labs and tests showing:  Recent Labs:  Recent Results (from the past 24 hour(s))   Culture, Blood 1    Collection Time: 01/26/21  1:49 PM    Specimen: Blood   Result Value Ref Range    Special Requests LEFT  Antecubital        Culture NO GROWTH AFTER 16 HOURS     Lactic Acid    Collection Time: 01/26/21  4:34 PM    Result Value Ref Range    Lactic Acid, Plasma 2.9 (H) 0.4 - 2.0 MMOL/L   Basic Metabolic Panel    Collection Time: 01/26/21  4:34 PM   Result Value Ref Range    Sodium 140 136 - 145 mmol/L    Potassium 3.5 3.5 - 5.1 mmol/L    Chloride 109 101 - 110 mmol/L    CO2 24 21 - 32 mmol/L    Anion Gap 7 4 - 13 mmol/L    Glucose 94 65 - 100 mg/dL    BUN 12 6 - 23 MG/DL    Creatinine 0.70 0.6 - 1.0 MG/DL    GFR African American >60 >60 ml/min/1.18m    GFR Non-African American >60 >60 ml/min/1.754m   Calcium 8.2 (L) 8.3 - 10.4 MG/DL   Magnesium    Collection Time: 01/26/21  4:34 PM   Result Value Ref Range    Magnesium 2.0 1.8 - 2.4 mg/dL   Phosphorus    Collection Time: 01/26/21  4:34 PM   Result Value Ref Range    Phosphorus 2.0 (L) 2.5 - 4.5 MG/DL   Lactic Acid    Collection Time: 01/26/21  9:13 PM   Result Value Ref Range    Lactic Acid, Plasma 4.1 (HH) 0.4 - 2.0 MMOL/L   Lactic Acid    Collection Time: 01/27/21 12:25 AM   Result Value Ref Range    Lactic Acid, Plasma 2.9 (H) 0.4 - 2.0 MMOL/L   Comprehensive Metabolic Panel w/ Reflex to MG    Collection Time: 01/27/21  7:31 AM   Result Value Ref Range    Sodium 141 136 - 145 mmol/L    Potassium 3.7 3.5 - 5.1 mmol/L    Chloride 113 (H) 101 - 110 mmol/L    CO2 22 21 - 32 mmol/L    Anion Gap 6 4 - 13 mmol/L    Glucose 90 65 - 100 mg/dL    BUN 9 6 - 23 MG/DL    Creatinine 0.70 0.6 - 1.0  MG/DL    GFR African American >60 >60 ml/min/1.48m    GFR Non-African American >60 >60 ml/min/1.743m   Calcium 8.0 (L) 8.3 - 10.4 MG/DL    Total Bilirubin 2.6 (H) 0.2 - 1.1 MG/DL    ALT 125 (H) 12 - 65 U/L    AST 163 (H) 15 - 37 U/L    Alk Phosphatase 110 50 - 136 U/L    Total Protein 4.3 (L) 6.3 - 8.2 g/dL    Albumin 2.1 (L) 3.5 - 5.0 g/dL    Globulin 2.2 (L) 2.3 - 3.5 g/dL    Albumin/Globulin Ratio 1.0 (L) 1.2 - 3.5     CBC with Auto Differential    Collection Time: 01/27/21  7:31 AM   Result Value Ref Range    WBC 4.2 (L) 4.3 - 11.1 K/uL    RBC 3.59 (L) 4.05 - 5.2 M/uL    Hemoglobin  10.8 (L) 11.7 - 15.4 g/dL    Hematocrit 32.6 (L) 35.8 - 46.3 %    MCV 90.8 79.6 - 97.8 FL    MCH 30.1 26.1 - 32.9 PG    MCHC 33.1 31.4 - 35.0 g/dL    RDW 13.1 11.9 - 14.6 %    Platelets 148 (L) 150 - 450 K/uL    MPV 10.1 9.4 - 12.3 FL    nRBC 0.00 0.0 - 0.2 K/uL    Seg Neutrophils 85 (H) 43 - 78 %    Lymphocytes 10 (L) 13 - 44 %    Monocytes 3 (L) 4.0 - 12.0 %    Eosinophils % 0 (L) 0.5 - 7.8 %    Basophils 1 0.0 - 2.0 %    Immature Granulocytes 1 0.0 - 5.0 %    Segs Absolute 3.7 1.7 - 8.2 K/UL    Absolute Lymph # 0.4 (L) 0.5 - 4.6 K/UL    Absolute Mono # 0.1 0.1 - 1.3 K/UL    Absolute Eos # 0.0 0.0 - 0.8 K/UL    Basophils Absolute 0.0 0.0 - 0.2 K/UL    Absolute Immature Granulocyte 0.0 0.0 - 0.5 K/UL    RBC Comment NORMOCYTIC/NORMOCHROMIC      WBC Comment Result Confirmed By Smear      Platelet Comment SLIGHT      Differential Type AUTOMATED     Magnesium    Collection Time: 01/27/21  7:31 AM   Result Value Ref Range    Magnesium 1.8 1.8 - 2.4 mg/dL   Phosphorus    Collection Time: 01/27/21  7:31 AM   Result Value Ref Range    Phosphorus 2.0 (L) 2.5 - 4.5 MG/DL   Prealbumin    Collection Time: 01/27/21  7:31 AM   Result Value Ref Range    Prealbumin 10.9 (L) 18.0 - 35.7 MG/DL   Lactic Acid    Collection Time: 01/27/21  7:31 AM   Result Value Ref Range    Lactic Acid, Plasma 1.9 0.4 - 2.0 MMOL/L   C-Reactive Protein    Collection Time: 01/27/21  7:31 AM   Result Value Ref Range    CRP 13.0 (H) 0.0 - 0.9 mg/dL   Procalcitonin    Collection Time: 01/27/21  7:31 AM   Result Value Ref Range    Procalcitonin 0.68 (H) 0.00 - 0.49 ng/mL       I have personally reviewed imaging studies showing:  CT ABDOMEN PELVIS W IV CONTRAST Additional Contrast? Oral    Result Date: 01/26/2021  CT of the Abdomen and Pelvis with contrast CLINICAL INDICATION:  Acute on chronic moderate vomiting and generalized abdominal pain, gastroparesis, intermittent fevers since yesterday, subacute diarrhea.  History of cholecystectomy, appendectomy,  autism, depression, GERD, weight loss. COMPARISON: 11/08/2020, 10/22/2020, 09/07/2020. TECHNIQUE: Dose reduction technique used: Automated exposure Control and/or adjustment of mA and kV according to patient size. Multiple axial images were obtained through the abdomen and pelvis after intravenous injection of 125cc of isovue 370 IV contrast to further evaluate vessels and organs. Coronal reformatted images were done for further evaluation of bones and organs. Oral contrast was given for further evaluation of GI tract and adjacent organs. FINDINGS: Partially included lung bases demonstrate trace to small posterior layering pleural effusions bilaterally. Abdomen:  There are no suspicious lesions in the liver or spleen. Cholecystectomy noted without obvious complication..  The adrenal glands and pancreas appear unremarkable.  There is normal enhancement of the kidneys, no hydronephrosis.  No lymphadenopathy. No free air, focal inflammatory changes or fluid collections in the abdomen. Aorta normal caliber. There is no evidence of bowel obstruction. Short segment jejunal intussusception noted in left upper abdomen (axial 32, coronal 50).  Multiple nonspecific borderline prominent small bowel loops elsewhere, possibly enteritis.  Oral contrast has only reached the level of the distal stomach, limiting GI tract evaluation throughout.  Consider follow-up GI evaluation and/or direct visualization if persisting clinical concern and indication.  Mildly-moderately prominent stool volume in large bowel. Pelvis:  No acute inflammatory changes or fluid collections in the pelvis.  No lymphadenopathy or mass.  Intrauterine device in place.  Small volume nonspecific pelvic free fluid which could be physiologic given patient age. Bones: No acute osseous lesions.     1.  Trace to small pleural effusions in both lung bases. 2.  Left upper abdominal small bowel intussusception, most often a transient and self-limited process. 3.   Cholecystectomy.     XR CHEST PORTABLE    Result Date: 01/26/2021  EXAM: Chest x-ray. INDICATION: Fever. COMPARISON: Prior chest x-ray on December 11, 2015. TECHNIQUE: Single frontal view. FINDINGS: The lungs are clear. The cardiac size, mediastinal contour and pulmonary vasculature are normal. No pneumothorax or pleural effusion is seen. The bones are intact.     Normal chest x-ray.      Echocardiogram:  No results found for this or any previous visit.        Orders Placed This Encounter   Medications    DISCONTD: lactated ringers bolus    DISCONTD: piperacillin-tazobactam (ZOSYN) 3,375 mg in sodium chloride 0.9 % 50 mL IVPB (mini-bag)     Order Specific Question:   Antimicrobial Indications     Answer:   Sepsis of Unknown Etiology    LORazepam (ATIVAN) injection 1 mg    piperacillin-tazobactam (ZOSYN) 4,500 mg in sodium chloride 0.9 % 100 mL IVPB (mini-bag)     Order Specific Question:   Antimicrobial Indications     Answer:   Sepsis of Unknown Etiology    ketorolac (TORADOL) injection 15 mg    lactated ringers bolus    promethazine (PHENERGAN) injection 25 mg    diatrizoate meglumine-sodium (GASTROGRAFIN) 66-10 % solution 15 mL    0.9 % sodium chloride bolus    sodium chloride flush 0.9 % injection 10 mL    iopamidol (ISOVUE-370) 76 % injection 100 mL    potassium chloride 10 mEq/100 mL IVPB (Peripheral Line)    magnesium sulfate 2000 mg in 50 mL IVPB premix    DISCONTD: famotidine (PEPCID)  20 mg in sodium chloride (PF) 10 mL injection    DISCONTD: OLANZapine (ZYPREXA) tablet 5 mg    DISCONTD: sertraline (ZOLOFT) tablet 75 mg    sodium chloride flush 0.9 % injection 5-40 mL    sodium chloride flush 0.9 % injection 5-40 mL    0.9 % sodium chloride infusion    DISCONTD: enoxaparin (LOVENOX) injection 40 mg     Order Specific Question:   Indication of Use     Answer:   Prophylaxis-DVT/PE    OR Linked Order Group     ondansetron (ZOFRAN-ODT) disintegrating tablet 4 mg     ondansetron (ZOFRAN) injection 4 mg    OR Linked  Order Group     acetaminophen (TYLENOL) tablet 650 mg     acetaminophen (TYLENOL) suppository 650 mg    DISCONTD: HYDROmorphone HCl PF (DILAUDID) injection 0.25 mg    DISCONTD: 0.9 % sodium chloride infusion    piperacillin-tazobactam (ZOSYN) 3,375 mg in sodium chloride 0.9 % 50 mL IVPB (mini-bag)     Order Specific Question:   Antimicrobial Indications     Answer:   Sepsis of Unknown Etiology     Order Specific Question:   Sepsis duration of therapy     Answer:   7 days    vancomycin (VANCOCIN) 1,000 mg in sodium chloride 0.9 % 250 mL IVPB (Vial2Bag)     Order Specific Question:   Antimicrobial Indications     Answer:   Sepsis of Unknown Etiology     Order Specific Question:   Sepsis duration of therapy     Answer:   7 days    vancomycin (VANCOCIN) 750 mg in sodium chloride 0.9 % 250 mL IVPB (Vial2Bag)     Order Specific Question:   Antimicrobial Indications     Answer:   Sepsis of Unknown Etiology     Order Specific Question:   Sepsis duration of therapy     Answer:   7 days    potassium phosphate 20 mmol in sodium chloride 0.9 % 250 mL IVPB    DISCONTD: LORazepam (ATIVAN) injection 0.5 mg    DISCONTD: sertraline (ZOLOFT) tablet 50 mg    DISCONTD: Prucalopride Succinate TABS 2 mg (Patient Supplied)     Order Specific Question:   Brand Name     Answer:   motegrity     Order Specific Question:   Generic Name     Answer:   motegrity     Order Specific Question:   Form     Answer:   Tab [55]     Order Specific Question:   Length of Therapy     Answer:   lifetime     Order Specific Question:   Reason for Non-Formulary Order     Answer:   gastroparesis     Order Specific Question:   How Soon Needed? (normally takes 72 hrs. to procure)     Answer:   now    DISCONTD: promethazine (PHENERGAN) tablet 12.5 mg    DISCONTD: guanFACINE (INTUNIV) extended release tablet 4 mg (Patient Supplied)    DISCONTD: prochlorperazine (COMPAZINE) injection 10 mg    DISCONTD: HYDROmorphone HCl PF (DILAUDID) injection 0.25 mg    lactated  ringers bolus    lactated ringers bolus    ibuprofen (ADVIL;MOTRIN) tablet 800 mg    0.9 % sodium chloride bolus    pantoprazole (PROTONIX) 40 mg in sodium chloride (PF) 10 mL injection    hydrocortisone sodium succinate PF (SOLU-CORTEF) injection  50 mg    metoclopramide (REGLAN) injection 10 mg    lactated ringers infusion    DISCONTD: norepinephrine (LEVOPHED) 16 mg in sodium chloride 0.9 % 250 mL infusion     PRN SBP <90 CONSTANTLY     Order Specific Question:   Titrate Infusion?     Answer:   Yes     Order Specific Question:   Initial Infusion Dose:     Answer:   5 mcg/min     Order Specific Question:   Goal of Therapy is:     Answer:   MAP greater than 65 mmHg     Order Specific Question:   Contact Provider if:     Answer:   Patient is receiving the maximum dose and is not achieving the goal of therapy    norepinephrine (LEVOPHED) 16 mg in sodium chloride 0.9 % 250 mL infusion     Order Specific Question:   Titrate Infusion?     Answer:   Yes     Order Specific Question:   Initial Infusion Dose:     Answer:   5 mcg/min     Order Specific Question:   Goal of Therapy is:     Answer:   MAP greater than 65 mmHg     Order Specific Question:   Contact Provider if:     Answer:   Patient is receiving the maximum dose and is not achieving the goal of therapy    doxycycline (VIBRAMYCIN) 100 mg in sodium chloride 0.9 % 100 mL IVPB     Order Specific Question:   Antimicrobial Indications     Answer:   Other     Order Specific Question:   Other Abx Indication     Answer:   possible tick disease         Signed:  Fonnie Jarvis, MD

## 2021-01-27 NOTE — Progress Notes (Signed)
I Called at 7:54 pm on 01-26-21 to check on patient and spoke with evening RN, patient sleeping and HR had come down to lower 100s and BP improved to upper 90s and lower 100s, she was resting with brother at bedside.     Chart reviewed today and called and spoke with RN. Febrile overnight with ongoing tachycardia and intermittent hypotension, lactic acid increased, RN states patient requesting compazine, advised to hold off for now, HR in 150s SVT and I advised I would call Dr. Virgina Evener to assess patient asap. I spoke with Dr. Virgina Evener who will go to bedside and assess now.  Jacqlyn Krauss, MD

## 2021-01-27 NOTE — Plan of Care (Signed)
Problem: Pain  Goal: Verbalizes/displays adequate comfort level or baseline comfort level  01/27/2021 0742 by Jonnie Finner, RN  Outcome: Progressing  01/27/2021 0303 by Marlana Latus, RN  Outcome: Progressing  Flowsheets  Taken 01/27/2021 0048  Verbalizes/displays adequate comfort level or baseline comfort level:   Encourage patient to monitor pain and request assistance   Assess pain using appropriate pain scale   Implement non-pharmacological measures as appropriate and evaluate response  Taken 01/26/2021 2140  Verbalizes/displays adequate comfort level or baseline comfort level:   Encourage patient to monitor pain and request assistance   Assess pain using appropriate pain scale   Administer analgesics based on type and severity of pain and evaluate response     Problem: Safety - Adult  Goal: Free from fall injury  01/27/2021 0742 by Jonnie Finner, RN  Outcome: Progressing  Flowsheets (Taken 01/27/2021 0719)  Free From Fall Injury: Instruct family/caregiver on patient safety  01/27/2021 0303 by Marlana Latus, RN  Outcome: Progressing  Flowsheets (Taken 01/26/2021 1912)  Free From Fall Injury: Instruct family/caregiver on patient safety     Problem: Discharge Planning  Goal: Discharge to home or other facility with appropriate resources  01/27/2021 0742 by Jonnie Finner, RN  Outcome: Not Progressing  01/27/2021 0303 by Marlana Latus, RN  Outcome: Progressing  Flowsheets  Taken 01/26/2021 1912 by Marlana Latus, RN  Discharge to home or other facility with appropriate resources:   Identify barriers to discharge with patient and caregiver   Identify discharge learning needs (meds, wound care, etc)  Taken 01/26/2021 1455 by Lorin Glass. Siney, RN  Discharge to home or other facility with appropriate resources: Identify barriers to discharge with patient and caregiver     Problem: Confusion  Goal: Confusion, delirium, dementia, or psychosis is improved or at baseline  Description: INTERVENTIONS:  1. Assess for possible  contributors to thought disturbance, including medications, impaired vision or hearing, underlying metabolic abnormalities, dehydration, psychiatric diagnoses, and notify attending LIP  2. Institute high risk fall precautions, as indicated  3. Provide frequent short contacts to provide reality reorientation, refocusing and direction  4. Decrease environmental stimuli, including noise as appropriate  5. Monitor and intervene to maintain adequate nutrition, hydration, elimination, sleep and activity  6. If unable to ensure safety without constant attention obtain sitter and review sitter guidelines with assigned personnel  7. Initiate Psychosocial CNS and Spiritual Care consult, as indicated  Outcome: Not Progressing

## 2021-01-27 NOTE — Progress Notes (Signed)
0530: Updated Dr. Kathi Ludwig on pt status. HR 150's SVT per monitor room. Pt converted back to sinus tachy in 120s at 0535. Per MD to hold compazine since pt has been more lethargic throughout shift. New order acknowledged to increase NS to 158m/hr. Dr. DRosana Hoesalso stated she would call Dr. OVirgina Evenerto come assess pt at bedside.     072 Dr. OVirgina Evenerupdated about pt status. New order given for '800mg'$  PO motrin to help lower temperature. No other orders.     0558: ICU rover @ bedside to assess pt. Pulled covers back to help decrease temp. Pt complains of shivering but needs body temp to lower. Rover stated she would pass on to day shift rover to come by and assess pt later today & see if status has improved.

## 2021-01-27 NOTE — Progress Notes (Signed)
VANCO DAILY FOLLOW UP NOTE  [facility] Pharmacokinetic Monitoring Service - Vancomycin    Consulting Provider: Dr. Laureen Ochs   Indication: Sepsis  Target Concentration: Goal AUC/MIC 400-600 mg*hr/L  Day of Therapy: 2  Additional Antimicrobials: Pip-Tazo    Pertinent Laboratory Values:   Wt Readings from Last 1 Encounters:   01/26/21 114 lb (51.7 kg)     Temp Readings from Last 1 Encounters:   01/27/21 98.9 ??F (37.2 ??C) (Oral)     Recent Labs     01/26/21  0502 01/26/21  0615 01/26/21  1634 01/26/21  2113 01/27/21  0025 01/27/21  0731   BUN 10  --  12  --   --  9   CREATININE 0.90  --  0.70  --   --  0.70   WBC 11.8*  --   --   --   --  4.2*   PROCAL 1.10*  --   --   --   --  0.68*   LACACIDPL 4.5*   < > 2.9* 4.1* 2.9* 1.9    < > = values in this interval not displayed.     Estimated Creatinine Clearance: 103 mL/min (based on SCr of 0.7 mg/dL).    No results found for: Pandora Leiter    MRSA Nasal Swab: N/A. Non-respiratory infection.      Assessment:  Date/Time Dose Concentration AUC         Note: Serum concentrations collected for AUC dosing may appear elevated if collected in close proximity to the dose administered, this is not necessarily an indication of toxicity    Plan:  Dosing recommendations based on Bayesian software  Started vancomycin 1000 mg X 1, then 750 mg Q12H  Anticipated AUC of 454 and trough concentration of 13.7 at steady state  Renal labs as indicated   Vancomycin concentrations will be ordered as clinically appropriate   Pharmacy will continue to monitor patient and adjust therapy as indicated    Thank you for the consult,  Rodman Key L. [place], [facility]

## 2021-01-27 NOTE — Progress Notes (Signed)
TRANSFER - OUT REPORT:    Verbal report given to Corene Cornea, RN on Robin Mendoza  being transferred to ICU ([id number] [phone]) for change in patient condition (Hypotension & in need of Levo gtt)       Report consisted of patient's Situation, Background, Assessment and   Recommendations(SBAR).     Information from the following report(s) Nurse Handoff Report, Index, Adult Overview, Intake/Output, MAR, Recent Results, and Cardiac Rhythm Sinus Tachy  was reviewed with the receiving nurse.    Lines:   Peripheral IV 01/26/21 Right;Dorsal Forearm (Active)   Site Assessment Clean, dry & intact 01/27/21 0719   Line Status Infusing 01/27/21 0719   Line Care Connections checked and tightened 01/27/21 0719   Phlebitis Assessment No symptoms 01/27/21 0719   Infiltration Assessment 0 01/27/21 0719   Alcohol Cap Used No 01/27/21 0048   Dressing Status Clean, dry & intact 01/27/21 0719   Dressing Type Transparent 01/27/21 0719       Peripheral IV 01/06/21 (Active)   Site Assessment Clean, dry & intact 01/22/21 1108   Phlebitis Assessment No symptoms 01/11/21 1430   Infiltration Assessment 0 01/11/21 1430        Opportunity for questions and clarification was provided.      Patient transported with:  Monitor, Registered Nurse, and Tech      1218: Heart monitor returned to monitor room.

## 2021-01-27 NOTE — Progress Notes (Signed)
2219: Notified MD of critical lactic 4.1. Also notified about increasing HR 130s-150s. New orders acknowledged of LR bolus, Rectal temp to be taken, & recheck lactic after bolus.    Fever of 102.7 rectally. MD notified. Tylenol given & no new orders placed.     0125: Rectal temp of 101. Recent lactic is 2.9. MD notified. New orders given to repeat lactic in 6 hours & give another liter bolus.

## 2021-01-27 NOTE — Progress Notes (Signed)
Noted the patient has been moved to the ICU

## 2021-01-27 NOTE — Progress Notes (Signed)
Informed Dr. Simeon Craft of pt's BP being 84/45 and HR 109. Transfer orders placed by Dr. Simeon Craft. House supervisor called and informed. Along with ICU rover Josh, RN called and informed.       1143: RN at bedside. Pt is talking to RN and requesting ativan. RN explained that we can not give Ativan for a BP that low and she will have to wait to be transferred to ICU when they start her on Levo gtt. Pt verbalized understanding along with parents at the bedside.

## 2021-01-27 NOTE — Progress Notes (Signed)
TRANSFER - IN REPORT:    Verbal report received from   [facility] on Alden Hipp  being received from 2nd floor for urgent transfer      Report consisted of patient's Situation, Background, Assessment and   Recommendations(SBAR).     Information from the following report(s) Nurse Handoff Report, Index, ED SBAR, Intake/Output, MAR, Recent Results, and Cardiac Rhythm SR/ST  was reviewed with the receiving nurse.    Opportunity for questions and clarification was provided.      Assessment completed upon patient's arrival to unit and care assumed.

## 2021-01-27 NOTE — Progress Notes (Signed)
Informed Dr. Simeon Craft that pt's BP is 91/52 and HR is 136 this AM.     0744: Dr. Simeon Craft stated to give pt 557m bolus of NS. Informed Dr. SSimeon Craftthat pt has had around 4,004mof boluses since yesterday. Dr. SiSimeon Crafttated to give this bolus and if pt does not respond to bolus then pt will be transferred to ICU.     0754: NS bolus of 50030mtarted with brother at bedside and informed of the plan of care at this time. Pt is resting quietly with eyes closed.      [id number] rover contacted d/t pt's condition. RN requested that the pt be assessed by ICU rover. ICU Rover informed of VS and of pt receiving around 4,000m8m boluses since yesterday AM. ICU rover stated he needed to go to CT with pt first then will come evaluate pt.    0801: Dr. SidhSimeon Craftbedside to evaluate pt.      0914: Informed Dr. SidhSimeon Craftt pt's BP is 94/47 and HR is 119. No new orders at this time. Stated that if systolic gets below 90 then pt will be transferred to ICU for Levo gtt.

## 2021-01-27 NOTE — Progress Notes (Signed)
END OF SHIFT NOTE:    INTAKE/OUTPUT  09/17 0701 - 09/18 0700  In: 4553.6 [P.O.:120; I.V.:4433.6]  Out: 1100 [Urine:1100]  Voiding: Yes  Catheter: No  Drain:              Flatus: Patient does have flatus present.    Stool: 0 occurrences.    Characteristics:                Emesis: 0 occurrences.    Characteristics:        VITAL SIGNS  Patient Vitals for the past 12 hrs:   Temp Pulse Resp BP SpO2   01/27/21 0706 98.9 ??F (37.2 ??C) (!) 136 18 (!) 91/52 94 %   01/27/21 0400 (!) 101.7 ??F (38.7 ??C) (!) 132 18 (!) 108/58 --   01/27/21 0115 (!) 101 ??F (38.3 ??C) (!) 128 18 -- --   01/27/21 0048 -- (!) 126 -- -- --   01/26/21 2322 (!) 102.7 ??F (39.3 ??C) (!) 145 16 (!) 102/46 --   01/26/21 2210 -- -- 18 -- --   01/26/21 2140 -- -- 18 -- --       Pain Assessment  '@BSHSIFLOW'$ (13)@  Pain Location: Abdomen  Patient's Stated Pain Goal: 0 - No pain    Ambulating  Yes    Shift report given to oncoming nurse at the bedside.    Marlana Latus, RN

## 2021-01-27 NOTE — Progress Notes (Signed)
[facility] CRITICAL CARE OUTREACH NURSE PROGRESS REPORT      SUBJECTIVE: Called to assess patient secondary to nurse concern.      Vitals:    01/27/21 0115 01/27/21 0400 01/27/21 0706 01/27/21 0900   BP:  (!) 108/58 (!) 91/52 (!) 94/47   Pulse: (!) 128 (!) 132 (!) 136 (!) 119   Resp: '18 18 18 15   '$ Temp: (!) 101 ??F (38.3 ??C) (!) 101.7 ??F (38.7 ??C) 98.9 ??F (37.2 ??C)    TempSrc: Rectal Oral Oral    SpO2:   94%    Weight:       Height:            LAB DATA:    Recent Labs     01/26/21  0502 01/26/21  1634 01/27/21  0731   NA 137 140 141   K 3.0* 3.5 3.7   CL 109 109 113*   CO2 '23 24 22   '$ BUN '10 12 9   '$ GFRAA >60 >60 >60   MG 1.5* 2.0 1.8   PHOS 1.4* 2.0* 2.0*   GLOB 3.5  --  2.2*   ALT 56  --  125*        Recent Labs     01/26/21  0502 01/27/21  0731   WBC 11.8* 4.2*   HGB 12.7 10.8*   HCT 38.6 32.6*   PLT 230 148*          OBJECTIVE: On arrival to room, I found patient to be resting in bed with brother at bedside. Dr. Simeon Craft and Dr. Elesa Massed at bedside to assess.    ASSESSMENT:  Patient is very lethargic but responds to stimulation. When awake PT is A/O. PT is febrile, tachycardic and hypotensive with LA of 2.9. Recheck CMP and Lactic pending. Respirations even and unlabored with bilateral lung sounds clear to auscultation. HR 130s per monitor room, BP marginal at 91/52 and is currently receiving 500cc NS bolus.     PLAN:  VS, labs, and progress notes reviewed. Will continue to follow per outreach protocol.  Plan to recheck BP following bolus, transfer to ICU pending on patient's response to fluid per Dr. Simeon Craft.

## 2021-01-28 LAB — COMPREHENSIVE METABOLIC PANEL
ALT: 107 U/L — ABNORMAL HIGH (ref 12–65)
AST: 125 U/L — ABNORMAL HIGH (ref 15–37)
Albumin/Globulin Ratio: 0.9 — ABNORMAL LOW (ref 1.2–3.5)
Albumin: 2.2 g/dL — ABNORMAL LOW (ref 3.5–5.0)
Alk Phosphatase: 168 U/L — ABNORMAL HIGH (ref 50–136)
Anion Gap: 6 mmol/L (ref 4–13)
BUN: 9 MG/DL (ref 6–23)
CO2: 20 mmol/L — ABNORMAL LOW (ref 21–32)
Calcium: 8.1 MG/DL — ABNORMAL LOW (ref 8.3–10.4)
Chloride: 115 mmol/L — ABNORMAL HIGH (ref 101–110)
Creatinine: 0.5 MG/DL — ABNORMAL LOW (ref 0.6–1.0)
GFR African American: >60 mL/min/{1.73_m2} (ref >60)
GFR Non-African American: >60 mL/min/{1.73_m2} (ref >60)
Globulin: 2.4 g/dL (ref 2.3–3.5)
Glucose: 112 mg/dL — ABNORMAL HIGH (ref 65–100)
Potassium: 3.8 mmol/L (ref 3.5–5.1)
Sodium: 141 mmol/L (ref 136–145)
Total Bilirubin: 2.5 MG/DL — ABNORMAL HIGH (ref 0.2–1.1)
Total Protein: 4.6 g/dL — ABNORMAL LOW (ref 6.3–8.2)

## 2021-01-28 LAB — MAGNESIUM: Magnesium: 2.1 mg/dL (ref 1.8–2.4)

## 2021-01-28 LAB — CBC WITH AUTO DIFFERENTIAL
Absolute Eos #: 0 10*3/uL (ref 0.0–0.8)
Absolute Immature Granulocyte: 0 10*3/uL (ref 0.0–0.5)
Absolute Lymph #: 0.9 10*3/uL (ref 0.5–4.6)
Absolute Mono #: 0.2 10*3/uL (ref 0.1–1.3)
Basophils Absolute: 0 10*3/uL (ref 0.0–0.2)
Basophils: 0 % (ref 0.0–2.0)
Eosinophils %: 0 % — ABNORMAL LOW (ref 0.5–7.8)
Hematocrit: 31.4 % — ABNORMAL LOW (ref 35.8–46.3)
Hemoglobin: 10.3 g/dL — ABNORMAL LOW (ref 11.7–15.4)
Immature Granulocytes: 0 % (ref 0.0–5.0)
Lymphocytes: 23 % (ref 13–44)
MCH: 29.5 PG (ref 26.1–32.9)
MCHC: 32.8 g/dL (ref 31.4–35.0)
MCV: 90 FL (ref 79.6–97.8)
MPV: 10.6 FL (ref 9.4–12.3)
Monocytes: 6 % (ref 4.0–12.0)
Platelet Comment: DECREASED
Platelets: 115 10*3/uL — ABNORMAL LOW (ref 150–450)
RBC: 3.49 M/uL — ABNORMAL LOW (ref 4.05–5.2)
RDW: 13 % (ref 11.9–14.6)
Seg Neutrophils: 71 % (ref 43–78)
Segs Absolute: 2.9 10*3/uL (ref 1.7–8.2)
WBC: 4 10*3/uL — ABNORMAL LOW (ref 4.3–11.1)
nRBC: 0 10*3/uL (ref 0.0–0.2)

## 2021-01-28 LAB — HEPATITIS PANEL, ACUTE
Hep A IgM: NONREACTIVE
Hep B Core Ab, IgM: NONREACTIVE
Hepatitis B Surface Ag: NONREACTIVE
Hepatitis C Ab: NONREACTIVE

## 2021-01-28 LAB — PHOSPHORUS: Phosphorus: 2.8 MG/DL (ref 2.5–4.5)

## 2021-01-28 LAB — VANCOMYCIN LEVEL, RANDOM: Vancomycin Rm: 17.1 UG/ML

## 2021-01-28 MED ORDER — VANCOMYCIN HCL 750 MG IV SOLR
750 MG | Freq: Three times a day (TID) | INTRAVENOUS | Status: DC
Start: 2021-01-28 — End: 2021-01-29
  Administered 2021-01-28 – 2021-01-29 (×4): 750 mg via INTRAVENOUS

## 2021-01-28 MED ORDER — MORPHINE SULFATE 4 MG/ML IJ SOLN
4 MG/ML | INTRAMUSCULAR | Status: DC | PRN
Start: 2021-01-28 — End: 2021-01-28
  Administered 2021-01-28 (×2): 4 mg via INTRAVENOUS

## 2021-01-28 MED ORDER — LORAZEPAM 2 MG/ML IJ SOLN
2 MG/ML | Freq: Four times a day (QID) | INTRAMUSCULAR | Status: DC | PRN
Start: 2021-01-28 — End: 2021-01-28
  Administered 2021-01-28: 03:00:00 0.5 mg via INTRAVENOUS

## 2021-01-28 MED ORDER — DIPHENHYDRAMINE HCL 50 MG/ML IJ SOLN
50 MG/ML | Freq: Four times a day (QID) | INTRAMUSCULAR | Status: DC | PRN
Start: 2021-01-28 — End: 2021-01-31
  Administered 2021-01-28: 14:00:00 25 mg via INTRAVENOUS

## 2021-01-28 MED ORDER — LORAZEPAM 2 MG/ML IJ SOLN
2 MG/ML | Freq: Four times a day (QID) | INTRAMUSCULAR | Status: DC | PRN
Start: 2021-01-28 — End: 2021-01-31
  Administered 2021-01-28: 19:00:00 0.25 mg via INTRAVENOUS

## 2021-01-28 MED ORDER — MORPHINE SULFATE 4 MG/ML IV SOLN
4 MG/ML | Freq: Once | INTRAVENOUS | Status: AC
Start: 2021-01-28 — End: 2021-01-28
  Administered 2021-01-28: 05:00:00 3 mg via INTRAVENOUS

## 2021-01-28 MED ORDER — SERTRALINE HCL 50 MG PO TABS
50 MG | Freq: Every day | ORAL | Status: DC
Start: 2021-01-28 — End: 2021-01-31
  Administered 2021-01-28 – 2021-01-31 (×4): 50 mg via ORAL

## 2021-01-28 MED ORDER — NORMAL SALINE FLUSH 0.9 % IV SOLN
0.9 % | Freq: Two times a day (BID) | INTRAVENOUS | Status: DC
Start: 2021-01-28 — End: 2021-01-31
  Administered 2021-01-28 – 2021-01-31 (×7): 10 mL via INTRAVENOUS

## 2021-01-28 MED ORDER — LIDOCAINE HCL 1 % IJ SOLN
1 % | Freq: Once | INTRAMUSCULAR | Status: DC
Start: 2021-01-28 — End: 2021-01-31

## 2021-01-28 MED ORDER — SODIUM CHLORIDE 0.9 % IV SOLN
0.9 % | INTRAVENOUS | Status: DC | PRN
Start: 2021-01-28 — End: 2021-01-31

## 2021-01-28 MED ORDER — OLANZAPINE 5 MG PO TBDP
5 MG | Freq: Every evening | ORAL | Status: DC
Start: 2021-01-28 — End: 2021-01-31
  Administered 2021-01-28 – 2021-01-31 (×4): 5 mg via ORAL

## 2021-01-28 MED ORDER — PANTOPRAZOLE SODIUM 40 MG PO TBEC
40 MG | Freq: Every day | ORAL | Status: DC
Start: 2021-01-28 — End: 2021-01-29
  Administered 2021-01-29: 13:00:00 40 mg via ORAL

## 2021-01-28 MED ORDER — KETOROLAC TROMETHAMINE 15 MG/ML IJ SOLN
15 MG/ML | Freq: Four times a day (QID) | INTRAMUSCULAR | Status: AC | PRN
Start: 2021-01-28 — End: 2021-01-30
  Administered 2021-01-28 – 2021-01-30 (×7): 4.95 mg via INTRAVENOUS

## 2021-01-28 MED ORDER — METHYLPREDNISOLONE SODIUM SUCC 125 MG IJ SOLR
125 MG | Freq: Once | INTRAMUSCULAR | Status: AC
Start: 2021-01-28 — End: 2021-01-28
  Administered 2021-01-28: 14:00:00 125 mg via INTRAVENOUS

## 2021-01-28 MED ORDER — NORMAL SALINE FLUSH 0.9 % IV SOLN
0.9 % | INTRAVENOUS | Status: DC | PRN
Start: 2021-01-28 — End: 2021-01-31
  Administered 2021-01-28: 12:00:00 10 mL via INTRAVENOUS

## 2021-01-28 MED ORDER — POLYETHYLENE GLYCOL 3350 17 G PO PACK
17 g | Freq: Every day | ORAL | Status: DC | PRN
Start: 2021-01-28 — End: 2021-01-31
  Administered 2021-01-28: 15:00:00 17 g via ORAL

## 2021-01-28 MED ORDER — DIPHENHYDRAMINE HCL 50 MG/ML IJ SOLN
50 MG/ML | Freq: Four times a day (QID) | INTRAMUSCULAR | Status: DC | PRN
Start: 2021-01-28 — End: 2021-01-28
  Administered 2021-01-28: 12:00:00 12.5 mg via INTRAVENOUS

## 2021-01-28 MED ORDER — SODIUM CHLORIDE 0.9 % IV SOLN
0.9 % | INTRAVENOUS | Status: DC
Start: 2021-01-28 — End: 2021-01-31
  Administered 2021-01-28 (×2): via INTRAVENOUS

## 2021-01-28 MED FILL — MORPHINE SULFATE 4 MG/ML IJ SOLN: 4 mg/mL | INTRAMUSCULAR | Qty: 1

## 2021-01-28 MED FILL — SOLU-CORTEF 100 MG IJ SOLR: 100 MG | INTRAMUSCULAR | Qty: 2

## 2021-01-28 MED FILL — OLANZAPINE 5 MG PO TBDP: 5 MG | ORAL | Qty: 1

## 2021-01-28 MED FILL — ONDANSETRON HCL 4 MG/2ML IJ SOLN: 4 MG/2ML | INTRAMUSCULAR | Qty: 2

## 2021-01-28 MED FILL — DOXY 100 100 MG IV SOLR: 100 MG | INTRAVENOUS | Qty: 100

## 2021-01-28 MED FILL — PIPERACILLIN SOD-TAZOBACTAM SO 3.375 (3-0.375) G IV SOLR: 3.375 (3-0.375) g | INTRAVENOUS | Qty: 3375

## 2021-01-28 MED FILL — KETOROLAC TROMETHAMINE 15 MG/ML IJ SOLN: 15 MG/ML | INTRAMUSCULAR | Qty: 1

## 2021-01-28 MED FILL — VANCOMYCIN HCL 750 MG IV SOLR: 750 MG | INTRAVENOUS | Qty: 750

## 2021-01-28 MED FILL — SERTRALINE HCL 50 MG PO TABS: 50 MG | ORAL | Qty: 1

## 2021-01-28 MED FILL — DIPHENHYDRAMINE HCL 50 MG/ML IJ SOLN: 50 MG/ML | INTRAMUSCULAR | Qty: 1

## 2021-01-28 MED FILL — PEG 3350 17 G PO PACK: 17 g | ORAL | Qty: 1

## 2021-01-28 MED FILL — LORAZEPAM 2 MG/ML IJ SOLN: 2 MG/ML | INTRAMUSCULAR | Qty: 1

## 2021-01-28 MED FILL — METOCLOPRAMIDE HCL 5 MG/ML IJ SOLN: 5 MG/ML | INTRAMUSCULAR | Qty: 2

## 2021-01-28 MED FILL — SOLU-MEDROL 125 MG IJ SOLR: 125 MG | INTRAMUSCULAR | Qty: 125

## 2021-01-28 MED FILL — MORPHINE SULFATE 2 MG/ML IJ SOLN: 2 mg/mL | INTRAMUSCULAR | Qty: 1

## 2021-01-28 MED FILL — XYLOCAINE 1 % IJ SOLN: 1 % | INTRAMUSCULAR | Qty: 20

## 2021-01-28 MED FILL — ACETAMINOPHEN 325 MG PO TABS: 325 MG | ORAL | Qty: 2

## 2021-01-28 MED FILL — PANTOPRAZOLE SODIUM 40 MG IV SOLR: 40 MG | INTRAVENOUS | Qty: 40

## 2021-01-28 NOTE — Progress Notes (Signed)
VANCO DAILY FOLLOW UP NOTE  [facility] Pharmacokinetic Monitoring Service - Vancomycin    Consulting Provider: Dr. Laureen Ochs   Indication: Sepsis  Target Concentration: Goal AUC/MIC 400-600 mg*hr/L  Day of Therapy: 3  Additional Antimicrobials: Pip-Tazo    Pertinent Laboratory Values:   Wt Readings from Last 1 Encounters:   01/26/21 114 lb (51.7 kg)     Temp Readings from Last 1 Encounters:   01/28/21 99 ??F (37.2 ??C) (Oral)     Recent Labs     01/26/21  0502 01/26/21  0615 01/26/21  1634 01/26/21  2113 01/27/21  0025 01/27/21  0731 01/28/21  0337   BUN 10  --  12  --   --  9 9   CREATININE 0.90  --  0.70  --   --  0.70 0.50*   WBC 11.8*  --   --   --   --  4.2* 4.0*   PROCAL 1.10*  --   --   --   --  0.68*  --    LACACIDPL 4.5*   < > 2.9* 4.1* 2.9* 1.9  --     < > = values in this interval not displayed.     Estimated Creatinine Clearance: 144 mL/min (A) (based on SCr of 0.5 mg/dL (L)).    Lab Results   Component Value Date/Time    VANCORANDOM 17.1 01/28/2021 03:37 AM       MRSA Nasal Swab: N/A. Non-respiratory infection.      Assessment:  Date/Time Dose Concentration AUC   9/19 0337 750 mg q 12 hours 17.1 305   Note: Serum concentrations collected for AUC dosing may appear elevated if collected in close proximity to the dose administered, this is not necessarily an indication of toxicity    Plan:  Current dosing regimen is sub-therapeutic  Increase dose to 750 mg q 8 hours for predicted AUC/Tr of 455/14.3  Repeat vancomycin concentrations will be ordered as clinically appropriate   Pharmacy will continue to monitor patient and adjust therapy as indicated    Thank you for the consult,  Zelphia Cairo Destanie Tibbetts, [place]

## 2021-01-28 NOTE — Care Coordination-Inpatient (Signed)
Chart reviewed s/p tx to ICU for hypotenison and levo gtt. Gtt off at present. CM following for d/c needs/POC. Screen completed prior to tx. LOS 2 days.

## 2021-01-28 NOTE — Progress Notes (Signed)
Bedside and verbal shift change report received from  Manufacturing systems engineer (offgoing nurse). Report included the following information SBAR, Kardex, Intake/Output, MAR, Recent Results, Med Rec Status, Cardiac Rhythm SR/SB, Alarm Parameters , and Quality Measures.     Dual skin assessment completed at bedside: skin intact (list pertinent skin assessment findings)    Dual verification of gtts completed (name of gtts verified): n/a

## 2021-01-28 NOTE — Progress Notes (Signed)
[facility] Progress Note         Admit Date:  01/26/2021    Today's Date:  01/28/2021    CC: Abdominal pain, nausea with vomiting    Subjective:     Patient to be seen.     Medications:   Current Facility-Administered Medications   Medication Dose Route Frequency    lidocaine 1 % injection 5 mL  5 mL IntraDERmal Once    sodium chloride flush 0.9 % injection 5-40 mL  5-40 mL IntraVENous 2 times per day    sodium chloride flush 0.9 % injection 5-40 mL  5-40 mL IntraVENous PRN    0.9 % sodium chloride infusion  25 mL IntraVENous PRN    sertraline (ZOLOFT) tablet 50 mg  50 mg Oral Daily    [START ON 01/29/2021] pantoprazole (PROTONIX) tablet 40 mg  40 mg Oral QAM AC    polyethylene glycol (GLYCOLAX) packet 17 g  17 g Oral Daily PRN    diphenhydrAMINE (BENADRYL) injection 25 mg  25 mg IntraVENous Q6H PRN    vancomycin (VANCOCIN) 750 mg in sodium chloride 0.9 % 250 mL IVPB (Vial2Bag)  750 mg IntraVENous Q8H    LORazepam (ATIVAN) injection 0.25 mg  0.25 mg IntraVENous Q6H PRN    ketorolac (TORADOL) injection 4.95 mg  4.95 mg IntraVENous Q6H PRN    doxycycline (VIBRAMYCIN) 100 mg in sodium chloride 0.9 % 100 mL IVPB  100 mg IntraVENous Q12H    medicated lip ointment (BLISTEX)   Topical PRN    potassium chloride (KLOR-CON M) extended release tablet 40 mEq  40 mEq Oral PRN    Or    potassium bicarb-citric acid (EFFER-K) effervescent tablet 40 mEq  40 mEq Oral PRN    Or    potassium chloride 10 mEq/100 mL IVPB (Peripheral Line)  10 mEq IntraVENous PRN    magnesium sulfate 2000 mg in 50 mL IVPB premix  2,000 mg IntraVENous PRN    sodium phosphate 10 mmol in sodium chloride 0.9 % 250 mL IVPB  10 mmol IntraVENous PRN    Or    sodium phosphate 15 mmol in sodium chloride 0.9 % 250 mL IVPB  15 mmol IntraVENous PRN    Or    sodium phosphate 20 mmol in sodium chloride 0.9 % 500 mL IVPB  20 mmol IntraVENous PRN    OLANZapine zydis (ZYPREXA) disintegrating tablet 5 mg  5 mg Oral Nightly    0.9 % sodium chloride infusion    IntraVENous Continuous    0.9 % sodium chloride bolus  100 mL IntraVENous ONCE PRN    sodium chloride flush 0.9 % injection 10 mL  10 mL IntraVENous ONCE PRN    sodium chloride flush 0.9 % injection 5-40 mL  5-40 mL IntraVENous 2 times per day    sodium chloride flush 0.9 % injection 5-40 mL  5-40 mL IntraVENous PRN    0.9 % sodium chloride infusion   IntraVENous PRN    ondansetron (ZOFRAN-ODT) disintegrating tablet 4 mg  4 mg Oral Q8H PRN    Or    ondansetron (ZOFRAN) injection 4 mg  4 mg IntraVENous Q6H PRN    acetaminophen (TYLENOL) tablet 650 mg  650 mg Oral Q6H PRN    Or    acetaminophen (TYLENOL) suppository 650 mg  650 mg Rectal Q6H PRN    piperacillin-tazobactam (ZOSYN) 3,375 mg in sodium chloride 0.9 % 50 mL IVPB (mini-bag)  3,375 mg IntraVENous q8h  Review of Systems:  ROS was obtained, with pertinent positives as listed above.  No chest pain or SOB.    Diet: Clear liquid      Objective:   Vitals:  BP (!) 137/92    Pulse 57    Temp 99.1 ??F (37.3 ??C) (Oral)    Resp 14    Ht 5\' 5"  (1.651 m)    Wt 114 lb (51.7 kg)    SpO2 94%    BMI 18.97 kg/m??   Intake/Output:  No intake/output data recorded.  09/17 1901 - 09/19 0700  In: 6123.6 [P.O.:200; I.V.:4983.6]  Out: 1400 [Urine:1400]  Exam:  General appearance: alert, cooperative, no distress  Lungs: clear to auscultation bilaterally anteriorly  Heart: regular rate and rhythm  Abdomen: soft, non-tender. Bowel sounds normal. No masses, no organomegaly  Extremities: extremities normal, atraumatic, no cyanosis or edema  Neuro:  alert and oriented    Data Review (Labs):    Recent Labs     01/26/21  0502 01/26/21  1634 01/27/21  0731 01/28/21  0337   WBC 11.8*  --  4.2* 4.0*   HGB 12.7  --  10.8* 10.3*   HCT 38.6  --  32.6* 31.4*   PLT 230  --  148* 115*   MCV 91.9  --  90.8 90.0   NA 137 140 141 141   K 3.0* 3.5 3.7 3.8   CL 109 109 113* 115*   CO2 23 24 22  20*   BUN 10 12 9 9    MG 1.5* 2.0 1.8 2.1   AST 110*  --  163* 125*   ALT 56  --  125* 107*     RIGHT UPPER  QUADRANT  ULTRASOUND 01/27/21       INDICATION: Abnormal liver function       COMPARISON: CT dated 01/26/2021       Transabdominal imaging of the upper abdomen was performed.       FINDINGS:    -LIVER: 15 cm.  Normal echogenicity.  No masses.  Normal flow in the portal   vein.   -BILE DUCTS: No intrahepatic bile duct dilatation.  CBD diameter = 4 mm.   -GALLBLADDER: Gallbladder is absent.   -PANCREAS: Normal.       -RIGHT KIDNEY: 11.6 cm.  No mass or hydronephrosis.   -ABDOMINAL AORTA AND IVC: Normal in size.   -ASCITES: No free fluid.           Impression   Unremarkable right upper quadrant ultrasound     CT of the Abdomen and Pelvis with contrast 01/26/21       CLINICAL INDICATION:  Acute on chronic moderate vomiting and generalized   abdominal pain, gastroparesis, intermittent fevers since yesterday, subacute   diarrhea.  History of cholecystectomy, appendectomy, autism, depression, GERD,   weight loss.       COMPARISON: 11/08/2020, 10/22/2020, 09/07/2020.       TECHNIQUE: Dose reduction technique used: Automated exposure Control and/or   adjustment of mA and kV according to patient size. Multiple axial images were   obtained through the abdomen and pelvis after intravenous injection of 125cc of   isovue 370 IV contrast to further evaluate vessels and organs. Coronal   reformatted images were done for further evaluation of bones and organs. Oral   contrast was given for further evaluation of GI tract and adjacent organs.       FINDINGS: Partially included lung bases demonstrate trace to small posterior  layering pleural effusions bilaterally.       Abdomen:  There are no suspicious lesions in the liver or spleen.    Cholecystectomy noted without obvious complication..  The adrenal glands and   pancreas appear unremarkable.  There is normal enhancement of the kidneys, no   hydronephrosis.         No lymphadenopathy. No free air, focal inflammatory changes or fluid collections   in the abdomen. Aorta normal caliber.        There is no evidence of bowel obstruction. Short segment jejunal intussusception   noted in left upper abdomen (axial 32, coronal 50).  Multiple nonspecific   borderline prominent small bowel loops elsewhere, possibly enteritis.  Oral   contrast has only reached the level of the distal stomach, limiting GI tract   evaluation throughout.  Consider follow-up GI evaluation and/or direct   visualization if persisting clinical concern and indication.  Mildly-moderately   prominent stool volume in large bowel.       Pelvis:  No acute inflammatory changes or fluid collections in the pelvis.  No   lymphadenopathy or mass.  Intrauterine device in place.  Small volume   nonspecific pelvic free fluid which could be physiologic given patient age.       Bones: No acute osseous lesions.               Impression   1.  Trace to small pleural effusions in both lung bases.   2.  Left upper abdominal small bowel intussusception, most often a transient and   self-limited process.   3.  Cholecystectomy.     EGD on 08/29/19 for EoE, nausea, vomiting, atypical chest pain, revealed eosinophilic esophagitis. Esophageal bxs consistent with EoE.     EGD on 07/24/20 revealed EoE. Esopahgeal bxs with patchy chronic inflammation, negative for features of EoE.     Colonoscopy on 09/17/20 for right lower quadrant pain, altered bowel habits, abnormal CT, weight loss, rectal bleeding, was normal. Terminal ileum and random colon biopsies were benign. Repeat colonoscopy recommended at age 38.     Colonoscopy with [facility] on 11/20/20 with biopsy of distal ileum ~3 cm proximal to ICV if possible - area of inflammation on CTe) and colon biopsies.     Assessment:     Principal Problem:    Sepsis ([place])  Active Problems:    Hypokalemia    Hypophosphatemia    Hypomagnesemia    Gastroparesis    Elevated liver enzymes    Autism    Depression    Weight loss  Resolved Problems:    * No resolved hospital problems. *    20 y.o. female with PMH listed above, who was  seen in consultation on 01/26/21 at the request of Dr. Rosana Hoes for abdominal pain with N/V.  She has a history of months of RUQ pain that has defied evaluation.  There has been associated N/V with decrease eating, leading to weight loss with electrolyte dyscrasias. She has been evaluated by [facility]. Kend[facility], Dr. Dot Been with [facility], and Dr. Terrace Arabia with [facility].  The most recent evaluation was at [facility], where there is a concern over gastroparesis and visceral hypersensitivity. Patient is being evaluated for sepsis, etiology is unknown at this time.     01/27/21 RUQ US unremarkable.   01/28/21 Albumin 2.2, ALP 168, ALT 107, AST 125, Bilirubin 2.5.     Plan:     - Supportive care, maintain electrolytes, antiemetics  Harrel Lemon, NP  [facility]      Patient seen and examined, plan reviewed. Agree with the exception of any noted changes/additions.    I spoke with the patient, her brother at bedside and her mother over the phone.      I went over the down trending LFTs.  I suspect these were elevated due to sepsis and they are now improving which is good news.  I also went over the RUQ [place] from 9/18:  Unremarkable right upper quadrant ultrasound.    Patient reports to be passing gas, especially with ambulation but brother says that she has not had a BM for last 4 days.      Mother said that [facility] asked to start Zemple but with insurance issues, they ended up just getting it recently so patient has only one dose of Motegrity so far.  We agreed that it is worthwhile to try it to see if it helps with her symptoms.  The dosing for Motegrity is 2 mg PO daily.    Patient requested advancing her diet.  I am fine with that as she tolerates.      Appreciate ID recommendations.    Joya San, MD  [facility]

## 2021-01-28 NOTE — Consults (Signed)
Infectious Disease Consult    Today's Date: 01/28/2021   Admit Date: 01/26/2021    Impression:   SIRS / Sepsis - unclear etiology  Small bowel intussusception   Abnormal LFTs  Abdominal pain  Chronic gastroparesis  Severe protein calorie malnutrition  Autism / ADHD / Depression  H/o cholecystectomy, appendectomy, 09/2020    Plan:   Unclear etiology of the current cause of sepsis.  Possible causes include bacterial translocation related to the use of a peripheral IV at home or possibly due to the mild intussusception seen on CT scan.  Given that she has thrombocytopenia and elevated liver function tests today, we will empirically treat for tickborne disease with doxycycline given her ongoing animal exposures.  Continue empiric antibiotics for another 24 hours until cultures result  If she develops diarrhea, then we would recommend repeating the stool testing from earlier this summer in some form. This is not an issue now.  It is very difficult to link the current episode to her clinical decline since 2022-07-11.  One possible consideration could be Whipple's disease.  We could consider EGD with duodenal biopsy with PAS staining if we continue to have ongoing difficulties with diagnosis.  Her primary exposures are extensive travel -- most recently [place] and [place]; and rabbit exposure. The rabbit looked like a wild rabbit and was known to bite but did not bite her, and then died suddenly in 07-11-2020.  Test for  Leptospirosis  Fasciola hepatica  Strongyloidiosis  Yellow fever  Brucella ab  Blood smear looking for bartonella bacilliformis  Hepatitis A  Tularemia  Send karius next generation pcr    Anti-infectives:   IV vanc  IV zosyn    Subjective:   Afebrile.  + myalgias in legs.  RUQ pain is still her primary symptom.  Tmax was 102.7 but afebrile over the past 24 hours.        Allergies   Allergen Reactions    Cow's Milk Anaphylaxis and Hives    Food Anaphylaxis    Lac Bovis Anaphylaxis    Albumen, Egg Other (See Comments)      No longer allergic      Dog Epithelium Allergy Skin Test Itching    Other Itching        Review of Systems:  A comprehensive review of systems is negative except as stated in the HPI.      Objective:     Visit Vitals  BP (!) 149/76   Pulse 62   Temp 99 ??F (37.2 ??C) (Oral)   Resp 22   Ht 5\' 5"  (1.651 m)   Wt 114 lb (51.7 kg)   SpO2 95%   BMI 18.97 kg/m??     Temp (24hrs), Avg:98.7 ??F (37.1 ??C), Min:98.2 ??F (36.8 ??C), Max:99 ??F (37.2 ??C)       Lines:  Peripheral IV:       Physical Exam:    General:  Alert, cooperative but sleepy   Eyes:  Sclera anicteric. Peri-orbital edema   Mouth/Throat: Mucous membranes normal   Neck: Supple   Lungs:   Clear to auscultation bilaterally, good effort   CV:  Regular rate and rhythm,no murmur, click, rub or gallop   Abdomen:   Soft, tender in the right upper quadrant   Extremities: No cyanosis; moderate extremity edema   Skin: Skin color, texture, turgor normal. no acute rash or lesions; no phlebitis   Neuro: Responsive, moves all extremities   Musculoskeletal: No swelling or deformity   Lines/Devices:  Intact, no erythema, drainage or tenderness   Psych: Alert and oriented, normal mood affect given the setting       Data Review:     CBC:  Recent Labs     01/26/21  0502 01/27/21  0731 01/28/21  0337   WBC 11.8* 4.2* 4.0*   HGB 12.7 10.8* 10.3*   HCT 38.6 32.6* 31.4*   PLT 230 148* 115*         BMP:  Recent Labs     01/26/21  1634 01/27/21  0731 01/28/21  0337   BUN 12 9 9    NA 140 141 141   K 3.5 3.7 3.8   CL 109 113* 115*   CO2 24 22 20*         LFTS:  Recent Labs     01/26/21  0502 01/27/21  0731 01/28/21  0337   ALT 56 125* 107*         Microbiology:   Reviewed    Imaging:   01/27/21 [place] Abd:   Impression   Unremarkable right upper quadrant ultrasound     01/26/21 CT abd:   Impression   1.  Trace to small pleural effusions in both lung bases.   2.  Left upper abdominal small bowel intussusception, most often a transient and   self-limited process.   3.  Cholecystectomy.       Signed  By: Katheran James, MD     January 28, 2021

## 2021-01-28 NOTE — Procedures (Addendum)
PICC Placement Note    PRE-PROCEDURE VERIFICATION    Correct Procedure: yes  Time out completed with assistant Baldwin [place], RN, VA-BC and all persons present in agreement with time out.  Risks and benefits reviewed with Patient and family and informed consent obtained from mother Joely Losier via telephone prior to assessment and procedure.     Correct Site: yes  Temperature: Temp: 99.1 ??F (37.3 ??C), Temperature Source:    Recent Labs     01/28/21  0337   BUN 9   PLT 115*   WBC 4.0*     Allergies: Cow's milk; Food; Lac bovis; Albumen, egg; Dog epithelium allergy skin test; and Other  Education materials for PICC Care given to patient or family.    PROCEDURE DETAIL  A double lumen PICC line was started for Irritant/vesicant medication. The following documentation is in addition to the PICC properties in the lines/airways flowsheet:    Lot #: [id number]  Xylocaine used: Yes  Mid-Arm Circumference: 26 (cm)  Internal Catheter Length: 36 (cm)  External Catheter Length: 0 (cm)  Total Catheter Length: 36 (cm)  Vein Selection for PICC: right basilic  Central Line Insertion Bundle followed: Yes  Complication Related to Insertion: excessive bleeding at site- Surgicel and pressure dressing placed with hemostasis achieved.     Both the insertion guidewire and ECG guidewire were removed intact all ports have positive blood return and were flush well with normal saline.    The location of the tip of the PICC is verified using ECG technology.  The tip is in the SVC per ECG reading.  See image below.     Line is okay to use: yes            Penny Pia, RN

## 2021-01-28 NOTE — Interdisciplinary (Signed)
Multi-D Rounds/Checklist (leapfrog):  Lines: can any be removed?: None       DVT Prophylaxis: Contraindicated  Vent: N/A  Nutrition Ordered/appropriate: Ordered  Bowel Movement last 2 days: Yes  Can antibiotics or other drugs be stopped? Is Nozin performed: Per Primary Team  Consults needed: None  A: Is pain control adequate? (has PRNs? Stop drip?) Yes  B: Sedation break and SBT? N/A  C: Is sedation choice appropriate? N/A  D: Delirium/CAM-ICU? No  E: Mobility goals/appropriateness? Yes  F: Family update and plan? parent is primary contact and is being updated daily by primary attending and nursing staff.    Carma Leaven, APRN - CNP

## 2021-01-28 NOTE — Progress Notes (Signed)
Hospitalist Progress Note   Admit Date:  01/26/2021  5:01 AM   Name:  Robin Mendoza   Age:  20 y.o.  Sex:  female  DOB:  20-Dec-2000   MRN:  [id number]   Room:  3103/01    Presenting Complaint: Abdominal Pain     Reason(s) for Admission: Hypokalemia [E87.6]  Hypomagnesemia [E83.42]  Intussusception of small bowel ([place]) [K56.1]  Septic shock ([place]) [A41.9, R65.21]  Sepsis ([place]) [A41.9]  Non-intractable vomiting with nausea, unspecified vomiting type [R11.2]     Hospital Course:   20 y.o. female with medical history of  highly functional autism, depression, chronic gastroparesis followed by [facility], who presented with nausea, emesis and abdominal pain.  Mother Dr. [name] is at bedside and relates history.   She has had a local GI workup s/op EGD/ colonoscopy. Admitted to [facility] for electrolyte issues. Seen at [facility] and recommending motegrity with the diagnosis of idiopathic gastroparesis.   She has not started the motegrity yet but has this on hand.  Continues to have nausea/ emesis abdominal pain. Poor oral intake. No longer able to function well/ stopped working.   Has been getting IVF at home.   Meets sepsis criteria due to lactic acidosis, fever, tachycardia, hypotension.   S/p vancomycin/ zosyn.   Blood cultures ordered.   CXR negative.   UA negative.   COVID and flu negative.   She has electrolytes that are low and being supplemented.   CTAP shows"      Impression   1.  Trace to small pleural effusions in both lung bases.   2.  Left upper abdominal small bowel intussusception, most often a transient and   self-limited process.   3.  Cholecystectomy.      Today, BP dropping, tachycardic, sleepy, no fever        Subjective & 24hr Events (01/28/21):   Pt awake,alert ,didn't require pressors, blood pressure stable  On clear liquid diet  C/o mild abdominal pain      Assessment & Plan:     Septic shock, unclear source, intra-abdominal?. With elevated LFT and mild pancytopenia. CT abdomen is negative  for that matter  but had colitis in a recent scan in June.  9/19 didn't require pressors  Blood pressure stable    Hypokalemia, Hypophosphatemia, Hypomagnesemia. Replaced   9/19- resolved    Hx of Gastroparesis  Gastroenterology following  On clear liquids    C/o itching after morphine  No wheals  Prn benadryl    History of Autism and Depression  Cont home medication            Hospital Problems:  Principal Problem:    Sepsis ([place])  Active Problems:    Hypokalemia    Hypophosphatemia    Hypomagnesemia    Gastroparesis    Elevated liver enzymes    Autism    Depression    Weight loss  Resolved Problems:    * No resolved hospital problems. *      Objective:   Patient Vitals for the past 24 hrs:   Temp Pulse Resp BP SpO2   01/28/21 0900 -- 62 22 (!) 149/76 95 %   01/28/21 0800 99 ??F (37.2 ??C) 61 19 128/73 90 %   01/28/21 0700 -- 94 (!) 31 106/64 94 %   01/28/21 0600 -- 59 24 129/68 93 %   01/28/21 0500 -- 56 27 131/85 94 %   01/28/21 0400 -- 55 23 (!) 139/93 94 %  01/28/21 0300 -- 59 (!) 31 121/81 92 %   01/28/21 0200 98.7 ??F (37.1 ??C) 95 25 (!) 109/58 97 %   01/28/21 0100 -- 53 17 132/89 95 %   01/27/21 2300 -- 61 30 (!) 128/90 96 %   01/27/21 2100 -- 75 (!) 58 132/83 92 %   01/27/21 2000 -- 68 (!) 34 123/84 100 %   01/27/21 1945 -- -- 24 -- --   01/27/21 1932 98.9 ??F (37.2 ??C) 63 (!) 31 123/88 94 %   01/27/21 1902 -- 90 19 -- 94 %   01/27/21 1832 -- 83 22 112/87 98 %   01/27/21 1802 -- 74 22 112/65 97 %   01/27/21 1732 -- 75 23 121/66 97 %   01/27/21 1702 -- 84 20 119/76 97 %   01/27/21 1632 -- 97 25 115/61 97 %   01/27/21 1602 -- 91 (!) 33 122/71 97 %   01/27/21 1532 -- 94 (!) 36 127/81 99 %   01/27/21 1502 -- 98 (!) 33 122/83 100 %   01/27/21 1432 -- 99 (!) 40 (!) 126/90 97 %   01/27/21 1402 -- 84 (!) 58 (!) 137/90 --   01/27/21 1302 98.8 ??F (37.1 ??C) 89 (!) 31 104/62 99 %   01/27/21 1232 -- 90 -- 124/84 99 %   01/27/21 1217 -- 87 14 120/67 100 %   01/27/21 1132 98.2 ??F (36.8 ??C) (!) 109 16 (!) 85/45 --   01/27/21  1030 -- (!) 108 16 115/60 --       Oxygen Therapy  SpO2: 95 %  Pulse Oximetry Type: Continuous  Pulse via Oximetry: 65 beats per minute  SPO2 High Alarm Limit: 100  SPO2 Low Alarm Limit POX: 90  Pulse Oximeter Device Mode: Continuous  Pulse Oximeter Device Location: Finger  O2 Device: None (Room air)  Oximetry Probe Site Changed: Yes  Skin Assessment: Clean, dry, & intact  Skin Protection for O2 Device: No  Oxygen Therapy: None (Room air)    Estimated body mass index is 18.97 kg/m?? as calculated from the following:    Height as of this encounter: '5\' 5"'  (1.651 m).    Weight as of this encounter: 114 lb (51.7 kg).    Intake/Output Summary (Last 24 hours) at 01/28/2021 1019  Last data filed at 01/27/2021 2200  Gross per 24 hour   Intake 1690 ml   Output 300 ml   Net 1390 ml         Physical Exam:     Blood pressure (!) 149/76, pulse 62, temperature 99 ??F (37.2 ??C), temperature source Oral, resp. rate 22, height '5\' 5"'  (1.651 m), weight 114 lb (51.7 kg), SpO2 95 %.  General:    Well nourished.    Head:  Normocephalic, atraumatic  Eyes:  Sclerae appear normal.  Pupils equally round.  ENT:  Nares appear normal, no drainage.  Moist oral mucosa  Neck:  No restricted ROM.  Trachea midline   CV:   RRR.  No m/r/g.  No jugular venous distension.  Lungs:   CTAB.  No wheezing, rhonchi, or rales.  Symmetric expansion.  Abdomen:   Mid abdominal tenderness,mild guarding, no rebound, normal bs+  Extremities: No cyanosis or clubbing.  No edema  Skin:     No rashes and normal coloration.   Warm and dry.    Neuro:  CN II-XII grossly intact.  Sensation intact.  A&Ox3  Psych:  Normal mood and affect.  I have personally reviewed labs and tests showing:  Recent Labs:  Recent Results (from the past 48 hour(s))   Culture, Blood 1    Collection Time: 01/26/21  1:49 PM    Specimen: Blood   Result Value Ref Range    Special Requests LEFT  Antecubital        Culture NO GROWTH 2 DAYS     Lactic Acid    Collection Time: 01/26/21  4:34 PM   Result  Value Ref Range    Lactic Acid, Plasma 2.9 (H) 0.4 - 2.0 MMOL/L   Basic Metabolic Panel    Collection Time: 01/26/21  4:34 PM   Result Value Ref Range    Sodium 140 136 - 145 mmol/L    Potassium 3.5 3.5 - 5.1 mmol/L    Chloride 109 101 - 110 mmol/L    CO2 24 21 - 32 mmol/L    Anion Gap 7 4 - 13 mmol/L    Glucose 94 65 - 100 mg/dL    BUN 12 6 - 23 MG/DL    Creatinine 0.70 0.6 - 1.0 MG/DL    GFR African American >60 >60 ml/min/1.84m    GFR Non-African American >60 >60 ml/min/1.795m   Calcium 8.2 (L) 8.3 - 10.4 MG/DL   Magnesium    Collection Time: 01/26/21  4:34 PM   Result Value Ref Range    Magnesium 2.0 1.8 - 2.4 mg/dL   Phosphorus    Collection Time: 01/26/21  4:34 PM   Result Value Ref Range    Phosphorus 2.0 (L) 2.5 - 4.5 MG/DL   Lactic Acid    Collection Time: 01/26/21  9:13 PM   Result Value Ref Range    Lactic Acid, Plasma 4.1 (HH) 0.4 - 2.0 MMOL/L   Lactic Acid    Collection Time: 01/27/21 12:25 AM   Result Value Ref Range    Lactic Acid, Plasma 2.9 (H) 0.4 - 2.0 MMOL/L   Comprehensive Metabolic Panel w/ Reflex to MG    Collection Time: 01/27/21  7:31 AM   Result Value Ref Range    Sodium 141 136 - 145 mmol/L    Potassium 3.7 3.5 - 5.1 mmol/L    Chloride 113 (H) 101 - 110 mmol/L    CO2 22 21 - 32 mmol/L    Anion Gap 6 4 - 13 mmol/L    Glucose 90 65 - 100 mg/dL    BUN 9 6 - 23 MG/DL    Creatinine 0.70 0.6 - 1.0 MG/DL    GFR African American >60 >60 ml/min/1.7393m  GFR Non-African American >60 >60 ml/min/1.48m51m Calcium 8.0 (L) 8.3 - 10.4 MG/DL    Total Bilirubin 2.6 (H) 0.2 - 1.1 MG/DL    ALT 125 (H) 12 - 65 U/L    AST 163 (H) 15 - 37 U/L    Alk Phosphatase 110 50 - 136 U/L    Total Protein 4.3 (L) 6.3 - 8.2 g/dL    Albumin 2.1 (L) 3.5 - 5.0 g/dL    Globulin 2.2 (L) 2.3 - 3.5 g/dL    Albumin/Globulin Ratio 1.0 (L) 1.2 - 3.5     CBC with Auto Differential    Collection Time: 01/27/21  7:31 AM   Result Value Ref Range    WBC 4.2 (L) 4.3 - 11.1 K/uL    RBC 3.59 (L) 4.05 - 5.2 M/uL    Hemoglobin 10.8 (L)  11.7 - 15.4 g/dL  Hematocrit 32.6 (L) 35.8 - 46.3 %    MCV 90.8 79.6 - 97.8 FL    MCH 30.1 26.1 - 32.9 PG    MCHC 33.1 31.4 - 35.0 g/dL    RDW 13.1 11.9 - 14.6 %    Platelets 148 (L) 150 - 450 K/uL    MPV 10.1 9.4 - 12.3 FL    nRBC 0.00 0.0 - 0.2 K/uL    Seg Neutrophils 85 (H) 43 - 78 %    Lymphocytes 10 (L) 13 - 44 %    Monocytes 3 (L) 4.0 - 12.0 %    Eosinophils % 0 (L) 0.5 - 7.8 %    Basophils 1 0.0 - 2.0 %    Immature Granulocytes 1 0.0 - 5.0 %    Segs Absolute 3.7 1.7 - 8.2 K/UL    Absolute Lymph # 0.4 (L) 0.5 - 4.6 K/UL    Absolute Mono # 0.1 0.1 - 1.3 K/UL    Absolute Eos # 0.0 0.0 - 0.8 K/UL    Basophils Absolute 0.0 0.0 - 0.2 K/UL    Absolute Immature Granulocyte 0.0 0.0 - 0.5 K/UL    RBC Comment NORMOCYTIC/NORMOCHROMIC      WBC Comment Result Confirmed By Smear      Platelet Comment SLIGHT      Differential Type AUTOMATED     Magnesium    Collection Time: 01/27/21  7:31 AM   Result Value Ref Range    Magnesium 1.8 1.8 - 2.4 mg/dL   Phosphorus    Collection Time: 01/27/21  7:31 AM   Result Value Ref Range    Phosphorus 2.0 (L) 2.5 - 4.5 MG/DL   Prealbumin    Collection Time: 01/27/21  7:31 AM   Result Value Ref Range    Prealbumin 10.9 (L) 18.0 - 35.7 MG/DL   Lactic Acid    Collection Time: 01/27/21  7:31 AM   Result Value Ref Range    Lactic Acid, Plasma 1.9 0.4 - 2.0 MMOL/L   C-Reactive Protein    Collection Time: 01/27/21  7:31 AM   Result Value Ref Range    CRP 13.0 (H) 0.0 - 0.9 mg/dL   Procalcitonin    Collection Time: 01/27/21  7:31 AM   Result Value Ref Range    Procalcitonin 0.68 (H) 0.00 - 0.49 ng/mL   CBC with Auto Differential    Collection Time: 01/28/21  3:37 AM   Result Value Ref Range    WBC 4.0 (L) 4.3 - 11.1 K/uL    RBC 3.49 (L) 4.05 - 5.2 M/uL    Hemoglobin 10.3 (L) 11.7 - 15.4 g/dL    Hematocrit 31.4 (L) 35.8 - 46.3 %    MCV 90.0 79.6 - 97.8 FL    MCH 29.5 26.1 - 32.9 PG    MCHC 32.8 31.4 - 35.0 g/dL    RDW 13.0 11.9 - 14.6 %    Platelets 115 (L) 150 - 450 K/uL    MPV 10.6 9.4 - 12.3  FL    nRBC 0.00 0.0 - 0.2 K/uL    Seg Neutrophils 71 43 - 78 %    Lymphocytes 23 13 - 44 %    Monocytes 6 4.0 - 12.0 %    Eosinophils % 0 (L) 0.5 - 7.8 %    Basophils 0 0.0 - 2.0 %    Immature Granulocytes 0 0.0 - 5.0 %    Segs Absolute 2.9 1.7 - 8.2 K/UL    Absolute  Lymph # 0.9 0.5 - 4.6 K/UL    Absolute Mono # 0.2 0.1 - 1.3 K/UL    Absolute Eos # 0.0 0.0 - 0.8 K/UL    Basophils Absolute 0.0 0.0 - 0.2 K/UL    Absolute Immature Granulocyte 0.0 0.0 - 0.5 K/UL    RBC Comment NORMOCYTIC/NORMOCHROMIC      WBC Comment Result Confirmed By Smear      Platelet Comment DECREASED      Differential Type AUTOMATED     Comprehensive Metabolic Panel    Collection Time: 01/28/21  3:37 AM   Result Value Ref Range    Sodium 141 136 - 145 mmol/L    Potassium 3.8 3.5 - 5.1 mmol/L    Chloride 115 (H) 101 - 110 mmol/L    CO2 20 (L) 21 - 32 mmol/L    Anion Gap 6 4 - 13 mmol/L    Glucose 112 (H) 65 - 100 mg/dL    BUN 9 6 - 23 MG/DL    Creatinine 0.50 (L) 0.6 - 1.0 MG/DL    GFR African American >60 >60 ml/min/1.47m    GFR Non-African American >60 >60 ml/min/1.735m   Calcium 8.1 (L) 8.3 - 10.4 MG/DL    Total Bilirubin 2.5 (H) 0.2 - 1.1 MG/DL    ALT 107 (H) 12 - 65 U/L    AST 125 (H) 15 - 37 U/L    Alk Phosphatase 168 (H) 50 - 136 U/L    Total Protein 4.6 (L) 6.3 - 8.2 g/dL    Albumin 2.2 (L) 3.5 - 5.0 g/dL    Globulin 2.4 2.3 - 3.5 g/dL    Albumin/Globulin Ratio 0.9 (L) 1.2 - 3.5     Hepatitis Panel, Acute    Collection Time: 01/28/21  3:37 AM   Result Value Ref Range    Hep A IgM NONREACTIVE NR      Hep B Core Ab, IgM NONREACTIVE NR      Hepatitis B Surface Ag NONREACTIVE NR      Hepatitis C Ab NONREACTIVE NR     Magnesium    Collection Time: 01/28/21  3:37 AM   Result Value Ref Range    Magnesium 2.1 1.8 - 2.4 mg/dL   Phosphorus    Collection Time: 01/28/21  3:37 AM   Result Value Ref Range    Phosphorus 2.8 2.5 - 4.5 MG/DL   Toxoplasma ABs, IgG/IgM    Collection Time: 01/28/21  3:37 AM   Result Value Ref Range    Toxoplasmosis IgG  PENDING IU/mL    Toxoplasmosis IgM PENDING AU/mL   Vancomycin Level, Random    Collection Time: 01/28/21  3:37 AM   Result Value Ref Range    Vancomycin Rm 17.1 UG/ML       I have personally reviewed imaging studies showing:  Other Studies:  [place]OM[place] LIMITED   Final Result   Unremarkable right upper quadrant ultrasound         CT ABDOMEN PELVIS W IV CONTRAST Additional Contrast? Oral   Final Result   1.  Trace to small pleural effusions in both lung bases.   2.  Left upper abdominal small bowel intussusception, most often a transient and   self-limited process.   3.  Cholecystectomy.         XR CHEST PORTABLE   Final Result   Normal chest x-ray.          Current Meds:  Current Facility-Administered Medications   Medication  Dose Route Frequency    morphine injection 4 mg  4 mg IntraVENous Q4H PRN    lidocaine 1 % injection 5 mL  5 mL IntraDERmal Once    sodium chloride flush 0.9 % injection 5-40 mL  5-40 mL IntraVENous 2 times per day    sodium chloride flush 0.9 % injection 5-40 mL  5-40 mL IntraVENous PRN    0.9 % sodium chloride infusion  25 mL IntraVENous PRN    sertraline (ZOLOFT) tablet 50 mg  50 mg Oral Daily    [START ON 01/29/2021] pantoprazole (PROTONIX) tablet 40 mg  40 mg Oral QAM AC    polyethylene glycol (GLYCOLAX) packet 17 g  17 g Oral Daily PRN    diphenhydrAMINE (BENADRYL) injection 25 mg  25 mg IntraVENous Q6H PRN    vancomycin (VANCOCIN) 750 mg in sodium chloride 0.9 % 250 mL IVPB (Vial2Bag)  750 mg IntraVENous Q8H    norepinephrine (LEVOPHED) 16 mg in sodium chloride 0.9 % 250 mL infusion  1-100 mcg/min IntraVENous Continuous    doxycycline (VIBRAMYCIN) 100 mg in sodium chloride 0.9 % 100 mL IVPB  100 mg IntraVENous Q12H    medicated lip ointment (BLISTEX)   Topical PRN    HYDROcodone-acetaminophen (NORCO) 5-325 MG per tablet 1 tablet  1 tablet Oral Q6H PRN    potassium chloride (KLOR-CON M) extended release tablet 40 mEq  40 mEq Oral PRN    Or    potassium bicarb-citric acid (EFFER-K)  effervescent tablet 40 mEq  40 mEq Oral PRN    Or    potassium chloride 10 mEq/100 mL IVPB (Peripheral Line)  10 mEq IntraVENous PRN    magnesium sulfate 2000 mg in 50 mL IVPB premix  2,000 mg IntraVENous PRN    sodium phosphate 10 mmol in sodium chloride 0.9 % 250 mL IVPB  10 mmol IntraVENous PRN    Or    sodium phosphate 15 mmol in sodium chloride 0.9 % 250 mL IVPB  15 mmol IntraVENous PRN    Or    sodium phosphate 20 mmol in sodium chloride 0.9 % 500 mL IVPB  20 mmol IntraVENous PRN    OLANZapine zydis (ZYPREXA) disintegrating tablet 5 mg  5 mg Oral Nightly    LORazepam (ATIVAN) injection 0.5 mg  0.5 mg IntraVENous Q6H PRN    0.9 % sodium chloride infusion   IntraVENous Continuous    0.9 % sodium chloride bolus  100 mL IntraVENous ONCE PRN    sodium chloride flush 0.9 % injection 10 mL  10 mL IntraVENous ONCE PRN    sodium chloride flush 0.9 % injection 5-40 mL  5-40 mL IntraVENous 2 times per day    sodium chloride flush 0.9 % injection 5-40 mL  5-40 mL IntraVENous PRN    0.9 % sodium chloride infusion   IntraVENous PRN    ondansetron (ZOFRAN-ODT) disintegrating tablet 4 mg  4 mg Oral Q8H PRN    Or    ondansetron (ZOFRAN) injection 4 mg  4 mg IntraVENous Q6H PRN    acetaminophen (TYLENOL) tablet 650 mg  650 mg Oral Q6H PRN    Or    acetaminophen (TYLENOL) suppository 650 mg  650 mg Rectal Q6H PRN    piperacillin-tazobactam (ZOSYN) 3,375 mg in sodium chloride 0.9 % 50 mL IVPB (mini-bag)  3,375 mg IntraVENous q8h       Signed:  Burna Forts, MD

## 2021-01-28 NOTE — Progress Notes (Signed)
Interdisciplinary rounds.      Cote Mayabb, MDIV, MRE  Chaplain

## 2021-01-28 NOTE — Progress Notes (Addendum)
ATTENDING NOTE:    AS BELOW  PT SLEEPING.  STATUS REVIEWED WITH FATHER  CT ON ADMISSION REVIEWED.  NO SURGICALLY SIGNIFICANT FINDINGS- INTUSSUSCEPTION IS INCIDENTAL.    SURGERY AVAILABLE IF INDICATED; WILL SIGN OFF.  DMJ        Admit Date: 01/26/2021  Hospital day 3    Subjective:     Patient still reports right sided abdominal pain all along the flank and right lower quadrant.  Pain is constant and dull.  She reports no bowel movements, no diarrhea.  She reports nausea no vomiting.    Scheduled Meds:   lidocaine 1 % injection  5 mL IntraDERmal Once    sodium chloride flush  5-40 mL IntraVENous 2 times per day    pantoprazole (PROTONIX) 40 mg injection  40 mg IntraVENous Daily    hydrocortisone sodium succinate PF  50 mg IntraVENous Q6H    doxycycline (VIBRAMYCIN) IV  100 mg IntraVENous Q12H    OLANZapine zydis  5 mg Oral Nightly    sodium chloride flush  5-40 mL IntraVENous 2 times per day    piperacillin-tazobactam  3,375 mg IntraVENous q8h    vancomycin  750 mg IntraVENous Q12H     Continuous Infusions:   sodium chloride      norepinephrine Stopped (01/27/21 1302)    sodium chloride 125 mL/hr at 01/28/21 0731    sodium chloride       PRN Meds:morphine, sodium chloride flush, sodium chloride, diphenhydrAMINE, medicated lip ointment, HYDROcodone-acetaminophen, potassium chloride **OR** potassium alternative oral replacement **OR** potassium chloride, magnesium sulfate, sodium phosphate IVPB **OR** sodium phosphate IVPB **OR** sodium phosphate IVPB, LORazepam, sodium chloride, sodium chloride flush, sodium chloride flush, sodium chloride, ondansetron **OR** ondansetron, acetaminophen **OR** acetaminophen    Review of Systems  Pertinent items are noted in HPI.    Objective:     Patient Vitals for the past 8 hrs:   BP Temp Temp src Pulse Resp SpO2   01/28/21 0700 106/64 -- -- 94 (!) 31 94 %   01/28/21 0600 129/68 -- -- 59 24 93 %   01/28/21 0500 131/85 -- -- 56 27 94 %   01/28/21 0400 (!) 139/93 -- -- 55 23 94 %    01/28/21 0300 121/81 -- -- 59 (!) 31 92 %   01/28/21 0200 (!) 109/58 98.7 ??F (37.1 ??C) Oral 95 25 97 %     I/O last 3 completed shifts:  In: 6123.6 [P.O.:200; I.V.:4983.6; IV Piggyback:940]  Out: 1400 [Urine:1400]  No intake/output data recorded.    WDWN in NAD  Alert and oriented x 3  Eyes: PERRL, sclerae white  ENT: external inspection of ears and nose normal, oropharynx normal  Abdomen: soft, on the right side all the way from the upper quadrant to the lower quadrant with no percussion or rebound tenderness. +BS all quads  Musculoskeletal: gait normal, digits without clubbing or cyanosis  Neurological: CN II-XII grossly normal, normal sensation and muscle strength bilaterally       Data ReviewCBC:   Lab Results   Component Value Date/Time    WBC 4.0 01/28/2021 03:37 AM    RBC 3.49 01/28/2021 03:37 AM     BMP:   Lab Results   Component Value Date/Time    GLUCOSE 112 01/28/2021 03:37 AM    CO2 20 01/28/2021 03:37 AM    BUN 9 01/28/2021 03:37 AM    CREATININE 0.50 01/28/2021 03:37 AM    CALCIUM 8.1 01/28/2021 03:37 AM  Assessment:     Principal Problem:    Sepsis ([place])  Active Problems:    Hypokalemia    Hypophosphatemia    Hypomagnesemia    Gastroparesis    Elevated liver enzymes    Autism    Depression    Weight loss  Resolved Problems:    * No resolved hospital problems. *      Plan:     Patient has abdominal pain of uncertain etiology.  Gen Surgery does not see any clear-cut indications for surgical intervention she has had multiple studies that have been reviewed including CT enteroscopy 11/08/20.  She may benefit from a CTA to rule out ischemia but at this point there is no indication for any surgical intervention but will defer to Dr Kathreen Devoid any additional diagnostics.   LFTs trending down with exception of Alk Phos

## 2021-01-28 NOTE — Interdisciplinary (Signed)
Interdisciplinary team rounds were held 01/28/2021 with the following team members:Care Management, Nursing, Nurse Practitioner, Palliative Care, Pastoral Care, Pharmacy, Physician, Respiratory Therapy, and Clinical Coordinator and the patient and mother.    Plan of care discussed. See clinical pathway and/or care plan for interventions and desired outcomes.

## 2021-01-29 ENCOUNTER — Encounter

## 2021-01-29 LAB — CBC WITH AUTO DIFFERENTIAL
Absolute Eos #: 0 10*3/uL (ref 0.0–0.8)
Absolute Immature Granulocyte: 0 10*3/uL (ref 0.0–0.5)
Absolute Lymph #: 1.7 10*3/uL (ref 0.5–4.6)
Absolute Mono #: 0.4 10*3/uL (ref 0.1–1.3)
Basophils Absolute: 0 10*3/uL (ref 0.0–0.2)
Basophils: 0 % (ref 0.0–2.0)
Eosinophils %: 0 % — ABNORMAL LOW (ref 0.5–7.8)
Hematocrit: 30.3 % — ABNORMAL LOW (ref 35.8–46.3)
Hemoglobin: 9.6 g/dL — ABNORMAL LOW (ref 11.7–15.4)
Immature Granulocytes: 1 % (ref 0.0–5.0)
Lymphocytes: 31 % (ref 13–44)
MCH: 28.7 PG (ref 26.1–32.9)
MCHC: 31.7 g/dL (ref 31.4–35.0)
MCV: 90.7 FL (ref 79.6–97.8)
MPV: 11.6 FL (ref 9.4–12.3)
Monocytes: 7 % (ref 4.0–12.0)
Platelets: 101 10*3/uL — ABNORMAL LOW (ref 150–450)
RBC: 3.34 M/uL — ABNORMAL LOW (ref 4.05–5.2)
RDW: 12.9 % (ref 11.9–14.6)
Seg Neutrophils: 62 % (ref 43–78)
Segs Absolute: 3.5 10*3/uL (ref 1.7–8.2)
WBC: 5.6 10*3/uL (ref 4.3–11.1)
nRBC: 0 10*3/uL (ref 0.0–0.2)

## 2021-01-29 LAB — EKG 12-LEAD
Atrial Rate: 49 {beats}/min
P Axis: 33 degrees
P-R Interval: 108 ms
Q-T Interval: 444 ms
QRS Duration: 74 ms
QTc Calculation (Bazett): 401 ms
R Axis: 54 degrees
T Axis: 28 degrees
Ventricular Rate: 49 {beats}/min

## 2021-01-29 LAB — BASIC METABOLIC PANEL W/ REFLEX TO MG FOR LOW K
Anion Gap: 5 mmol/L (ref 4–13)
BUN: 13 MG/DL (ref 6–23)
CO2: 24 mmol/L (ref 21–32)
Calcium: 8 MG/DL — ABNORMAL LOW (ref 8.3–10.4)
Chloride: 114 mmol/L — ABNORMAL HIGH (ref 101–110)
Creatinine: 0.6 MG/DL (ref 0.6–1.0)
GFR African American: >60 mL/min/{1.73_m2} (ref >60)
GFR Non-African American: >60 mL/min/{1.73_m2} (ref >60)
Glucose: 195 mg/dL — ABNORMAL HIGH (ref 65–100)
Potassium: 3.7 mmol/L (ref 3.5–5.1)
Sodium: 143 mmol/L (ref 136–145)

## 2021-01-29 LAB — TOXOPLASMA ABS, IGG/IGM
Toxoplasmosis IgG: <3 IU/mL (ref 0.0–7.1)
Toxoplasmosis IgM: <3 AU/mL (ref 0.0–7.9)

## 2021-01-29 LAB — PROTIME-INR
INR: 1.3
Protime: 16.7 s — ABNORMAL HIGH (ref 12.6–14.5)

## 2021-01-29 LAB — PROCALCITONIN: Procalcitonin: 0.2 ng/mL (ref 0.00–0.49)

## 2021-01-29 MED ORDER — PHENOL 1.4 % MT LIQD
1.4 % | OROMUCOSAL | Status: DC | PRN
Start: 2021-01-29 — End: 2021-01-31

## 2021-01-29 MED ORDER — POLYETHYLENE GLYCOL 3350 17 GM/SCOOP PO POWD
17 GM/SCOOP | Freq: Once | ORAL | Status: AC
Start: 2021-01-29 — End: 2021-01-29
  Administered 2021-01-29: 238 g via ORAL

## 2021-01-29 MED ORDER — PANTOPRAZOLE SODIUM 40 MG PO TBEC
40 MG | Freq: Two times a day (BID) | ORAL | Status: DC
Start: 2021-01-29 — End: 2021-01-31
  Administered 2021-01-29 – 2021-01-31 (×4): 40 mg via ORAL

## 2021-01-29 MED ORDER — NONFORMULARY
Freq: Every day | Status: DC
Start: 2021-01-29 — End: 2021-01-31
  Administered 2021-01-30 – 2021-01-31 (×2): 4 mg via ORAL

## 2021-01-29 MED ORDER — PRUCALOPRIDE SUCCINATE 2 MG PO TABS
2 MG | Freq: Every day | ORAL | Status: DC
Start: 2021-01-29 — End: 2021-01-31
  Administered 2021-01-29 – 2021-01-31 (×3): 2 mg via ORAL

## 2021-01-29 MED FILL — MOTEGRITY 2 MG PO TABS: 2 MG | ORAL | Qty: 30

## 2021-01-29 MED FILL — DOXY 100 100 MG IV SOLR: 100 MG | INTRAVENOUS | Qty: 100

## 2021-01-29 MED FILL — OLANZAPINE 5 MG PO TBDP: 5 MG | ORAL | Qty: 1

## 2021-01-29 MED FILL — VANCOMYCIN HCL 750 MG IV SOLR: 750 MG | INTRAVENOUS | Qty: 750

## 2021-01-29 MED FILL — KETOROLAC TROMETHAMINE 15 MG/ML IJ SOLN: 15 MG/ML | INTRAMUSCULAR | Qty: 1

## 2021-01-29 MED FILL — ONDANSETRON HCL 4 MG/2ML IJ SOLN: 4 MG/2ML | INTRAMUSCULAR | Qty: 2

## 2021-01-29 MED FILL — ONDANSETRON 4 MG PO TBDP: 4 MG | ORAL | Qty: 1

## 2021-01-29 MED FILL — SORE THROAT SPRAY 1.4 % MT LIQD: 1.4 % | OROMUCOSAL | Qty: 177

## 2021-01-29 MED FILL — PANTOPRAZOLE SODIUM 40 MG PO TBEC: 40 MG | ORAL | Qty: 1

## 2021-01-29 MED FILL — SERTRALINE HCL 50 MG PO TABS: 50 MG | ORAL | Qty: 1

## 2021-01-29 MED FILL — XYLOCAINE 1 % IJ SOLN: 1 % | INTRAMUSCULAR | Qty: 20

## 2021-01-29 MED FILL — PIPERACILLIN SOD-TAZOBACTAM SO 3.375 (3-0.375) G IV SOLR: 3.375 (3-0.375) g | INTRAVENOUS | Qty: 3375

## 2021-01-29 MED FILL — ACETAMINOPHEN 325 MG PO TABS: 325 MG | ORAL | Qty: 2

## 2021-01-29 MED FILL — PEG 3350 17 GM/SCOOP PO POWD: 17 GM/SCOOP | ORAL | Qty: 238

## 2021-01-29 MED FILL — ACETAMINOPHEN 650 MG RE SUPP: 650 MG | RECTAL | Qty: 1

## 2021-01-29 NOTE — Progress Notes (Signed)
VANCO DAILY FOLLOW UP NOTE  [facility] Pharmacokinetic Monitoring Service - Vancomycin    Consulting Provider: Dr. Laureen Ochs   Indication: Sepsis  Target Concentration: Goal AUC/MIC 400-600 mg*hr/L  Day of Therapy: 4  Additional Antimicrobials: Pip-Tazo    Pertinent Laboratory Values:   Wt Readings from Last 1 Encounters:   01/26/21 114 lb (51.7 kg)     Temp Readings from Last 1 Encounters:   01/29/21 98.1 ??F (36.7 ??C) (Oral)     Recent Labs     01/26/21  2113 01/27/21  0025 01/27/21  0731 01/28/21  0337 01/29/21  0257   BUN  --   --  9 9 13    CREATININE  --   --  0.70 0.50* 0.60   WBC  --   --  4.2* 4.0* 5.6   PROCAL  --   --  0.68*  --   --    LACACIDPL 4.1* 2.9* 1.9  --   --      Estimated Creatinine Clearance: 120 mL/min (based on SCr of 0.6 mg/dL).    Lab Results   Component Value Date/Time    VANCORANDOM 17.1 01/28/2021 03:37 AM       MRSA Nasal Swab: N/A. Non-respiratory infection.      Assessment:  Date/Time Dose Concentration AUC   9/19 0337 750 mg q 12 hours 17.1 305   Note: Serum concentrations collected for AUC dosing may appear elevated if collected in close proximity to the dose administered, this is not necessarily an indication of toxicity    Plan:  Current dosing regimen is sub-therapeutic  Dose increased to 750 mg q 8 hours for predicted AUC/Tr of 455/14.3  Repeat vancomycin concentrations will be ordered as clinically appropriate   Pharmacy will continue to monitor patient and adjust therapy as indicated    Thank you for the consult,  Zelphia [place] Nykolas Bacallao, [place]

## 2021-01-29 NOTE — Progress Notes (Signed)
[facility] Progress Note         Admit Date:  01/26/2021    Today's Date:  01/29/2021    CC:  Abdominal pain, nausea with vomiting    Subjective:     Per pts mother, pt had a small BM, and is now taking Motegrity daily.     Medications:   Current Facility-Administered Medications   Medication Dose Route Frequency    lidocaine 1 % injection 5 mL  5 mL IntraDERmal Once    sodium chloride flush 0.9 % injection 5-40 mL  5-40 mL IntraVENous 2 times per day    sodium chloride flush 0.9 % injection 5-40 mL  5-40 mL IntraVENous PRN    0.9 % sodium chloride infusion  25 mL IntraVENous PRN    sertraline (ZOLOFT) tablet 50 mg  50 mg Oral Daily    pantoprazole (PROTONIX) tablet 40 mg  40 mg Oral QAM AC    polyethylene glycol (GLYCOLAX) packet 17 g  17 g Oral Daily PRN    diphenhydrAMINE (BENADRYL) injection 25 mg  25 mg IntraVENous Q6H PRN    vancomycin (VANCOCIN) 750 mg in sodium chloride 0.9 % 250 mL IVPB (Vial2Bag)  750 mg IntraVENous Q8H    LORazepam (ATIVAN) injection 0.25 mg  0.25 mg IntraVENous Q6H PRN    ketorolac (TORADOL) injection 4.95 mg  4.95 mg IntraVENous Q6H PRN    doxycycline (VIBRAMYCIN) 100 mg in sodium chloride 0.9 % 100 mL IVPB  100 mg IntraVENous Q12H    medicated lip ointment (BLISTEX)   Topical PRN    potassium chloride (KLOR-CON M) extended release tablet 40 mEq  40 mEq Oral PRN    Or    potassium bicarb-citric acid (EFFER-K) effervescent tablet 40 mEq  40 mEq Oral PRN    Or    potassium chloride 10 mEq/100 mL IVPB (Peripheral Line)  10 mEq IntraVENous PRN    magnesium sulfate 2000 mg in 50 mL IVPB premix  2,000 mg IntraVENous PRN    sodium phosphate 10 mmol in sodium chloride 0.9 % 250 mL IVPB  10 mmol IntraVENous PRN    Or    sodium phosphate 15 mmol in sodium chloride 0.9 % 250 mL IVPB  15 mmol IntraVENous PRN    Or    sodium phosphate 20 mmol in sodium chloride 0.9 % 500 mL IVPB  20 mmol IntraVENous PRN    OLANZapine zydis (ZYPREXA) disintegrating tablet 5 mg  5 mg Oral Nightly    0.9  % sodium chloride infusion   IntraVENous Continuous    0.9 % sodium chloride bolus  100 mL IntraVENous ONCE PRN    sodium chloride flush 0.9 % injection 10 mL  10 mL IntraVENous ONCE PRN    sodium chloride flush 0.9 % injection 5-40 mL  5-40 mL IntraVENous 2 times per day    sodium chloride flush 0.9 % injection 5-40 mL  5-40 mL IntraVENous PRN    0.9 % sodium chloride infusion   IntraVENous PRN    ondansetron (ZOFRAN-ODT) disintegrating tablet 4 mg  4 mg Oral Q8H PRN    Or    ondansetron (ZOFRAN) injection 4 mg  4 mg IntraVENous Q6H PRN    acetaminophen (TYLENOL) tablet 650 mg  650 mg Oral Q6H PRN    Or    acetaminophen (TYLENOL) suppository 650 mg  650 mg Rectal Q6H PRN    piperacillin-tazobactam (ZOSYN) 3,375 mg in sodium chloride 0.9 % 50 mL IVPB (mini-bag)  3,375  mg IntraVENous q8h       Review of Systems:  ROS was obtained, with pertinent positives as listed above.  No chest pain or SOB.    Diet:  Regular; Vegan     Objective:   Vitals:  BP 129/63    Pulse (!) 43    Temp 98.1 ??F (36.7 ??C) (Oral)    Resp 14    Ht 5\' 5"  (1.651 m)    Wt 114 lb (51.7 kg)    SpO2 96%    BMI 18.97 kg/m??   Intake/Output:  09/20 0701 - 09/20 1900  In: -   Out: 300 [Urine:300]  09/18 1901 - 09/20 0700  In: 1817.1 [I.V.:600]  Out: 550 [Urine:550]  Exam:  General appearance: alert, cooperative, no distress  Lungs: clear to auscultation bilaterally anteriorly  Heart: regular rate and rhythm  Abdomen: soft, non-tender. Bowel sounds normal. No masses, no organomegaly  Extremities: extremities normal, atraumatic, no cyanosis or edema  Neuro:  alert and oriented    Data Review (Labs):    Recent Labs     01/26/21  1634 01/27/21  0731 01/28/21  0337 01/29/21  0257   WBC  --  4.2* 4.0* 5.6   HGB  --  10.8* 10.3* 9.6*   HCT  --  32.6* 31.4* 30.3*   PLT  --  148* 115* 101*   MCV  --  90.8 90.0 90.7   NA 140 141 141 143   K 3.5 3.7 3.8 3.7   CL 109 113* 115* 114*   CO2 24 22 20* 24   BUN 12 9 9 13    MG 2.0 1.8 2.1  --    AST  --  163* 125*  --     ALT  --  125* 107*  --      RUQ [place] 01/27/21  Impression   Unremarkable right upper quadrant ultrasound     CT of the Abdomen and Pelvis with contrast 01/26/21  Impression   1.  Trace to small pleural effusions in both lung bases.   2.  Left upper abdominal small bowel intussusception, most often a transient and   self-limited process.   3.  Cholecystectomy.     EGD on 08/29/19 for EoE, nausea, vomiting, atypical chest pain, revealed eosinophilic esophagitis. Esophageal bxs consistent with EoE.      EGD on 07/24/20 revealed EoE. Esopahgeal bxs with patchy chronic inflammation, negative for features of EoE.      Colonoscopy on 09/17/20 for right lower quadrant pain, altered bowel habits, abnormal CT, weight loss, rectal bleeding, was normal. Terminal ileum and random colon biopsies were benign. Repeat colonoscopy recommended at age 45.      Colonoscopy with [facility] on 11/20/20 with biopsy of distal ileum ~3 cm proximal to ICV if possible - area of inflammation on CTe) and colon biopsies.    Assessment:     Principal Problem:    Septic shock (HCC)  Active Problems:    Hypokalemia    Hypophosphatemia    Hypomagnesemia    Gastroparesis    Elevated liver enzymes    Autism    Depression    Weight loss  Resolved Problems:    * No resolved hospital problems. *    20 y.o. female with PMH listed above, who was seen in consultation on 01/26/21 at the request of Dr. Rosana Hoes for abdominal pain with N/V.  She has a history of months of RUQ pain that has defied evaluation.  There has been associated N/V with decrease eating, leading to weight loss with electrolyte dyscrasias. She has been evaluated by Drs. Kendall [facility] with [facility], Dr. Dot Been with [facility], and Dr. Terrace Arabia with [facility].  The most recent evaluation was at [facility], where there is a concern over gastroparesis and visceral hypersensitivity. Patient is being evaluated for sepsis.     RUQ US unremarkable on 01/27/21.   01/28/21 Albumin 2.2, ALP 168, ALT 107, AST 125,  Bilirubin 2.5.   01/29/21 LFT's ordered but not resulted.      Plan:     - Supportive care, maintain electrolytes, antiemetics   - Continue Motegrity 2 mg PO daily  - On Zyprexa 5 mg PO nightly  - On Protonix 40 mg PO daily  - Appreciate ID recommendations.      Harrel Lemon, APRN  [facility]     Patient is seen and examined in collaboration with Dr. Joya San.  Assessment and plan as per Dr. Ananias Pilgrim.

## 2021-01-29 NOTE — Interdisciplinary (Signed)
Multi-D Rounds/Checklist (leapfrog):  Lines: can any be removed?: None       DVT Prophylaxis: Contraindicated  Vent: N/A  Nutrition Ordered/appropriate: Ordered  Bowel Movement last 2 days: Yes  Can antibiotics or other drugs be stopped? Is Nozin performed: Per Primary Team  Consults needed: None  A: Is pain control adequate? (has PRNs? Stop drip?) Yes  B: Sedation break and SBT? N/A  C: Is sedation choice appropriate? N/A  D: Delirium/CAM-ICU? No  E: Mobility goals/appropriateness? Yes  F: Family update and plan? parent is primary contact and is being updated daily by primary attending and nursing staff.    Carma Leaven, APRN - CNP

## 2021-01-29 NOTE — Progress Notes (Signed)
TRANSFER - IN REPORT:    Verbal report received from [place] on Robin Mendoza  being received from ICU for routine progression of patient care      Report consisted of patient's Situation, Background, Assessment and   Recommendations(SBAR).     Information from the following report(s) Nurse Handoff Report, Index, ED SBAR, Adult Overview, Intake/Output, and MAR was reviewed with the receiving nurse.    Opportunity for questions and clarification was provided.      Assessment completed upon patient's arrival to unit and care assumed.

## 2021-01-29 NOTE — Progress Notes (Signed)
END OF SHIFT NOTE:    INTAKE/OUTPUT  09/19 0701 - 09/20 0700  In: 1817.1 [I.V.:600]  Out: 250 [Urine:250]  Voiding: Yes  Catheter: No  Drain:              Flatus: Patient does have flatus present.    Stool:  occurrences.    Characteristics:           Stool Assessment  Last BM (including prior to admit): 01/29/21 (per pt)    Emesis:  occurrences.    Characteristics:        VITAL SIGNS  Patient Vitals for the past 12 hrs:   Temp Pulse Resp BP SpO2   01/29/21 1708 97.7 ??F (36.5 ??C) (!) 48 -- 114/80 98 %   01/29/21 1155 98.2 ??F (36.8 ??C) (!) 46 20 111/78 98 %   01/29/21 0800 98.4 ??F (36.9 ??C) (!) 43 14 129/63 96 %   01/29/21 0700 -- (!) 45 14 118/78 96 %       Pain Assessment  @BSHSIFLOW (13)@  Pain Location: Abdomen  Patient's Stated Pain Goal: 4    Ambulating  Yes    Shift report given to oncoming nurse at the bedside.    Cristi Loron, RN

## 2021-01-29 NOTE — Progress Notes (Signed)
TRANSFER - OUT REPORT:    Verbal report given to chelsea, RN on Robin Mendoza  being transferred to 204 for routine progression of patient care       Report consisted of patient's Situation, Background, Assessment and   Recommendations(SBAR).     Information from the following report(s) Nurse Handoff Report, Index, Intake/Output, MAR, Recent Results, Med Rec Status, and Cardiac Rhythm sinus brady  was reviewed with the receiving nurse.    Lines:   PICC Double Lumen 17/61/60 Right Basilic (Active)   Central Line Being Utilized Yes 01/29/21 0800   Criteria for Appropriate Use Limited/no vessel suitable for conventional peripheral access 01/29/21 0800   Site Assessment Clean, dry & intact 01/29/21 0800   Phlebitis Assessment No symptoms 01/29/21 0800   Infiltration Assessment 0 01/29/21 0800   Extremity Circumference (cm) 26 cm 01/28/21 1435   External Catheter Length (cm) 0 cm 01/28/21 1435   Proximal Lumen Color/Status Red;Flushed;Capped;Blood return noted 01/29/21 0800   Distal Lumen Color/Status Purple;Flushed;Infusing 01/29/21 0800   Line Care Connections checked and tightened 01/29/21 0800   Alcohol Cap Used Yes 01/29/21 0800   Date of Last Dressing Change 01/29/21 01/29/21 1053   Dressing Type Transparent;Antimicrobial 01/29/21 1053   Dressing Status New dressing applied;Clean, dry & intact 01/29/21 1053   Dressing Intervention New;Dressing changed 01/29/21 1053       Peripheral IV 01/06/21 (Active)   Site Assessment Clean, dry & intact 01/25/21 1315   Phlebitis Assessment No symptoms 01/25/21 1315   Infiltration Assessment 0 01/25/21 1315   Dressing Type Transparent 01/25/21 1315        Opportunity for questions and clarification was provided.      Patient transported with:  Monitor, Patient-specific medications from Pharmacy, and Registered Nurse

## 2021-01-29 NOTE — Progress Notes (Signed)
[facility]IT[facility] OUTREACH NURSE PROGRESS REPORT      SUBJECTIVE: Called to assess patient secondary to outreach protocol.      @BSHSIFLOW ([id number])@  Vitals:    01/29/21 1708 01/29/21 1921 01/29/21 2240 01/29/21 2314   BP: 114/80 110/83  111/71   Pulse: (!) 48 (!) 48 62 50   Resp:  17  17   Temp: 97.7 ??F (36.5 ??C) 97.5 ??F (36.4 ??C)  98.1 ??F (36.7 ??C)   TempSrc: Oral Oral  Oral   SpO2: 98% 97%  97%   Weight:       Height:            LAB DATA:    Recent Labs     01/27/21  0731 01/28/21  0337 01/29/21  0257 01/29/21  1746   NA 141 141 143  --    K 3.7 3.8 3.7  --    CL 113* 115* 114*  --    CO2 22 20* 24  --    BUN 9 9 13   --    GFRAA >60 >60 >60  --    MG 1.8 2.1  --   --    PHOS 2.0* 2.8  --   --    GLOB 2.2* 2.4  --  2.2*   ALT 125* 107*  --  123*        Recent Labs     01/27/21  0731 01/28/21  0337 01/29/21  0257   WBC 4.2* 4.0* 5.6   HGB 10.8* 10.3* 9.6*   HCT 32.6* 31.4* 30.3*   PLT 148* 115* 101*          OBJECTIVE: On arrival to room, I found patient to be in bed playing a card game with family.      Pain Assessment  @BSHSIFLOW (1171)@             @BSHSIFLOW (1180)@             @BSHSIFLOW (0254)@              ASSESSMENT:  Pt is axo. Family at bedside. VSS. Pt currently drinking her miralax.     PLAN:  Will continue to monitor per protocol.

## 2021-01-29 NOTE — Consults (Signed)
Infectious Disease Consult    Today's Date: 01/29/2021   Admit Date: 01/26/2021    Impression:   SIRS / Sepsis - unclear etiology; she is now afebrile with normal wbc and procalcitonin  Small bowel intussusception   Abnormal LFTs; repeat pending  Abdominal pain  Chronic gastroparesis  Severe protein calorie malnutrition  Autism / ADHD / Depression  H/o cholecystectomy, appendectomy, 09/2020    Plan:   Stop empiric antibiotics  Her primary exposures are extensive travel -- most recently [place] and [place]; and rabbit exposure. The rabbit looked like a wild rabbit and was known to bite but did not bite her, and then died suddenly in July 07, 2020.   Pending workup for  Leptospirosis  Fasciola hepatica (serology not available; will keep looking for lab to complete)  Strongyloidiosis  Brucella ab  Blood smear looking for bartonella bacilliformis  Hepatitis A  Tularemia  karius next generation pcr    Anti-infectives:   IV vanc  IV zosyn    Subjective:   Afebrile.  RUQ pain unchanged.  No new complaints        Allergies   Allergen Reactions    Cow's Milk Anaphylaxis and Hives    Food Anaphylaxis    Lac Bovis Anaphylaxis    Albumen, Egg Other (See Comments)     No longer allergic      Dog Epithelium Allergy Skin Test Itching    Other Itching        Review of Systems:  A comprehensive review of systems is negative except as stated in the HPI.      Objective:     Visit Vitals  BP 110/83   Pulse (!) 48   Temp 97.5 ??F (36.4 ??C) (Oral)   Resp 17   Ht 5\' 5"  (1.651 m)   Wt 114 lb (51.7 kg)   SpO2 97%   BMI 18.97 kg/m??     Temp (24hrs), Avg:98 ??F (36.7 ??C), Min:97.5 ??F (36.4 ??C), Max:98.4 ??F (36.9 ??C)     Physical exam unchanged from yesterday    Lines:  Peripheral IV:       Physical Exam:    General:  Alert, cooperative but sleepy   Eyes:  Sclera anicteric. Peri-orbital edema   Mouth/Throat: Mucous membranes normal   Neck: Supple   Lungs:   Clear to auscultation bilaterally, good effort   CV:  Regular rate and rhythm,no murmur, click, rub or  gallop   Abdomen:   Soft, tender in the right upper quadrant   Extremities: No cyanosis; moderate extremity edema   Skin: Skin color, texture, turgor normal. no acute rash or lesions; no phlebitis   Neuro: Responsive, moves all extremities   Musculoskeletal: No swelling or deformity   Lines/Devices:  Intact, no erythema, drainage or tenderness   Psych: Alert and oriented, normal mood affect given the setting       Data Review:     CBC:  Recent Labs     01/27/21  0731 01/28/21  0337 01/29/21  0257   WBC 4.2* 4.0* 5.6   HGB 10.8* 10.3* 9.6*   HCT 32.6* 31.4* 30.3*   PLT 148* 115* 101*         BMP:  Recent Labs     01/27/21  0731 01/28/21  0337 01/29/21  0257   BUN 9 9 13    NA 141 141 143   K 3.7 3.8 3.7   CL 113* 115* 114*   CO2 22 20* 24  LFTS:  Recent Labs     01/27/21  0731 01/28/21  0337   ALT 125* 107*         Microbiology:   Reviewed    Imaging:   01/27/21 [place] Abd:   Impression   Unremarkable right upper quadrant ultrasound     01/26/21 CT abd:   Impression   1.  Trace to small pleural effusions in both lung bases.   2.  Left upper abdominal small bowel intussusception, most often a transient and   self-limited process.   3.  Cholecystectomy.       Signed By: Katheran James, MD     January 29, 2021

## 2021-01-29 NOTE — Progress Notes (Signed)
Hospitalist Progress Note   Admit Date:  01/26/2021  5:01 AM   Name:  Robin Mendoza   Age:  20 y.o.  Sex:  female  DOB:  August 28, 2000   MRN:  [id number]   Room:  3103/01    Presenting Complaint: Abdominal Pain     Reason(s) for Admission: Hypokalemia [E87.6]  Hypomagnesemia [E83.42]  Intussusception of small bowel ([place]) [K56.1]  Septic shock ([place]) [A41.9, R65.21]  Sepsis ([place]) [A41.9]  Non-intractable vomiting with nausea, unspecified vomiting type [R11.2]     Hospital Course:   20 y.o. female with medical history of  highly functional autism, depression, chronic gastroparesis followed by [facility], who presented with nausea, emesis and abdominal pain.  Mother Dr. [name] is at bedside and relates history.   She has had a local GI workup s/op EGD/ colonoscopy. Admitted to [facility] for electrolyte issues. Seen at [facility] and recommending motegrity with the diagnosis of idiopathic gastroparesis.   She has not started the motegrity yet but has this on hand.  Continues to have nausea/ emesis abdominal pain. Poor oral intake. No longer able to function well/ stopped working.   Has been getting IVF at home.   Meets sepsis criteria due to lactic acidosis, fever, tachycardia, hypotension.   S/p vancomycin/ zosyn.   Blood cultures ordered.   CXR negative.   UA negative.   COVID and flu negative.   She has electrolytes that are low and being supplemented.   CTAP shows"      Impression   1.  Trace to small pleural effusions in both lung bases.   2.  Left upper abdominal small bowel intussusception, most often a transient and   self-limited process.   3.  Cholecystectomy.      Today, BP dropping, tachycardic, sleepy, no fever        Subjective & 24hr Events (01/29/21):   Says feels better  Had PICC line placed yesterday  Most of cultures are negative  ID ordered [facility] next generation PCR  As per ID note yesterday cont emperic antibiotics for another 24 hrs until cultures negative          Assessment & Plan:      Septic shock, unclear source, intra-abdominal?. With elevated LFT and mild pancytopenia. CT abdomen is negative for that matter  but had colitis in a recent scan in June.  9/19 didn't require pressors  Blood pressure stable  9/20 - ID following,further recommendations regarding antibiotics as per ID.    Hypokalemia, Hypophosphatemia, Hypomagnesemia. Replaced   9/19- resolved    Hx of Gastroparesis  Gastroenterology following  On clear liquids    C/o itching after morphine  No wheals  Prn benadryl    History of Autism and Depression  Cont home medication    Asymptomatic bradycardia  Ordered ekg- no blocks, sinus bradycardia        Hospital Problems:  Principal Problem:    Septic shock ([place])  Active Problems:    Hypokalemia    Hypophosphatemia    Hypomagnesemia    Gastroparesis    Elevated liver enzymes    Autism    Depression    Weight loss  Resolved Problems:    * No resolved hospital problems. *      Objective:   Patient Vitals for the past 24 hrs:   Temp Pulse Resp BP SpO2   01/29/21 0800 -- (!) 43 14 129/63 96 %   01/29/21 0700 -- (!) 45 14 118/78 96 %  01/29/21 0545 -- (!) 47 18 -- 95 %   01/29/21 0530 -- (!) 45 19 -- 96 %   01/29/21 0515 -- (!) 46 23 -- 97 %   01/29/21 0500 -- (!) 45 17 120/77 97 %   01/29/21 0445 -- (!) 45 17 -- 97 %   01/29/21 0430 -- (!) 45 17 -- 97 %   01/29/21 0415 -- (!) 43 14 -- 97 %   01/29/21 0400 -- (!) 43 14 117/81 97 %   01/29/21 0345 -- 59 28 -- 95 %   01/29/21 0330 98.1 ??F (36.7 ??C) (!) 45 15 115/79 96 %   01/29/21 0315 -- (!) 47 -- -- 97 %   01/29/21 0300 -- (!) 45 17 -- 97 %   01/29/21 0245 -- 57 -- -- 96 %   01/29/21 0230 -- (!) 49 14 -- 97 %   01/29/21 0215 -- (!) 48 16 -- 96 %   01/29/21 0200 -- (!) 47 18 -- 95 %   01/29/21 0145 -- (!) 47 13 -- 95 %   01/29/21 0130 -- (!) 46 14 -- 96 %   01/29/21 0115 -- (!) 48 15 -- 96 %   01/29/21 0100 -- 53 15 -- 96 %   01/29/21 0045 -- 52 15 -- 96 %   01/29/21 0030 -- 52 15 -- 96 %   01/29/21 0015 -- 53 14 -- 96 %   01/29/21 0000 --  50 14 -- 96 %   01/28/21 2345 -- 58 18 -- 96 %   01/28/21 2330 98.2 ??F (36.8 ??C) 53 16 111/68 95 %   01/28/21 2315 -- 52 16 -- 95 %   01/28/21 2300 -- 51 15 -- 94 %   01/28/21 2245 -- 52 15 -- 94 %   01/28/21 2230 -- 51 13 -- 94 %   01/28/21 2215 -- 50 13 -- 94 %   01/28/21 2200 -- 52 29 118/75 96 %   01/28/21 2145 -- 61 19 -- 96 %   01/28/21 2130 -- 56 20 -- 97 %   01/28/21 2115 -- 54 24 -- 96 %   01/28/21 2100 -- 75 20 (!) 125/93 96 %   01/28/21 2045 -- 52 25 -- 96 %   01/28/21 2015 -- 73 -- -- 96 %   01/28/21 2000 -- 70 20 138/76 95 %   01/28/21 1945 -- 76 23 -- 96 %   01/28/21 1930 -- 70 20 -- 96 %   01/28/21 1915 -- 61 14 -- 99 %   01/28/21 1902 98.6 ??F (37 ??C) 55 14 (!) 111/56 99 %   01/28/21 1900 -- 62 14 -- 99 %   01/28/21 1845 -- 53 13 -- 99 %   01/28/21 1800 -- 58 15 (!) 100/58 99 %   01/28/21 1700 -- (!) 49 13 114/73 99 %   01/28/21 1500 98.8 ??F (37.1 ??C) 55 28 127/81 93 %   01/28/21 1400 -- 51 30 (!) 144/87 93 %   01/28/21 1300 -- 69 22 134/75 90 %   01/28/21 1200 -- 55 (!) 9 139/82 91 %   01/28/21 1100 99.1 ??F (37.3 ??C) 57 14 (!) 137/92 94 %       Oxygen Therapy  SpO2: 96 %  Pulse Oximetry Type: Continuous  Pulse via Oximetry: 43 beats per minute  SPO2 High Alarm Limit: 100  SPO2 Low Alarm Limit POX: 90  Pulse Oximeter Device  Mode: Continuous  Pulse Oximeter Device Location: Finger  O2 Device: None (Room air)  Oximetry Probe Site Changed: Yes  Skin Assessment: Clean, dry, & intact  Skin Protection for O2 Device: N/A  Oxygen Therapy: None (Room air)    Estimated body mass index is 18.97 kg/m?? as calculated from the following:    Height as of this encounter: '5\' 5"'  (1.651 m).    Weight as of this encounter: 114 lb (51.7 kg).    Intake/Output Summary (Last 24 hours) at 01/29/2021 1018  Last data filed at 01/29/2021 0800  Gross per 24 hour   Intake 1817.08 ml   Output 550 ml   Net 1267.08 ml         Physical Exam:     Blood pressure 129/63, pulse (!) 43, temperature 98.1 ??F (36.7 ??C), temperature source Oral,  resp. rate 14, height '5\' 5"'  (1.651 m), weight 114 lb (51.7 kg), SpO2 96 %.  General:    Well nourished.    Head:  Normocephalic, atraumatic  Eyes:  Sclerae appear normal.  Pupils equally round.  ENT:  Nares appear normal, no drainage.  Moist oral mucosa  Neck:  No restricted ROM.  Trachea midline   CV:   Bradycardia  No m/r/g.  No jugular venous distension.  Lungs:   CTAB.  No wheezing, rhonchi, or rales.  Symmetric expansion.  Abdomen:   Mid abdominal tenderness,mild guarding, no rebound, normal bs+  Extremities: No cyanosis or clubbing.  No edema  Skin:     No rashes and normal coloration.   Warm and dry.    Neuro:  CN II-XII grossly intact.  Sensation intact.  A&Ox3  Psych:  Normal mood and affect.      I have personally reviewed labs and tests showing:  Recent Labs:  Recent Results (from the past 48 hour(s))   CBC with Auto Differential    Collection Time: 01/28/21  3:37 AM   Result Value Ref Range    WBC 4.0 (L) 4.3 - 11.1 K/uL    RBC 3.49 (L) 4.05 - 5.2 M/uL    Hemoglobin 10.3 (L) 11.7 - 15.4 g/dL    Hematocrit 31.4 (L) 35.8 - 46.3 %    MCV 90.0 79.6 - 97.8 FL    MCH 29.5 26.1 - 32.9 PG    MCHC 32.8 31.4 - 35.0 g/dL    RDW 13.0 11.9 - 14.6 %    Platelets 115 (L) 150 - 450 K/uL    MPV 10.6 9.4 - 12.3 FL    nRBC 0.00 0.0 - 0.2 K/uL    Seg Neutrophils 71 43 - 78 %    Lymphocytes 23 13 - 44 %    Monocytes 6 4.0 - 12.0 %    Eosinophils % 0 (L) 0.5 - 7.8 %    Basophils 0 0.0 - 2.0 %    Immature Granulocytes 0 0.0 - 5.0 %    Segs Absolute 2.9 1.7 - 8.2 K/UL    Absolute Lymph # 0.9 0.5 - 4.6 K/UL    Absolute Mono # 0.2 0.1 - 1.3 K/UL    Absolute Eos # 0.0 0.0 - 0.8 K/UL    Basophils Absolute 0.0 0.0 - 0.2 K/UL    Absolute Immature Granulocyte 0.0 0.0 - 0.5 K/UL    RBC Comment NORMOCYTIC/NORMOCHROMIC      WBC Comment Result Confirmed By Smear      Platelet Comment DECREASED      Differential Type AUTOMATED  Comprehensive Metabolic Panel    Collection Time: 01/28/21  3:37 AM   Result Value Ref Range    Sodium 141 136  - 145 mmol/L    Potassium 3.8 3.5 - 5.1 mmol/L    Chloride 115 (H) 101 - 110 mmol/L    CO2 20 (L) 21 - 32 mmol/L    Anion Gap 6 4 - 13 mmol/L    Glucose 112 (H) 65 - 100 mg/dL    BUN 9 6 - 23 MG/DL    Creatinine 0.50 (L) 0.6 - 1.0 MG/DL    GFR African American >60 >60 ml/min/1.37m    GFR Non-African American >60 >60 ml/min/1.732m   Calcium 8.1 (L) 8.3 - 10.4 MG/DL    Total Bilirubin 2.5 (H) 0.2 - 1.1 MG/DL    ALT 107 (H) 12 - 65 U/L    AST 125 (H) 15 - 37 U/L    Alk Phosphatase 168 (H) 50 - 136 U/L    Total Protein 4.6 (L) 6.3 - 8.2 g/dL    Albumin 2.2 (L) 3.5 - 5.0 g/dL    Globulin 2.4 2.3 - 3.5 g/dL    Albumin/Globulin Ratio 0.9 (L) 1.2 - 3.5     Hepatitis Panel, Acute    Collection Time: 01/28/21  3:37 AM   Result Value Ref Range    Hep A IgM NONREACTIVE NR      Hep B Core Ab, IgM NONREACTIVE NR      Hepatitis B Surface Ag NONREACTIVE NR      Hepatitis C Ab NONREACTIVE NR     Magnesium    Collection Time: 01/28/21  3:37 AM   Result Value Ref Range    Magnesium 2.1 1.8 - 2.4 mg/dL   Phosphorus    Collection Time: 01/28/21  3:37 AM   Result Value Ref Range    Phosphorus 2.8 2.5 - 4.5 MG/DL   Toxoplasma ABs, IgG/IgM    Collection Time: 01/28/21  3:37 AM   Result Value Ref Range    Toxoplasmosis IgG PENDING IU/mL    Toxoplasmosis IgM PENDING AU/mL   Vancomycin Level, Random    Collection Time: 01/28/21  3:37 AM   Result Value Ref Range    Vancomycin Rm 17.1 UG/ML   Peripheral Blood Smear, MD Review    Collection Time: 01/28/21  3:49 PM   Result Value Ref Range    Peripheral Smear, MD Review PENDING    Basic Metabolic Panel w/ Reflex to MG    Collection Time: 01/29/21  2:57 AM   Result Value Ref Range    Sodium 143 136 - 145 mmol/L    Potassium 3.7 3.5 - 5.1 mmol/L    Chloride 114 (H) 101 - 110 mmol/L    CO2 24 21 - 32 mmol/L    Anion Gap 5 4 - 13 mmol/L    Glucose 195 (H) 65 - 100 mg/dL    BUN 13 6 - 23 MG/DL    Creatinine 0.60 0.6 - 1.0 MG/DL    GFR African American >60 >60 ml/min/1.7361m  GFR Non-African  American >60 >60 ml/min/1.3m80m Calcium 8.0 (L) 8.3 - 10.4 MG/DL   CBC with Auto Differential    Collection Time: 01/29/21  2:57 AM   Result Value Ref Range    WBC 5.6 4.3 - 11.1 K/uL    RBC 3.34 (L) 4.05 - 5.2 M/uL    Hemoglobin 9.6 (L) 11.7 - 15.4 g/dL    Hematocrit 30.3 (  L) 35.8 - 46.3 %    MCV 90.7 79.6 - 97.8 FL    MCH 28.7 26.1 - 32.9 PG    MCHC 31.7 31.4 - 35.0 g/dL    RDW 12.9 11.9 - 14.6 %    Platelets 101 (L) 150 - 450 K/uL    MPV 11.6 9.4 - 12.3 FL    nRBC 0.00 0.0 - 0.2 K/uL    Differential Type AUTOMATED      Seg Neutrophils 62 43 - 78 %    Lymphocytes 31 13 - 44 %    Monocytes 7 4.0 - 12.0 %    Eosinophils % 0 (L) 0.5 - 7.8 %    Basophils 0 0.0 - 2.0 %    Immature Granulocytes 1 0.0 - 5.0 %    Segs Absolute 3.5 1.7 - 8.2 K/UL    Absolute Lymph # 1.7 0.5 - 4.6 K/UL    Absolute Mono # 0.4 0.1 - 1.3 K/UL    Absolute Eos # 0.0 0.0 - 0.8 K/UL    Basophils Absolute 0.0 0.0 - 0.2 K/UL    Absolute Immature Granulocyte 0.0 0.0 - 0.5 K/UL   EKG 12 Lead    Collection Time: 01/29/21 10:03 AM   Result Value Ref Range    Ventricular Rate 49 BPM    Atrial Rate 49 BPM    P-R Interval 108 ms    QRS Duration 74 ms    Q-T Interval 444 ms    QTc Calculation (Bazett) 401 ms    P Axis 33 degrees    R Axis 54 degrees    T Axis 28 degrees    Diagnosis Sinus bradycardia with short PR        I have personally reviewed imaging studies showing:  Other Studies:  US ABDOMEN LIMITED   Final Result   Unremarkable right upper quadrant ultrasound         CT ABDOMEN PELVIS W IV CONTRAST Additional Contrast? Oral   Final Result   1.  Trace to small pleural effusions in both lung bases.   2.  Left upper abdominal small bowel intussusception, most often a transient and   self-limited process.   3.  Cholecystectomy.         XR CHEST PORTABLE   Final Result   Normal chest x-ray.          Current Meds:  Current Facility-Administered Medications   Medication Dose Route Frequency    Prucalopride Succinate TABS 2 mg (Patient Supplied)  2 mg  Oral Daily    lidocaine 1 % injection 5 mL  5 mL IntraDERmal Once    sodium chloride flush 0.9 % injection 5-40 mL  5-40 mL IntraVENous 2 times per day    sodium chloride flush 0.9 % injection 5-40 mL  5-40 mL IntraVENous PRN    0.9 % sodium chloride infusion  25 mL IntraVENous PRN    sertraline (ZOLOFT) tablet 50 mg  50 mg Oral Daily    pantoprazole (PROTONIX) tablet 40 mg  40 mg Oral QAM AC    polyethylene glycol (GLYCOLAX) packet 17 g  17 g Oral Daily PRN    diphenhydrAMINE (BENADRYL) injection 25 mg  25 mg IntraVENous Q6H PRN    vancomycin (VANCOCIN) 750 mg in sodium chloride 0.9 % 250 mL IVPB (Vial2Bag)  750 mg IntraVENous Q8H    LORazepam (ATIVAN) injection 0.25 mg  0.25 mg IntraVENous Q6H PRN    ketorolac (TORADOL) injection 4.95 mg  4.95 mg IntraVENous Q6H PRN    doxycycline (VIBRAMYCIN) 100 mg in sodium chloride 0.9 % 100 mL IVPB  100 mg IntraVENous Q12H    medicated lip ointment (BLISTEX)   Topical PRN    potassium chloride (KLOR-CON M) extended release tablet 40 mEq  40 mEq Oral PRN    Or    potassium bicarb-citric acid (EFFER-K) effervescent tablet 40 mEq  40 mEq Oral PRN    Or    potassium chloride 10 mEq/100 mL IVPB (Peripheral Line)  10 mEq IntraVENous PRN    magnesium sulfate 2000 mg in 50 mL IVPB premix  2,000 mg IntraVENous PRN    sodium phosphate 10 mmol in sodium chloride 0.9 % 250 mL IVPB  10 mmol IntraVENous PRN    Or    sodium phosphate 15 mmol in sodium chloride 0.9 % 250 mL IVPB  15 mmol IntraVENous PRN    Or    sodium phosphate 20 mmol in sodium chloride 0.9 % 500 mL IVPB  20 mmol IntraVENous PRN    OLANZapine zydis (ZYPREXA) disintegrating tablet 5 mg  5 mg Oral Nightly    0.9 % sodium chloride infusion   IntraVENous Continuous    0.9 % sodium chloride bolus  100 mL IntraVENous ONCE PRN    sodium chloride flush 0.9 % injection 10 mL  10 mL IntraVENous ONCE PRN    sodium chloride flush 0.9 % injection 5-40 mL  5-40 mL IntraVENous 2 times per day    sodium chloride flush 0.9 % injection 5-40  mL  5-40 mL IntraVENous PRN    0.9 % sodium chloride infusion   IntraVENous PRN    ondansetron (ZOFRAN-ODT) disintegrating tablet 4 mg  4 mg Oral Q8H PRN    Or    ondansetron (ZOFRAN) injection 4 mg  4 mg IntraVENous Q6H PRN    acetaminophen (TYLENOL) tablet 650 mg  650 mg Oral Q6H PRN    Or    acetaminophen (TYLENOL) suppository 650 mg  650 mg Rectal Q6H PRN    piperacillin-tazobactam (ZOSYN) 3,375 mg in sodium chloride 0.9 % 50 mL IVPB (mini-bag)  3,375 mg IntraVENous q8h       Signed:  Burna Forts, MD

## 2021-01-30 LAB — CBC WITH AUTO DIFFERENTIAL
Absolute Eos #: 0.1 10*3/uL (ref 0.0–0.8)
Absolute Immature Granulocyte: 0 10*3/uL (ref 0.0–0.5)
Absolute Lymph #: 3.2 10*3/uL (ref 0.5–4.6)
Absolute Mono #: 0.4 10*3/uL (ref 0.1–1.3)
Basophils Absolute: 0 10*3/uL (ref 0.0–0.2)
Basophils: 0 % (ref 0.0–2.0)
Eosinophils %: 1 % (ref 0.5–7.8)
Hematocrit: 26.7 % — ABNORMAL LOW (ref 35.8–46.3)
Hemoglobin: 8.8 g/dL — ABNORMAL LOW (ref 11.7–15.4)
Immature Granulocytes: 0 % (ref 0.0–5.0)
Lymphocytes: 60 % — ABNORMAL HIGH (ref 13–44)
MCH: 29.3 PG (ref 26.1–32.9)
MCHC: 33 g/dL (ref 31.4–35.0)
MCV: 89 FL (ref 79.6–97.8)
MPV: 11.7 FL (ref 9.4–12.3)
Monocytes: 8 % (ref 4.0–12.0)
Platelets: 87 10*3/uL — ABNORMAL LOW (ref 150–450)
RBC: 3 M/uL — ABNORMAL LOW (ref 4.05–5.2)
RDW: 13.1 % (ref 11.9–14.6)
Seg Neutrophils: 31 % — ABNORMAL LOW (ref 43–78)
Segs Absolute: 1.6 10*3/uL — ABNORMAL LOW (ref 1.7–8.2)
WBC: 5.3 10*3/uL (ref 4.3–11.1)
nRBC: 0 10*3/uL (ref 0.0–0.2)

## 2021-01-30 LAB — PERIPHERAL BLOOD SMEAR, PATH REVIEW

## 2021-01-30 LAB — COMPREHENSIVE METABOLIC PANEL W/ REFLEX TO MG FOR LOW K
ALT: 119 U/L — ABNORMAL HIGH (ref 12–65)
AST: 119 U/L — ABNORMAL HIGH (ref 15–37)
Albumin/Globulin Ratio: 0.8 — ABNORMAL LOW (ref 1.2–3.5)
Albumin: 1.8 g/dL — ABNORMAL LOW (ref 3.5–5.0)
Alk Phosphatase: 226 U/L — ABNORMAL HIGH (ref 50–136)
Anion Gap: 4 mmol/L (ref 4–13)
BUN: 10 MG/DL (ref 6–23)
CO2: 23 mmol/L (ref 21–32)
Calcium: 7.5 MG/DL — ABNORMAL LOW (ref 8.3–10.4)
Chloride: 118 mmol/L — ABNORMAL HIGH (ref 101–110)
Creatinine: 0.5 MG/DL — ABNORMAL LOW (ref 0.6–1.0)
GFR African American: >60 mL/min/{1.73_m2} (ref >60)
GFR Non-African American: >60 mL/min/{1.73_m2} (ref >60)
Globulin: 2.2 g/dL — ABNORMAL LOW (ref 2.3–3.5)
Glucose: 81 mg/dL (ref 65–100)
Potassium: 3.3 mmol/L — ABNORMAL LOW (ref 3.5–5.1)
Sodium: 145 mmol/L (ref 136–145)
Total Bilirubin: 0.7 MG/DL (ref 0.2–1.1)
Total Protein: 4 g/dL — ABNORMAL LOW (ref 6.3–8.2)

## 2021-01-30 LAB — HEPATIC FUNCTION PANEL
ALT: 123 U/L — ABNORMAL HIGH (ref 12–65)
AST: 135 U/L — ABNORMAL HIGH (ref 15–37)
Albumin/Globulin Ratio: 1 — ABNORMAL LOW (ref 1.2–3.5)
Albumin: 2.1 g/dL — ABNORMAL LOW (ref 3.5–5.0)
Alk Phosphatase: 213 U/L — ABNORMAL HIGH (ref 50–136)
Bilirubin, Direct: 0.7 MG/DL — ABNORMAL HIGH (ref <0.4)
Globulin: 2.2 g/dL — ABNORMAL LOW (ref 2.3–3.5)
Total Bilirubin: 0.8 MG/DL (ref 0.2–1.1)
Total Protein: 4.3 g/dL — ABNORMAL LOW (ref 6.3–8.2)

## 2021-01-30 LAB — EHRLICHIA ANTIBODY PANEL
Ehrlichia Chaffeensis Ab, IgG: NEGATIVE
Ehrlichia Chaffeensis Ab, IgM: NEGATIVE
HGE IgG: NEGATIVE
HGE IgM: NEGATIVE

## 2021-01-30 LAB — MAGNESIUM: Magnesium: 1.8 mg/dL (ref 1.8–2.4)

## 2021-01-30 LAB — ROCKY MT SPOT IGM: Rickettsia Rickettsii, Igm: 0.37 index (ref 0.00–0.89)

## 2021-01-30 MED ORDER — PROMETHAZINE HCL 12.5 MG PO TABS
12.5 MG | Freq: Four times a day (QID) | ORAL | Status: DC | PRN
Start: 2021-01-30 — End: 2021-01-31
  Administered 2021-01-30 – 2021-01-31 (×5): 12.5 mg via ORAL

## 2021-01-30 MED ORDER — POTASSIUM CHLORIDE CRYS ER 20 MEQ PO TBCR
20 MEQ | Freq: Every day | ORAL | Status: DC
Start: 2021-01-30 — End: 2021-01-31
  Administered 2021-01-31: 13:00:00 40 meq via ORAL

## 2021-01-30 MED ORDER — MAGNESIUM OXIDE 400 (240 MG) MG PO TABS
400240 (240 Mg) MG | Freq: Every day | ORAL | Status: DC
Start: 2021-01-30 — End: 2021-01-31
  Administered 2021-01-30 – 2021-01-31 (×2): 400 mg via ORAL

## 2021-01-30 MED ORDER — ONDANSETRON 4 MG PO TBDP
4 MG | ORAL | Status: DC | PRN
Start: 2021-01-30 — End: 2021-01-31
  Administered 2021-01-30 – 2021-01-31 (×2): 4 mg via ORAL

## 2021-01-30 MED ORDER — ONDANSETRON HCL 4 MG/2ML IJ SOLN
4 MG/2ML | INTRAMUSCULAR | Status: DC | PRN
Start: 2021-01-30 — End: 2021-01-31
  Administered 2021-01-31: 14:00:00 4 mg via INTRAVENOUS

## 2021-01-30 MED ORDER — MORPHINE SULFATE 2 MG/ML IJ SOLN
2 MG/ML | INTRAMUSCULAR | Status: DC | PRN
Start: 2021-01-30 — End: 2021-01-31

## 2021-01-30 MED ORDER — PROMETHAZINE HCL 12.5 MG PO TABS
12.5 MG | Freq: Four times a day (QID) | ORAL | Status: DC | PRN
Start: 2021-01-30 — End: 2021-01-31

## 2021-01-30 MED ORDER — HYDROCODONE-ACETAMINOPHEN 5-325 MG PO TABS
5-325 MG | Freq: Four times a day (QID) | ORAL | Status: DC | PRN
Start: 2021-01-30 — End: 2021-01-31

## 2021-01-30 MED FILL — ACETAMINOPHEN 325 MG PO TABS: 325 MG | ORAL | Qty: 2

## 2021-01-30 MED FILL — KETOROLAC TROMETHAMINE 15 MG/ML IJ SOLN: 15 MG/ML | INTRAMUSCULAR | Qty: 1

## 2021-01-30 MED FILL — HYDROCODONE-ACETAMINOPHEN 5-325 MG PO TABS: 5-325 MG | ORAL | Qty: 1

## 2021-01-30 MED FILL — ONDANSETRON 4 MG PO TBDP: 4 MG | ORAL | Qty: 1

## 2021-01-30 MED FILL — SERTRALINE HCL 50 MG PO TABS: 50 MG | ORAL | Qty: 1

## 2021-01-30 MED FILL — PANTOPRAZOLE SODIUM 40 MG PO TBEC: 40 MG | ORAL | Qty: 1

## 2021-01-30 MED FILL — MAGNESIUM OXIDE 400 (240 MG) MG PO TABS: 400 (240 Mg) MG | ORAL | Qty: 1

## 2021-01-30 MED FILL — ONDANSETRON HCL 4 MG/2ML IJ SOLN: 4 MG/2ML | INTRAMUSCULAR | Qty: 2

## 2021-01-30 MED FILL — OLANZAPINE 5 MG PO TBDP: 5 MG | ORAL | Qty: 1

## 2021-01-30 MED FILL — PROMETHAZINE HCL 12.5 MG PO TABS: 12.5 MG | ORAL | Qty: 1

## 2021-01-30 NOTE — Progress Notes (Signed)
Hospitalist Progress Note   Admit Date:  01/26/2021  5:01 AM   Name:  Robin Mendoza   Age:  20 y.o.  Sex:  female  DOB:  October 11, 2000   MRN:  [id number]   Room:  204/01      Hospital Course:     20 y.o. female with medical history of  highly functional autism, depression, chronic gastroparesis followed by [facility], who presented with nausea, emesis and abdominal pain.  Mother Dr. [name] is at bedside and relates history.   She has had a local GI workup s/op EGD/ colonoscopy. Admitted to [facility] for electrolyte issues. Seen at [facility] and recommending motegrity with the diagnosis of idiopathic gastroparesis.   She has not started the motegrity yet but has this on hand.  Continues to have nausea/ emesis abdominal pain. Poor oral intake. No longer able to function well/ stopped working.   Has been getting IVF at home.   Meets sepsis criteria due to lactic acidosis, fever, tachycardia, hypotension.   S/p vancomycin/ zosyn.   Blood cultures ordered.   CXR negative.   UA negative.   COVID and flu negative.   She has electrolytes that are low and being supplemented.   CTAP shows"      Impression   1.  Trace to small pleural effusions in both lung bases.   2.  Left upper abdominal small bowel intussusception, most often a transient and   self-limited process.   3.  Cholecystectomy.      Today, vitals stable, nauseated but no vomiting, awake alert look much better, no fever, mild abd pain    Assessment & Plan:     Septic shock, resolved. Unclear source, intra-abdominal?. With elevated LFT and mild pancytopenia. CT abdomen is negative for that matter  but had colitis in a recent scan in June.  Off empiric abx, per ID    Hypokalemia, Hypophosphatemia, Hypomagnesemia. Replaced     Hx of Gastroparesis  Gastroenterology following  On symptomatic Rx      History of Autism and Depression  Cont home medication    Asymptomatic bradycardia  Ordered ekg- no blocks, sinus bradycardia      Objective:   Patient Vitals for  the past 24 hrs:   Temp Pulse Resp BP SpO2   01/30/21 0716 97.6 ??F (36.4 ??C) 59 18 114/81 97 %   01/30/21 0439 97.7 ??F (36.5 ??C) 50 17 102/68 99 %   01/30/21 0144 -- 50 -- -- --   01/29/21 2314 98.1 ??F (36.7 ??C) 50 17 111/71 97 %   01/29/21 2240 -- 62 -- -- --   01/29/21 2110 -- 51 -- -- --   01/29/21 1921 97.5 ??F (36.4 ??C) (!) 48 17 110/83 97 %   01/29/21 1708 97.7 ??F (36.5 ??C) (!) 48 -- 114/80 98 %   01/29/21 1155 98.2 ??F (36.8 ??C) (!) 46 20 111/78 98 %         Oxygen Therapy  SpO2: 97 %  Pulse Oximetry Type: Continuous  Pulse via Oximetry: 43 beats per minute  SPO2 High Alarm Limit: 100  SPO2 Low Alarm Limit POX: 90  Pulse Oximeter Device Mode: Continuous  Pulse Oximeter Device Location: Finger  O2 Device: None (Room air)  Oximetry Probe Site Changed: Yes  Skin Assessment: Clean, dry, & intact  Skin Protection for O2 Device: N/A  Oxygen Therapy: None (Room air)    Estimated body mass index is 19.74 kg/m?? as calculated from the following:  Height as of this encounter: '5\' 5"'  (1.651 m).    Weight as of this encounter: 118 lb 9.6 oz (53.8 kg).    Intake/Output Summary (Last 24 hours) at 01/30/2021 1039  Last data filed at 01/30/2021 0521  Gross per 24 hour   Intake 513 ml   Output 600 ml   Net -87 ml           Physical Exam:     Blood pressure 114/81, pulse 59, temperature 97.6 ??F (36.4 ??C), temperature source Oral, resp. rate 18, height '5\' 5"'  (1.651 m), weight 118 lb 9.6 oz (53.8 kg), SpO2 97 %.  General:    Well nourished.  Mild distress  CV:   Bradycardia  No m/r/g.   Lungs:   CTAB.  No wheezing, rhonchi, or rales.  Symmetric expansion.  Abdomen:   Mid mild abdominal tenderness, no rebound, normal bs+  Extremities: No cyanosis or clubbing.  No edema  Skin:     No rashes and normal coloration.   Warm and dry.    Neuro:  grossly intact.  A&Ox3  Psych:  Normal mood and affect.      I have personally reviewed labs and tests showing:  Recent Labs:  Recent Results (from the past 48 hour(s))   Peripheral Blood Smear, MD  Review    Collection Time: 01/28/21  3:49 PM   Result Value Ref Range    Peripheral Smear, MD Review PENDING    Basic Metabolic Panel w/ Reflex to MG    Collection Time: 01/29/21  2:57 AM   Result Value Ref Range    Sodium 143 136 - 145 mmol/L    Potassium 3.7 3.5 - 5.1 mmol/L    Chloride 114 (H) 101 - 110 mmol/L    CO2 24 21 - 32 mmol/L    Anion Gap 5 4 - 13 mmol/L    Glucose 195 (H) 65 - 100 mg/dL    BUN 13 6 - 23 MG/DL    Creatinine 0.60 0.6 - 1.0 MG/DL    GFR African American >60 >60 ml/min/1.63m    GFR Non-African American >60 >60 ml/min/1.719m   Calcium 8.0 (L) 8.3 - 10.4 MG/DL   CBC with Auto Differential    Collection Time: 01/29/21  2:57 AM   Result Value Ref Range    WBC 5.6 4.3 - 11.1 K/uL    RBC 3.34 (L) 4.05 - 5.2 M/uL    Hemoglobin 9.6 (L) 11.7 - 15.4 g/dL    Hematocrit 30.3 (L) 35.8 - 46.3 %    MCV 90.7 79.6 - 97.8 FL    MCH 28.7 26.1 - 32.9 PG    MCHC 31.7 31.4 - 35.0 g/dL    RDW 12.9 11.9 - 14.6 %    Platelets 101 (L) 150 - 450 K/uL    MPV 11.6 9.4 - 12.3 FL    nRBC 0.00 0.0 - 0.2 K/uL    Differential Type AUTOMATED      Seg Neutrophils 62 43 - 78 %    Lymphocytes 31 13 - 44 %    Monocytes 7 4.0 - 12.0 %    Eosinophils % 0 (L) 0.5 - 7.8 %    Basophils 0 0.0 - 2.0 %    Immature Granulocytes 1 0.0 - 5.0 %    Segs Absolute 3.5 1.7 - 8.2 K/UL    Absolute Lymph # 1.7 0.5 - 4.6 K/UL    Absolute Mono # 0.4 0.1 - 1.3 K/UL  Absolute Eos # 0.0 0.0 - 0.8 K/UL    Basophils Absolute 0.0 0.0 - 0.2 K/UL    Absolute Immature Granulocyte 0.0 0.0 - 0.5 K/UL   EKG 12 Lead    Collection Time: 01/29/21 10:03 AM   Result Value Ref Range    Ventricular Rate 49 BPM    Atrial Rate 49 BPM    P-R Interval 108 ms    QRS Duration 74 ms    Q-T Interval 444 ms    QTc Calculation (Bazett) 401 ms    P Axis 33 degrees    R Axis 54 degrees    T Axis 28 degrees    Diagnosis Sinus bradycardia with short PR    Hepatic Function Panel    Collection Time: 01/29/21  5:46 PM   Result Value Ref Range    Total Protein 4.3 (L) 6.3 - 8.2  g/dL    Albumin 2.1 (L) 3.5 - 5.0 g/dL    Globulin 2.2 (L) 2.3 - 3.5 g/dL    Albumin/Globulin Ratio 1.0 (L) 1.2 - 3.5      Total Bilirubin 0.8 0.2 - 1.1 MG/DL    Bilirubin, Direct 0.7 (H) <0.4 MG/DL    Alk Phosphatase 213 (H) 50 - 136 U/L    AST 135 (H) 15 - 37 U/L    ALT 123 (H) 12 - 65 U/L   Procalcitonin    Collection Time: 01/29/21  5:46 PM   Result Value Ref Range    Procalcitonin 0.20 0.00 - 0.49 ng/mL   Protime-INR    Collection Time: 01/29/21  5:46 PM   Result Value Ref Range    Protime 16.7 (H) 12.6 - 14.5 sec    INR 1.3     Comprehensive Metabolic Panel w/ Reflex to MG    Collection Time: 01/30/21  4:45 AM   Result Value Ref Range    Sodium 145 136 - 145 mmol/L    Potassium 3.3 (L) 3.5 - 5.1 mmol/L    Chloride 118 (H) 101 - 110 mmol/L    CO2 23 21 - 32 mmol/L    Anion Gap 4 4 - 13 mmol/L    Glucose 81 65 - 100 mg/dL    BUN 10 6 - 23 MG/DL    Creatinine 0.50 (L) 0.6 - 1.0 MG/DL    GFR African American >60 >60 ml/min/1.58m    GFR Non-African American >60 >60 ml/min/1.724m   Calcium 7.5 (L) 8.3 - 10.4 MG/DL    Total Bilirubin 0.7 0.2 - 1.1 MG/DL    ALT 119 (H) 12 - 65 U/L    AST 119 (H) 15 - 37 U/L    Alk Phosphatase 226 (H) 50 - 136 U/L    Total Protein 4.0 (L) 6.3 - 8.2 g/dL    Albumin 1.8 (L) 3.5 - 5.0 g/dL    Globulin 2.2 (L) 2.3 - 3.5 g/dL    Albumin/Globulin Ratio 0.8 (L) 1.2 - 3.5     CBC with Auto Differential    Collection Time: 01/30/21  4:45 AM   Result Value Ref Range    WBC 5.3 4.3 - 11.1 K/uL    RBC 3.00 (L) 4.05 - 5.2 M/uL    Hemoglobin 8.8 (L) 11.7 - 15.4 g/dL    Hematocrit 26.7 (L) 35.8 - 46.3 %    MCV 89.0 79.6 - 97.8 FL    MCH 29.3 26.1 - 32.9 PG    MCHC 33.0 31.4 - 35.0 g/dL  RDW 13.1 11.9 - 14.6 %    Platelets 87 (L) 150 - 450 K/uL    MPV 11.7 9.4 - 12.3 FL    nRBC 0.00 0.0 - 0.2 K/uL    Differential Type AUTOMATED      Seg Neutrophils 31 (L) 43 - 78 %    Lymphocytes 60 (H) 13 - 44 %    Monocytes 8 4.0 - 12.0 %    Eosinophils % 1 0.5 - 7.8 %    Basophils 0 0.0 - 2.0 %    Immature  Granulocytes 0 0.0 - 5.0 %    Segs Absolute 1.6 (L) 1.7 - 8.2 K/UL    Absolute Lymph # 3.2 0.5 - 4.6 K/UL    Absolute Mono # 0.4 0.1 - 1.3 K/UL    Absolute Eos # 0.1 0.0 - 0.8 K/UL    Basophils Absolute 0.0 0.0 - 0.2 K/UL    Absolute Immature Granulocyte 0.0 0.0 - 0.5 K/UL   Magnesium    Collection Time: 01/30/21  4:45 AM   Result Value Ref Range    Magnesium 1.8 1.8 - 2.4 mg/dL       I have personally reviewed imaging studies showing:  Other Studies:  US ABDOMEN LIMITED   Final Result   Unremarkable right upper quadrant ultrasound         CT ABDOMEN PELVIS W IV CONTRAST Additional Contrast? Oral   Final Result   1.  Trace to small pleural effusions in both lung bases.   2.  Left upper abdominal small bowel intussusception, most often a transient and   self-limited process.   3.  Cholecystectomy.         XR CHEST PORTABLE   Final Result   Normal chest x-ray.          Current Meds:  Current Facility-Administered Medications   Medication Dose Route Frequency    promethazine (PHENERGAN) tablet 12.5 mg  12.5 mg Oral Q6H PRN    ondansetron (ZOFRAN-ODT) disintegrating tablet 4 mg  4 mg Oral Q4H PRN    Or    ondansetron (ZOFRAN) injection 4 mg  4 mg IntraVENous Q4H PRN    promethazine (PHENERGAN) tablet 25 mg  25 mg Oral Q6H PRN    magnesium oxide (MAG-OX) tablet 400 mg  400 mg Oral Daily    [START ON 01/31/2021] potassium chloride (KLOR-CON M) extended release tablet 40 mEq  40 mEq Oral Daily with breakfast    Prucalopride Succinate TABS 2 mg (Patient Supplied)  2 mg Oral Daily    phenol 1.4 % mouth spray 1 spray  1 spray Mouth/Throat Q2H PRN    Guanfacine ER 4 mg (PATIENT SUPPLIED)  (Patient Supplied)  4 mg Oral Daily    pantoprazole (PROTONIX) tablet 40 mg  40 mg Oral BID AC    lidocaine 1 % injection 5 mL  5 mL IntraDERmal Once    sodium chloride flush 0.9 % injection 5-40 mL  5-40 mL IntraVENous 2 times per day    sodium chloride flush 0.9 % injection 5-40 mL  5-40 mL IntraVENous PRN    0.9 % sodium chloride infusion   25 mL IntraVENous PRN    sertraline (ZOLOFT) tablet 50 mg  50 mg Oral Daily    polyethylene glycol (GLYCOLAX) packet 17 g  17 g Oral Daily PRN    diphenhydrAMINE (BENADRYL) injection 25 mg  25 mg IntraVENous Q6H PRN    LORazepam (ATIVAN) injection 0.25 mg  0.25 mg IntraVENous  Q6H PRN    medicated lip ointment (BLISTEX)   Topical PRN    potassium chloride (KLOR-CON M) extended release tablet 40 mEq  40 mEq Oral PRN    Or    potassium bicarb-citric acid (EFFER-K) effervescent tablet 40 mEq  40 mEq Oral PRN    Or    potassium chloride 10 mEq/100 mL IVPB (Peripheral Line)  10 mEq IntraVENous PRN    magnesium sulfate 2000 mg in 50 mL IVPB premix  2,000 mg IntraVENous PRN    sodium phosphate 10 mmol in sodium chloride 0.9 % 250 mL IVPB  10 mmol IntraVENous PRN    Or    sodium phosphate 15 mmol in sodium chloride 0.9 % 250 mL IVPB  15 mmol IntraVENous PRN    Or    sodium phosphate 20 mmol in sodium chloride 0.9 % 500 mL IVPB  20 mmol IntraVENous PRN    OLANZapine zydis (ZYPREXA) disintegrating tablet 5 mg  5 mg Oral Nightly    0.9 % sodium chloride infusion   IntraVENous Continuous    0.9 % sodium chloride bolus  100 mL IntraVENous ONCE PRN    sodium chloride flush 0.9 % injection 10 mL  10 mL IntraVENous ONCE PRN    sodium chloride flush 0.9 % injection 5-40 mL  5-40 mL IntraVENous 2 times per day    sodium chloride flush 0.9 % injection 5-40 mL  5-40 mL IntraVENous PRN    0.9 % sodium chloride infusion   IntraVENous PRN    acetaminophen (TYLENOL) tablet 650 mg  650 mg Oral Q6H PRN    Or    acetaminophen (TYLENOL) suppository 650 mg  650 mg Rectal Q6H PRN       Signed:  Fonnie Jarvis, MD

## 2021-01-30 NOTE — Progress Notes (Signed)
[place] CRITICAL CARE OUTREACH NURSE PROGRESS REPORT      SUBJECTIVE: Called to assess patient secondary to transfer from critical care.      Vitals:    01/30/21 0144 01/30/21 0439 01/30/21 0521 01/30/21 0716   BP:  102/68  114/81   Pulse: 50 50  59   Resp:  17  18   Temp:  97.7 ??F (36.5 ??C)  97.6 ??F (36.4 ??C)   TempSrc:  Oral  Oral   SpO2:  99%  97%   Weight:   118 lb 9.6 oz (53.8 kg)    Height:            LAB DATA:    Recent Labs     01/28/21  0337 01/29/21  0257 01/29/21  1746 01/30/21  0445   NA 141 143  --  145   K 3.8 3.7  --  3.3*   CL 115* 114*  --  118*   CO2 20* 24  --  23   BUN 9 13  --  10   GFRAA >60 >60  --  >60   MG 2.1  --   --  1.8   PHOS 2.8  --   --   --    GLOB 2.4  --  2.2* 2.2*   ALT 107*  --  123* 119*        Recent Labs     01/28/21  0337 01/29/21  0257 01/30/21  0445   WBC 4.0* 5.6 5.3   HGB 10.3* 9.6* 8.8*   HCT 31.4* 30.3* 26.7*   PLT 115* 101* 87*          OBJECTIVE: On arrival to room, I found patient to be in bed awake playing on her phone.    ASSESSMENT:  Patient is alert and oriented. Denies any pain or SOB. Does not appear to be in any distress at this time. States she has become nauseated following miralax, and has discussed this with her providers. VSS with HR 60 and BP 114/81. Patient and family deny any additional needs or concerns at this time.     PLAN:  VS, labs, and progress notes reviewed. No concern per primary RN. Primary RN to call with concerns.Marland Kitchen

## 2021-01-30 NOTE — Progress Notes (Signed)
Outreach: Pt resting. No new changes.

## 2021-01-30 NOTE — Care Coordination-Inpatient (Signed)
Chart reviewed by [facility] and discussed in IDR. Patient transferred from ICU. ID following, on IV abx. Patient current with [facility], resume when stable for discharge.     CM following.

## 2021-01-30 NOTE — Progress Notes (Signed)
[facility]   DAILY PROGRESS NOTE    Admit Date:  01/26/2021    CC:  Chronic abdominal pain, nausea with vomiting, constipation    Problem List:  Principal Problem:    Septic shock ([place])  Active Problems:    Hypokalemia    Hypophosphatemia    Hypomagnesemia    Gastroparesis    Elevated liver enzymes    Autism    Depression    Weight loss  Resolved Problems:    * No resolved hospital problems. *    Robin Mendoza is a 20 y.o. female with PMH as listed below, who was seen in consultation on 01/26/21 at the request of Dr. Rosana Hoes for abdominal pain with N/V.  She has a history of months of RUQ pain that has defied evaluation.  There has been associated N/V with decrease eating, leading to weight loss with electrolyte dyscrasias. She has been evaluated by Drs. Kendall Flack with [facility], Dr. Dot Been with [facility], and Dr. Terrace Arabia with [facility].  The most recent evaluation was at [facility], where there is a concern over gastroparesis (patient has not had a GES) and visceral hypersensitivity. Patient is being managed for sepsis.  ID following.     RUQ US unremarkable on 01/27/21.     She did a MiraLax clean out on 01/29/21.    AST 119, ALT 119 (both trending down), ALP 226, and TB 0.7 today.    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  Added Phenergan per her request.  - Continue Zyprexa 5 mg PO qhs  - Continue Protonix 40 mg PO daily  - Continue Motegrity 2 mg PO daily  - Will ask Nutrition team to see her today    Joya San, MD   [facility]        Subjective:     Patient did a MiraLax clean out last night.  She seemed much happier today.  Less abdominal pain.  Some nausea but has not had any vomiting.      Medications:   Current  Facility-Administered Medications   Medication Dose Route Frequency Provider Last Rate Last Admin    Prucalopride Succinate TABS 2 mg (Patient Supplied)  2 mg Oral Daily Burna Forts, MD   2 mg at 01/29/21 1329    phenol 1.4 % mouth spray 1 spray  1 spray Mouth/Throat Q2H PRN Burna Forts, MD        Guanfacine ER 4 mg (PATIENT SUPPLIED)  (Patient Supplied)  4 mg Oral Daily Burna Forts, MD        pantoprazole (PROTONIX) tablet 40 mg  40 mg Oral BID AC Burna Forts, MD   40 mg at 01/30/21 0508    lidocaine 1 % injection 5 mL  5 mL IntraDERmal Once Earley Abide, MD        sodium chloride flush 0.9 % injection 5-40 mL  5-40 mL IntraVENous 2 times per day Earley Abide, MD   10 mL at 01/29/21 2133    sodium chloride flush 0.9 % injection 5-40 mL  5-40 mL IntraVENous PRN Earley Abide, MD   10 mL at 01/28/21 0818    0.9 % sodium chloride  infusion  25 mL IntraVENous PRN Earley Abide, MD        sertraline (ZOLOFT) tablet 50 mg  50 mg Oral Daily Burna Forts, MD   50 mg at 01/29/21 0841    polyethylene glycol (GLYCOLAX) packet 17 g  17 g Oral Daily PRN Burna Forts, MD   17 g at 01/28/21 1117    diphenhydrAMINE (BENADRYL) injection 25 mg  25 mg IntraVENous Q6H PRN Burna Forts, MD   25 mg at 01/28/21 1014    LORazepam (ATIVAN) injection 0.25 mg  0.25 mg IntraVENous Q6H PRN Burna Forts, MD   0.25 mg at 01/28/21 1430    ketorolac (TORADOL) injection 4.95 mg  4.95 mg IntraVENous Q6H PRN Burna Forts, MD   4.95 mg at 01/30/21 3762    medicated lip ointment (BLISTEX)   Topical PRN Fonnie Jarvis, MD        potassium chloride (KLOR-CON M) extended release tablet 40 mEq  40 mEq Oral PRN Fonnie Jarvis, MD        Or    potassium bicarb-citric acid (EFFER-K) effervescent tablet 40 mEq  40 mEq Oral PRN Fonnie Jarvis, MD        Or    potassium chloride 10 mEq/100 mL IVPB (Peripheral Line)  10 mEq IntraVENous PRN Fonnie Jarvis, MD        magnesium sulfate 2000 mg in 50 mL IVPB premix   2,000 mg IntraVENous PRN Fonnie Jarvis, MD        sodium phosphate 10 mmol in sodium chloride 0.9 % 250 mL IVPB  10 mmol IntraVENous PRN Fonnie Jarvis, MD        Or    sodium phosphate 15 mmol in sodium chloride 0.9 % 250 mL IVPB  15 mmol IntraVENous PRN Fonnie Jarvis, MD        Or    sodium phosphate 20 mmol in sodium chloride 0.9 % 500 mL IVPB  20 mmol IntraVENous PRN Fonnie Jarvis, MD        OLANZapine zydis (ZYPREXA) disintegrating tablet 5 mg  5 mg Oral Nightly Earley Abide, MD   5 mg at 01/29/21 2133    0.9 % sodium chloride infusion   IntraVENous Continuous Burna Forts, MD 50 mL/hr at 01/28/21 1239 Rate Change at 01/28/21 1239    0.9 % sodium chloride bolus  100 mL IntraVENous ONCE PRN Coben D Thorn, MD        sodium chloride flush 0.9 % injection 10 mL  10 mL IntraVENous ONCE PRN Coben D Thorn, MD        sodium chloride flush 0.9 % injection 5-40 mL  5-40 mL IntraVENous 2 times per day Jacqlyn Krauss, MD   10 mL at 01/29/21 2133    sodium chloride flush 0.9 % injection 5-40 mL  5-40 mL IntraVENous PRN Jacqlyn Krauss, MD   10 mL at 01/28/21 0819    0.9 % sodium chloride infusion   IntraVENous PRN Jacqlyn Krauss, MD        ondansetron (ZOFRAN-ODT) disintegrating tablet 4 mg  4 mg Oral Q8H PRN Jacqlyn Krauss, MD   4 mg at 01/29/21 1549    Or    ondansetron (ZOFRAN) injection 4 mg  4 mg IntraVENous Q6H PRN Jacqlyn Krauss, MD   4 mg at 01/30/21 0508    acetaminophen (TYLENOL) tablet 650 mg  650 mg Oral Q6H PRN Jacqlyn Krauss, MD   650 mg at 01/29/21 2024    Or  acetaminophen (TYLENOL) suppository 650 mg  650 mg Rectal Q6H PRN Jacqlyn Krauss, MD           Review of Systems:  ROS was obtained, with pertinent positives as listed above.  No chest pain or SOB.    Diet:  Regular    Objective:   Vitals:  BP 114/81    Pulse 59    Temp 97.6 ??F (36.4 ??C) (Oral)    Resp 18    Ht 5\' 5"  (1.651 m)    Wt 118 lb 9.6 oz (53.8 kg)    SpO2 97%    BMI 19.74 kg/m??   Intake/Output:  No  intake/output data recorded.  09/19 1901 - 09/21 0700  In: 1610 [I.V.:1113]  Out: 900 [Urine:900]  Exam:  General appearance: alert, cooperative, no distress.  Sitting up in bed.  Looks much better today.  Lungs: clear to auscultation bilaterally anteriorly  Heart: regular rate and rhythm  Abdomen: soft, non-tender. Bowel sounds normal.  Extremities: extremities normal, atraumatic, no cyanosis or edema  Neuro:  alert and oriented    Data Review (Labs):    Recent Labs     01/28/21  0337 01/29/21  0257 01/29/21  1746 01/30/21  0445   WBC 4.0* 5.6  --  5.3   HGB 10.3* 9.6*  --  8.8*   HCT 31.4* 30.3*  --  26.7*   PLT 115* 101*  --  87*   MCV 90.0 90.7  --  89.0   NA 141 143  --  145   K 3.8 3.7  --  3.3*   CL 115* 114*  --  118*   CO2 20* 24  --  23   BUN 9 13  --  10   CREATININE 0.50* 0.60  --  0.50*   CALCIUM 8.1* 8.0*  --  7.5*   MG 2.1  --   --  1.8   PHOS 2.8  --   --   --    GLUCOSE 112* 195*  --  81   AST 125*  --  135* 119*   ALT 107*  --  123* 119*   BILITOT 2.5*  --  0.8 0.7   BILIDIR  --   --  0.7*  --    ALBUMIN 0.9*  --  1.0* 0.8*   PROT 4.6*  --  4.3* 4.0*   INR  --   --  1.3  --          Joya San, MD  [facility]

## 2021-01-30 NOTE — Progress Notes (Signed)
END OF SHIFT NOTE:    Pt completed 3/4 of bowel prep. Multiple BMs.     INTAKE/OUTPUT  09/20 0701 - 09/21 0700  In: 562.8 [I.V.:513]  Out: 900 [Urine:900]  Voiding: Yes  Catheter: No  Drain:              Flatus: Patient does have flatus present.    Stool:  3 occurrences.    Characteristics:  Stool Appearance: Loose  Stool Color: Aggie Cosier  Stool Amount: Small  Stool Assessment  Incontinence: No  Stool Appearance: Loose  Stool Color: Aggie Cosier  Stool Amount: Small  Stool Source: Rectum  Last BM (including prior to admit): 01/30/21 (per pt)    Emesis:  occurrences.    Characteristics:        VITAL SIGNS  Patient Vitals for the past 12 hrs:   Temp Pulse Resp BP SpO2   01/30/21 0439 97.7 ??F (36.5 ??C) 50 17 102/68 99 %   01/30/21 0144 -- 50 -- -- --   01/29/21 2314 98.1 ??F (36.7 ??C) 50 17 111/71 97 %   01/29/21 2240 -- 62 -- -- --   01/29/21 2110 -- 51 -- -- --   01/29/21 1921 97.5 ??F (36.4 ??C) (!) 48 17 110/83 97 %       Pain Assessment  @BSHSIFLOW (13)@  Pain Location: Abdomen  Patient's Stated Pain Goal: 0 - No pain    Ambulating  Yes    Shift report given to oncoming nurse at the bedside.    MICHAELA WHITE, RN

## 2021-01-30 NOTE — Consults (Signed)
Comprehensive Nutrition Assessment    Type and Reason for Visit: Reassess  General Nutrition Management/other reason (Hospitalists) 9/18  Poor Intake/Appetite 5 or More Days (GI)    Nutrition Recommendations/Plan:   Meals and Snacks:  Diet: Continue current order    Vegan, Regular diet   Nutrition Supplement Therapy:  Medical food supplement therapy:  None   No appropriate oral nutrition supplements on formulary given food allergens ; encouraged family to provide vegan protein shakes from outside  Again, Pt is not appropriate for PN infusion as currently she has functional GI system and limited trials of solid foods thus far.  If EN is pursued pt would likely require G/J for primary needs and would require Dillard Essex or similar formula secondary to food allergen.   Nutrition Related Medication Management:  Electrolyte replacement protocol implemented.     Malnutrition Assessment:  Malnutrition Status: Insufficient data  No visual signs of fat or muscle wasting appreciated    Wt hx per chart review: Pediatric office: 125# 10/21/19, 123# 10/24/19, 112# 10/09/20.  113# 10/24/20 [facility] office.      Nutrition Assessment:  Nutrition History: Hx per mom at bedside.  Pt with hx of anaphylactic milk allergy and EoE.  Family is vegan.  Pt with ER presentation in February x 2 secondary accidental allergy ingestion with product changes.  Following this presentation pt presented with increasing GI difficulties that have persisted.  Pt with varied po intake at times accepts foods or liquids or neither.  Home IV infusions recently started after admission to [facility] for electrolyte repletion.  Infusions were determined by daily intake.    Idiopathic gastroparesis diagnosis at [facility] recommended pt only received first does yesterday.  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   "Weight trend: Loss (unitentional) 30-40 lbs. within 1-6 months. 31# in past 4 months" per [facility] RD assessment 11/23/20 on this  encounter wt is recorded as ?104#.      Do You Have Any Cultural, Religious, or Ethnic Food Preferences?: No   Nutrition Background:   History of  highly functional autism, depression, chronic gastroparesis followed by [facility], who presented with nausea, emesis and abdominal pain.  Admitted with septic shock.  Nutrition Interval:  NPO/CLD 9/17-9/19. Regular vegan diet 9/19.  Pt transferred from ICU to floor 9/20. Pt seen at beside, brother present. Pt reports she consumes foods from both her parents/family as well as meal trays. Pt reports intermittent nausea throughout the day, sipping on Silk Almond Chocolate milk. Brother states difficulties with fluid intake, pt reports dislike of water. Discussed alternative fluid options, brother to provide additional foods from home. Pt declined to provide any additional meal preferences, planned to try lunch tray after RD assessment. Discussed pt with RN.     IVF: NS @ 82m/hr  Lab Results   Component Value Date/Time    NA 145 01/30/2021 04:45 AM    K 3.3 01/30/2021 04:45 AM    CL 118 01/30/2021 04:45 AM    CO2 23 01/30/2021 04:45 AM    BUN 10 01/30/2021 04:45 AM    CREATININE 0.50 01/30/2021 04:45 AM    GLUCOSE 81 01/30/2021 04:45 AM    CALCIUM 7.5 01/30/2021 04:45 AM    PHOS 2.8 01/28/2021 03:37 AM    MG 1.8 01/30/2021 04:45 AM      Remarkable for increased Cl again, glucose stable, k depleted but replaced per provider.    Current Nutrition Therapies:  ADULT DIET; Regular; Vegan; gelatin  and popsicles only on clears    Current Intake:   Average Meal Intake: 1-25% (+ foods from outside) Average Supplements Intake: None Ordered      Anthropometric Measures:  Height: '5\' 5"'  (165.1 cm)  Current Body Wt: 118 lb 9.7 oz (53.8 kg) (9/21), Weight source: Bed Scale  BMI: 19.7, Normal Weight (BMI 18.5-24.9)  Admission Body Weight: 114 lb (51.7 kg) (stated)  Ideal Body Weight (Kg) (Calculated): 57 kg (125 lbs), 91.2 %  Usual Body Wt:  (135# per Mom), Percent weight change:          Edema:Peripheral Vascular  Peripheral Vascular (WDL): Within Defined Limits   BMI Category Normal Weight (BMI 18.5-24.9)  Estimated Daily Nutrient Needs:  Energy (kcal/day): 1345-1614 (25-30kcal/kg) (Kcal/kg Weight used: 53.8 kg Current  Protein (g/day): 54-67 (1-1.25g/kg) Weight Used: (Current) 53.8 kg  Fluid (ml/day):   (1 ml/kcal)    Nutrition Diagnosis:   Inadequate oral intake related to altered GI function (taste preferences) as evidenced by  (intermittent nausea, barriers to intake as above)    Nutrition Interventions:   Food and/or Nutrient Delivery: Continue Current Diet  Nutrition Education/Counseling: Education initiated  Coordination of Nutrition Care: Continue to monitor while inpatient  Plan of Care discussed with: Chelsea, RN    Goals:   Previous Goal Met: Goal(s) Achieved  Active Goal: PO intake 50% or greater, by next RD assessment       Nutrition Monitoring and Evaluation:      Food/Nutrient Intake Outcomes: Diet Advancement/Tolerance, Food and Nutrient Intake  Physical Signs/Symptoms Outcomes: Biochemical Data, GI Status, Meal Time Behavior, Weight    Discharge Planning:    Too soon to determine    Shanon Brow, RD

## 2021-01-30 NOTE — Progress Notes (Signed)
0500- Notified by telemetry tech that she was in [place] trying to convert to a bundle branch. Strip printed off doesn't look like V tach. Pt was going to the bathroom. When I got to room pt asymptomatic NS 72. Pt states "I did feel like my heart was fluttering." Pt has been sinus brady throughout shift. Dr. Leana Gamer notified. No new orders.

## 2021-01-30 NOTE — Progress Notes (Signed)
END OF SHIFT NOTE:    INTAKE/OUTPUT  09/20 0701 - 09/21 0700  In: 562.8 [I.V.:513]  Out: 900 [Urine:900]  Voiding: Yes  Catheter: No  Drain:              Flatus: Patient does have flatus present.    Stool:  occurrences.  4  Characteristics:  Stool Appearance: Loose, Formed  Stool Color: Brown  Stool Amount: Medium  Stool Assessment  Incontinence: No  Stool Appearance: Loose, Formed  Stool Color: Brown  Stool Amount: Medium  Stool Source: Rectum  Last BM (including prior to admit): 01/30/21    Emesis:  occurrences.    Characteristics:        VITAL SIGNS  Patient Vitals for the past 12 hrs:   Temp Pulse Resp BP SpO2   01/30/21 1536 98.4 ??F (36.9 ??C) 53 18 102/68 96 %   01/30/21 1149 98.9 ??F (37.2 ??C) 55 18 112/84 96 %   01/30/21 0716 97.6 ??F (36.4 ??C) 59 18 114/81 97 %       Pain Assessment  Pain Level: 5 (01/29/21 2233)  Pain Location: Abdomen  Patient's Stated Pain Goal: 0 - No pain    Ambulating  Yes    Shift report given to oncoming nurse at the bedside.    Cristi Loron, RN

## 2021-01-31 ENCOUNTER — Encounter

## 2021-01-31 DIAGNOSIS — A419 Sepsis, unspecified organism: Secondary | ICD-10-CM

## 2021-01-31 LAB — COMPREHENSIVE METABOLIC PANEL
ALT: 113 U/L — ABNORMAL HIGH (ref 12–65)
AST: 75 U/L — ABNORMAL HIGH (ref 15–37)
Albumin/Globulin Ratio: 0.9 — ABNORMAL LOW (ref 1.2–3.5)
Albumin: 2.1 g/dL — ABNORMAL LOW (ref 3.5–5.0)
Alk Phosphatase: 264 U/L — ABNORMAL HIGH (ref 50–136)
Anion Gap: 1 mmol/L — ABNORMAL LOW (ref 4–13)
BUN: 6 MG/DL (ref 6–23)
CO2: 30 mmol/L (ref 21–32)
Calcium: 8.2 MG/DL — ABNORMAL LOW (ref 8.3–10.4)
Chloride: 113 mmol/L — ABNORMAL HIGH (ref 101–110)
Creatinine: 0.5 MG/DL — ABNORMAL LOW (ref 0.6–1.0)
GFR African American: >60 mL/min/{1.73_m2} (ref >60)
GFR Non-African American: >60 mL/min/{1.73_m2} (ref >60)
Globulin: 2.4 g/dL (ref 2.3–3.5)
Glucose: 79 mg/dL (ref 65–100)
Potassium: 3.3 mmol/L — ABNORMAL LOW (ref 3.5–5.1)
Sodium: 144 mmol/L (ref 136–145)
Total Bilirubin: 0.6 MG/DL (ref 0.2–1.1)
Total Protein: 4.5 g/dL — ABNORMAL LOW (ref 6.3–8.2)

## 2021-01-31 LAB — CBC WITH AUTO DIFFERENTIAL
Absolute Eos #: 0.3 10*3/uL (ref 0.0–0.8)
Absolute Immature Granulocyte: 0 10*3/uL (ref 0.0–0.5)
Absolute Lymph #: 3.8 10*3/uL (ref 0.5–4.6)
Absolute Mono #: 0.8 10*3/uL (ref 0.1–1.3)
Basophils Absolute: 0 10*3/uL (ref 0.0–0.2)
Basophils: 1 % (ref 0.0–2.0)
Eosinophils %: 4 % (ref 0.5–7.8)
Hematocrit: 30.4 % — ABNORMAL LOW (ref 35.8–46.3)
Hemoglobin: 10.1 g/dL — ABNORMAL LOW (ref 11.7–15.4)
Immature Granulocytes: 1 % (ref 0.0–5.0)
Lymphocytes: 47 % — ABNORMAL HIGH (ref 13–44)
MCH: 29.6 PG (ref 26.1–32.9)
MCHC: 33.2 g/dL (ref 31.4–35.0)
MCV: 89.1 FL (ref 79.6–97.8)
MPV: 11.6 FL (ref 9.4–12.3)
Monocytes: 10 % (ref 4.0–12.0)
Platelets: 97 10*3/uL — ABNORMAL LOW (ref 150–450)
RBC: 3.41 M/uL — ABNORMAL LOW (ref 4.05–5.2)
RDW: 13 % (ref 11.9–14.6)
Seg Neutrophils: 38 % — ABNORMAL LOW (ref 43–78)
Segs Absolute: 3.1 10*3/uL (ref 1.7–8.2)
WBC: 8 10*3/uL (ref 4.3–11.1)
nRBC: 0 10*3/uL (ref 0.0–0.2)

## 2021-01-31 LAB — FOLATE: Folate: 13.4 ng/mL (ref 3.1–17.5)

## 2021-01-31 LAB — VITAMIN D 25 HYDROXY: Vit D, 25-Hydroxy: 22.7 ng/mL — ABNORMAL LOW (ref 30.0–100.0)

## 2021-01-31 LAB — VITAMIN B12: Vitamin B-12: 1099 pg/mL — ABNORMAL HIGH (ref 193–986)

## 2021-01-31 LAB — HAPTOGLOBIN: Haptoglobin: 84 mg/dL (ref 30–200)

## 2021-01-31 LAB — CULTURE, BLOOD 1
Culture: NO GROWTH
Culture: NO GROWTH

## 2021-01-31 LAB — BRUCELLA ANTIBODY IGM, EIA: Brucella Ab, IgG/M: NEGATIVE

## 2021-01-31 LAB — C-REACTIVE PROTEIN: CRP: 1.2 mg/dL — ABNORMAL HIGH (ref 0.0–0.9)

## 2021-01-31 LAB — FERRITIN: Ferritin: 90 NG/ML (ref 8–388)

## 2021-01-31 LAB — LACTATE DEHYDROGENASE: LD: 249 U/L — ABNORMAL HIGH (ref 100–190)

## 2021-01-31 LAB — TRANSFERRIN SATURATION
Iron: 83 ug/dL (ref 35–150)
TIBC: 213 ug/dL — ABNORMAL LOW (ref 250–450)
TRANSFERRIN SATURATION: 39 % (ref >20)

## 2021-01-31 MED ORDER — SANCUSO 3.1 MG/24HR TD PTCH
3.1 MG/24HR | MEDICATED_PATCH | TRANSDERMAL | 1 refills | Status: DC
Start: 2021-01-31 — End: 2021-02-25

## 2021-01-31 MED ORDER — PROMETHAZINE HCL 12.5 MG PO TABS
12.5 MG | ORAL_TABLET | Freq: Four times a day (QID) | ORAL | 0 refills | Status: AC | PRN
Start: 2021-01-31 — End: 2021-05-01

## 2021-01-31 MED FILL — ACETAMINOPHEN 325 MG PO TABS: 325 MG | ORAL | Qty: 2

## 2021-01-31 MED FILL — MAGNESIUM OXIDE 400 (240 MG) MG PO TABS: 400 (240 Mg) MG | ORAL | Qty: 1

## 2021-01-31 MED FILL — PANTOPRAZOLE SODIUM 40 MG PO TBEC: 40 MG | ORAL | Qty: 1

## 2021-01-31 MED FILL — PROMETHAZINE HCL 12.5 MG PO TABS: 12.5 MG | ORAL | Qty: 1

## 2021-01-31 MED FILL — SERTRALINE HCL 50 MG PO TABS: 50 MG | ORAL | Qty: 1

## 2021-01-31 MED FILL — POTASSIUM CHLORIDE CRYS ER 20 MEQ PO TBCR: 20 MEQ | ORAL | Qty: 2

## 2021-01-31 MED FILL — PROMETHAZINE HCL 12.5 MG PO TABS: 12.5 MG | ORAL | Qty: 2

## 2021-01-31 MED FILL — OLANZAPINE 5 MG PO TBDP: 5 MG | ORAL | Qty: 1

## 2021-01-31 MED FILL — ONDANSETRON HCL 4 MG/2ML IJ SOLN: 4 MG/2ML | INTRAMUSCULAR | Qty: 2

## 2021-01-31 NOTE — Progress Notes (Signed)
Pt's D/C instructions completed.  Verbalized understanding of all instructions including diet, activity, s/sx to alert MD, medications, wound care, and f/u appointment.  Family at BS.

## 2021-01-31 NOTE — Discharge Instructions (Addendum)
ID outpatient follow up appointment 02/28/2021 at 9:20   Include in discharge information           Gastroparesis: Care Instructions  Overview     When you have gastroparesis, your stomach takes a lot longer to empty. This delay can cause belly pain, bloating, and belching. It also can cause hiccups, heartburn, nausea or vomiting. You may not feel like eating. These symptoms may come and go. They most often occur during and after meals. You may feel full after only a few bites of food.  This condition occurs when the nerves or muscles to the stomach don't work properly. Diabetes is one of the most common causes of this nerve damage. Gastroparesis can make it harder to control your blood sugar levels. But keeping your blood sugar levels under control may help with your symptoms. Parkinson's disease, stroke, and some medicines can also cause this condition.  Home treatment can often help.  Follow-up care is a key part of your treatment and safety. Be sure to make and go to all appointments, and call your doctor if you are having problems. It's also a good idea to know your test results and keep a list of the medicines you take.  How can you care for yourself at home?  Eat several small meals each day rather than three large meals.  Eat foods that are low in fiber and fat.  If your doctor suggests it, take medicines that help the stomach empty more quickly. These are called motility agents.  When should you call for help?   Call your doctor now or seek immediate medical care if:    You are vomiting.     You have new or worse belly pain.     You have a fever.     You cannot pass stools or gas.   Watch closely for changes in your health, and be sure to contact your doctor if you have any problems.  Where can you learn more?  Go to [email or web address] and sign in to your MyChart account. Enter M106 in the [place] box to learn more about "Gastroparesis: Care Instructions."     If you  do not have an account, please click on the "Sign Up Now" link.  Current as of: October 15, 2020               Content Version: 13.4  ?? 2006-2022 [employer]wise, [employer].   Care instructions adapted under license by [facility]. If you have questions about a medical condition or this instruction, always ask your healthcare professional. [place] any warranty or liability for your use of this information.

## 2021-01-31 NOTE — Progress Notes (Signed)
END OF SHIFT NOTE:    INTAKE/OUTPUT  09/21 0701 - 09/22 0700  In: 1764.5 [P.O.:1040; I.V.:724.5]  Out: 700 [Urine:700]  Voiding: Yes  Catheter: No  Drain:              Flatus: Patient does have flatus present.    Stool:  occurrences.    Characteristics:  Stool Appearance: Loose, Formed  Stool Color: Brown  Stool Amount: Medium  Stool Assessment  Incontinence: No  Stool Appearance: Loose, Formed  Stool Color: Brown  Stool Amount: Medium  Stool Source: Rectum  Last BM (including prior to admit): 01/30/21    Emesis:  occurrences.    Characteristics:        VITAL SIGNS  Patient Vitals for the past 12 hrs:   Temp Pulse Resp BP SpO2   01/31/21 0240 -- (!) 48 -- -- --   01/30/21 2324 99 ??F (37.2 ??C) 51 18 116/83 96 %   01/30/21 1945 -- 57 -- -- --   01/30/21 1925 98.8 ??F (37.1 ??C) 57 18 108/70 96 %   01/30/21 1536 98.4 ??F (36.9 ??C) 53 18 102/68 96 %       Pain Assessment  Pain Level: 6 (01/30/21 2000)  Pain Location: Abdomen  Patient's Stated Pain Goal: 0 - No pain    Ambulating  Yes    Shift report given to oncoming nurse at the bedside.    Aleen Sells, RN

## 2021-01-31 NOTE — Progress Notes (Signed)
na

## 2021-01-31 NOTE — Care Coordination-Inpatient (Signed)
Pt with discharge orders this day.  Pt to return home with family.  Home health referral sent to [facility] to resume services at discharge.     No additional CM needs at discharge.        01/26/21 1543   Service Assessment   Patient Orientation Other (see comment)  (sleeping)   History Provided By Child/Family  (patient's mother:  [name]  [phone])   Support Systems Parent;Family Members   Patient's Healthcare Decision Maker is: Scientist, research (physical sciences) Next of Kin  (Parent:  [name])   PCP Verified by CM Yes  (Dr Retta Diones [phone])   Last Visit to PCP Within last 3 months   Prior Functional Level Independent in ADLs/IADLs   Can patient return to prior living arrangement Yes   Ability to make needs known: Good   Family able to assist with home care needs: Yes   Social/Functional History   Lives With Parent   Type of [place] Two level   Home Access Stairs to enter without rails   Entrance Stairs - Number of Steps 5   Receives Help From Family   ADL Assistance Independent   Ambulation Assistance Independent   Transfer Assistance Independent   Occupation Full time employment   Type of Occupation Type Groomer   Discharge Planning   Type of [place] Family Members;Parent   Current Services Prior To Admission None   Potential Assistance Needed N/A   DME Ordered? No   Potential Assistance Purchasing Medications No   Type of Home Care Services None   Patient expects to be discharged to: House   One/Two Story Residence Two story   # of Interior Steps 14   Services At/After Discharge   Transition of Care Consult (CM Consult) Discharge Planning;Home [place] Provided? No   Mode of Transport at Discharge Other (see comment)  (Parent: [name])   Confirm Follow Up Transport Family   Condition of Participation: Discharge Planning   The Plan for Transition of Care is related to the following treatment goals: return to  baseline   The Patient and/or Patient Representative was provided with a Choice of Provider? Patient;Patient Representative   Name of the Patient Representative who was provided with the Choice of Provider and agrees with the Discharge Plan?  parent: [name]   The Patient and/Or Patient Representative agree with the Discharge Plan? Yes   Freedom of Choice list was provided with basic dialogue that supports the patient's individualized plan of care/goals, treatment preferences, and shares the quality data associated with the providers?  Yes

## 2021-01-31 NOTE — Consults (Signed)
on [facility]: Inpatient Hematology / Oncology Consult Note    Reason for Consult:    Referring Physician:  No att. providers found    History of Present Illness:  Ms. Robin Mendoza is a 20 y.o. female admitted on 01/26/2021 with a primary diagnosis of chronic N/V, electrolyte abnormalities and gastroparesis.  PMHx; highly functional autism, depression, chronic gastroparesis - fu at [facility], [facility].  She came in with N/V and abd pain.  She was started on abx for suspected sepsis.  She has undergone extensive workup with GI.  Completed EGD/colonoscopy.  [facility] rec [place].  Currently on Zyprexa - recently at 5mg .  Her symptoms started in Jan with decreased intake due to nausea.  LFTs better.  No nutritional deficits.  We are asked for recs re anemia and thrombocytopenia.        Review of Systems:  Constitutional Denies fever or chills.  + weight loss or appetite changes. Denies fatigue. Denies night sweats.   HEENT Denies trauma, blurry vision, hearing loss, ear pain, nosebleeds, sore throat, neck pain and ear discharge.    Skin Denies lesions or rashes.   Lungs Denies dyspnea, cough, sputum production or hemoptysis.   Cardiovascular Denies chest pain, palpitations, or lower extremity edema.   Gastrointestinal + nausea or vomiting. Denies changes in bowel habits.  Denies bloody or black stools. + abdominal pain.   GU Denies dysuria, frequency or hesitancy of urination.   Neuro Denies headaches, visual changes or ataxia. Denies dizziness, tingling, tremors, sensory change, speech change, focal weakness      Hematology Denies easy bruising or bleeding, denies gingival bleeding or epistaxis.   Endo Denies heat/cold intolerance, denies diabetes or thyroid abnormalities.   MSK Denies back pain, arthralgias, myalgias or frequent falls.     Psychiatric/Behavioral Denies depression and substance abuse. The patient is not nervous/anxious.         Allergies   Allergen Reactions    Cow's Milk Anaphylaxis and  Hives    Food Anaphylaxis    Lac Bovis Anaphylaxis    Dog Epithelium Allergy Skin Test Itching    Other Itching     Past Medical History:   Diagnosis Date    ADHD     managed with meds    Autism     Depression     GERD (gastroesophageal reflux disease)     Weight loss      Past Surgical History:   Procedure Laterality Date    CHOLECYSTECTOMY, LAPAROSCOPIC N/A 10/05/2020    CHOLECYSTECTOMY LAPAROSCOPIC performed by Azalia Bilis, MD at [facility] MAIN OR    COLONOSCOPY N/A 09/17/2020    COLONOSCOPY/BMI 21 performed by Flint Melter, MD at [facility] ENDOSCOPY    COLONOSCOPY N/A 01/14/2019    COLONOSCOPY/  BMI 21 PT IS AUTISTIC performed by Flint Melter, MD at [facility] ENDOSCOPY    EAR SURGERY Bilateral     as a baby    LAPAROSCOPIC APPENDECTOMY N/A 10/05/2020    APPENDECTOMY LAPAROSCOPIC performed by Azalia Bilis, MD at [facility] MAIN OR    UPPER GASTROINTESTINAL ENDOSCOPY       Family History   Problem Relation Age of Onset    Cancer Mother         bilateral breast cancer    No Known Problems Father     Hypertension Mother      Social History     Socioeconomic History    Marital status: Single     Spouse  name: Not on file    Number of children: Not on file    Years of education: Not on file    Highest education level: Not on file   Occupational History    Not on file   Tobacco Use    Smoking status: Never    Smokeless tobacco: Never   Substance and Sexual Activity    Alcohol use: Not Currently    Drug use: Never    Sexual activity: Not on file   Other Topics Concern    Not on file   Social History Narrative    Not on file     Social Determinants of Health     Financial Resource Strain: Not on file   Food Insecurity: Not on file   Transportation Needs: Not on file   Physical Activity: Not on file   Stress: Not on file   Social Connections: Not on file   Intimate Partner Violence: Not on file   Housing Stability: Not on file     No current facility-administered medications for this encounter.     Current Outpatient  Medications   Medication Sig Dispense Refill    promethazine (PHENERGAN) 12.5 MG tablet Take 1 tablet by mouth 4 times daily as needed for Nausea 100 tablet 0    granisetron (SANCUSO) 3.1 MG/24HR PTCH Place 1 patch onto the skin every 7 days Do not take zofran when using sancuso 4 patch 1    Prucalopride Succinate 2 MG TABS Take 2 mg by mouth daily.      guanFACINE (INTUNIV) 1 MG TB24 extended release tablet Take 1 mg by mouth daily.      OLANZapine (ZYPREXA) 2.5 MG tablet Take 5 mg by mouth nightly      acetaminophen (TYLENOL) 500 MG tablet Take 500 mg by mouth every 6 hours as needed for Pain (breakthrough pain).      EPINEPHrine 0.3 MG/0.3ML SOSY Inject 0.3 mg into the muscle 1 (one) time if needed (allergic reaction).      ondansetron (ZOFRAN-ODT) 8 MG TBDP disintegrating tablet Place 1 tablet under the tongue every 8 hours as needed for Nausea or Vomiting 10 tablet 0    Sertraline HCl (ZOLOFT PO) Take 75 mg by mouth daily (Patient not taking: Reported on 02/01/2021)      Famotidine (PEPCID PO) Take 10 mg by mouth daily      levonorgestrel (MIRENA, 52 MG,) IUD 52 mg 1 each by IntraUTERine route once         OBJECTIVE:  No data found.  No data recorded.    No intake/output data recorded.    Physical Exam:  Constitutional:   Well developed, well nourished female in no acute distress, sitting comfortably in the hospital chair.  Dr Wynetta Emery, mom, is at her side.      HEENT: Normocephalic and atraumatic. Oropharynx is clear, mucous membranes are moist.  Pupils are equal, round, and reactive to light. Extraocular muscles are intact.  Sclerae anicteric.    Lymph node   No palpable submandibular, cervical LAD    Skin Warm and dry.  No bruising and no rash noted.  No erythema.  No pallor.    Respiratory Lungs are clear to auscultation bilaterally without wheezes, rales or rhonchi, normal air exchange without accessory muscle use.    CVS Normal rate, regular rhythm   Abdomen Soft, nondistended   Neuro Grossly nonfocal with  no obvious sensory or motor deficits.   MSK Normal range of motion in  general.  No edema and no tenderness.   Psych Appropriate mood and affect.      Labs:    No results found for this or any previous visit (from the past 24 hour(s)).    Imaging:  Reviewed     ASSESSMENT:    Principal Problem:    Septic shock ([place])  Active Problems:    Hypokalemia    Hypophosphatemia    Hypomagnesemia    Gastroparesis    Elevated liver enzymes    Autism    Depression    Weight loss  Resolved Problems:    * No resolved hospital problems. *      PLAN / RECOMMENDATIONS:  Lab studies and imaging studies were personally reviewed.  Pertinent old records were reviewed     Anemia/thrombocytopenia - improving off abx; s/p recent infection   - During today's visit we discussed the pathophysiology of hematopoesis in general and etiologies.    - b12 adequate, zinc nl, TSAT 39%, vit C adequate   - vit D defic - rec repletion   - Cu ++ pending   - no s/s of hemolysis, smear reviewed     2. Nausea   - can do a trial of Sancuso patch   - to complete workup, consider imaging of brain; MRI would need to be done under sedation due to pt's inability to tolerate reg MRI; RN aware     Pt can fu with me at [facility] upon d/c.      Case discussed with Dr. Wynetta Emery, pt's mother       Thank you for allowing me to participate in the care of Robin Mendoza.        Lurene Shadow, MD  [facility] [place] [place] [address]  Office : [phone]  Fax : [phone]

## 2021-01-31 NOTE — Discharge Summary (Signed)
@[email or web address] @    Physician Discharge Summary     Patient ID:  Robin Mendoza  [id number]  20 y.o.  [birth year]    Admit date: 01/26/2021    Discharge date: 01-31-2021    Diagnosis:  Sepsis on presentation, severe, blood cultures negative, initially ttreated with Zosyn+ Vancomycin+ Doxycycline, improved. Required transitory stay in ICU. Seen by infectious disease and Gastroenterology.  Primary exposures are extensive travel -- most recently [place] and [place]; and rabbit exposure. The rabbit looked like a wild rabbit and was known to bite but did not bite her, and then died suddenly in 07-03-20.   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 female with medical history of  highly functional autism, depression, chronic gastroparesis followed by [facility], who presented with nausea, emesis and abdominal pain.  Mother Dr. Lynann Mendoza is at bedside and relates history.   She has had a local GI workup s/op EGD/ colonoscopy. Admitted to [facility] for electrolyte issues. Seen at [facility] and recommending motegrity with the diagnosis of idiopathic gastroparesis.   She has not started the motegrity yet but has this on hand.  Continues to have nausea/ emesis abdominal pain. Poor oral intake. No longer able to function well/ stopped working.   Has been getting IVF at home.   Meets sepsis criteria due to lactic acidosis, fever, tachycardia, hypotension.   S/p vancomycin/ zosyn.   Blood cultures ordered.   CXR negative.   UA negative.   COVID and flu negative.   She has electrolytes that are low and being supplemented.   Patient admitted and treated as above.  On day of discharge, patient exam showed mild LUQ tenderness, tolerated oral intake well,  afebrile and otherwise asymptomatic.    PCP: Joyce Gross, MD    Patient Instructions:   Current Discharge Medication List        CONTINUE these medications which have CHANGED    Details   promethazine (PHENERGAN) 12.5 MG tablet Take 1 tablet by mouth 4 times daily as needed for Nausea  Qty: 100 tablet, Refills: 0           CONTINUE these medications which have NOT CHANGED    Details   guanFACINE (INTUNIV) 1 MG TB24 extended release tablet Take 1 mg by mouth daily.      OLANZapine (ZYPREXA) 2.5 MG tablet Take 5 mg by mouth nightly      acetaminophen (TYLENOL) 500 MG tablet Take 500 mg by mouth every 6 hours as needed for Pain (breakthrough pain).      EPINEPHrine 0.3 MG/0.3ML SOSY Inject 0.3 mg into the muscle 1 (one) time if needed (allergic reaction).      ondansetron (ZOFRAN-ODT) 8 MG TBDP disintegrating tablet Place 1 tablet under the tongue every 8 hours as needed for Nausea or Vomiting  Qty: 10 tablet, Refills: 0      Sertraline HCl (ZOLOFT PO) Take 75 mg by mouth daily      Famotidine (PEPCID PO) Take 10 mg by mouth daily      levonorgestrel (MIRENA, 52 MG,) IUD 52 mg 1 each by IntraUTERine route once           STOP taking these medications  erythromycin base (E-MYCIN) 250 MG tablet Comments:   Reason for Stopping:         scopolamine (TRANSDERM-SCOP) transdermal patch Comments:   Reason for Stopping:         azithromycin (ZITHROMAX) 200 MG/5ML suspension Comments:   Reason for Stopping:         scopolamine (TRANSDERM-SCOP) patch Comments:   Reason for Stopping:         erythromycin base (E-MYCIN) 250 MG tablet Comments:   Reason for Stopping:         polyethylene glycol (MIRALAX) 17 g packet Comments:   Reason for Stopping:         vitamin B-12 (CYANOCOBALAMIN) 1000 MCG tablet Comments:   Reason for Stopping:         Ketorolac Tromethamine (TORADOL ORAL PO) Comments:   Reason for Stopping:         pregabalin (LYRICA) 25 MG capsule Comments:   Reason for Stopping:         prochlorperazine  (COMPAZINE) 10 MG tablet Comments:   Reason for Stopping:         Sennosides (SENNA LAX PO) Comments:   Reason for Stopping:         LORazepam (ATIVAN) 1 MG tablet Comments:   Reason for Stopping:         vitamin D (ERGOCALCIFEROL) 1.25 MG (50000 UT) CAPS capsule Comments:   Reason for Stopping:         ZENPEP 20000-63000 units CPEP delayed release capsule Comments:   Reason for Stopping:         OMALIZUMAB 150 MG injection Comments:   Reason for Stopping:         VYVANSE 60 MG CHEW Comments:   Reason for Stopping:               Instructions:     PCP in 1 week, check CBC with differential/ BMP/ Mg in 10 days, diet and activity as tolerated.    Time 35 min  Please send copy to primary physician.    Signed:  Fonnie Jarvis, MD  01/31/2021  11:28 AM

## 2021-01-31 NOTE — Progress Notes (Signed)
Infectious Disease Consult    Today's Date: 01/31/2021   Admit Date: 01/26/2021    Impression:   SIRS / Sepsis - unclear etiology; this has resolved  Small bowel intussusception  Transaminitis; trending down  Abdominal pain; chronic RUQ  Chronic gastroparesis  Autism / ADHD / Depression  H/o cholecystectomy, appendectomy, 09/2020    Plan:     Her primary exposures are extensive travel -- most recently [place] and [place]; and rabbit exposure. The rabbit looked like a wild rabbit and was known to bite but did not bite her, and then died suddenly in 07-06-2020.   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  I will sign off and arrange outpatient followup    Anti-infectives:       Subjective:   Afebrile.  Sitting up in a chair. Only complaint is chronic RUQ discomfort        Allergies   Allergen Reactions    Cow's Milk Anaphylaxis and Hives    Food Anaphylaxis    Lac Bovis Anaphylaxis    Dog Epithelium Allergy Skin Test Itching    Other Itching        Review of Systems:  A comprehensive review of systems is negative except as stated in the HPI.      Objective:     Visit Vitals  BP 117/78   Pulse 57   Temp 98.3 ??F (36.8 ??C) (Oral)   Resp 19   Ht 5\' 5"  (1.651 m)   Wt 118 lb 9.6 oz (53.8 kg)   SpO2 98%   BMI 19.74 kg/m??     Temp (24hrs), Avg:98.7 ??F (37.1 ??C), Min:98.3 ??F (36.8 ??C), Max:99 ??F (37.2 ??C)     Physical exam unchanged from yesterday    Lines:  Peripheral IV:       Physical Exam:    General:  Alert, sitting up in chair in NAD   Eyes:  Sclera anicteric. Peri-orbital edema   Mouth/Throat: Mucous membranes normal   Neck: Supple   Lungs:   Clear to auscultation bilaterally, good effort   CV:  Regular rate and rhythm,no murmur, click, rub or gallop   Abdomen:    Soft, tender in the right upper quadrant   Extremities: No cyanosis; trace dependent edema   Skin: Skin color, texture, turgor normal. no acute rash or lesions; no phlebitis   Neuro: Responsive, moves all extremities   Musculoskeletal: No swelling or deformity   Lines/Devices:  Intact, no erythema, drainage or tenderness   Psych: Alert and oriented, normal mood affect given the setting       Data Review:     CBC:  Recent Labs     01/29/21  0257 01/30/21  0445 01/31/21  0545   WBC 5.6 5.3 8.0   HGB 9.6* 8.8* 10.1*   HCT 30.3* 26.7* 30.4*   PLT 101* 87* 97*         BMP:  Recent Labs     01/29/21  0257 01/30/21  0445 01/31/21  0545   BUN 13 10 6    NA 143 145 144   K 3.7 3.3* 3.3*   CL 114* 118* 113*   CO2 24 23 30          LFTS:  Recent  Labs     01/29/21  1746 01/30/21  0445 01/31/21  0545   ALT 123* 119* 113*         Microbiology:   Reviewed    Imaging:   01/27/21 [place] Abd:   Impression   Unremarkable right upper quadrant ultrasound     01/26/21 CT abd:   Impression   1.  Trace to small pleural effusions in both lung bases.   2.  Left upper abdominal small bowel intussusception, most often a transient and   self-limited process.   3.  Cholecystectomy.       Signed By: Katheran James, MD     January 31, 2021

## 2021-01-31 NOTE — Other (Signed)
[facility] Hematology & Oncology        Inpatient Hematology / Oncology Plan of Care    Reason for Consult:  Hypokalemia [E87.6]  Hypomagnesemia [E83.42]  Intussusception of small bowel ([place]) [K56.1]  Septic shock ([place]) [A41.9, R65.21]  Sepsis ([place]) [A41.9]  Non-intractable vomiting with nausea, unspecified vomiting type [R11.2]  Referring Physician:  No att. providers found    History of Present Illness:  Robin Mendoza is a 20 y.o. female admitted on 01/26/2021 with a primary diagnosis of The primary encounter diagnosis was Septic shock ([place]). Diagnoses of Intussusception of small bowel ([place]), Hypokalemia, Hypomagnesemia, Non-intractable vomiting with nausea, unspecified vomiting type, and Intractable nausea and vomiting were also pertinent to this visit..      Robin Mendoza is a 20 yo female with medical history of highly functional autism, depression, chronic gastroparesis followed by [facility] and also seen at [facility] by GI that presented with nausea, emesis and RUQ ab pain. She appeared septic on presentation and placed on abx.  She has had extensive GI workup EGD and colonoscopy. When last seen at [facility] recommended [place] but has not had this on hand. She is on Zyprexa recently increased to 5 mg. She has poor PO intake since around January when symptoms started. She states that her nausea is always there whether eating more or not eating at all. Labs showed elevated LFTs which are improving. No nutritional deficits noted. CPR is much improved. She has been on doxycycline, zosyn and vanc. She has been off antibiotics for 2 days. Bcs are NTD. Send out labs from ID are pending. We are asked to see patient for low hemoglobin and platelets.         Allergies   Allergen Reactions    Cow's Milk Anaphylaxis and Hives    Food Anaphylaxis    Lac Bovis Anaphylaxis    Dog Epithelium Allergy Skin Test Itching    Other Itching     Past Medical History:   Diagnosis Date    ADHD     managed with meds    Autism     Depression      GERD (gastroesophageal reflux disease)     Weight loss      Past Surgical History:   Procedure Laterality Date    CHOLECYSTECTOMY, LAPAROSCOPIC N/A 10/05/2020    CHOLECYSTECTOMY LAPAROSCOPIC performed by Azalia Bilis, MD at [facility] MAIN OR    COLONOSCOPY N/A 09/17/2020    COLONOSCOPY/BMI 21 performed by Flint Melter, MD at [facility] ENDOSCOPY    COLONOSCOPY N/A 01/14/2019    COLONOSCOPY/  BMI 21 PT IS AUTISTIC performed by Flint Melter, MD at [facility] ENDOSCOPY    EAR SURGERY Bilateral     as a baby    LAPAROSCOPIC APPENDECTOMY N/A 10/05/2020    APPENDECTOMY LAPAROSCOPIC performed by Azalia Bilis, MD at [facility] MAIN OR    UPPER GASTROINTESTINAL ENDOSCOPY       Family History   Problem Relation Age of Onset    Cancer Mother         bilateral breast cancer    No Known Problems Father     Hypertension Mother      Social History     Socioeconomic History    Marital status: Single     Spouse name: Not on file    Number of children: Not on file    Years of education: Not on file    Highest education level: Not on file   Occupational  History    Not on file   Tobacco Use    Smoking status: Never    Smokeless tobacco: Never   Substance and Sexual Activity    Alcohol use: Not Currently    Drug use: Never    Sexual activity: Not on file   Other Topics Concern    Not on file   Social History Narrative    Not on file     Social Determinants of Health     Financial Resource Strain: Not on file   Food Insecurity: Not on file   Transportation Needs: Not on file   Physical Activity: Not on file   Stress: Not on file   Social Connections: Not on file   Intimate Partner Violence: Not on file   Housing Stability: Not on file     Current Facility-Administered Medications   Medication Dose Route Frequency Provider Last Rate Last Admin    promethazine (PHENERGAN) tablet 12.5 mg  12.5 mg Oral Q6H PRN Joya San, MD   12.5 mg at 01/31/21 1059    ondansetron (ZOFRAN-ODT) disintegrating tablet 4 mg  4 mg Oral Q4H PRN Fonnie Jarvis, MD    4 mg at 01/30/21 2004    Or    ondansetron (ZOFRAN) injection 4 mg  4 mg IntraVENous Q4H PRN Fonnie Jarvis, MD   4 mg at 01/31/21 1028    promethazine (PHENERGAN) tablet 25 mg  25 mg Oral Q6H PRN Fonnie Jarvis, MD        magnesium oxide (MAG-OX) tablet 400 mg  400 mg Oral Daily Fonnie Jarvis, MD   400 mg at 01/31/21 3762    potassium chloride (KLOR-CON M) extended release tablet 40 mEq  40 mEq Oral Daily with breakfast Fonnie Jarvis, MD   40 mEq at 01/31/21 8315    morphine injection 1 mg  1 mg IntraVENous Q4H PRN Fonnie Jarvis, MD        HYDROcodone-acetaminophen (NORCO) 5-325 MG per tablet 1 tablet  1 tablet Oral Q6H PRN Fonnie Jarvis, MD        Prucalopride Succinate TABS 2 mg (Patient Supplied)  2 mg Oral Daily Burna Forts, MD   2 mg at 01/31/21 0844    phenol 1.4 % mouth spray 1 spray  1 spray Mouth/Throat Q2H PRN Burna Forts, MD        Guanfacine ER 4 mg (PATIENT SUPPLIED)  (Patient Supplied)  4 mg Oral Daily Burna Forts, MD   4 mg at 01/31/21 0844    pantoprazole (PROTONIX) tablet 40 mg  40 mg Oral BID AC Burna Forts, MD   40 mg at 01/31/21 0544    lidocaine 1 % injection 5 mL  5 mL IntraDERmal Once Earley Abide, MD        sodium chloride flush 0.9 % injection 5-40 mL  5-40 mL IntraVENous 2 times per day Earley Abide, MD   10 mL at 01/31/21 0843    sodium chloride flush 0.9 % injection 5-40 mL  5-40 mL IntraVENous PRN Earley Abide, MD   10 mL at 01/28/21 0818    0.9 % sodium chloride infusion  25 mL IntraVENous PRN Earley Abide, MD        sertraline (ZOLOFT) tablet 50 mg  50 mg Oral Daily Burna Forts, MD   50 mg at 01/31/21 0842    polyethylene glycol (GLYCOLAX) packet 17 g  17 g Oral Daily PRN Burna Forts, MD   17 g at 01/28/21 1117  diphenhydrAMINE (BENADRYL) injection 25 mg  25 mg IntraVENous Q6H PRN Burna Forts, MD   25 mg at 01/28/21 1014    LORazepam (ATIVAN) injection 0.25 mg  0.25 mg IntraVENous Q6H PRN Burna Forts, MD   0.25 mg at  01/28/21 1430    medicated lip ointment (BLISTEX)   Topical PRN Fonnie Jarvis, MD        potassium chloride (KLOR-CON M) extended release tablet 40 mEq  40 mEq Oral PRN Fonnie Jarvis, MD        Or    potassium bicarb-citric acid (EFFER-K) effervescent tablet 40 mEq  40 mEq Oral PRN Fonnie Jarvis, MD        Or    potassium chloride 10 mEq/100 mL IVPB (Peripheral Line)  10 mEq IntraVENous PRN Fonnie Jarvis, MD        magnesium sulfate 2000 mg in 50 mL IVPB premix  2,000 mg IntraVENous PRN Fonnie Jarvis, MD        sodium phosphate 10 mmol in sodium chloride 0.9 % 250 mL IVPB  10 mmol IntraVENous PRN Fonnie Jarvis, MD        Or    sodium phosphate 15 mmol in sodium chloride 0.9 % 250 mL IVPB  15 mmol IntraVENous PRN Fonnie Jarvis, MD        Or    sodium phosphate 20 mmol in sodium chloride 0.9 % 500 mL IVPB  20 mmol IntraVENous PRN Fonnie Jarvis, MD        OLANZapine zydis (ZYPREXA) disintegrating tablet 5 mg  5 mg Oral Nightly Earley Abide, MD   5 mg at 01/30/21 2008    0.9 % sodium chloride infusion   IntraVENous Continuous Burna Forts, MD 50 mL/hr at 01/28/21 1239 Rate Change at 01/28/21 1239    0.9 % sodium chloride bolus  100 mL IntraVENous ONCE PRN Coben D Thorn, MD        sodium chloride flush 0.9 % injection 10 mL  10 mL IntraVENous ONCE PRN Coben D Thorn, MD        sodium chloride flush 0.9 % injection 5-40 mL  5-40 mL IntraVENous 2 times per day Jacqlyn Krauss, MD   10 mL at 01/31/21 0843    sodium chloride flush 0.9 % injection 5-40 mL  5-40 mL IntraVENous PRN Jacqlyn Krauss, MD   10 mL at 01/28/21 0819    0.9 % sodium chloride infusion   IntraVENous PRN Jacqlyn Krauss, MD        acetaminophen (TYLENOL) tablet 650 mg  650 mg Oral Q6H PRN Jacqlyn Krauss, MD   650 mg at 01/31/21 6301    Or    acetaminophen (TYLENOL) suppository 650 mg  650 mg Rectal Q6H PRN Jacqlyn Krauss, MD         Current Outpatient Medications   Medication Sig Dispense Refill    promethazine (PHENERGAN) 12.5  MG tablet Take 1 tablet by mouth 4 times daily as needed for Nausea 100 tablet 0    granisetron (SANCUSO) 3.1 MG/24HR PTCH Place 1 patch onto the skin every 7 days Do not take zofran when using sancuso 4 patch 1    guanFACINE (INTUNIV) 1 MG TB24 extended release tablet Take 1 mg by mouth daily.      OLANZapine (ZYPREXA) 2.5 MG tablet Take 5 mg by mouth nightly      acetaminophen (TYLENOL) 500 MG tablet Take 500 mg by mouth every 6 hours as needed for Pain (breakthrough pain).  EPINEPHrine 0.3 MG/0.3ML SOSY Inject 0.3 mg into the muscle 1 (one) time if needed (allergic reaction).      ondansetron (ZOFRAN-ODT) 8 MG TBDP disintegrating tablet Place 1 tablet under the tongue every 8 hours as needed for Nausea or Vomiting 10 tablet 0    Sertraline HCl (ZOLOFT PO) Take 75 mg by mouth daily      Famotidine (PEPCID PO) Take 10 mg by mouth daily      levonorgestrel (MIRENA, 52 MG,) IUD 52 mg 1 each by IntraUTERine route once         OBJECTIVE:  Patient Vitals for the past 8 hrs:   BP Temp Temp src Pulse Resp SpO2   01/31/21 1123 114/75 98.4 ??F (36.9 ??C) Oral 52 19 98 %   01/31/21 0735 117/78 98.3 ??F (36.8 ??C) Oral 57 19 98 %     Temp (24hrs), Avg:98.6 ??F (37 ??C), Min:98.3 ??F (36.8 ??C), Max:99 ??F (37.2 ??C)    09/22 0701 - 09/22 1900  In: 120 [P.O.:120]  Out: -     Physical Exam: by MD    Labs:    Recent Results (from the past 24 hour(s))   Transferrin Saturation    Collection Time: 01/31/21  5:45 AM   Result Value Ref Range    Iron 83 35 - 150 ug/dL    TIBC 213 (L) 250 - 450 ug/dL    TRANSFERRIN SATURATION 39 >20 %   Ferritin    Collection Time: 01/31/21  5:45 AM   Result Value Ref Range    Ferritin 90 8 - 388 NG/ML   Folate    Collection Time: 01/31/21  5:45 AM   Result Value Ref Range    Folate 13.4 3.1 - 17.5 ng/mL   Vitamin B12    Collection Time: 01/31/21  5:45 AM   Result Value Ref Range    Vitamin B-12 1099 (H) 193 - 986 pg/mL   Vitamin D 25 Hydroxy    Collection Time: 01/31/21  5:45 AM   Result Value Ref Range     Vit D, 25-Hydroxy 22.7 (L) 30.0 - 100.0 ng/mL   Haptoglobin    Collection Time: 01/31/21  5:45 AM   Result Value Ref Range    Haptoglobin 84 30 - 200 mg/dL   Lactate Dehydrogenase    Collection Time: 01/31/21  5:45 AM   Result Value Ref Range    LD 249 (H) 100 - 190 U/L   C-Reactive Protein    Collection Time: 01/31/21  5:45 AM   Result Value Ref Range    CRP 1.2 (H) 0.0 - 0.9 mg/dL   CBC with Auto Differential    Collection Time: 01/31/21  5:45 AM   Result Value Ref Range    WBC 8.0 4.3 - 11.1 K/uL    RBC 3.41 (L) 4.05 - 5.2 M/uL    Hemoglobin 10.1 (L) 11.7 - 15.4 g/dL    Hematocrit 30.4 (L) 35.8 - 46.3 %    MCV 89.1 79.6 - 97.8 FL    MCH 29.6 26.1 - 32.9 PG    MCHC 33.2 31.4 - 35.0 g/dL    RDW 13.0 11.9 - 14.6 %    Platelets 97 (L) 150 - 450 K/uL    MPV 11.6 9.4 - 12.3 FL    nRBC 0.00 0.0 - 0.2 K/uL    Differential Type AUTOMATED      Seg Neutrophils 38 (L) 43 - 78 %    Lymphocytes 47 (H) 13 -  44 %    Monocytes 10 4.0 - 12.0 %    Eosinophils % 4 0.5 - 7.8 %    Basophils 1 0.0 - 2.0 %    Immature Granulocytes 1 0.0 - 5.0 %    Segs Absolute 3.1 1.7 - 8.2 K/UL    Absolute Lymph # 3.8 0.5 - 4.6 K/UL    Absolute Mono # 0.8 0.1 - 1.3 K/UL    Absolute Eos # 0.3 0.0 - 0.8 K/UL    Basophils Absolute 0.0 0.0 - 0.2 K/UL    Absolute Immature Granulocyte 0.0 0.0 - 0.5 K/UL   Comprehensive Metabolic Panel    Collection Time: 01/31/21  5:45 AM   Result Value Ref Range    Sodium 144 136 - 145 mmol/L    Potassium 3.3 (L) 3.5 - 5.1 mmol/L    Chloride 113 (H) 101 - 110 mmol/L    CO2 30 21 - 32 mmol/L    Anion Gap 1 (L) 4 - 13 mmol/L    Glucose 79 65 - 100 mg/dL    BUN 6 6 - 23 MG/DL    Creatinine 0.50 (L) 0.6 - 1.0 MG/DL    GFR African American >60 >60 ml/min/1.45m    GFR Non-African American >60 >60 ml/min/1.711m   Calcium 8.2 (L) 8.3 - 10.4 MG/DL    Total Bilirubin 0.6 0.2 - 1.1 MG/DL    ALT 113 (H) 12 - 65 U/L    AST 75 (H) 15 - 37 U/L    Alk Phosphatase 264 (H) 50 - 136 U/L    Total Protein 4.5 (L) 6.3 - 8.2 g/dL    Albumin  2.1 (L) 3.5 - 5.0 g/dL    Globulin 2.4 2.3 - 3.5 g/dL    Albumin/Globulin Ratio 0.9 (L) 1.2 - 3.5         RECOMMENDATIONS:  Low hgb and platelets likely due to infection and abx.     Nausea-will trial Sancuso patch-patient agreeable to brain MRI with sedation    Lab studies and imaging studies were personally reviewed.  Pertinent old records were reviewed    Thank you for allowing usKoreao participate in the care of Ms. JoSindtFormal consult note by Dr. GrLeitha Schullero follow. She will need to follow up with Dr. GrBinnie Rail-3 weeks.          DiLannie FieldsAPRN - NP   [facility]  [address],[place] [address]Office : [phone] : [phone]

## 2021-01-31 NOTE — Plan of Care (Signed)
Problem: Discharge Planning  Goal: Discharge to home or other facility with appropriate resources  Outcome: Progressing     Problem: Pain  Goal: Verbalizes/displays adequate comfort level or baseline comfort level  Outcome: Progressing     Problem: Safety - Adult  Goal: Free from fall injury  Outcome: Progressing     Problem: Confusion  Goal: Confusion, delirium, dementia, or psychosis is improved or at baseline  Description: INTERVENTIONS:  1. Assess for possible contributors to thought disturbance, including medications, impaired vision or hearing, underlying metabolic abnormalities, dehydration, psychiatric diagnoses, and notify attending LIP  2. Institute high risk fall precautions, as indicated  3. Provide frequent short contacts to provide reality reorientation, refocusing and direction  4. Decrease environmental stimuli, including noise as appropriate  5. Monitor and intervene to maintain adequate nutrition, hydration, elimination, sleep and activity  6. If unable to ensure safety without constant attention obtain sitter and review sitter guidelines with assigned personnel  7. Initiate Psychosocial CNS and Spiritual Care consult, as indicated  Outcome: Progressing

## 2021-01-31 NOTE — Progress Notes (Signed)
[facility] Progress Note         Admit Date:  01/26/2021    Today's Date:  01/31/2021    CC:  Chronic abdominal pain, nausea with vomiting, constipation    Subjective:     Patient feels great and wants to go home.  She still has mild right sided abdominal pain.  She ate breakfast well.  She is having regular BM now.      Mother at bedside who requests that I see her in follow up.    Medications:   Current Facility-Administered Medications   Medication Dose Route Frequency    promethazine (PHENERGAN) tablet 12.5 mg  12.5 mg Oral Q6H PRN    ondansetron (ZOFRAN-ODT) disintegrating tablet 4 mg  4 mg Oral Q4H PRN    Or    ondansetron (ZOFRAN) injection 4 mg  4 mg IntraVENous Q4H PRN    promethazine (PHENERGAN) tablet 25 mg  25 mg Oral Q6H PRN    magnesium oxide (MAG-OX) tablet 400 mg  400 mg Oral Daily    potassium chloride (KLOR-CON M) extended release tablet 40 mEq  40 mEq Oral Daily with breakfast    morphine injection 1 mg  1 mg IntraVENous Q4H PRN    HYDROcodone-acetaminophen (NORCO) 5-325 MG per tablet 1 tablet  1 tablet Oral Q6H PRN    Prucalopride Succinate TABS 2 mg (Patient Supplied)  2 mg Oral Daily    phenol 1.4 % mouth spray 1 spray  1 spray Mouth/Throat Q2H PRN    Guanfacine ER 4 mg (PATIENT SUPPLIED)  (Patient Supplied)  4 mg Oral Daily    pantoprazole (PROTONIX) tablet 40 mg  40 mg Oral BID AC    lidocaine 1 % injection 5 mL  5 mL IntraDERmal Once    sodium chloride flush 0.9 % injection 5-40 mL  5-40 mL IntraVENous 2 times per day    sodium chloride flush 0.9 % injection 5-40 mL  5-40 mL IntraVENous PRN    0.9 % sodium chloride infusion  25 mL IntraVENous PRN    sertraline (ZOLOFT) tablet 50 mg  50 mg Oral Daily    polyethylene glycol (GLYCOLAX) packet 17 g  17 g Oral Daily PRN    diphenhydrAMINE (BENADRYL) injection 25 mg  25 mg IntraVENous Q6H PRN    LORazepam (ATIVAN) injection 0.25 mg  0.25 mg IntraVENous Q6H PRN    medicated lip ointment (BLISTEX)   Topical PRN    potassium chloride  (KLOR-CON M) extended release tablet 40 mEq  40 mEq Oral PRN    Or    potassium bicarb-citric acid (EFFER-K) effervescent tablet 40 mEq  40 mEq Oral PRN    Or    potassium chloride 10 mEq/100 mL IVPB (Peripheral Line)  10 mEq IntraVENous PRN    magnesium sulfate 2000 mg in 50 mL IVPB premix  2,000 mg IntraVENous PRN    sodium phosphate 10 mmol in sodium chloride 0.9 % 250 mL IVPB  10 mmol IntraVENous PRN    Or    sodium phosphate 15 mmol in sodium chloride 0.9 % 250 mL IVPB  15 mmol IntraVENous PRN    Or    sodium phosphate 20 mmol in sodium chloride 0.9 % 500 mL IVPB  20 mmol IntraVENous PRN    OLANZapine zydis (ZYPREXA) disintegrating tablet 5 mg  5 mg Oral Nightly    0.9 % sodium chloride infusion   IntraVENous Continuous    0.9 % sodium chloride bolus  100 mL  IntraVENous ONCE PRN    sodium chloride flush 0.9 % injection 10 mL  10 mL IntraVENous ONCE PRN    sodium chloride flush 0.9 % injection 5-40 mL  5-40 mL IntraVENous 2 times per day    sodium chloride flush 0.9 % injection 5-40 mL  5-40 mL IntraVENous PRN    0.9 % sodium chloride infusion   IntraVENous PRN    acetaminophen (TYLENOL) tablet 650 mg  650 mg Oral Q6H PRN    Or    acetaminophen (TYLENOL) suppository 650 mg  650 mg Rectal Q6H PRN       Review of Systems:  ROS was obtained, with pertinent positives as listed above.  No chest pain or SOB.    Diet:  Regular; Vegan     Objective:   Vitals:  BP 117/78    Pulse 57    Temp 98.3 ??F (36.8 ??C) (Oral)    Resp 19    Ht 5\' 5"  (1.651 m)    Wt 118 lb 9.6 oz (53.8 kg)    SpO2 98%    BMI 19.74 kg/m??   Intake/Output:  No intake/output data recorded.  09/20 1901 - 09/22 0700  In: 2781.5 [P.O.:1040; I.V.:1741.5]  Out: 1300 [Urine:1300]  Exam:  General appearance: alert, cooperative, no distress.  Sitting up in bed.    Lungs: clear to auscultation bilaterally anteriorly  Heart: regular rate and rhythm  Abdomen: soft, non-tender. Bowel sounds normal.  Extremities: extremities normal, atraumatic, no cyanosis or  edema  Neuro:  alert and oriented    Data Review (Labs):    Recent Labs     01/29/21  0257 01/29/21  1746 01/30/21  0445 01/31/21  0545   WBC 5.6  --  5.3 8.0   HGB 9.6*  --  8.8* 10.1*   HCT 30.3*  --  26.7* 30.4*   PLT 101*  --  87* 97*   MCV 90.7  --  89.0 89.1   NA 143  --  145 144   K 3.7  --  3.3* 3.3*   CL 114*  --  118* 113*   CO2 24  --  23 30   BUN 13  --  10 6   MG  --   --  1.8  --    AST  --  135* 119* 75*   ALT  --  123* 119* 113*   INR  --  1.3  --   --        Assessment:     Principal Problem:    Septic shock (HCC)  Active Problems:    Hypokalemia    Hypophosphatemia    Hypomagnesemia    Gastroparesis    Elevated liver enzymes    Autism    Depression    Weight loss  Resolved Problems:    * No resolved hospital problems. *    Miss Robin Mendoza is a 20 y.o. female with PMH as listed below, who was seen in consultation on 01/26/21 at the request of Dr. Rosana Hoes for abdominal pain with N/V.  She has a history of months of RUQ pain that has defied evaluation.  There has been associated N/V with decrease eating, leading to weight loss with electrolyte dyscrasias. She has been evaluated by [facility]. Kend[facility] with [facility], Dr. Dot Been with [facility], and Dr. Terrace Arabia with [facility].  The most recent evaluation was at [facility], where there is a concern over gastroparesis (patient has not  had a GES) and visceral hypersensitivity. Patient is being managed for sepsis.  ID following.     RUQ US unremarkable on 01/27/21.      She did a MiraLax clean out on 01/29/21.    She is feeling well today and wants to go home.    Plan:     - Doing better today and wants to go home  - Appreciate recommendations from dietician  - I advised her on eating small, frequent meals 5-6 times daily  - Okay to use PRN anti-emetics.  Added Phenergan per her request on 01/30/21.  - Continue Zyprexa 5 mg PO qhs  - Continue Protonix 40 mg PO daily  - Continue Motegrity 2 mg PO daily  - Will arrange outpatient follow up in 4-6 weeks.  -  Will sign off. Please call us with further questions or concerns.    Robin San, MD   [facility]

## 2021-02-01 ENCOUNTER — Encounter: Admit: 2021-02-01 | Discharge: 2021-02-01 | Payer: BLUE CROSS/BLUE SHIELD

## 2021-02-01 LAB — MISCELLANEOUS SENDOUT

## 2021-02-01 LAB — ZINC, WHOLE BLOOD: Zinc: 469 ug/dL (ref 440–860)

## 2021-02-01 NOTE — Care Coordination-Inpatient (Signed)
[place] Transitions Initial Follow Up Call    Call within 2 business days of discharge: Yes    Patient: Robin Mendoza Patient DOB: 2001-02-19   MRN: [id number]  Reason for Admission: Sepsis  Discharge Date: 01/31/21 RARS: Readmission Risk Score: 12.1      Last Discharge [facility]       Date Complaint Diagnosis Description Type Department Provider    01/26/21 Abdominal Pain Septic shock ([place]) ... ED to Hosp-Admission (Discharged) (ADMITTED) [id number] Fonnie Jarvis, MD; Freddi Che,...          Transitions of Care Initial Call    Was this an external facility discharge? No Discharge Facility: [facility]    Challenges to be reviewed by the provider   Additional needs identified to be addressed with provider: No               Method of communication with provider : none    Advance Care Planning:   Does patient have an Advance Directive: not on file.     Ambulatory Care Manager contacted the patient by telephone to perform post hospital discharge assessment. Verified name and DOB with patient as identifiers. Patient is current with CM from recent discharge at [facility] following admission for Adult FTT and Malnutrition.     ACM reviewed discharge instructions, medical action plan and red flags with patient who verbalized understanding. Patient given an opportunity to ask questions and does not have any further questions or concerns at this time. Were discharge instructions available to patient? Yes. Reviewed appropriate site of care based on symptoms and resources available to patient including: PCP  Specialist  Home health  When to call [place]. The patient agrees to contact the PCP/Specialist office for questions related to their healthcare.     Medication reconciliation was performed with patient, who verbalizes understanding of administration of home medications. Advised obtaining a 90-day supply of all daily and as-needed medications.     Was patient discharged with a pulse oximeter?  no    ACM provided contact information. Plan for follow-up call in 5-7 days based on severity of symptoms and risk factors.  Plan for next call:  follow up symptoms, and all follow up visits scheduled.      Spoke with: Patient who is 20 yo female readmitted within 30 days of last admission. Admitted 01/26/21 to 01/31/21 for dx of Sepsis.  Patient reports she feels well except for continued abdominal pain and nausea, which is ongoing. States medications help some and she is trying to eat small amounts several times a day. Taking medications as directed. States her mom Dr.Shannon Mendoza is arranging all follow up appointments. Asked ACM to call her mom to inquire if assistance needed with scheduling or other needs. ACM left VM for patient's mom to call if any assistance needed. HH scheduled to resume services today. Patient denies any new concerns or questions.     Partial Discharge Summary   Admit 01/26/21 Discharge 01/31/21  Diagnosis:  Sepsis on presentation, severe, blood cultures negative, initially ttreated with Zosyn+ Vancomycin+ Doxycycline, improved. Required transitory stay in ICU. Seen by infectious disease and Gastroenterology.  Primary exposures are extensive travel -- most recently [place] and [place]; and rabbit exposure. The rabbit looked like a wild rabbit and was known to bite but did not bite her, and then died suddenly in 07-22-2020.   Pending workup for  Leptospirosis pending  Fasciola hepatica (serology not available; will keep looking for  lab to complete)  Strongyloidiosis negative  Brucella ab negative  Blood smear looking for bartonella bacilliformis negative  Hepatitis A negative  Tularemia  karius next generation pcr     Hospital course:   Per HPI:  20 y.o. female with medical history of  highly functional autism, depression, chronic gastroparesis followed by [facility], who presented with nausea, emesis and abdominal pain.  Mother Dr. Lynann Mendoza is at bedside and relates history.   She has had a local  GI workup s/op EGD/ colonoscopy. Admitted to [facility] for electrolyte issues. Seen at [facility] and recommending motegrity with the diagnosis of idiopathic gastroparesis.   She has not started the motegrity yet but has this on hand.  Continues to have nausea/ emesis abdominal pain. Poor oral intake. No longer able to function well/ stopped working.   Has been getting IVF at home.   Meets sepsis criteria due to lactic acidosis, fever, tachycardia, hypotension.   S/p vancomycin/ zosyn.   Blood cultures ordered.   CXR negative.   UA negative.   COVID and flu negative.   She has electrolytes that are low and being supplemented.   Patient admitted and treated as above.  On day of discharge, patient exam showed mild LUQ tenderness, tolerated oral intake well, afebrile and otherwise asymptomatic.  Discharge Instructions:    PCP in 1 week, check CBC with differential/ BMP/ Mg in 10 days, diet and activity as tolerated  Patient also to follow up with Hem/Onc for anemia and low platelets, [facility] for ongoing abdominal pain and nausea and Infectious Disease for follow up of all lab test.     Care Transitions 24 Hour Call    Schedule Follow Up Appointment with PCP: Completed  Do you have a copy of your discharge instructions?: Yes  Do you have all of your prescriptions and are they filled?: Yes  Have you been contacted by a [facility] Pharmacist?: No  Have you scheduled your follow up appointment?: Yes  How are you going to get to your appointment?: Car - family or friend to transport  Do you have support at home?: Parent(s)  Do you feel like you have everything you need to keep you well at home?: Yes  Are you an active caregiver in your home?: No  Care Transitions Interventions     Other Services: Completed (Comment: referred to [facility] for nursing support)     Registered Dietician: Completed             Follow Up  Future Appointments   Date Time Provider [place]   02/01/2021  3:00 PM Gwenette Greet, RN [place], LPN

## 2021-02-02 LAB — VITAMIN C: Vitamin C: 0.9 mg/dL (ref 0.4–2.0)

## 2021-02-02 LAB — COPPER, SERUM: Copper: 65 ug/dL — ABNORMAL LOW (ref 80–158)

## 2021-02-05 ENCOUNTER — Encounter: Admit: 2021-02-05 | Discharge: 2021-02-05 | Payer: BLUE CROSS/BLUE SHIELD

## 2021-02-05 ENCOUNTER — Encounter

## 2021-02-07 NOTE — Care Coordination-Inpatient (Signed)
[facility] Care Transitions Follow Up Call    Care Transition Nurse contacted the patient by telephone to follow up after admission on 01/26/2021.  Verified name and DOB with patient as identifiers.    Patient: Robin Mendoza  Patient DOB: 09/20/2000   MRN: [id number]  Reason for Admission: Sepsis  Discharge Date: 01/31/21 RARS: Readmission Risk Score: 12.1      Needs to be reviewed by the provider   Additional needs identified to be addressed with provider: No               ACM spoke with patient who reports she is doing ok, continues to have abdominal pain and nausea, managed with medications. Taking medications as directed and has all follow up scheduled. Denies any new concerns or questions. States ACM may need to call her mom regarding additional needs.   10:23am ACM called patient's mom, Dr.Shannon Wynetta Emery who request a call back later today due to working at time of call.   3:52pm Called Mom, who reports Robin Mendoza is doing well from bacteremia stand point with no further symptoms, but has had a couple rough days with decreased PO intake due to nausea. Hopeful of follow up with GI and Oncology  will provide more options to deal with nausea. Reports Motegrity and Zofran not covered by insurance, and paid self pay for both, concerned that if they prove effective with symptoms, will be cost prohibitive.Curious if ACM may be able to assist with authorizations.  Advised ACM will Research officer, trade union and pharmacy with request.   Denies any other concerns or questions.  Advised this ACM will be on medical leave starting 02/12/2021 and team member Hyman Hopes, RN ACM will follow up.     Discussed follow-up appointments. If no appointment was previously scheduled, appointment scheduling offered: Patient has all scheduled follow up appointment arranged.     Follow Up  Future Appointments   Date Time Provider [place]   02/08/2021 To Be Determined Jeanine Luz, MSW [place]   02/12/2021 To Be Determined  Juanda Bond, RN [place]   02/14/2021  8:00 AM [facility] MRI UNIT 1 SF[facility]MRI [facility]   02/19/2021 To Be Determined Juanda Bond, RN [place]   02/25/2021  8:00 AM Lurene Shadow, MD [facility]   02/26/2021 To Be Determined Juanda Bond, RN [place]     Non-[facility] follow up appointment(s): Dr. Ananias Pilgrim, [facility]     Care Transition Nurse reviewed medical action plan and red flags with patient and discussed any barriers to care and/or understanding of plan of care after discharge. Discussed appropriate site of care based on symptoms and resources available to patient including: PCP  Specialist  Home health  When to call [place]. The patient agrees to contact the PCP office for questions related to their healthcare.     Advance Care Planning:   not on file.     Patients top risk factors for readmission:  Complications or new or worsening symptoms  Interventions to address risk factors:  Ensured follow up with all specialist, encouraged compliance with medications, diet and bowel regime.         Care Transition Nurse provided contact information for future needs.  Patient will be followed by Hyman Hopes RN,ACM while this ACM is on FMLA  based on severity of symptoms and risk factors.  Plan for next call:  Follow up after MD visits    Suanne Marker  Lynett Grimes, LPN

## 2021-02-11 ENCOUNTER — Encounter

## 2021-02-11 LAB — APTT: PTT: 29.4 s (ref 24.1–35.1)

## 2021-02-11 LAB — PROTIME-INR
INR: 1
Protime: 14 s (ref 12.6–14.5)

## 2021-02-11 NOTE — Telephone Encounter (Signed)
 Telephone Encounter by Purcell Sharper at 02/11/21 1259                Author: Purcell Sharper  Service: --  Author Type: Technician       Filed: 02/11/21 1300  Encounter Date: 02/11/2021  Status: Signed          Editor: Purcell Sharper (Technician)               From: Arnaldo Givens B < [email or web address]>   Sent: Friday, February 08, 2021 5:11 PM   To: Clinical Rx [email or web address] >  Cc: [facility] < [email or web address]>; Viann, Cassandra [email or web address] >  Subject: Safe Mail Referral      Patient Robin Mendoza  DOB 08/11/2000  FM#[id number]   Patient with history of Abdominal pain, nausea and Vomiting since 05/2020. Decreased PO intake due to persistent N/V. Extensive GI work up with [facility]   [facility] and [facility]. Recently Hospitalized at [facility] for Failure to thrive, malnutrition with BMI 16. Recent  hospitalization at  Clinic with Sepsis with no known source and had another GI consult. Now with dx Gastroparesis with prescription for Motegrity  2mg . Also had hematology/oncology consult due anemia and thrombocytopenia, who also discussed persistent/constant  nausea and prescribed Zofran  Patch.    Patient's mother Dr.Shannon Mendoza, reports neither of the medications were covered under insurance. She paid self-pay in hopes the medications would be beneficial. Discussions now have been initiated for possible feeding tube.      I am asking for pharmacy to please discuss medications with patient's mom for any formulary alternatives or  if/how Motegrity  or Zofran  Patch could be covered by insurance.      FYI: I will be going out on FMLA on 02/12/2021 for surgery. If you have more questions or need to discuss this patient with one of my team members, please contact [facility] RN BSN Associate Care Team Lead  Cell [phone] or Calton Viann, RN,ACM  cell [phone].    Thank you for your assistance.       Givens Arnaldo, LPN   [facility] Care Coordinator   [facility]   [address]  [place]  [address]   C [phone] F [phone]   [email or web address]   [facility]  [email or web address]

## 2021-02-11 NOTE — Telephone Encounter (Signed)
Called patient's mother and scheduled appt. 02/12/2021 @ 1400    Please note this is a [place] patient    For Pharmacy Admin Tracking Only    CPA in place: No  Recommendation Provided To: Patient/Caregiver: 1 via Telephone  Intervention Detail: Scheduled Appointment  Gap Closed?: Yes  Intervention Accepted By: Patient/Caregiver: 1  Time Spent (min): 15

## 2021-02-12 ENCOUNTER — Encounter: Primary: Obstetrics & Gynecology

## 2021-02-12 ENCOUNTER — Encounter: Admit: 2021-02-12 | Discharge: 2021-02-12 | Payer: BLUE CROSS/BLUE SHIELD

## 2021-02-12 ENCOUNTER — Encounter

## 2021-02-12 LAB — COMPREHENSIVE METABOLIC PANEL
ALT: 46 U/L (ref 12–65)
AST: 32 U/L (ref 15–37)
Albumin/Globulin Ratio: 1.1 — ABNORMAL LOW (ref 1.2–3.5)
Albumin: 3.6 g/dL (ref 3.5–5.0)
Alk Phosphatase: 161 U/L — ABNORMAL HIGH (ref 50–136)
Anion Gap: 7 mmol/L (ref 4–13)
BUN: 15 MG/DL (ref 6–23)
CO2: 24 mmol/L (ref 21–32)
Calcium: 9.4 MG/DL (ref 8.3–10.4)
Chloride: 110 mmol/L (ref 101–110)
Creatinine: 0.8 MG/DL (ref 0.6–1.0)
Est, Glom Filt Rate: >60 mL/min/{1.73_m2} (ref >60)
Globulin: 3.4 g/dL (ref 2.3–3.5)
Glucose: 94 mg/dL (ref 65–100)
Potassium: 4.5 mmol/L (ref 3.5–5.1)
Sodium: 141 mmol/L (ref 136–145)
Total Bilirubin: 0.4 MG/DL (ref 0.2–1.1)
Total Protein: 7 g/dL (ref 6.3–8.2)

## 2021-02-12 LAB — CBC WITH AUTO DIFFERENTIAL
Absolute Eos #: 0.1 10*3/uL (ref 0.0–0.8)
Absolute Immature Granulocyte: 0 10*3/uL (ref 0.0–0.5)
Absolute Lymph #: 2.7 10*3/uL (ref 0.5–4.6)
Absolute Mono #: 0.6 10*3/uL (ref 0.1–1.3)
Basophils Absolute: 0.1 10*3/uL (ref 0.0–0.2)
Basophils: 2 % (ref 0.0–2.0)
Eosinophils %: 1 % (ref 0.5–7.8)
Hematocrit: 34.7 % — ABNORMAL LOW (ref 35.8–46.3)
Hemoglobin: 10.6 g/dL — ABNORMAL LOW (ref 11.7–15.4)
Immature Granulocytes: 0 % (ref 0.0–5.0)
Lymphocytes: 50 % — ABNORMAL HIGH (ref 13–44)
MCH: 28.8 PG (ref 26.1–32.9)
MCHC: 30.5 g/dL — ABNORMAL LOW (ref 31.4–35.0)
MCV: 94.3 FL (ref 79.6–97.8)
MPV: 10.9 FL (ref 9.4–12.3)
Monocytes: 10 % (ref 4.0–12.0)
Platelets: 575 10*3/uL — ABNORMAL HIGH (ref 150–450)
RBC: 3.68 M/uL — ABNORMAL LOW (ref 4.05–5.2)
RDW: 13 % (ref 11.9–14.6)
Seg Neutrophils: 37 % — ABNORMAL LOW (ref 43–78)
Segs Absolute: 2.1 10*3/uL (ref 1.7–8.2)
WBC: 5.6 10*3/uL (ref 4.3–11.1)
nRBC: 0 10*3/uL (ref 0.0–0.2)

## 2021-02-12 LAB — PHOSPHORUS: Phosphorus: 2.5 MG/DL (ref 2.5–4.5)

## 2021-02-12 LAB — VITAMIN D 25 HYDROXY: Vit D, 25-Hydroxy: 27.3 ng/mL — ABNORMAL LOW (ref 30.0–100.0)

## 2021-02-12 LAB — MAGNESIUM: Magnesium: 2 mg/dL (ref 1.8–2.4)

## 2021-02-12 NOTE — Telephone Encounter (Signed)
 CLINICAL PHARMACY NOTE - Medication Review  Patient outreach to review medications - Spoke with Other: mother .      SUBJECTIVE/OBJECTIVE:   Robin Mendoza is a 20 y.o. female referred to a clinical pharmacy specialist by care coordination.    Medications:  Current Outpatient Medications   Medication Sig Dispense Refill    promethazine  (PHENERGAN ) 25 mg tablet Take 25 mg by mouth every six (6) hours as needed for Nausea.      lansoprazole  (Prevacid ) 30 mg capsule Take 30 mg by mouth two (2) times a day.      PREDNISONE PO Take  by mouth daily. Unsure of dose      ondansetron  (ZOFRAN  ODT) 4 mg disintegrating tablet Take 1 Tablet by mouth every eight (8) hours as needed for Nausea. 8 Tablet 2    sertraline  HCl (SERTRALINE  PO) Take 50 mg by mouth daily.      guanfacine  HCl (INTUNIV  ER PO) Take 4 mg by mouth daily.      lisdexamfetamine dimesylate (VYVANSE PO) Take  by mouth daily.         Additional Medications (including OTC/Herbal Supplements):  na    Allergies:   Allergies   Allergen Reactions    Milk Anaphylaxis    Milk Containing Products Anaphylaxis    Cat Dander Itching    Dog Dander Itching       Pertinent Labs/Vitals:  BP Readings from Last 3 Encounters:   09/17/20 111/76   10/22/19 119/72   07/02/19 130/65     No components found for: LABMICR, MALB24HUR  No components found for: LABA1C  No results found for: CHOL, HDL, LDLDIRECT  ALT (SGPT)   Date Value Ref Range Status   09/17/2020 23 12 - 65 U/L Final     AST (SGOT)   Date Value Ref Range Status   09/17/2020 17 15 - 37 U/L Final     The ASCVD Risk score (Arnett DK, et al., 2019) failed to calculate for the following reasons:    The 2019 ASCVD risk score is only valid for ages 42 to 57     No components found for: CREATININE    Estimated Creatinine Clearance: 82.9 mL/min (by C-G formula based on SCr of 0.8 mg/dL).    Social History:   Social History     Tobacco Use    Smoking status: Never    Smokeless tobacco: Never   Substance Use Topics     Alcohol use: Not Currently       Immunizations:   Most Recent Immunizations   Administered Date(s) Administered    COVID-19, PFIZER PURPLE top, DILUTE for use, (age 64 y+), IM, 30mcg/0.3mL 04/09/2020    HPV (9-valent) 07/07/2018     ASSESSMENT/PLAN:   - Mediation assistance    Per ACM:  Patient with history of Abdominal pain, nausea and Vomiting since 05/2020. Decreased PO intake due to persistent N/V. Extensive GI work up with [facility]   [facility]. Recently Hospitalized at [facility] for Failure to thrive, malnutrition with BMI 16. Recent hospitalization at [facility] with Sepsis with no known source and had another GI consult. Now with dx Gastroparesis with prescription for Motegrity  2mg . Also had hematology/oncology consult due anemia and thrombocytopenia, who also discussed persistent/constant nausea and prescribed Zofran  Patch.   Patient's mother Dr.Shannon Cieslewicz, reports neither of the medications were covered under insurance. She paid self-pay in hopes the medications would be beneficial. Discussions now have been initiated for possible feeding tube.  I am asking for pharmacy to please discuss medications with patient's mom for any formulary alternatives or  if/how Motegrity  or Zofran  Patch could be covered by insurance.    Spoke to [facility] home delivery pharmacy who states motegrity  is not covered and applying coupon card would bring it to 1132.57 dollars with coupon only. Mom was able to get the medication from [facility] for $600 and something but will not be able to continue to afford this.     Discussed case with benefit team who states:  I see that a Motegrity  PA was denied in September. The patient/provider can appeal the decision. It would then be reviewed by our internal Armed forces training and education officer. The directions on how to start an appeal should be included in the denial letter that was sent to patient / provider.     Typically, with Motegrtiy, we need to know why the patient can't use the  formulary alternatives. Detailed chart notes are a definite plus. Linzess  was listed as tried/failed med but no details were given (within the PA request or chart notes that were submitted with the PA).     I don't see a Zofran  patch in her claims, only tablets (last paid claim in Sept 2022). There's a denied claim for Sancuso  patches and looks like a history of scopolamine patches (last paid claim 01/02/21).     If she's trying to get Sancuso , she just needs to submit for a PA and then appeal as well.     Benefits recommended patient/family call associate services, explain what is going on and then a case will be opened and someone in benefit team with get assigned the case.     Educated mom to appeal the PA denial for motegrity  and start that process and provide as much clinical information and chart notes as possible.     Call back number for myself provided to patient's mother for further assistance if needed.       - Upcoming appointments:   Future Appointments   Date Time Provider Department Center   02/12/2021  2:00 PM [facility]       Camie Pitman, PharmD, BCPS  Population Health Pharmacy  [facility] Clinical Pharmacist  Department: [phone]    For Pharmacy Admin Tracking Only    CPA in place: No  Recommendation Provided To: Patient/Caregiver: 1 via Telephone and Pharmacy: 1  Gap Closed?: Yes  Intervention Accepted By: Patient/Caregiver: 1 and Pharmacy: 1  Time Spent (min): 45

## 2021-02-14 ENCOUNTER — Inpatient Hospital Stay: Admit: 2021-02-14 | Payer: BLUE CROSS/BLUE SHIELD | Attending: Anesthesiology

## 2021-02-14 DIAGNOSIS — R112 Nausea with vomiting, unspecified: Secondary | ICD-10-CM

## 2021-02-14 LAB — CULTURE, URINE: Culture: <10000

## 2021-02-14 MED ORDER — ONDANSETRON HCL 4 MG/2ML IJ SOLN
4 MG/2ML | Freq: Once | INTRAMUSCULAR | Status: DC | PRN
Start: 2021-02-14 — End: 2021-02-14

## 2021-02-14 MED ORDER — PROPOFOL 200 MG/20ML IV EMUL
200 MG/20ML | INTRAVENOUS | Status: DC | PRN
Start: 2021-02-14 — End: 2021-02-14
  Administered 2021-02-14: 12:00:00 100 via INTRAVENOUS

## 2021-02-14 MED ORDER — SODIUM CHLORIDE 0.9 % IV SOLN
0.9 % | Freq: Once | INTRAVENOUS | Status: AC
Start: 2021-02-14 — End: 2021-02-14
  Administered 2021-02-14: 15:00:00 6.25 mg via INTRAVENOUS

## 2021-02-14 MED ORDER — GADOTERIDOL 279.3 MG/ML IV SOLN
279.3 MG/ML | Freq: Once | INTRAVENOUS | Status: AC | PRN
Start: 2021-02-14 — End: 2021-02-14
  Administered 2021-02-14: 13:00:00 10 mL via INTRAVENOUS

## 2021-02-14 MED ORDER — ROCURONIUM BROMIDE 50 MG/5ML IV SOLN
505 MG/5ML | INTRAVENOUS | Status: DC | PRN
Start: 2021-02-14 — End: 2021-02-14
  Administered 2021-02-14: 12:00:00 5 via INTRAVENOUS

## 2021-02-14 MED ORDER — OXYCODONE HCL 5 MG PO TABS
5 MG | ORAL | Status: AC | PRN
Start: 2021-02-14 — End: 2021-02-14
  Administered 2021-02-14: 14:00:00 5 mg via ORAL

## 2021-02-14 MED ORDER — FENTANYL CITRATE (PF) 100 MCG/2ML IJ SOLN
100 MCG/2ML | INTRAMUSCULAR | Status: DC | PRN
Start: 2021-02-14 — End: 2021-02-14

## 2021-02-14 MED ORDER — LIDOCAINE HCL (PF) 2 % IJ SOLN
2 % | INTRAMUSCULAR | Status: DC | PRN
Start: 2021-02-14 — End: 2021-02-14
  Administered 2021-02-14: 12:00:00 50 via INTRAVENOUS

## 2021-02-14 MED ORDER — SUCCINYLCHOLINE CHLORIDE 20 MG/ML IJ SOLN
20 MG/ML | INTRAMUSCULAR | Status: DC | PRN
Start: 2021-02-14 — End: 2021-02-14
  Administered 2021-02-14: 12:00:00 100 via INTRAVENOUS

## 2021-02-14 MED ORDER — ONDANSETRON HCL 4 MG/2ML IJ SOLN
4 MG/2ML | INTRAMUSCULAR | Status: DC | PRN
Start: 2021-02-14 — End: 2021-02-14
  Administered 2021-02-14: 13:00:00 4 via INTRAVENOUS

## 2021-02-14 MED ORDER — LACTATED RINGERS IV SOLN
INTRAVENOUS | Status: DC | PRN
Start: 2021-02-14 — End: 2021-02-14
  Administered 2021-02-14: 12:00:00 via INTRAVENOUS

## 2021-02-14 MED ORDER — DEXAMETHASONE 4 MG/ML IJ SOLN (MIXTURES ONLY)
4 MG/ML | INTRAMUSCULAR | Status: DC | PRN
Start: 2021-02-14 — End: 2021-02-14
  Administered 2021-02-14: 13:00:00 4 via INTRAVENOUS

## 2021-02-14 MED ORDER — DIPHENHYDRAMINE HCL 50 MG/ML IJ SOLN
50 MG/ML | Freq: Once | INTRAMUSCULAR | Status: DC | PRN
Start: 2021-02-14 — End: 2021-02-14

## 2021-02-14 MED ORDER — SODIUM CHLORIDE FLUSH 0.9 % IV SOLN
0.9 % | INTRAVENOUS | Status: DC | PRN
Start: 2021-02-14 — End: 2021-02-14
  Administered 2021-02-14: 13:00:00 10 mL via INTRAVENOUS

## 2021-02-14 MED ORDER — HYDROMORPHONE HCL PF 2 MG/ML IJ SOLN
2 MG/ML | INTRAMUSCULAR | Status: DC | PRN
Start: 2021-02-14 — End: 2021-02-14

## 2021-02-14 MED ORDER — METOCLOPRAMIDE HCL 5 MG/ML IJ SOLN
5 MG/ML | Freq: Once | INTRAMUSCULAR | Status: AC | PRN
Start: 2021-02-14 — End: 2021-02-14
  Administered 2021-02-14: 14:00:00 10 mg via INTRAVENOUS

## 2021-02-14 MED ORDER — PROMETHAZINE HCL 25 MG/ML IJ SOLN
25 MG/ML | INTRAMUSCULAR | Status: DC
Start: 2021-02-14 — End: 2021-02-14

## 2021-02-14 MED ORDER — OXYCODONE HCL 5 MG PO TABS
5 MG | ORAL | Status: AC | PRN
Start: 2021-02-14 — End: 2021-02-14

## 2021-02-14 MED FILL — OXYCODONE HCL 5 MG PO TABS: 5 MG | ORAL | Qty: 1

## 2021-02-14 MED FILL — PROMETHAZINE HCL 25 MG/ML IJ SOLN: 25 MG/ML | INTRAMUSCULAR | Qty: 0.25

## 2021-02-14 MED FILL — ONDANSETRON HCL 4 MG/2ML IJ SOLN: 4 MG/2ML | INTRAMUSCULAR | Qty: 2

## 2021-02-14 MED FILL — METOCLOPRAMIDE HCL 5 MG/ML IJ SOLN: 5 MG/ML | INTRAMUSCULAR | Qty: 2

## 2021-02-14 NOTE — Other (Signed)
Discharge instructions reviewed with pt and family member who verbalize understanding of follow up care. With mother before she returned to work. Pt to be taken home with family friend per pt's mother

## 2021-02-14 NOTE — Discharge Instructions (Signed)
ACTIVITY  As tolerated and as directed by your doctor.   Bathe or shower as directed by your doctor.     DIET  Clear liquids until no nausea or vomiting; then light diet for the first day.  Advance to regular diet on second day, unless your doctor orders otherwise.   If nausea and vomiting continues, call your doctor.     PAIN  Take pain medication as directed by your doctor.   Call your doctor if pain is NOT relieved by medication.   DO NOT take aspirin of blood thinners unless directed by your doctor.     MEDICATION INTERACTION:During your procedure you potentially received a medication or medications which may reduce the effectiveness of oral contraceptives. Please consider other forms of contraception for 1 month following your procedure if you are currently using oral contraceptives as your primary form of birth control. In addition to this, we recommend continuing your oral contraceptive as prescribed, unless otherwise instructed by your physician, during this time      CALL YOUR DOCTOR IF   Excessive bleeding that does not stop after holding pressure over the area  Temperature of 101 degrees F or above  Excessive redness, swelling or bruising, and/ or green or yellow, smelly discharge from incision    After general anesthesia or intravenous sedation, for 24 hours or while taking prescription Narcotics:  Limit your activities  A responsible adult needs to be with you for the next 24 hours  Do not drive and operate hazardous machinery  Do not make important personal or business decisions  Do not drink alcoholic beverages  If you have not urinated within 8 hours after discharge, and you are experiencing discomfort from urinary retention, please go to the nearest ED.  If you have sleep apnea and have a CPAP machine, please use it for all naps and sleeping.  Please use caution when taking narcotics and any of your home medications that may cause drowsiness.  *  Please give a list of your current medications to  your Primary Care Provider.  *  Please update this list whenever your medications are discontinued, doses are      changed, or new medications (including over-the-counter products) are added.  *  Please carry medication information at all times in case of emergency situations.    These are general instructions for a healthy lifestyle:  No smoking/ No tobacco products/ Avoid exposure to second hand smoke  Surgeon General's Warning:  Quitting smoking now greatly reduces serious risk to your health.  Obesity, smoking, and sedentary lifestyle greatly increases your risk for illness  A healthy diet, regular physical exercise & weight monitoring are important for maintaining a healthy lifestyle    You may be retaining fluid if you have a history of heart failure or if you experience any of the following symptoms:  Weight gain of 3 pounds or more overnight or 5 pounds in a week, increased swelling in our hands or feet or shortness of breath while lying flat in bed.  Please call your doctor as soon as you notice any of these symptoms; do not wait until your next office visit.

## 2021-02-14 NOTE — Anesthesia Post-Procedure Evaluation (Signed)
Department of Anesthesiology  Postprocedure Note    Patient: Robin Mendoza  MRN: [id number]  Birthdate: 03-29-2001  Date of evaluation: 02/14/2021      Procedure Summary     Date: 02/14/21 Room / Location: [facility]    Anesthesia Start: [phone] Anesthesia Stop: [id number]    Procedure: MRI BRAIN W WO CONTRAST Diagnosis:       Intractable nausea and vomiting      Intractable nausea and vomiting      (intractable nausea)    Scheduled Providers: Marcene Brawn, MD; Doreene Adas, APRN - CRNA Responsible Provider: Marcene Brawn, MD    Anesthesia Type: general ASA Status: 3          Anesthesia Type: No value filed.    Aldrete Phase I: Aldrete Score: 8    Aldrete Phase II:        Anesthesia Post Evaluation    Patient location during evaluation: PACU  Patient participation: complete - patient participated  Level of consciousness: awake and awake and alert  Airway patency: patent  Nausea & Vomiting: no nausea  Complications: no  Cardiovascular status: hemodynamically stable  Respiratory status: acceptable  Hydration status: euvolemic  Multimodal analgesia pain management approach

## 2021-02-14 NOTE — Anesthesia Procedure Notes (Signed)
Airway  Date/Time: 02/14/2021 8:25 AM  Urgency: elective    Airway not difficult    General Information and Staff    Patient location during procedure: OR  Resident/CRNA: Doreene Adas, APRN - CRNA  Performed: resident/CRNA     Indications and Patient Condition  Indications for airway management: anesthesia  Spontaneous Ventilation: absent  Sedation level: deep  Preoxygenated: yes  Patient position: sniffing  MILS not maintained throughout  Mask difficulty assessment: vent by bag mask    Final Airway Details  Final airway type: endotracheal airway      Successful airway: ETT  Cuffed: yes   Successful intubation technique: direct laryngoscopy  Facilitating devices/methods: intubating stylet  Endotracheal tube insertion site: oral  Blade: Macintosh  Blade size: #3  ETT size (mm): 7.0  Cormack-Lehane Classification: grade I - full view of glottis  Placement verified by: chest auscultation and capnometry   Measured from: teeth  ETT to teeth (cm): 21  Number of attempts at approach: 1  Ventilation between attempts: bag mask    Additional Comments  Dentition and lips unchanged from preop

## 2021-02-14 NOTE — Other (Signed)
With family friend per mother.

## 2021-02-14 NOTE — H&P (Signed)
Marcene Brawn, MD   Anesthesiologist   Specialty:  Anesthesiology   Anesthesia Pre Procedure      Signed   Date of Service:  02/14/2021  7:03 AM                 Signed        Expand All Collapse All                                                                                                                                                                                                                                                                                                                                      Department of Anesthesiology  Preprocedure Note                                        Name:  Robin Mendoza           Age:  20 y.o.  DOB:  [birth year]                                                   MRN:  [id number]                                                                      Date:  02/14/2021              Surgeon: * No surgeons listed *  Procedure: * No procedures listed *     Medications prior to admission:   Home Medications           Prior to Admission medications    Medication Sig Start Date End Date Taking? Authorizing Provider   Prucalopride Succinate 2 MG TABS Take 2 mg by mouth daily.       Historical Provider, MD   promethazine (PHENERGAN) 12.5 MG tablet Take 1 tablet by mouth 4 times daily as needed for Nausea 01/31/21 05/01/21   Fonnie Jarvis, MD   granisetron (SANCUSO) 3.1 MG/24HR [facility] 1 patch onto the skin every 7 days Do not take zofran when using sancuso 01/31/21     Lannie Fields, APRN - NP   guanFACINE (INTUNIV) 1 MG TB24 extended release tablet Take 1 mg by mouth daily.       Historical Provider, MD   OLANZapine (ZYPREXA) 2.5 MG tablet Take 5 mg by mouth nightly       Historical Provider, MD   acetaminophen (TYLENOL) 500 MG tablet Take 500 mg by mouth every 6 hours as needed for Pain (breakthrough pain).       Historical Provider, MD   EPINEPHrine 0.3 MG/0.3ML SOSY Inject 0.3 mg into the muscle 1 (one) time if needed (allergic reaction).       Historical  Provider, MD   ondansetron (ZOFRAN-ODT) 8 MG TBDP disintegrating tablet Place 1 tablet under the tongue every 8 hours as needed for Nausea or Vomiting 10/05/20     Azalia Bilis, MD   Sertraline HCl (ZOLOFT PO) Take 75 mg by mouth daily  Patient not taking: Reported on 02/01/2021       Historical Provider, MD   Famotidine (PEPCID PO) Take 10 mg by mouth daily       Historical Provider, MD   levonorgestrel (MIRENA, 52 MG,) IUD 52 mg 1 each by IntraUTERine route once       Historical Provider, MD   VYVANSE 60 MG CHEW   09/12/20 01/31/21   Historical Provider, MD            Current medications:    Current Facility-Administered Medications   No current outpatient medications on file.      No current facility-administered medications for this visit.            Allergies:         Allergies   Allergen Reactions    Cow's Milk Anaphylaxis and Hives    Food Anaphylaxis    Lac Bovis Anaphylaxis    Dog Epithelium Allergy Skin Test Itching    Other Itching         Problem List:         Patient Active Problem List   Diagnosis Code    ADHD F90.9    Autism F84.0    Depression F32.A    GERD (gastroesophageal reflux disease) K21.9    Weight loss R63.4    Septic shock (HCC) A41.9, R65.21    Hypokalemia E87.6    Hypophosphatemia E83.39    Hypomagnesemia E83.42    Gastroparesis K31.84    Elevated liver enzymes R74.8    Anemia D64.9    Thrombocytopenia (HCC) D69.6    Intractable nausea and vomiting R11.2         Past Medical History:    Past Medical History             Diagnosis Date    ADHD       managed with  meds    Autism      Depression      GERD (gastroesophageal reflux disease)      Weight loss              Past Surgical History:    Past Surgical History             Procedure Laterality Date    CHOLECYSTECTOMY, LAPAROSCOPIC N/A 10/05/2020     CHOLECYSTECTOMY LAPAROSCOPIC performed by Azalia Bilis, MD at [facility] MAIN OR    COLONOSCOPY N/A 09/17/2020     COLONOSCOPY/BMI 21 performed by Flint Melter, MD at [facility] ENDOSCOPY     COLONOSCOPY N/A 01/14/2019     COLONOSCOPY/  BMI 21 PT IS AUTISTIC performed by Flint Melter, MD at [facility]       as a baby    LAPAROSCOPIC APPENDECTOMY N/A 10/05/2020     APPENDECTOMY LAPAROSCOPIC performed by Azalia Bilis, MD at [facility] MAIN OR    UPPER GASTROINTESTINAL ENDOSCOPY                Social History:    Social History           Tobacco Use    Smoking status: Never    Smokeless tobacco: Never   Substance Use Topics    Alcohol use: Not Currently                                 Counseling given: Not Answered        Vital Signs (Current):   Vitals   There were no vitals filed for this visit.                                                  BP Readings from Last 3 Encounters:   02/12/21 124/68   02/05/21 130/74   02/01/21 (!) 92/52         NPO Status:                                                                                  BMI:       Wt Readings from Last 3 Encounters:   01/30/21 118 lb 9.6 oz (53.8 kg)   10/22/20 107 lb (48.5 kg)   10/20/20 108 lb (49 kg)     There is no height or weight on file to calculate BMI.     CBC:         Lab Results   Component Value Date/Time     WBC 5.6 02/11/2021 12:26 PM     RBC 3.68 02/11/2021 12:26 PM     HGB 10.6 02/11/2021 12:26 PM     HCT 34.7 02/11/2021 12:26 PM     MCV 94.3 02/11/2021 12:26 PM     RDW 13.0 02/11/2021 12:26 PM     PLT 575 02/11/2021 12:26 PM         CMP:  Lab Results   Component Value Date/Time     NA 141 02/11/2021 12:26 PM     K 4.5 02/11/2021 12:26 PM     CL 110 02/11/2021 12:26 PM     CO2 24 02/11/2021 12:26 PM     BUN 15 02/11/2021 12:26 PM     CREATININE 0.80 02/11/2021 12:26 PM     GFRAA >60 01/31/2021 05:45 AM     AGRATIO 1.1 09/17/2020 01:38 PM     LABGLOM >60 02/11/2021 12:26 PM     GLUCOSE 94 02/11/2021 12:26 PM     PROT 7.0 02/11/2021 12:26 PM     CALCIUM 9.4 02/11/2021 12:26 PM     BILITOT 0.4 02/11/2021 12:26 PM     ALKPHOS 161 02/11/2021 12:26 PM     ALKPHOS 82 09/17/2020 01:38 PM     AST  32 02/11/2021 12:26 PM     ALT 46 02/11/2021 12:26 PM         POC Tests: No results for input(s): POCGLU, POCNA, POCK, POCCL, POCBUN, POCHEMO, POCHCT in the last 72 hours.     Coags:         Lab Results   Component Value Date/Time     PROTIME 14.0 02/11/2021 12:39 PM     INR 1.0 02/11/2021 12:39 PM     APTT 29.4 02/11/2021 12:39 PM         HCG (If Applicable):         Lab Results   Component Value Date     PREGTESTUR Negative 10/22/2020         ABGs: No results found for: PHART, PO2ART, PCO2ART, HCO3ART, BEART, O2SATART      Type & Screen (If Applicable):  No results found for: LABABO, LABRH     Drug/Infectious Status (If Applicable):        Lab Results   Component Value Date/Time     HEPCAB NONREACTIVE 01/28/2021 03:37 AM         COVID-19 Screening (If Applicable):         Lab Results   Component Value Date/Time     COVID19 Not detected 01/26/2021 05:35 AM     COVID19 Not Detected 01/07/2019 12:00 AM               Anesthesia Evaluation  Patient summary reviewed and Nursing notes reviewed   history of anesthetic complications: PONV.  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   Endo/Other:                              Abdominal:              Vascular:              Other Findings:                Anesthesia Plan        general      ASA 3      (Emend in preop - high risk for PONV. Mom will be coming back with [place].  I initially told the parents and [name] that she could push the lidocaine and the  propofol, as I did not see any safety issues for this accomodation with induction given her cardiovascular and pulmonary health, normal weight.  The mother and Altamese Carolina both stated she had done this before in our health system, which I believed given the details of which both recalled about her last anesthetics.  Additionally, I did not sense or perceive any malingering or intoxication seeking behavior.  [name] was very interested in what was going to happen to her body with intubation, drugs and their purpose, and anesthesia.  I sensed allowing her to push the 2 medications lidocaine and propofol would be more beneficial for her experience and post surgical life regarding this experience.      After informing the CRNA about the plan, there was a lot of concerns since this is almost never seen in adult practice.  I agreed since their was significant concerns, that I would not allow her to push any medications.  Multiple other CRNA's voiced the same concerns.  I spoke with the parents and [name] about this - this was devastating to [name] and her parents.  I apologized profusely to them.   )  Induction: intravenous.     MIPS: Prophylactic antiemetics administered.  Anesthetic plan and risks discussed with patient, father and mother.        Plan discussed with CRNA.     Attending anesthesiologist reviewed and agrees with Preprocedure content                       Marcene Brawn, MD   02/14/2021

## 2021-02-14 NOTE — Anesthesia Pre-Procedure Evaluation (Signed)
Department of Anesthesiology  Preprocedure Note       Name:  Robin Mendoza   Age:  20 y.o.  DOB:  2000-09-05                                          MRN:  [id number]         Date:  02/14/2021      Surgeon: * No surgeons listed *    Procedure: * No procedures listed *    Medications prior to admission:   Prior to Admission medications    Medication Sig Start Date End Date Taking? Authorizing Provider   Prucalopride Succinate 2 MG TABS Take 2 mg by mouth daily.    Historical Provider, MD   promethazine (PHENERGAN) 12.5 MG tablet Take 1 tablet by mouth 4 times daily as needed for Nausea 01/31/21 05/01/21  Fonnie Jarvis, MD   granisetron (SANCUSO) 3.1 MG/24HR [facility] 1 patch onto the skin every 7 days Do not take zofran when using sancuso 01/31/21   Lannie Fields, APRN - NP   guanFACINE (INTUNIV) 1 MG TB24 extended release tablet Take 1 mg by mouth daily.    Historical Provider, MD   OLANZapine (ZYPREXA) 2.5 MG tablet Take 5 mg by mouth nightly    Historical Provider, MD   acetaminophen (TYLENOL) 500 MG tablet Take 500 mg by mouth every 6 hours as needed for Pain (breakthrough pain).    Historical Provider, MD   EPINEPHrine 0.3 MG/0.3ML SOSY Inject 0.3 mg into the muscle 1 (one) time if needed (allergic reaction).    Historical Provider, MD   ondansetron (ZOFRAN-ODT) 8 MG TBDP disintegrating tablet Place 1 tablet under the tongue every 8 hours as needed for Nausea or Vomiting 10/05/20   Azalia Bilis, MD   Sertraline HCl (ZOLOFT PO) Take 75 mg by mouth daily  Patient not taking: Reported on 02/01/2021    Historical Provider, MD   Famotidine (PEPCID PO) Take 10 mg by mouth daily    Historical Provider, MD   levonorgestrel (MIRENA, 52 MG,) IUD 52 mg 1 each by IntraUTERine route once    Historical Provider, MD   VYVANSE 60 MG CHEW  09/12/20 01/31/21  Historical Provider, MD       Current medications:    No current outpatient medications on file.     No current facility-administered medications for this visit.        Allergies:    Allergies   Allergen Reactions   ??? Cow's Milk Anaphylaxis and Hives   ??? Food Anaphylaxis   ??? Lac Bovis Anaphylaxis   ??? Dog Epithelium Allergy Skin Test Itching   ??? Other Itching       Problem List:    Patient Active Problem List   Diagnosis Code   ??? ADHD F90.9   ??? Autism F84.0   ??? Depression F32.A   ??? GERD (gastroesophageal reflux disease) K21.9   ??? Weight loss R63.4   ??? Septic shock (HCC) A41.9, R65.21   ??? Hypokalemia E87.6   ??? Hypophosphatemia E83.39   ??? Hypomagnesemia E83.42   ??? Gastroparesis K31.84   ??? Elevated liver enzymes R74.8   ??? Anemia D64.9   ??? Thrombocytopenia (HCC) D69.6   ??? Intractable nausea and vomiting R11.2       Past Medical History:        Diagnosis Date   ???  ADHD     managed with meds   ??? Autism    ??? Depression    ??? GERD (gastroesophageal reflux disease)    ??? Weight loss        Past Surgical History:        Procedure Laterality Date   ??? CHOLECYSTECTOMY, LAPAROSCOPIC N/A 10/05/2020    CHOLECYSTECTOMY LAPAROSCOPIC performed by Azalia Bilis, MD at [facility] MAIN OR   ??? COLONOSCOPY N/A 09/17/2020    COLONOSCOPY/BMI 21 performed by Flint Melter, MD at [facility] ENDOSCOPY   ??? COLONOSCOPY N/A 01/14/2019    COLONOSCOPY/  BMI 21 PT IS AUTISTIC performed by Flint Melter, MD at [facility]   ??? EAR SURGERY Bilateral     as a baby   ??? LAPAROSCOPIC APPENDECTOMY N/A 10/05/2020    APPENDECTOMY LAPAROSCOPIC performed by Azalia Bilis, MD at [facility] MAIN OR   ??? UPPER GASTROINTESTINAL ENDOSCOPY         Social History:    Social History     Tobacco Use   ??? Smoking status: Never   ??? Smokeless tobacco: Never   Substance Use Topics   ??? Alcohol use: Not Currently                                Counseling given: Not Answered      Vital Signs (Current):   There were no vitals filed for this visit.                                           BP Readings from Last 3 Encounters:   02/12/21 124/68   02/05/21 130/74   02/01/21 (!) 92/52       NPO Status:                                                                                  BMI:   Wt Readings from Last 3 Encounters:   01/30/21 118 lb 9.6 oz (53.8 kg)   10/22/20 107 lb (48.5 kg)   10/20/20 108 lb (49 kg)     There is no height or weight on file to calculate BMI.    CBC:   Lab Results   Component Value Date/Time    WBC 5.6 02/11/2021 12:26 PM    RBC 3.68 02/11/2021 12:26 PM    HGB 10.6 02/11/2021 12:26 PM    HCT 34.7 02/11/2021 12:26 PM    MCV 94.3 02/11/2021 12:26 PM    RDW 13.0 02/11/2021 12:26 PM    PLT 575 02/11/2021 12:26 PM       CMP:   Lab Results   Component Value Date/Time    NA 141 02/11/2021 12:26 PM    K 4.5 02/11/2021 12:26 PM    CL 110 02/11/2021 12:26 PM    CO2 24 02/11/2021 12:26 PM    BUN 15 02/11/2021 12:26 PM    CREATININE 0.80 02/11/2021 12:26 PM    GFRAA >60 01/31/2021 05:45 AM    AGRATIO 1.1  09/17/2020 01:38 PM    LABGLOM >60 02/11/2021 12:26 PM    GLUCOSE 94 02/11/2021 12:26 PM    PROT 7.0 02/11/2021 12:26 PM    CALCIUM 9.4 02/11/2021 12:26 PM    BILITOT 0.4 02/11/2021 12:26 PM    ALKPHOS 161 02/11/2021 12:26 PM    ALKPHOS 82 09/17/2020 01:38 PM    AST 32 02/11/2021 12:26 PM    ALT 46 02/11/2021 12:26 PM       POC Tests: No results for input(s): POCGLU, POCNA, POCK, POCCL, POCBUN, POCHEMO, POCHCT in the last 72 hours.    Coags:   Lab Results   Component Value Date/Time    PROTIME 14.0 02/11/2021 12:39 PM    INR 1.0 02/11/2021 12:39 PM    APTT 29.4 02/11/2021 12:39 PM       HCG (If Applicable):   Lab Results   Component Value Date    PREGTESTUR Negative 10/22/2020        ABGs: No results found for: PHART, PO2ART, PCO2ART, HCO3ART, BEART, O2SATART     Type & Screen (If Applicable):  No results found for: LABABO, LABRH    Drug/Infectious Status (If Applicable):  Lab Results   Component Value Date/Time    HEPCAB NONREACTIVE 01/28/2021 03:37 AM       COVID-19 Screening (If Applicable):   Lab Results   Component Value Date/Time    COVID19 Not detected 01/26/2021 05:35 AM    COVID19 Not Detected 01/07/2019 12:00 AM           Anesthesia  Evaluation  Patient summary reviewed and Nursing notes reviewed   history of anesthetic complications: PONV.  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   Endo/Other:                     Abdominal:             Vascular:          Other Findings:             Anesthesia Plan      general     ASA 3     (Emend in preop - high risk for PONV. Mom will be coming back with [place].  I initially told the parents and [name] that she could push the lidocaine and the propofol, as I did not see any safety issues for this accomodation with induction given her cardiovascular and pulmonary health, normal weight.  The mother and Altamese Carolina both stated she had done this before in our health system, which I believed given the details of which both recalled  about her last anesthetics.  Additionally, I did not sense or perceive any malingering or intoxication seeking behavior.  [name] was very interested in what was going to happen to her body with intubation, drugs and their purpose, and anesthesia.  I sensed allowing her to push the 2 medications lidocaine and propofol would be more beneficial for her experience and post surgical life regarding this experience.     After informing the CRNA about the plan, there was a lot of concerns since this is almost never seen in adult practice.  I agreed since their was significant concerns, that I would not allow her to push any medications.  Multiple other CRNA's voiced the same concerns.  I spoke with the parents and  [name] about this - this was devastating to [name] and her parents.  I apologized profusely to them.   )  Induction: intravenous.    MIPS: Prophylactic antiemetics administered.  Anesthetic plan and risks discussed with patient, father and mother.      Plan discussed with CRNA.    Attending anesthesiologist reviewed and agrees with Preprocedure content                Marcene Brawn, MD   02/14/2021

## 2021-02-15 NOTE — Care Coordination-Inpatient (Signed)
Chart reviewed. Will have ACM assigned follow up with patient/parent.    Toni Arthurs BSN, RN- [facility] CCM  Associate Care Manager  [phone]

## 2021-02-19 ENCOUNTER — Encounter: Admit: 2021-02-19 | Discharge: 2021-02-19 | Payer: BLUE CROSS/BLUE SHIELD

## 2021-02-19 ENCOUNTER — Encounter

## 2021-02-19 LAB — MISCELLANEOUS SENDOUT

## 2021-02-22 NOTE — Care Coordination-Inpatient (Signed)
Ambulatory Care Coordination Note  02/22/2021    ACC: Hyman Hopes, RN    Ambulatory Care Manager [facility]) contacted the patient by telephone to follow up on progress, discuss new issues or concerns, and reinforce/ provide patient education. Verified name and DOB with patient as identifiers.    Patient states n/v/d is no change and is constant. States medications are not helping. Having some abdominal pain. States IV fluids at home were stopped. States she is getting about 24 oz of fluid per day. States she is urinating well. States she is not eating much at meals. Barely getting 1500 calories per day.   Patient followed up with PCP on 02/13/2021.   Patient states her heart races. Heart rate can be as low as 40 at night and as high as 180 during the day. Patient wears a fitbit to track this. Patient was referred to cardiology. Patient states she has an appt for this but not sure when. States her mother takes care of appointments. Per chart, appt is scheduled for 03/07/2021 with [facility].     Reinforced/ Provided Education:  Discussed red flags and appropriate site of care based on symptoms and resources available to patient including: PCP  Specialist  Benefits related nurse triage line  Urgent care clinics  [employer] 24/7  When to call [place].     Importance and benefits of: Follow up with PCP and specialist, medication adherence, self monitoring and reporting of symptoms.      Plan:  Continue biweekly outreaches to provide telephonic support, education and resources as needed.   Discuss / follow up on: Goal progress and n/v/d, appetite, outcome of f/u appt with cardiology, outcome of appt with hematology oncology.     Pt verbalized understanding and is agreeable to follow up contact.       Offered patient enrollment in the Remote Patient Monitoring (RPM) program for in-home monitoring: NA.    Lab Results       None                 Goals Addressed                      This Visit's  Progress      Patient Stated (pt-stated)   No change      Goal:  Reports exacerbations of acute pain are controlled and she can return to prior activities and level of function;    Barriers:   Difficulty managing acute pain, unknown source;  Plan for overcoming my barriers:      Close follow up with RD, [place] RN, Physicians and ACM to work through barriers  Follow bowel and medication regime  Report all new symptoms or complications to physician    Confidence: 9/10  Anticipated Goal Completion Date: Ongoing              Prior to Admission medications    Medication Sig Start Date End Date Taking? Authorizing Provider   Prucalopride Succinate 2 MG TABS Take 2 mg by mouth daily.    Historical Provider, MD   promethazine (PHENERGAN) 12.5 MG tablet Take 1 tablet by mouth 4 times daily as needed for Nausea 01/31/21 05/01/21  Fonnie Jarvis, MD   granisetron (SANCUSO) 3.1 MG/24HR [facility] Place 1 patch onto the skin every 7 days Do not take zofran when using sancuso 01/31/21   Lannie Fields, APRN - NP   guanFACINE (INTUNIV) 1 MG TB24 extended release tablet Take  1 mg by mouth daily.    Historical Provider, MD   OLANZapine (ZYPREXA) 2.5 MG tablet Take 5 mg by mouth nightly  Patient not taking: Reported on 02/14/2021    Historical Provider, MD   acetaminophen (TYLENOL) 500 MG tablet Take 500 mg by mouth every 6 hours as needed for Pain (breakthrough pain).    Historical Provider, MD   EPINEPHrine 0.3 MG/0.3ML SOSY Inject 0.3 mg into the muscle 1 (one) time if needed (allergic reaction).    Historical Provider, MD   ondansetron (ZOFRAN-ODT) 8 MG TBDP disintegrating tablet Place 1 tablet under the tongue every 8 hours as needed for Nausea or Vomiting 10/05/20   Azalia Bilis, MD   Sertraline HCl (ZOLOFT PO) Take 75 mg by mouth daily  Patient not taking: No sig reported    Historical Provider, MD   Famotidine (PEPCID PO) Take 10 mg by mouth daily    Historical Provider, MD   levonorgestrel (MIRENA, 52 MG,) IUD 52 mg 1 each by IntraUTERine  route once    Historical Provider, MD   VYVANSE 60 MG CHEW  09/12/20 01/31/21  Historical Provider, MD       Future Appointments   Date Time Provider [place]   02/25/2021  8:00 AM Lurene Shadow, MD [facility]   02/27/2021 To Be Determined April Rowe, RN [place]   03/07/2021  2:15 PM Abner Greenspan, MD [facility]

## 2021-02-25 ENCOUNTER — Ambulatory Visit: Admit: 2021-02-25 | Discharge: 2021-02-25 | Payer: BLUE CROSS/BLUE SHIELD | Attending: Hematology & Oncology

## 2021-02-25 DIAGNOSIS — D61818 Other pancytopenia: Secondary | ICD-10-CM

## 2021-02-25 MED ORDER — COPPER GLUCONATE 2 MG PO CAPS
2 MG | ORAL_CAPSULE | Freq: Every day | ORAL | 1 refills | Status: AC
Start: 2021-02-25 — End: 2022-12-26

## 2021-02-25 NOTE — Patient Instructions (Addendum)
Patient Instructions from Today's Visit    Reason for Visit:  New Patient    Diagnosis Information:  https://www.cancer.net/about-us/asco-answers-patient-education-materials/asco-answers-fact-sheets      Plan:      Follow Up:  We would like to recheck labs in about 6 weeks and then see you back in the office in about 8 weeks.    Recent Lab Results:  N/a    Treatment Summary has been discussed and given to patient: n/a        -------------------------------------------------------------------------------------------------------------------  Please call our office at [phone] if you have any  of the following symptoms:   Fever of 100.5 or greater  Chills  Shortness of breath  Swelling or pain in one leg    After office hours an answering service is available and will contact a provider for emergencies or if you are experiencing any of the above symptoms.    Patient does express an interest in My Chart.  My Chart log in information explained on the after visit summary printout at the [place] desk.    Margarette Asal, NCMA

## 2021-02-25 NOTE — Progress Notes (Addendum)
[facility] Hematology and Oncology: Established patient - follow up     Chief Complaint   Patient presents with    New Patient     Dx anemia/thrombocytopenia     History of Present Illness:  Ms. Kessinger is a 20 y.o. female who presents today for follow up regarding cytopenias.  Pt admitted on 01/26/2021 with a primary diagnosis of chronic N/V, electrolyte abnormalities and gastroparesis.  PMHx; highly functional autism, depression, chronic gastroparesis - fu at [facility].  She came in with N/V and abd pain.  She was started on abx for suspected sepsis.  She has undergone extensive workup with GI.  Completed EGD/colonoscopy.  [facility] [place].  Was on Zyprexa - recently at 5mg .  Her symptoms started in Jan with decreased intake due to nausea.  LFTs better.  We were asked for recs re anemia and thrombocytopenia.  MR brain recommended to complete workup.      Today, she is here for follow-up with her father.  Mom was present for the visit via telephone as is out of town.  MRI of the brain reviewed and no abnormalities were noted, family is pleased.  We also reviewed the lab results which showed mild copper deficiency with levels at 65.  We discussed the reasons for copper deficiency could be CF, celiac, IBD, bacterial overgrowth and prolonged PPI use.  We can start repletion at 2 mg/day and see how she responds in a few weeks.  She continues to follow-up with GI at [facility] as well as [facility].  Nausea continues.  Sancuso patch did not work.  She is back on oral Zofran about 3 times a day and Compazine.  She has tried Zyprexa too without relief, also taking Ativan.      Chronological Events:  05/2020 s/s nausea started   01/2021 admitted with n/v/sepsis   02/25/21 HFU - MRI brain under sedation NED; Cu++ mild defic - will replete PO     Family History   Problem Relation Age of Onset    Cancer Mother         bilateral breast cancer    No Known Problems Father     Hypertension Mother       Social History      Socioeconomic History    Marital status: Single     Spouse name: None    Number of children: None    Years of education: None    Highest education level: None   Tobacco Use    Smoking status: Never    Smokeless tobacco: Never   Substance and Sexual Activity    Alcohol use: Not Currently    Drug use: Never        Review of Systems   Constitutional:  Positive for fatigue. Negative for appetite change, chills, diaphoresis, fever and unexpected weight change.   HENT:   Negative for hearing loss, mouth sores, nosebleeds, sore throat, trouble swallowing and voice change.    Eyes:  Negative for icterus.   Respiratory:  Negative for chest tightness, hemoptysis, shortness of breath and wheezing.    Cardiovascular:  Negative for chest pain, leg swelling and palpitations.   Gastrointestinal:  Positive for abdominal pain and nausea. Negative for abdominal distention, blood in stool, constipation, diarrhea and vomiting.   Endocrine: Negative for hot flashes.   Genitourinary:  Negative for difficulty urinating, frequency, vaginal bleeding and vaginal discharge.    Musculoskeletal:  Negative for arthralgias, flank pain, gait problem and myalgias.  Skin:  Negative for itching, rash and wound.   Neurological:  Negative for dizziness, extremity weakness, gait problem, headaches and numbness.   Psychiatric/Behavioral:  Positive for depression and sleep disturbance. Negative for confusion. The patient is not nervous/anxious.       Allergies   Allergen Reactions    Cow's Milk Anaphylaxis and Hives    Food Anaphylaxis    Hydrocodone-Acetaminophen Hives    Lac Bovis Anaphylaxis     Tolerates acetaminophen    Milk Protein Anaphylaxis and Hives     Milk Protein    Cat Hair Extract Itching    Dog Epithelium Allergy Skin Test Itching    Other Itching     Past Medical History:   Diagnosis Date    ADHD     managed with meds    Autism     Depression     GERD (gastroesophageal reflux disease)     Weight loss      Past Surgical History:    Procedure Laterality Date    CHOLECYSTECTOMY, LAPAROSCOPIC N/A 10/05/2020    CHOLECYSTECTOMY LAPAROSCOPIC performed by Azalia Bilis, MD at [facility] MAIN OR    COLONOSCOPY N/A 09/17/2020    COLONOSCOPY/BMI 21 performed by Flint Melter, MD at [facility] ENDOSCOPY    COLONOSCOPY N/A 01/14/2019    COLONOSCOPY/  BMI 21 PT IS AUTISTIC performed by Flint Melter, MD at [place] Bilateral     as a baby    LAPAROSCOPIC APPENDECTOMY N/A 10/05/2020    APPENDECTOMY LAPAROSCOPIC performed by Azalia Bilis, MD at [facility] MAIN OR    UPPER GASTROINTESTINAL ENDOSCOPY       Current Outpatient Medications   Medication Sig Dispense Refill    vitamin D (ERGOCALCIFEROL) 1.25 MG (50000 UT) CAPS capsule       docusate (COLACE, DULCOLAX) 100 MG CAPS       famotidine (PEPCID) 20 MG tablet TAKE ONE TABLET BY MOUTH ONE TIME DAILY      ibuprofen (ADVIL;MOTRIN) 600 MG tablet Take 600 mg by mouth every 6 hours as needed      promethazine (PHENERGAN) 12.5 MG tablet Take 1 tablet by mouth 4 times daily as needed for Nausea 100 tablet 0    guanFACINE (INTUNIV) 1 MG TB24 extended release tablet Take 1 mg by mouth daily.      acetaminophen (TYLENOL) 500 MG tablet Take 500 mg by mouth every 6 hours as needed for Pain (breakthrough pain).      Sertraline HCl (ZOLOFT PO) Take 75 mg by mouth daily      Famotidine (PEPCID PO) Take 10 mg by mouth daily      levonorgestrel (MIRENA, 52 MG,) IUD 52 mg 1 each by IntraUTERine route once      Prucalopride Succinate 2 MG TABS Take 2 mg by mouth daily. (Patient not taking: Reported on 02/25/2021)      granisetron (SANCUSO) 3.1 MG/24HR [facility] Place 1 patch onto the skin every 7 days Do not take zofran when using sancuso (Patient not taking: Reported on 02/25/2021) 4 patch 1    OLANZapine (ZYPREXA) 2.5 MG tablet Take 5 mg by mouth nightly (Patient not taking: No sig reported)      EPINEPHrine 0.3 MG/0.3ML SOSY Inject 0.3 mg into the muscle 1 (one) time if needed (allergic reaction). (Patient  not taking: Reported on 02/25/2021)      ondansetron (ZOFRAN-ODT) 8 MG TBDP disintegrating tablet Place 1 tablet under the tongue every 8  hours as needed for Nausea or Vomiting 10 tablet 0     No current facility-administered medications for this visit.       No flowsheet data found.    OBJECTIVE:  BP 121/72 (Site: Right Upper Arm, Position: Standing)    Pulse (!) 110    Temp 98 ??F (36.7 ??C)    Resp 14    Ht 5\' 5"  (1.651 m) Comment: taken w/o shoes   Wt 111 lb 1.6 oz (50.4 kg)    SpO2 100%    BMI 18.49 kg/m??       ECOG PERFORMANCE STATUS - 0-Fully active, able to carry on all pre-disease performance without restriction.    Pain - 4/10. Mild to moderate pain, requiring medication - see MAR     Fatigue - No flowsheet data found.  Distress - No flowsheet data found.          Physical Exam  Vitals reviewed.   Constitutional:       General: She is not in acute distress.     Appearance: Normal appearance. She is not ill-appearing or toxic-appearing.   HENT:      Head: Normocephalic and atraumatic.      Nose: Nose normal.      Mouth/Throat:      Mouth: Mucous membranes are moist.   Eyes:      General: No scleral icterus.     Extraocular Movements: Extraocular movements intact.      Conjunctiva/sclera: Conjunctivae normal.      Pupils: Pupils are equal, round, and reactive to light.   Cardiovascular:      Rate and Rhythm: Normal rate and regular rhythm.      Heart sounds: No murmur heard.  Pulmonary:      Effort: Pulmonary effort is normal. No respiratory distress.      Breath sounds: Normal breath sounds. No wheezing or rales.   Abdominal:      General: There is no distension.      Palpations: Abdomen is soft. There is no mass.      Tenderness: There is abdominal tenderness (on percussion in RUQ). There is no guarding or rebound.   Musculoskeletal:         General: Normal range of motion.      Cervical back: Normal range of motion.      Right lower leg: No edema.      Left lower leg: No edema.   Lymphadenopathy:       Cervical: No cervical adenopathy.      Upper Body:      Right upper body: No supraclavicular or axillary adenopathy.      Left upper body: No supraclavicular or axillary adenopathy.   Skin:     General: Skin is warm and dry.      Coloration: Skin is not jaundiced or pale.      Findings: No rash.   Neurological:      General: No focal deficit present.      Mental Status: She is alert and oriented to person, place, and time.      Gait: Gait normal.   Psychiatric:         Behavior: Behavior normal.         Thought Content: Thought content normal.        Labs:  No results found for this or any previous visit (from the past 168 hour(s)).        Imaging: reviewed  ASSESSMENT:     Diagnosis Orders   1. Pancytopenia (HCC)  CBC with Auto Differential    Comprehensive Metabolic Panel    Copper, Serum    Ferritin    Transferrin Saturation    Vitamin B12    Folate    Vitamin D 25 Hydroxy          Ms Giovannini is here for FU.      Anemia/thrombocytopenia - improving off abx; s/p recent infection; no s/s of hemolysis, smear reviewed   - at last visit, we discussed the pathophysiology of hematopoesis in general and etiologies.    - b12 adequate, zinc nl, TSAT 39%, vit C adequate   - vit D defic - rec repletion   - Cu ++ mild low - will replete 2mg /day; can be a result of TPN, CF/celiac, bacterial overgrowth in GI tract, IBD, PPI use.  Scopes neg for IBD.  Reviewed with Dr Ananias Pilgrim.    - plan to repeat nutritional studies at next FU     2. Nausea/abd pains - chronic since ~05/2020   - trial of Sancuso patch did not work; she's back on Zofran PO ~3/day and Compazine; also off Zyprexa as did not work   - to complete workup, imaging of brain/MR completed and NED.   - touched base with Dr Ananias Pilgrim to review case - will be seeing her soon and plan to review workup with pt/family.  She also has an apt at [facility] coming up  - consider repeat workup for porphyria - spot urine for PBG, porphyrins and Cr; can also add plasma and fecal  porphyrins, spot Urine ALA.  Sent note to Dr Wynetta Emery.       Case discussed with Dr. Wynetta Emery, pt's mother via tel during visit, and father in person.      RESUSCITATION DIRECTIVES/HOSPICE CARE: Full Support    RTC 6wks for repeat labs and 8wks FU or sooner as needed     MDM  Number of Diagnoses or Management Options  Pancytopenia ([place]): established, improving     Amount and/or Complexity of Data Reviewed  Clinical lab tests: ordered and reviewed  Tests in the medicine section of CPT??: ordered  Obtain history from someone other than the patient: yes Educational psychologist )  Review and summarize past medical records: yes  Independent visualization of images, tracings, or specimens: yes    Risk of Complications, Morbidity, and/or Mortality  Presenting problems: moderate  Diagnostic procedures: moderate  Management options: moderate        Lab studies and imaging studies were personally reviewed.  Pertinent old records were reviewed.     All questions were asked and answered to the best of my ability.  The patient verbalized understanding and agrees with the plan above.          Fairchance Di Kindle) Binnie Rail, [facility] [place] [address]  Office : [phone]  Fax : [phone]

## 2021-02-26 ENCOUNTER — Encounter

## 2021-02-26 LAB — MISCELLANEOUS SENDOUT

## 2021-03-01 ENCOUNTER — Encounter: Admit: 2021-03-01 | Discharge: 2021-03-01 | Payer: BLUE CROSS/BLUE SHIELD

## 2021-03-05 ENCOUNTER — Encounter

## 2021-03-07 ENCOUNTER — Ambulatory Visit: Admit: 2021-03-07 | Discharge: 2021-03-27 | Payer: BLUE CROSS/BLUE SHIELD

## 2021-03-07 ENCOUNTER — Ambulatory Visit: Admit: 2021-03-07 | Discharge: 2021-03-07 | Payer: BLUE CROSS/BLUE SHIELD | Attending: Internal Medicine

## 2021-03-07 DIAGNOSIS — Z7689 Persons encountering health services in other specified circumstances: Secondary | ICD-10-CM

## 2021-03-07 DIAGNOSIS — I499 Cardiac arrhythmia, unspecified: Secondary | ICD-10-CM

## 2021-03-07 NOTE — Progress Notes (Signed)
[address], [address]  [place] [address]  PHONE: [phone]    SUBJECTIVE:   Robin Mendoza is a 20 y.o. female 2000/10/28   seen for a consultation visit regarding the following:     Chief Complaint   Patient presents with    New Patient     Irregular heart beat    Irregular Heart Beat              Consultation is requested Cleon Gustin., MD, MD  for evaluation of New Patient (Irregular heart beat) and Irregular Heart Beat   .    History of present illness: 20 y.o. female past medical history of functional autism depression chronic gastroparesis.  Patient has had a comprehensive gastrointestinal work-up at [facility] as well as [facility] system.  Patient has had ongoing abdominal pain nausea.  Additional past medical history of sepsis.  At the time of original appointment the patient was accompanied by her mother who is an OB/GYN physician in the [place] system.      The patient is a G40, P0 female currently works as a Air traffic controller.  The patient has a fit bit monitor and has meticulously monitor heart rate over.  She as well as others have noticed labile heart rates during her hospitalizations and on her device and ranging from the 40 to 50 bpm range with heart rates in excess of 170 bpm the patient denies syncope presyncope dizziness.    Cardiac History:  01/2016 pediatric echocardiogram levocardia with vesical atrial situs solstice intraventricular concordance normal related great arteries normal left ventricular systolic function with no major valvular abnormalities.    Assessment:  Labile heart rates  Previous noted sinus bradycardia and sinus tachycardia.  We discussed potential etiologies of variation in her heart rates.  Possibly increased vagal tone due to her nausea gastroparesis and abdominal issues as well as tachycardic events which may be related to poor oral intake and deconditioning.  Additional possibilities for underlying arrhythmia or being evaluated as  well  Plan for cardiac monitor to further characterize potential arrhythmia burden  We discussed a wearable device as well for recording rhythms and heart rates in the future.  Due to her low resting heart rate IST less likely  She has had no syncope or postural symptoms suggesting autonomic dysfunction.    Hypotension  Low blood pressure readings detected today during physical exam  Likely multifactorial due to gastrointestinal issues poor oral intake and nausea as listed above.  As stated above autonomic dysfunction seems less likely but can be evaluated with tilt table study more comprehensive work-up in the future.    Past Medical History, Past Surgical History, Family history, Social History, and Medications were all reviewed with the patient today and updated as necessary.       Allergies   Allergen Reactions    Cow's Milk Anaphylaxis and Hives    Food Anaphylaxis    Hydrocodone-Acetaminophen Hives    Lac Bovis Anaphylaxis     Tolerates acetaminophen    Milk Protein Anaphylaxis and Hives     Milk Protein    Cat Hair Extract Itching    Dog Epithelium Allergy Skin Test Itching    Other Itching     Past Medical History:   Diagnosis Date    ADHD     managed with meds    Autism     Depression     GERD (gastroesophageal reflux disease)     Weight loss  Past Surgical History:   Procedure Laterality Date    CHOLECYSTECTOMY, LAPAROSCOPIC N/A 10/05/2020    CHOLECYSTECTOMY LAPAROSCOPIC performed by Azalia Bilis, MD at [facility] MAIN OR    COLONOSCOPY N/A 09/17/2020    COLONOSCOPY/BMI 21 performed by Flint Melter, MD at [facility] ENDOSCOPY    COLONOSCOPY N/A 01/14/2019    COLONOSCOPY/  BMI 21 PT IS AUTISTIC performed by Flint Melter, MD at [facility] ENDOSCOPY    EAR SURGERY Bilateral     as a baby    LAPAROSCOPIC APPENDECTOMY N/A 10/05/2020    APPENDECTOMY LAPAROSCOPIC performed by Azalia Bilis, MD at [facility] MAIN OR    UPPER GASTROINTESTINAL ENDOSCOPY       Family History   Problem Relation Age of Onset    Cancer  Mother         bilateral breast cancer    No Known Problems Father     Hypertension Mother      Social History     Tobacco Use    Smoking status: Never    Smokeless tobacco: Never   Substance Use Topics    Alcohol use: Not Currently       ROS:    Review of Systems   Constitutional: Negative for fever.   HENT:  Negative for stridor.    Eyes:  Negative for pain.   Cardiovascular:  Negative for chest pain.   Respiratory:  Negative for cough.    Endocrine: Negative for cold intolerance.   Skin:  Negative for nail changes.   Musculoskeletal:  Negative for arthritis.   Gastrointestinal:  Negative for abdominal pain.   Genitourinary:  Negative for dysuria.   Neurological:  Negative for aphonia.   Psychiatric/Behavioral:  Negative for altered mental status.    Allergic/Immunologic: Negative for hives.        PHYSICAL EXAM:   Ht '5\' 5"'  (1.651 m)    Wt 113 lb 12.8 oz (51.6 kg)    BMI 18.94 kg/m??      Physical Exam  Vitals reviewed.   HENT:      Head: Normocephalic.      Right Ear: External ear normal.      Left Ear: External ear normal.      Nose: Nose normal.   Eyes:      General: No scleral icterus.  Pulmonary:      Effort: Pulmonary effort is normal.   Abdominal:      General: There is no distension.   Musculoskeletal:      Cervical back: Neck supple.   Skin:     General: Skin is warm.   Neurological:      Mental Status: She is alert. Mental status is at baseline.       Medical problems and test results were reviewed with the patient today.     No results found for any visits on 03/07/21.      Recent Results (from the past 672 hour(s))   Phosphorus    Collection Time: 02/11/21 12:26 PM   Result Value Ref Range    Phosphorus 2.5 2.5 - 4.5 MG/DL   Magnesium    Collection Time: 02/11/21 12:26 PM   Result Value Ref Range    Magnesium 2.0 1.8 - 2.4 mg/dL   Comprehensive Metabolic Panel    Collection Time: 02/11/21 12:26 PM   Result Value Ref Range    Sodium 141 136 - 145 mmol/L    Potassium 4.5 3.5 - 5.1 mmol/L    Chloride  110  101 - 110 mmol/L    CO2 24 21 - 32 mmol/L    Anion Gap 7 4 - 13 mmol/L    Glucose 94 65 - 100 mg/dL    BUN 15 6 - 23 MG/DL    Creatinine 0.80 0.6 - 1.0 MG/DL    Est, Glom Filt Rate >60 >60 ml/min/1.50m    Calcium 9.4 8.3 - 10.4 MG/DL    Total Bilirubin 0.4 0.2 - 1.1 MG/DL    ALT 46 12 - 65 U/L    AST 32 15 - 37 U/L    Alk Phosphatase 161 (H) 50 - 136 U/L    Total Protein 7.0 6.3 - 8.2 g/dL    Albumin 3.6 3.5 - 5.0 g/dL    Globulin 3.4 2.3 - 3.5 g/dL    Albumin/Globulin Ratio 1.1 (L) 1.2 - 3.5     CBC with Auto Differential    Collection Time: 02/11/21 12:26 PM   Result Value Ref Range    WBC 5.6 4.3 - 11.1 K/uL    RBC 3.68 (L) 4.05 - 5.2 M/uL    Hemoglobin 10.6 (L) 11.7 - 15.4 g/dL    Hematocrit 34.7 (L) 35.8 - 46.3 %    MCV 94.3 79.6 - 97.8 FL    MCH 28.8 26.1 - 32.9 PG    MCHC 30.5 (L) 31.4 - 35.0 g/dL    RDW 13.0 11.9 - 14.6 %    Platelets 575 (H) 150 - 450 K/uL    MPV 10.9 9.4 - 12.3 FL    nRBC 0.00 0.0 - 0.2 K/uL    Differential Type AUTOMATED      Seg Neutrophils 37 (L) 43 - 78 %    Lymphocytes 50 (H) 13 - 44 %    Monocytes 10 4.0 - 12.0 %    Eosinophils % 1 0.5 - 7.8 %    Basophils 2 0.0 - 2.0 %    Immature Granulocytes 0 0.0 - 5.0 %    Segs Absolute 2.1 1.7 - 8.2 K/UL    Absolute Lymph # 2.7 0.5 - 4.6 K/UL    Absolute Mono # 0.6 0.1 - 1.3 K/UL    Absolute Eos # 0.1 0.0 - 0.8 K/UL    Basophils Absolute 0.1 0.0 - 0.2 K/UL    Absolute Immature Granulocyte 0.0 0.0 - 0.5 K/UL   Vitamin D 25 Hydroxy    Collection Time: 02/11/21 12:39 PM   Result Value Ref Range    Vit D, 25-Hydroxy 27.3 (L) 30.0 - 100.0 ng/mL   Protime-INR    Collection Time: 02/11/21 12:39 PM   Result Value Ref Range    Protime 14.0 12.6 - 14.5 sec    INR 1.0     APTT    Collection Time: 02/11/21 12:39 PM   Result Value Ref Range    PTT 29.4 24.1 - 35.1 SEC   Culture, Urine    Collection Time: 02/11/21  1:08 PM    Specimen: Urine, clean catch   Result Value Ref Range    Special Requests NO SPECIAL REQUESTS      Culture <10,000 COLONIES/mL NORMAL  SKIN FLORA ISOLATED       Lab Results   Component Value Date/Time    CHOL 121 10/12/2020 12:08 PM    HDL 28 10/12/2020 12:08 PM       No results found for any visits on 03/07/21.      PLAN    IZanaewas seen today for  new patient and irregular heart beat.    Diagnoses and all orders for this visit:    Establishing care with new doctor, encounter for  -     EKG 12 Lead    Irregular heart beat  -     EKG 12 Lead  -     Cancel: Cardiac holter monitor (<= 48 hours); Future  -     Extended cardiac holter monitor (48 hrs - 15 days); Future    Return in about 3 weeks (around 03/28/2021).       Thank you for allowing me to participate in this patient's care.  Please call or contact me if there are any questions or concerns regarding the above.      Abner Greenspan, MD  03/07/21  2:02 PM      Proofread, but unrecognized errors may exist.

## 2021-03-11 ENCOUNTER — Encounter

## 2021-03-11 LAB — PERIPHERAL BLOOD SMEAR, MD REVIEW

## 2021-03-12 LAB — COMPREHENSIVE METABOLIC PANEL
ALT: 30 U/L (ref 12–65)
AST: 33 U/L (ref 15–37)
Albumin/Globulin Ratio: 1.3 (ref 0.4–1.6)
Albumin: 3.9 g/dL (ref 3.5–5.0)
Alk Phosphatase: 91 U/L (ref 50–136)
Anion Gap: 7 mmol/L (ref 2–11)
BUN: 7 MG/DL (ref 6–23)
CO2: 25 mmol/L (ref 21–32)
Calcium: 9.6 MG/DL (ref 8.3–10.4)
Chloride: 108 mmol/L (ref 101–110)
Creatinine: 0.6 MG/DL (ref 0.6–1.0)
Est, Glom Filt Rate: >60 mL/min/{1.73_m2} (ref >60)
Globulin: 3.1 g/dL (ref 2.8–4.5)
Glucose: 72 mg/dL (ref 65–100)
Potassium: 4.2 mmol/L (ref 3.5–5.1)
Sodium: 140 mmol/L (ref 133–143)
Total Bilirubin: 0.2 MG/DL (ref 0.2–1.1)
Total Protein: 7 g/dL (ref 6.3–8.2)

## 2021-03-12 LAB — MAGNESIUM: Magnesium: 2 mg/dL (ref 1.8–2.4)

## 2021-03-12 NOTE — Care Coordination-Inpatient (Signed)
Ambulatory Care Coordination Note  03/12/2021    ACC: Hyman Hopes, RN    Ambulatory Care Manager [facility]) contacted the patient by telephone to follow up on progress, discuss new issues or concerns, and reinforce/ provide patient education. Verified name and DOB with patient as identifiers.    Patient states she continues to have abdominal pain and nausea. Patient is takiing oral zofran 3 times per day and compazine. Sancuso patch did not work per office notes. Patient states she hasn't vomited in quite a while now. Patient states she is going to start a program next week that will help her with eating. States what she is doing at home is not working. Patient states she is not sure of the exact date that this starts. Her mother makes the appts. She states if I needed to speak with her regarding this I can call her mother.  Patient had cardiology appt and was given a cardiac monitor to wear. Patient states she takes that off today and returns it. Patient had EKG also. Patient will follow up with Dr. Jarrett Ables on 03/27/2021. States her heart rate is still the same and she is keeping track of this on her fitbit.  Patient had an appt with hematology oncology on 02/25/2021. Will follow up on 04/24/2021.    Reinforced/ Provided Education:  Discussed red flags and appropriate site of care based on symptoms and resources available to patient including: PCP  Specialist  Benefits related nurse triage line  Urgent care clinics  [facility] 24/7  When to call [place].     Importance and benefits of: Follow up with PCP and specialist, medication adherence, self monitoring and reporting of symptoms.      Plan:  Continue biweekly outreaches to provide telephonic support, education and resources as needed.   Discuss / follow up on: Goal progress and how patient is doing with program to help with her eating, outcome of f/u appt and results of cardiac monitor.     Pt verbalized understanding and is agreeable to follow  up contact.       Offered patient enrollment in the Remote Patient Monitoring (RPM) program for in-home monitoring: NA.    Lab Results       None                 Goals Addressed                      This Visit's Progress      Patient Stated (pt-stated)   No change      Goal:  Reports exacerbations of acute pain are controlled and she can return to prior activities and level of function;    Barriers:   Difficulty managing acute pain, unknown source;  Plan for overcoming my barriers:      Close follow up with RD, [place] RN, Physicians and ACM to work through barriers  Follow bowel and medication regime  Report all new symptoms or complications to physician    Confidence: 9/10  Anticipated Goal Completion Date: Ongoing              Prior to Admission medications    Medication Sig Start Date End Date Taking? Authorizing Provider   ondansetron (ZOFRAN) 4 MG tablet  01/12/21   Historical Provider, MD   LORazepam (ATIVAN) 2 MG tablet Take 2 mg by mouth every 6 hours as needed. 02/14/21   Historical Provider, MD   vitamin D (ERGOCALCIFEROL)  1.25 MG (50000 UT) CAPS capsule Take 50,000 Units by mouth once a week 02/13/21   Historical Provider, MD   docusate (COLACE, DULCOLAX) 100 MG CAPS  12/25/20   Historical Provider, MD   famotidine (PEPCID) 20 MG tablet TAKE ONE TABLET BY MOUTH ONE TIME DAILY 02/19/21   Historical Provider, MD   ibuprofen (ADVIL;MOTRIN) 600 MG tablet Take 600 mg by mouth every 6 hours as needed    Historical Provider, MD   Copper Gluconate 2 MG CAPS Take 2 mg by mouth daily 02/25/21   Lurene Shadow, MD   Prucalopride Succinate 2 MG TABS Take 2 mg by mouth daily.    Historical Provider, MD   promethazine (PHENERGAN) 12.5 MG tablet Take 1 tablet by mouth 4 times daily as needed for Nausea 01/31/21 05/01/21  Fonnie Jarvis, MD   guanFACINE (INTUNIV) 1 MG TB24 extended release tablet Take 1 mg by mouth daily.    Historical Provider, MD   acetaminophen (TYLENOL) 500 MG tablet Take 500 mg by mouth every 6 hours as needed  for Pain (breakthrough pain).    Historical Provider, MD   EPINEPHrine 0.3 MG/0.3ML SOSY Inject 0.3 mg into the muscle 1 (one) time if needed (allergic reaction)    Historical Provider, MD   Sertraline HCl (ZOLOFT PO) Take 75 mg by mouth daily    Historical Provider, MD   levonorgestrel (MIRENA, 52 MG,) IUD 52 mg 1 each by IntraUTERine route once    Historical Provider, MD   VYVANSE 60 MG CHEW  09/12/20 01/31/21  Historical Provider, MD       Future Appointments   Date Time Provider [place]   03/27/2021  2:15 PM Abner Greenspan, MD [facility]   04/08/2021  8:40 AM [facility]   04/24/2021  8:00 AM Lurene Shadow, MD [facility]

## 2021-03-25 LAB — EXTENDED CARDIAC HOLTER MONITOR: Body Surface Area: 1.54 m2

## 2021-03-25 NOTE — Telephone Encounter (Signed)
Mom notified of results of monitor//brendab

## 2021-03-25 NOTE — Telephone Encounter (Signed)
No significant arrhythmia identified at this time

## 2021-03-25 NOTE — Telephone Encounter (Signed)
Wants to get the results of her daughters halter monitor. Robin Mendoza is in the hospital. Please call

## 2021-03-27 ENCOUNTER — Encounter: Payer: BLUE CROSS/BLUE SHIELD | Attending: Internal Medicine

## 2021-03-28 NOTE — Care Coordination-Inpatient (Signed)
Ambulatory Care Coordination Note  03/28/2021    ACC: Hyman Hopes, RN    Ambulatory Care Manager [facility]) contacted the patient by telephone to follow up on progress, discuss new issues or concerns, and reinforce/ provide patient education. Verified name and DOB with patient as identifiers.    I spoke with patient's mom, [name]. Patient is currently inpatient for an eating program for her eating disorder. Patient has been there 1 week. Mom states she is expected to be there months but this could change. Mom states she was able to get heart monitor results from cardiologist. Mom states results were not concerning. Mom states she is putting all of her follow up appointments on hold at this time since she needs the inpatient treatment for her eating disorder.     Reinforced/ Provided Education:  Discussed red flags and appropriate site of care based on symptoms and resources available to patient including: PCP  Specialist  Benefits related nurse triage line  Urgent care clinics  [facility] 24/7  When to call [place].     Importance and benefits of: Follow up with PCP and specialist, medication adherence, self monitoring and reporting of symptoms.      Plan:  Continue monthly outreaches to provide telephonic support, education and resources as needed.   Discuss / follow up on: Goal progress and status of how patient is doing at inpatient eating program.     Pt verbalized understanding and is agreeable to follow up contact.       Offered patient enrollment in the Remote Patient Monitoring (RPM) program for in-home monitoring: NA.    Lab Results       None                 Goals Addressed    None         Prior to Admission medications    Medication Sig Start Date End Date Taking? Authorizing Provider   ondansetron (ZOFRAN) 4 MG tablet  01/12/21   Historical Provider, MD   LORazepam (ATIVAN) 2 MG tablet Take 2 mg by mouth every 6 hours as needed. 02/14/21   Historical Provider, MD   vitamin D  (ERGOCALCIFEROL) 1.25 MG (50000 UT) CAPS capsule Take 50,000 Units by mouth once a week 02/13/21   Historical Provider, MD   docusate (COLACE, DULCOLAX) 100 MG CAPS  12/25/20   Historical Provider, MD   famotidine (PEPCID) 20 MG tablet TAKE ONE TABLET BY MOUTH ONE TIME DAILY 02/19/21   Historical Provider, MD   ibuprofen (ADVIL;MOTRIN) 600 MG tablet Take 600 mg by mouth every 6 hours as needed    Historical Provider, MD   Copper Gluconate 2 MG CAPS Take 2 mg by mouth daily 02/25/21   Lurene Shadow, MD   Prucalopride Succinate 2 MG TABS Take 2 mg by mouth daily.    Historical Provider, MD   promethazine (PHENERGAN) 12.5 MG tablet Take 1 tablet by mouth 4 times daily as needed for Nausea 01/31/21 05/01/21  Fonnie Jarvis, MD   guanFACINE (INTUNIV) 1 MG TB24 extended release tablet Take 1 mg by mouth daily.    Historical Provider, MD   acetaminophen (TYLENOL) 500 MG tablet Take 500 mg by mouth every 6 hours as needed for Pain (breakthrough pain).    Historical Provider, MD   EPINEPHrine 0.3 MG/0.3ML SOSY Inject 0.3 mg into the muscle 1 (one) time if needed (allergic reaction)    Historical Provider, MD   Sertraline HCl (ZOLOFT PO)  Take 75 mg by mouth daily    Historical Provider, MD   levonorgestrel (MIRENA, 52 MG,) IUD 52 mg 1 each by IntraUTERine route once    Historical Provider, MD   VYVANSE 60 MG CHEW  09/12/20 01/31/21  Historical Provider, MD       Future Appointments   Date Time Provider [place]   04/08/2021  8:40 AM [facility]   04/24/2021  8:00 AM Lurene Shadow, MD [facility]

## 2021-04-02 MED FILL — LANSOPRAZOLE 30MG CPDR: 30 mg | 90 days supply | Qty: 180 | Fill #3 | Status: AC

## 2021-04-02 MED FILL — SERTRALINE HCL 50MG TABS: 50 mg | 90 days supply | Qty: 135 | Fill #3 | Status: AC

## 2021-04-02 MED FILL — GUANFACINE HCL ER 1MG TB24: 1 mg | 90 days supply | Qty: 90 | Fill #3 | Status: AC

## 2021-04-11 NOTE — Telephone Encounter (Signed)
-----   Message from Vania Rea, RN sent at 04/10/2021 11:53 AM EST -----  She didn't come to lab appt on 11/28.  Can you please reach out to her to get it rescheduled as soon as she can?  So Binnie Rail will have results back by her follow up on 12/14.  Thanks!

## 2021-04-11 NOTE — Telephone Encounter (Signed)
2nd attempt to r/s pt for labs

## 2021-04-17 MED FILL — XOLAIR 150MG/ML SOSY (SRX): 150 mg/mL | SUBCUTANEOUS | 28 days supply | Qty: 2 | Fill #0 | Status: AC

## 2021-04-24 ENCOUNTER — Other Ambulatory Visit: Payer: BLUE CROSS/BLUE SHIELD

## 2021-04-24 ENCOUNTER — Encounter: Payer: BLUE CROSS/BLUE SHIELD | Attending: Hematology & Oncology

## 2021-05-03 MED ORDER — LANSOPRAZOLE 30 MG PO CPDR
30 MG | ORAL_CAPSULE | ORAL | 3 refills | Status: AC
Start: 2021-05-03 — End: 2022-12-26
  Filled 2021-07-02: qty 180, 90d supply, fill #0

## 2021-05-15 MED FILL — XOLAIR 150MG/ML SOSY (SRX): 150 mg/mL | SUBCUTANEOUS | 28 days supply | Qty: 2 | Fill #1 | Status: AC

## 2021-05-15 NOTE — Care Coordination-Inpatient (Signed)
ACM attempted to reach patient for follow up call. HIPAA compliant message left requesting a return phone call at patients convenience. Will continue to follow.

## 2021-05-21 MED FILL — GUANFACINE HCL ER 4MG TB24: 4 mg | 90 days supply | Qty: 90 | Fill #0 | Status: AC

## 2021-06-11 MED FILL — XOLAIR 150MG/ML SOSY (SRX): 150 mg/mL | SUBCUTANEOUS | 28 days supply | Qty: 2 | Fill #2 | Status: AC

## 2021-06-14 NOTE — Care Coordination-Inpatient (Signed)
ACM attempted to reach patient for follow up call regarding FTT. HIPAA compliant message left on mother's VM requesting a return phone call at their convenience. Will continue to follow.      At last contact with patient/family on 03/28/21 patient was admitted to in patient program for eating disorder. ACM has attempted but unable to reach patient/family on previous calls. Plan to send letter, if no return call will resolve this episode.

## 2021-06-28 NOTE — Care Coordination-Inpatient (Signed)
Resolving current episode for case management due to patient lost to follow up. Patient has not been reached after repeated calls and letters. Final call made today to attempt to contact Letter sent to patient notifying completion of services due to unable to reach. This writer's contact information and information regarding program services included in materials sent.Goals updated to reflect current status as appropriate. Will remain available should patient request re-initiation of case management or transitions of care services.

## 2021-07-02 MED FILL — SERTRALINE HCL 50MG TABS: 50 mg | 90 days supply | Qty: 135 | Fill #0 | Status: AC

## 2021-07-10 MED FILL — XOLAIR 150MG/ML SOSY (SRX): 150 mg/mL | SUBCUTANEOUS | 28 days supply | Qty: 2 | Fill #3 | Status: AC

## 2021-08-12 MED FILL — XOLAIR 150MG/ML SOSY (SRX): 150 mg/mL | SUBCUTANEOUS | 28 days supply | Qty: 2 | Fill #4 | Status: AC

## 2021-08-14 LAB — CBC WITH AUTO DIFFERENTIAL
Absolute Eos #: 0.2 10*3/uL (ref 0.0–0.8)
Absolute Immature Granulocyte: 0 10*3/uL (ref 0.0–0.5)
Absolute Lymph #: 2.3 10*3/uL (ref 0.5–4.6)
Absolute Mono #: 0.5 10*3/uL (ref 0.1–1.3)
Basophils Absolute: 0.1 10*3/uL (ref 0.0–0.2)
Basophils: 1 % (ref 0.0–2.0)
Eosinophils %: 4 % (ref 0.5–7.8)
Hematocrit: 44.2 % (ref 35.8–46.3)
Hemoglobin: 14.3 g/dL (ref 11.7–15.4)
Immature Granulocytes: 0 % (ref 0.0–5.0)
Lymphocytes: 40 % (ref 13–44)
MCH: 28.7 PG (ref 26.1–32.9)
MCHC: 32.4 g/dL (ref 31.4–35.0)
MCV: 88.8 FL (ref 82–102)
MPV: 11 FL (ref 9.4–12.3)
Monocytes: 8 % (ref 4.0–12.0)
Platelets: 323 10*3/uL (ref 150–450)
RBC: 4.98 M/uL (ref 4.05–5.2)
RDW: 13.2 % (ref 11.9–14.6)
Seg Neutrophils: 47 % (ref 43–78)
Segs Absolute: 2.8 10*3/uL (ref 1.7–8.2)
WBC: 5.9 10*3/uL (ref 4.3–11.1)
nRBC: 0 10*3/uL (ref 0.0–0.2)

## 2021-08-14 LAB — COMPREHENSIVE METABOLIC PANEL
ALT: 21 U/L (ref 12–65)
AST: 17 U/L (ref 15–37)
Albumin/Globulin Ratio: 1.1 (ref 0.4–1.6)
Albumin: 3.9 g/dL (ref 3.5–5.0)
Alk Phosphatase: 131 U/L (ref 50–136)
Anion Gap: 3 mmol/L (ref 2–11)
BUN: 6 MG/DL (ref 6–23)
CO2: 29 mmol/L (ref 21–32)
Calcium: 9.8 MG/DL (ref 8.3–10.4)
Chloride: 108 mmol/L (ref 101–110)
Creatinine: 0.8 MG/DL (ref 0.6–1.0)
Est, Glom Filt Rate: >60 mL/min/{1.73_m2} (ref >60)
Globulin: 3.6 g/dL (ref 2.8–4.5)
Glucose: 93 mg/dL (ref 65–100)
Potassium: 3.9 mmol/L (ref 3.5–5.1)
Sodium: 140 mmol/L (ref 133–143)
Total Bilirubin: 0.4 MG/DL (ref 0.2–1.1)
Total Protein: 7.5 g/dL (ref 6.3–8.2)

## 2021-08-14 LAB — PHOSPHORUS: Phosphorus: 3.9 MG/DL (ref 2.5–4.5)

## 2021-08-14 LAB — MAGNESIUM: Magnesium: 2.1 mg/dL (ref 1.8–2.4)

## 2021-08-14 LAB — VITAMIN B12: Vitamin B-12: 790 pg/mL (ref 193–986)

## 2021-08-14 MED FILL — GUANFACINE HCL ER 4MG TB24: 4 mg | 90 days supply | Qty: 90 | Fill #1 | Status: AC

## 2021-08-17 LAB — VITAMIN D 25 HYDROXY: Vit D, 25-Hydroxy: 24.6 ng/mL — ABNORMAL LOW (ref 30.0–100.0)

## 2021-08-28 MED FILL — OMEPRAZOLE 40MG CPDR: 40 mg | ORAL | 90 days supply | Qty: 90 | Fill #0 | Status: AC

## 2021-09-03 MED FILL — MOTEGRITY 2MG TABS: 2 mg | ORAL | 90 days supply | Qty: 90 | Fill #0 | Status: AC

## 2021-09-09 MED FILL — XOLAIR 150MG/ML SOSY: 150 MG/ML | 28 days supply | Qty: 2 | Fill #5 | Status: AC

## 2021-09-09 MED FILL — XOLAIR 150MG/ML SOSY: 150 mg/mL | SUBCUTANEOUS | 28 days supply | Qty: 2 | Fill #5 | Status: AC

## 2021-09-25 MED FILL — SERTRALINE HCL 50MG TABS: 50 MG | 90 days supply | Qty: 135 | Fill #1 | Status: AC

## 2021-09-25 MED FILL — SERTRALINE HCL 50MG TABS: 50 mg | 90 days supply | Qty: 135 | Fill #1 | Status: AC

## 2021-10-09 MED FILL — XOLAIR 150MG/ML SOSY: 150 MG/ML | 28 days supply | Qty: 2 | Fill #6 | Status: AC

## 2021-10-09 MED FILL — XOLAIR 150MG/ML SOSY: 150 mg/mL | SUBCUTANEOUS | 28 days supply | Qty: 2 | Fill #6 | Status: AC

## 2021-11-06 MED FILL — XOLAIR 150MG/ML SOSY: 150 150 MG/ML | SUBCUTANEOUS | 28 days supply | Qty: 2 | Fill #0 | Status: AC

## 2021-11-14 MED FILL — GUANFACINE HCL ER 4MG TB24: 4 4 MG | 90 days supply | Qty: 90 | Fill #2 | Status: AC

## 2021-11-22 MED FILL — OMEPRAZOLE 40MG CPDR: 40 40 MG | ORAL | 90 days supply | Qty: 90 | Fill #1 | Status: AC

## 2021-12-03 MED FILL — MOTEGRITY 2MG TABS: 2 2 MG | ORAL | 90 days supply | Qty: 90 | Fill #1 | Status: AC

## 2021-12-18 MED FILL — XOLAIR 150MG/ML SOSY: 150 150 MG/ML | SUBCUTANEOUS | 28 days supply | Qty: 2 | Fill #0 | Status: AC

## 2022-01-16 IMAGING — MR MRI SHOULDER LT W/O CONTRAST
11 series · 40 of 40 positions shown · non-contrast
Comparison: none

﻿

MAGNETIC RESONANCE IMAGING OF THE LEFT SHOULDER WITHOUT INTRAVENOUS CONTRAST ADMINISTRATION
HISTORY: Left shoulder pain.  Claustrophobia.
TECHNIQUE: Multiplanar magnetic resonance imaging of the left shoulder joint was accomplished utilizing a surface coil and an open 0.3Jajinka Pesek scanner.  T1-weighted, intermediate intensity, and T2-weighted scanned sections obtained.

[Series 1: scano cor · coronal · left · 6.0mm · 0.94mm/px · 3 of 10 slices shown]
[im 1/10]
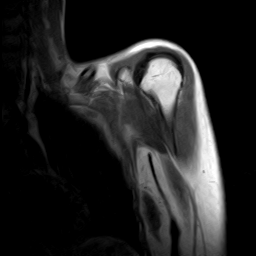
[im 5/10]
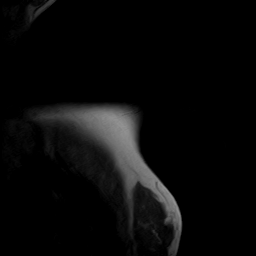
[im 10/10]
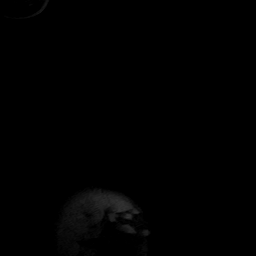

[Series 2: scano sag · sagittal · left · 4.0mm · 0.94mm/px · 1 of 7 slices shown]
[im 1/7]
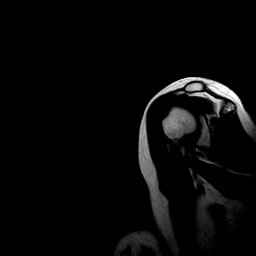

[Series 3: PD · axial · left · 4.0mm · 0.74mm/px · z∈[+2,+78]mm · 4 of 18 slices shown (1 of 2)]
[im 1/18]
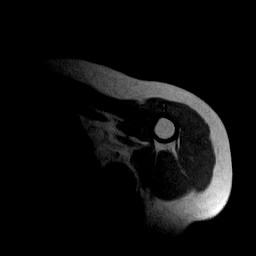
[im 6/18]
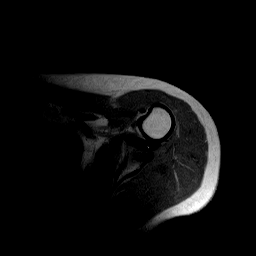
[im 12/18]
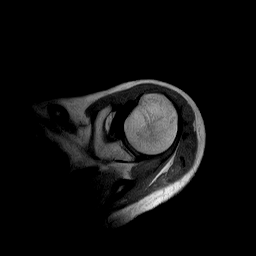
[im 18/18]
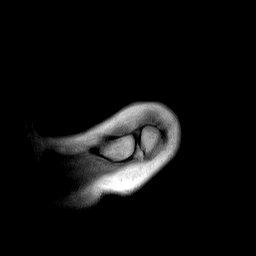

[Series 4: z ir cor · sagittal · left · 4.0mm · 0.74mm/px · 3 of 16 slices shown]
[im 1/16]
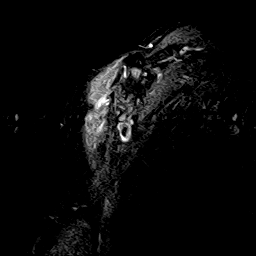
[im 8/16]
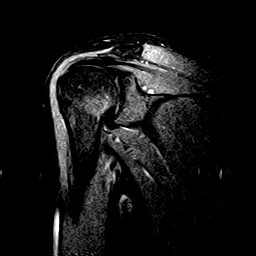
[im 16/16]
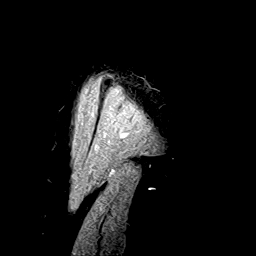

[Series 5: PD · sagittal · left · 4.0mm · 0.74mm/px · 7 of 32 slices shown (2 of 2)]
[im 1/32]
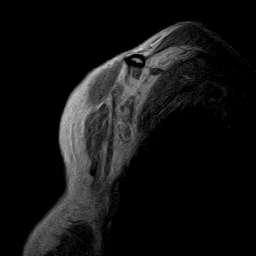
[im 6/32]
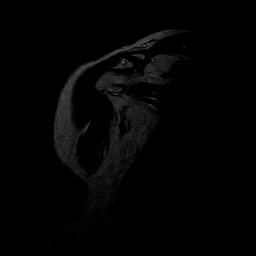
[im 11/32]
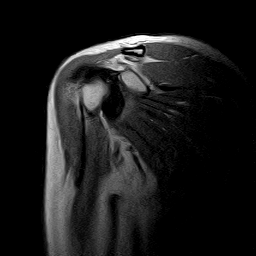
[im 16/32]
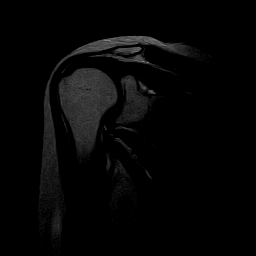
[im 21/32]
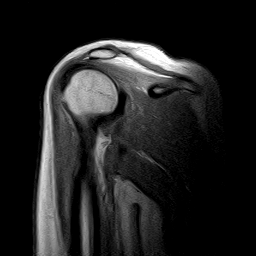
[im 26/32]
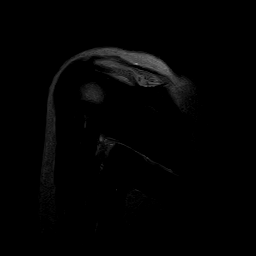
[im 32/32]
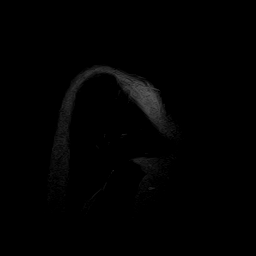

[Series 6: cor te 20 · sagittal · left · 4.0mm · 0.74mm/px · 3 of 16 slices shown]
[im 1/16]
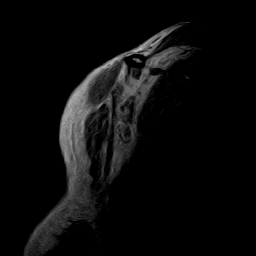
[im 8/16]
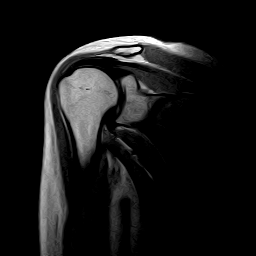
[im 16/16]
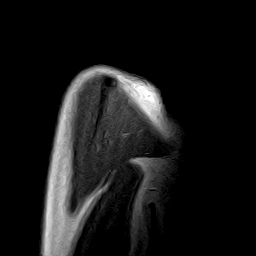

[Series 7: cor te 90 · sagittal · left · 4.0mm · 0.74mm/px · 3 of 16 slices shown]
[im 1/16]
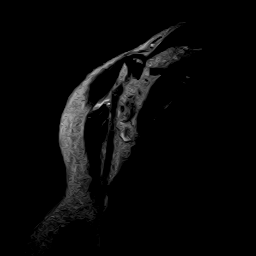
[im 8/16]
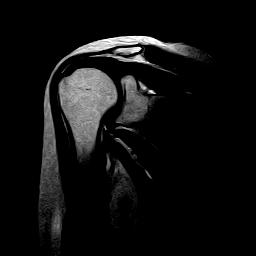
[im 16/16]
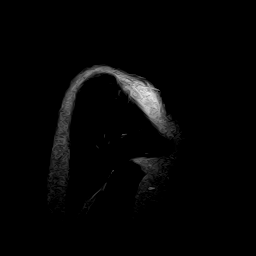

[Series 8: sag de · coronal · left · 4.0mm · 0.74mm/px · 7 of 32 slices shown]
[im 1/32]
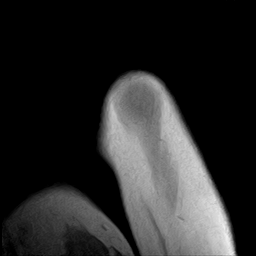
[im 6/32]
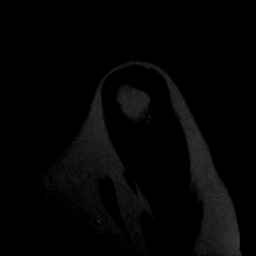
[im 11/32]
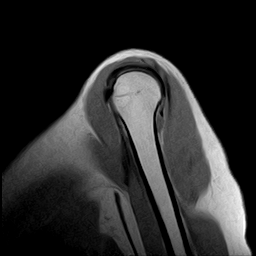
[im 16/32]
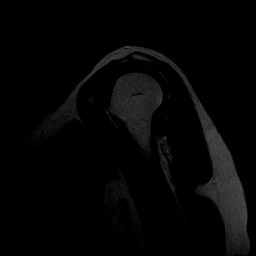
[im 21/32]
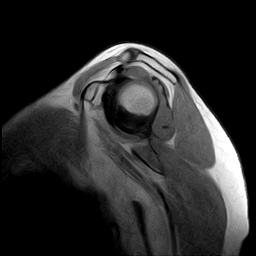
[im 26/32]
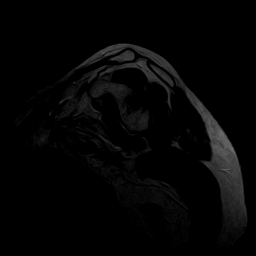
[im 32/32]
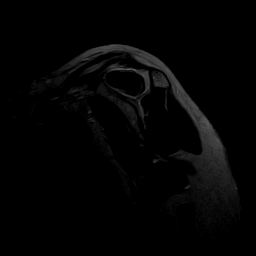

[Series 9: sag te 20 · coronal · left · 4.0mm · 0.74mm/px · 3 of 16 slices shown]
[im 1/16]
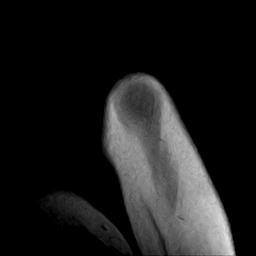
[im 8/16]
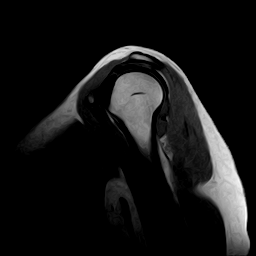
[im 16/16]
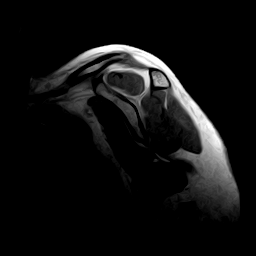

[Series 10: sag te 90 · coronal · left · 4.0mm · 0.74mm/px · 3 of 16 slices shown]
[im 1/16]
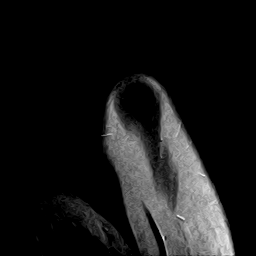
[im 8/16]
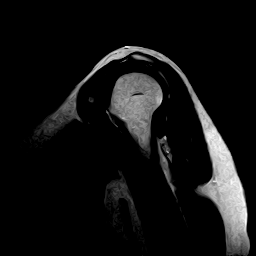
[im 16/16]
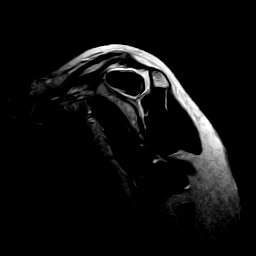

[Series 11: T1 · coronal · left · 4.0mm · 0.37mm/px · 3 of 16 slices shown]
[im 1/16]
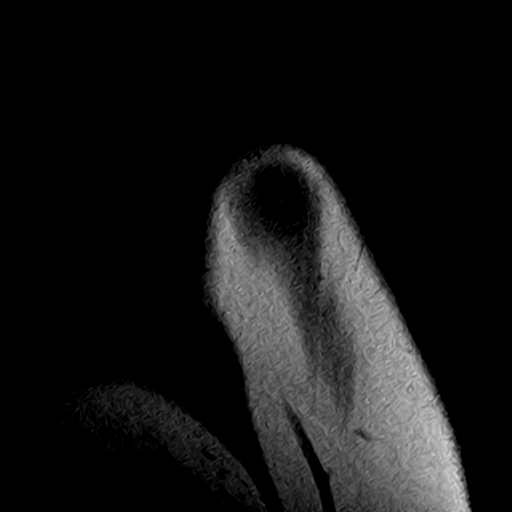
[im 8/16]
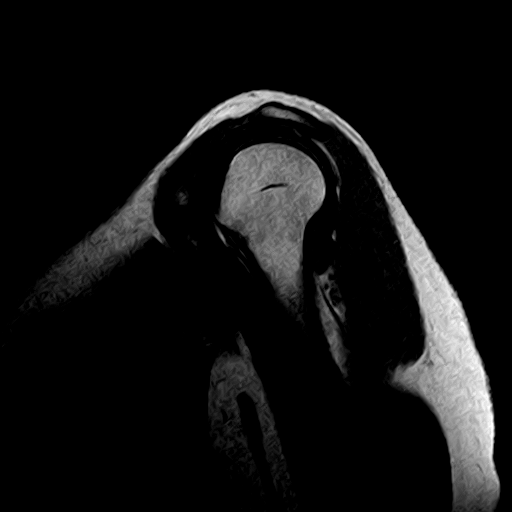
[im 16/16]
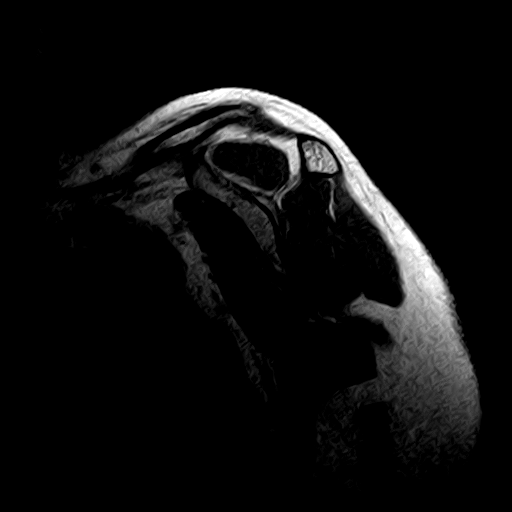

[40 of 40 positions shown; findings below may reference images not displayed]

FINDINGS: No prior studies are available for comparison purposes.  

No focal abnormalities of the acromioclavicular joint space observed.  There is no evidence for significant acromial outlet narrowing.  There is no evidence for a rotator cuff tear.  There are no subacromial or subdeltoid bursal effusions.  

No evidence for glenohumeral joint effusion.  Biceps tendon appears intact.  Small, ill-defined foci of patchy diminished T1 and increased T2 signal intensity present posterolateral humeral head near infraspinatus insertion, as well as the medial metaphysis posteriorly.  Lateral focus could be degenerative and medial focus could be physiologic alone though small regions of marrow edema or bone bruising not excluded.  There is no clear evidence for a displaced fracture.
IMPRESSION: 1. Small, patchy, indeterminate foci of proximal humeral bone marrow signal as described. 

2. No evidence for a rotator cuff tear, bursitis, or joint effusion.  

3. Please see in-depth discussion in body of report.

## 2022-01-16 IMAGING — MR MRI CERVICAL SPINE WITHOUT CONTRAST
7 of 8 series · 36 of 48 positions shown · non-contrast
Comparison: none

﻿

MAGNETIC RESONANCE IMAGING OF THE CERVICAL SPINE WITHOUT INTRAVENOUS CONTRAST ADMINISTRATION
HISTORY: Left neck and shoulder pain.  Claustrophobia.
TECHNIQUE: Multiplanar magnetic resonance imaging of the cervical spine was accomplished utilizing a surface coil and an open 0.3Yaw He Sine scanner without intravenous contrast administration.  T1 and T2-weighted thin slice sections were obtained.

[Series 1: scano s/c · axial · 5.0mm · 1.02mm/px · z∈[-8,+130]mm · 4 of 14 slices shown]
[im 1/14]
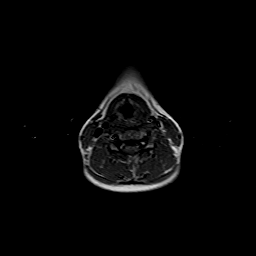
[im 5/14]
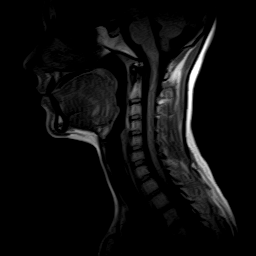
[im 9/14]
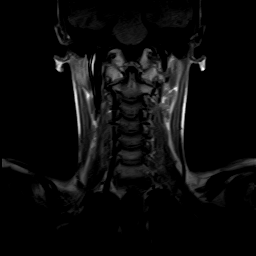
[im 14/14]
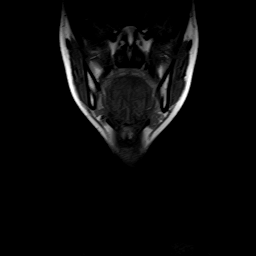

[Series 2: sag fir · sagittal · 3.0mm · 0.94mm/px · 5 of 15 slices shown]
[im 1/15]
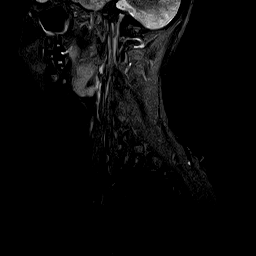
[im 4/15]
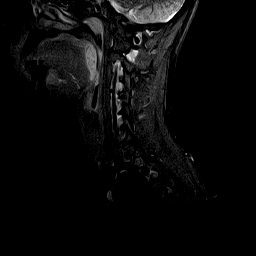
[im 8/15]
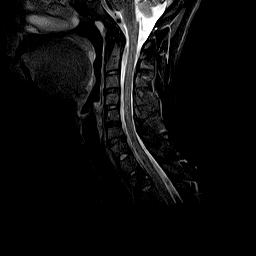
[im 11/15]
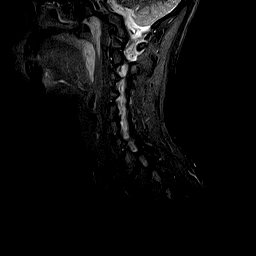
[im 15/15]
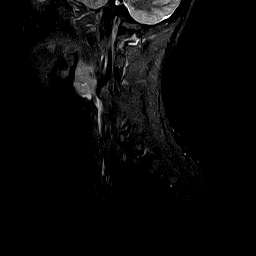

[Series 3: T1 · sagittal · 3.0mm · 0.94mm/px · 5 of 15 slices shown]
[im 1/15]
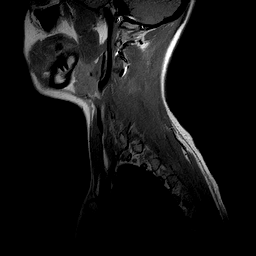
[im 4/15]
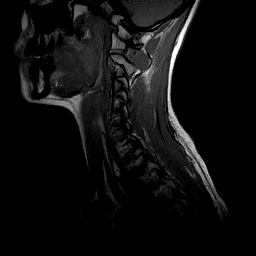
[im 8/15]
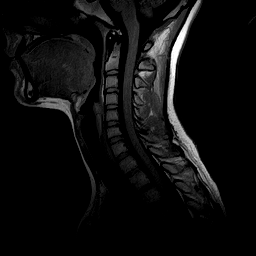
[im 11/15]
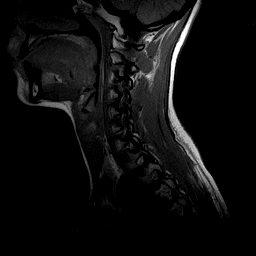
[im 15/15]
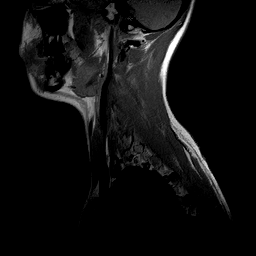

[Series 4: T2 · sagittal · 3.0mm · 0.94mm/px · 5 of 15 slices shown]
[im 1/15]
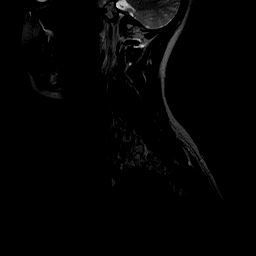
[im 4/15]
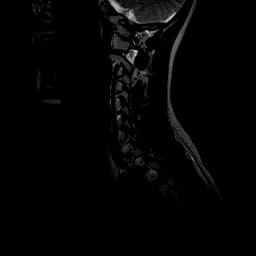
[im 8/15]
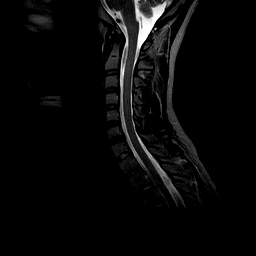
[im 11/15]
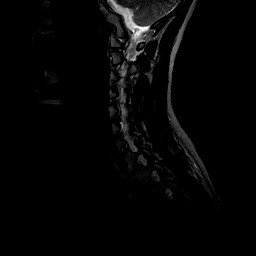
[im 15/15]
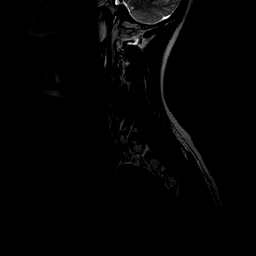

[Series 5: trs ge w/mtc · axial · 3.0mm · 1.02mm/px · z∈[-86,+12]mm · 8 of 26 slices shown]
[im 1/26]
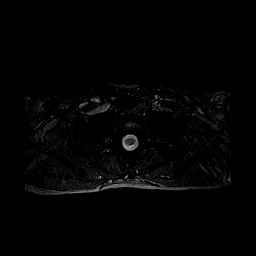
[im 4/26]
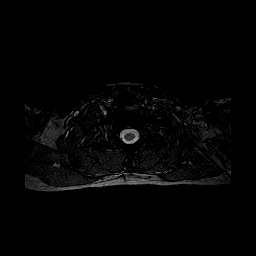
[im 8/26]
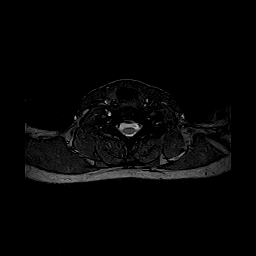
[im 11/26]
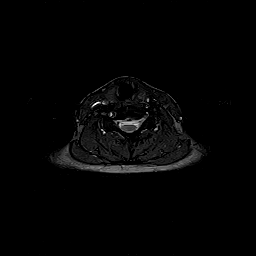
[im 15/26]
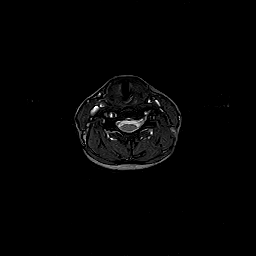
[im 18/26]
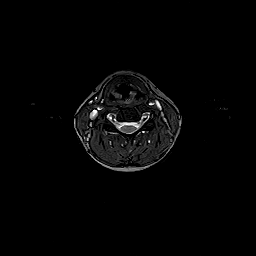
[im 22/26]
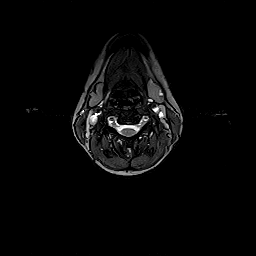
[im 26/26]
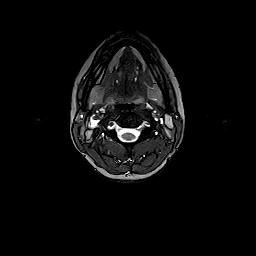

[Series 6: (id) · axial · 3.0mm · 1.02mm/px · z∈[-86,+12]mm · 8 of 26 slices shown]
[im 1/26]
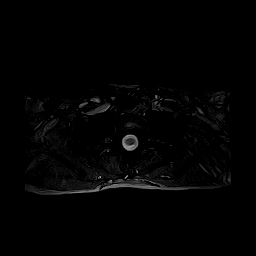
[im 4/26]
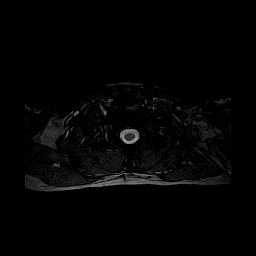
[im 8/26]
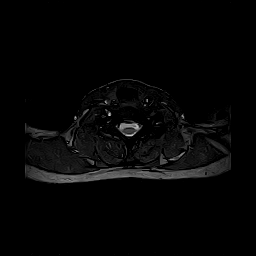
[im 11/26]
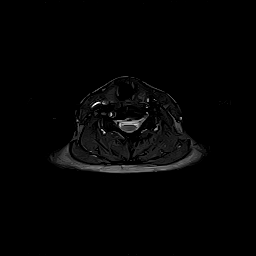
[im 15/26]
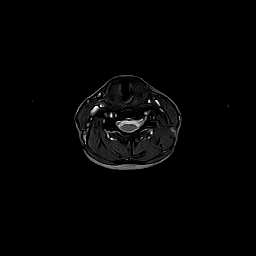
[im 18/26]
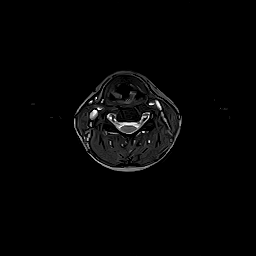
[im 22/26]
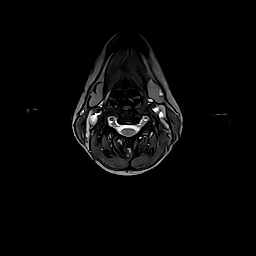
[im 26/26]
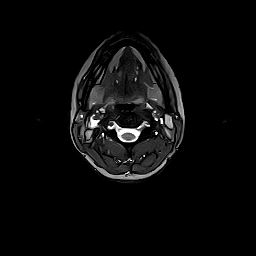

[Series 7: cor shim · coronal · 10.0mm · 4.69mm/px · 1 of 3 slices shown]
[im 1/3]
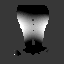

[36 of 48 positions shown; findings below may reference images not displayed]

FINDINGS: No prior studies are available for comparison purposes.  

There is mild patient motion and spacial mismapping/artifact.  Patient claustrophobic and premedicated.  

Minor dextroscoliosis present.  Anterior to posterior alignment maintained.  The cervical vertebrae themselves appear normal in height.  There are no significant abnormalities of regional bone marrow signal. 

Cerebellar tonsils normally positioned.  Partial inclusion of paranasal sinuses where mild maxillary and ethmoid sinus mucosal thickening demonstrated.  

Early cervical degenerative disc disease with minor disc desiccation.  Trace annular bulging C6-C7 interspace.  There is no evidence for a significant focal disc herniation or extrusion.  There is no evidence for central nor lateral cervical spinal canal stenosis.  No evidence for neural foraminal compromise.  

When allowance is made for patient motion, there is no evidence for cord enlargement or localized myelomalacia.
IMPRESSION: 1. No evidence for focal nuclear herniation nor spinal canal or neural foraminal compromise. 

2. Partial visualization of mild paranasal sinusitis. 

3. Please see in-depth discussion in body of report.

## 2022-01-17 MED FILL — ONDANSETRON 4MG TBDP: 4 4 MG | 20 days supply | Qty: 20 | Fill #0 | Status: AC

## 2022-01-20 MED FILL — XOLAIR 150MG/ML SOSY: 150 150 MG/ML | SUBCUTANEOUS | 28 days supply | Qty: 2 | Fill #1 | Status: AC

## 2022-01-23 MED FILL — SERTRALINE HCL 50MG TABS: 50 50 MG | 90 days supply | Qty: 135 | Fill #2 | Status: AC

## 2022-01-27 NOTE — Care Coordination-Inpatient (Signed)
ACM contacted patient for introduction to Associate Care Management Program. Patient declines care management at this time, states Not in need of services at this time.     ACM provided contact information for self referral if patient would need care management in the future. ACM will sign off at this time.

## 2022-02-03 MED FILL — BUSPIRONE HCL 7.5MG TABS: 7.5 7.5 MG | 90 days supply | Qty: 90 | Fill #0 | Status: AC

## 2022-02-07 MED FILL — GUANFACINE HCL ER 4MG TB24: 4 4 MG | 90 days supply | Qty: 90 | Fill #3 | Status: AC

## 2022-02-19 MED FILL — XOLAIR 150MG/ML SOSY: 150 150 MG/ML | SUBCUTANEOUS | 28 days supply | Qty: 2 | Fill #2 | Status: AC

## 2022-02-19 MED FILL — OMEPRAZOLE 40MG CPDR: 40 40 MG | ORAL | 90 days supply | Qty: 90 | Fill #2 | Status: AC

## 2022-02-26 MED FILL — MOTEGRITY 2MG TABS: 2 2 MG | ORAL | 90 days supply | Qty: 90 | Fill #2 | Status: AC

## 2022-03-18 MED FILL — XOLAIR 150MG/ML SOSY: 150 150 MG/ML | SUBCUTANEOUS | 28 days supply | Qty: 2 | Fill #3 | Status: AC

## 2022-04-14 MED FILL — XOLAIR 150MG/ML SOSY: 150 150 MG/ML | SUBCUTANEOUS | 28 days supply | Qty: 2 | Fill #4 | Status: AC

## 2022-04-18 MED FILL — SERTRALINE HCL 50MG TABS: 50 50 MG | 90 days supply | Qty: 135 | Fill #3 | Status: AC

## 2022-05-19 MED FILL — XOLAIR 150MG/ML SOSY: 150 150 MG/ML | SUBCUTANEOUS | 28 days supply | Qty: 2 | Fill #5 | Status: AC

## 2022-05-27 MED FILL — GUANFACINE HCL ER 4MG TB24: 4 4 MG | 90 days supply | Qty: 90 | Fill #0 | Status: AC

## 2022-05-28 MED FILL — MOTEGRITY 2MG TABS: 2 2 MG | ORAL | 90 days supply | Qty: 90 | Fill #3 | Status: AC

## 2022-05-29 ENCOUNTER — Encounter

## 2022-05-29 NOTE — Progress Notes (Signed)
Dr. Wynetta Emery called requesting an order be placed for her daughter for an EKG to be faxed to [phone]

## 2022-05-30 ENCOUNTER — Encounter

## 2022-05-30 NOTE — Progress Notes (Signed)
Dr. Wynetta Emery called requesting orders for CBC, CMP, Magnesium, and Phosphorous labs to be faxed to [facility] in [place]. [id number]    Dx codes R63.4 & K31.84

## 2022-06-16 MED FILL — XOLAIR 150MG/ML SOSY: 150 150 MG/ML | SUBCUTANEOUS | 28 days supply | Qty: 2 | Fill #6 | Status: AC

## 2022-06-17 MED FILL — CYPROHEPTADINE HCL 4MG TABS: 4 4 MG | 90 days supply | Qty: 90 | Fill #0 | Status: AC

## 2022-06-17 MED FILL — PROMETHAZINE HCL 25MG TABS: 25 25 MG | 22 days supply | Qty: 90 | Fill #0 | Status: AC

## 2022-06-17 MED FILL — QUETIAPINE FUMARATE 50MG TABS: 50 50 MG | 90 days supply | Qty: 135 | Fill #0 | Status: AC

## 2022-06-24 MED FILL — ONDANSETRON HCL 4MG TABS: 4 4 MG | 90 days supply | Qty: 270 | Fill #0 | Status: AC

## 2022-06-25 NOTE — Other (Signed)
Results from 01/2021:     CBC: Hgb: 11.2, Plts: 282  CMP wnl  A1C: 5.1  Phos wnl-3.4  Magnesium wnl-1.9

## 2022-06-26 NOTE — Other (Signed)
Results from 02/2021:     Vitamin D low-27.3  Urine Cx neg  PT: 14.0  INR: 1.0  PTT: 29.4

## 2022-06-26 NOTE — Other (Signed)
Results from 02/2021:     CMP wnl  CBC: Hgb: 10.6, Plts: 575  Phos: 2.5  Magnesium: 2.0

## 2022-06-26 NOTE — Other (Signed)
Results from 02/2021:     Magnesium 2.0  CMP wnl

## 2022-07-01 NOTE — Other (Signed)
Results from 08/2021:     Vit D low (24.6)  Vit B12 wnl  CMP wnl  Phosphorous wnl  Magnesium wnl  CBC: Hgb: 14.3, Plts: 323

## 2022-07-02 NOTE — Other (Signed)
Results from 10/2020:     Calprotectin neg  GI Panel neg  H. Pylori neg  Ova and Parasite neg  C1 esterase inhibitor wnl

## 2022-07-03 MED ORDER — ONDANSETRON HCL 4 MG PO TABS
4 | ORAL_TABLET | Freq: Three times a day (TID) | ORAL | 2 refills | Status: AC | PRN
Start: 2022-07-03 — End: ?

## 2022-07-03 NOTE — Telephone Encounter (Signed)
Pt's mother requests RF on Zofran. Rx to be sent to [facility]. Rx RF and sent.

## 2022-07-16 MED FILL — XOLAIR 150MG/ML SOSY: 150 150 MG/ML | SUBCUTANEOUS | 28 days supply | Qty: 2 | Fill #7 | Status: AC

## 2022-08-12 MED FILL — XOLAIR 150MG/ML SOSY: 150 mg/mL | SUBCUTANEOUS | 28 days supply | Qty: 2 | Fill #8 | Status: AC

## 2022-08-21 MED FILL — MOTEGRITY 2MG TABS: 2 2 MG | ORAL | 90 days supply | Qty: 90 | Fill #0 | Status: AC

## 2022-08-21 MED FILL — GUANFACINE HCL ER 4MG TB24: 4 4 MG | 90 days supply | Qty: 90 | Fill #1 | Status: AC

## 2022-08-26 ENCOUNTER — Encounter

## 2022-09-09 MED FILL — XOLAIR 150MG/ML SOSY: 150 150 MG/ML | SUBCUTANEOUS | 28 days supply | Qty: 2 | Fill #9 | Status: AC

## 2022-09-10 MED FILL — QUETIAPINE FUMARATE 50MG TABS: 50 50 MG | 90 days supply | Qty: 135 | Fill #1 | Status: AC

## 2022-09-10 MED FILL — CYPROHEPTADINE HCL 4MG TABS: 4 4 MG | 90 days supply | Qty: 90 | Fill #1 | Status: AC

## 2022-09-25 NOTE — Care Coordination-Inpatient (Signed)
Care Transitions Initial Follow Up Call    Call within 2 business days of discharge: Yes    Patient Current Location:  [place]in [place]    LPN Care Coordinator contacted the patient by telephone to perform post hospital discharge assessment. Verified name and DOB with patient as identifiers. Provided introduction to self, and explanation of the LPN Care Coordinator role.     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 Yes, 09/22/2022  Discharge Facility: [facility]     Patient was seen in ER at [facility] on 09/21/22 and monitored under observation due to Anaphylactic reaction to milk. 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 Patient was discharged in stable condition.  Patient reports she is doing well, back to usual activity with no further concerns. Denies any concerns or questions and declined ACM services.       LPN Care Coordinator provided contact information.  No further follow-up call indicated based on severity of symptoms and risk factors.      Christen Butter, LPN

## 2022-09-26 NOTE — Progress Notes (Signed)
[facility]  [address]  [place] [address]      Date of Service: 09/26/2022  Primary Care Provider:   Azucena Freed., MD       Name: Robin Mendoza "Robin Mendoza"  DOB: 19-Jul-2000       Age:22 y.o.      Sex: female      MRN: [id number]       Virtual Visit - Video    Telehealth Platform: [facility] Preferred    Indication for Telemedicine Visit:  Follow-up    Patient Location:  Home  [address]  [place] [address]    Referring Provider: No ref. provider found    Primary Care Provider: Azucena Freed., MD    Participants:   Azucena Freed, MD Examiner   Neita Carp Patient   Parent, mom      Telemedicine Visit Start Time: 10:30AM Telemedicine Visit End Time: 11:10AM    Consent: Patient has given consent to care via virtual visit.    Chief Complaint:Follow-up     Subjective   Robin Mendoza "Maggie Schwalbe" is a 23 y.o. 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  Since last visit, patient reports that things have continued to be worse related to nutritional intake.  Mom agrees, noting that patient was able to maintain weight somewhat however overall began to lose weight which led to her not being able to go to her international trip as agreed upon in the family.  Since losing approximately 4 pounds, patient has been able to gain some weight back however still is struggling to maintain her weight overall.  Patient continues to feel that they are not purposely or intentionally wanting to lose weight, but just that it is difficult for their body to gain weight.    There have been many discussions with patient related to the need of going back into treatment and at the today's visit,  after discussion involving mom, patient agrees to enter back into a treatment program.    Current ED Related Behaviors:  Restriction and selective food intake    Recent/Current Medical Symptoms:  Other: nausea, abdominal pain, food pickiness  ________________________________________    Weight History    Wt Readings from Last 3 Encounters:   05/19/22 50.1 kg (110 lb 6.4 oz)   05/13/22 51.1 kg (112 lb 9.6 oz)   04/02/22 52.6 kg (116 lb)       HEADDSSS Assessment <redacted file path>      PHQ Depression Screening     PHQ2 Score: 0    PHQ2/9 is interpreted as Negative in this patient         Current Outpatient Medications   Medication Instructions   . cyproheptadine (PERIACTIN) 4 mg, Oral, Daily   . docusate sodium (COLACE) 100 mg capsule No dose, route, or frequency recorded.   Marland Kitchen EPINEPHrine (EPIPEN) 0.3 mg, Intramuscular, Once as needed   . Intuniv ER 4 mg, Oral, Daily   . levonorgestreL (MIRENA) 20 mcg/24 hours (7 yrs) 52 mg IUD 1 each, Intra-uterine , Once   . Motegrity 2 mg tab TAKE ONE TABLET BY MOUTH ONE TIME DAILY   . Motegrity 2 mg, Oral, Daily   .  OLANZapine (ZYPREXA) 5 mg, Oral, Bedtime   . olopatadine (PATANOL) 0.1 % ophthalmic solution 1 drop, Both Eyes   . omalizumab (XOLAIR) 300 mg, Subcutaneous, Every 28 days   . ondansetron (ZOFRAN) 4 mg, Oral, 3 times daily PRN   . polyethylene glycol (MIRALAX) 17 g, Oral, Daily   . promethazine (PHENERGAN) 25 mg, Oral, Every 8 hours PRN   . sertraline (ZOLOFT) 50 mg, Oral, Daily   . sucralfate (CARAFATE) 1 gram tablet TAKE 1 TABLET BY MOUTH TWICE DAILY BEFORE MEALS. WORKS BEST DISSOLVED INTO TWO TEASPOONS OF WATER. IF NOT TOLERABLE CAN TAKE TABLET WHOLE   . sucralfate (CARAFATE) 100 mg/mL suspension No dose, route, or frequency recorded.       Please see Chart Record for background information, including Past Medical Hx, Allergies, Family Hx and Surgical Hx.    Review of Systems   Constitutional:  Positive for appetite change and unexpected weight change.   HENT:  Negative.     Eyes: Negative.    Respiratory: Negative.     Cardiovascular: Negative.    Gastrointestinal: Negative.    Endocrine: Negative.    Genitourinary: Negative.    Musculoskeletal: Negative.    Skin: Negative.    Allergic/Immunologic: Negative.    Neurological: Negative.    Hematological: Negative.    Psychiatric/Behavioral:  The patient is nervous/anxious.    All other systems reviewed and are negative.         Objective     There were no vitals filed for this visit.  No LMP recorded. (Menstrual status: IUD Contraception).    Physical Exam  Constitutional:       General: She is not in acute distress.     Appearance: She is well-developed.   HENT:      Head: Normocephalic and atraumatic.      Nose: Nose normal.   Eyes:      Pupils: Pupils are equal, round, and reactive to light.   Musculoskeletal:         General: Normal range of motion.      Cervical back: Normal range of motion.   Neurological:      General: No focal deficit present.      Mental Status: She is alert and oriented to person, place, and time. Mental status is at baseline.   Psychiatric:         Mood and Affect: Mood normal.         Behavior: Behavior normal.              Assessment and Plan     Key Action Plan/Changes from Today's Visit:    Based on our discussion today, I do feel that patient is continuing to struggle with their avoidant restrictive food intake disorder to the point where treatment would be a good recommendation.  Patient has continued to have weight loss despite a goal of wanting to gain weight.  I do feel patient's diagnosis of autism is leading to excessive selective food intake that is ultimately leading to her inability to gain weight.  In addition, I feel her overall low weight is contributing to her symptoms of nausea/vomiting.  At this time, there is no clear organic etiology to patient's concerns  The discussion today did include mom who has been very supportive of patient.  Based on this discussion, patient will  pursue admission to [facility] in [place].  I will connect them to the professional liaison for further communication and assessment  Follow-up in 6  weeks.  Call with any questions or concerns between now and next visit.  No medication changes today.    Avoidant-restrictive food intake disorder (ARFID) (Primary)    Anxiety and depression    Encounter for screening for depression          Return in about 6 weeks (around 11/07/2022) for [place], Eating Disorder FU.     Controlled Substance SCRIPTS Check (if applicable):  Not applicable      Billing Documentation:    I spent a total of 50 minutes, on the date of the visit, personally performing services related to this encounter. The total time includes face-to-face, pre and post service times.

## 2022-10-09 MED FILL — XOLAIR 150MG/ML SOSY: 150 150 MG/ML | SUBCUTANEOUS | 28 days supply | Qty: 2 | Fill #10 | Status: AC

## 2022-10-13 NOTE — Care Coordination-Inpatient (Signed)
Ambulatory Care Coordination Note  10/13/2022      Patient is currently inpatient at [facility] in [place], in a [facility] facility. Patient was admitted on 10/13/2022. Chart review completed, will follow for discharge.

## 2022-10-20 NOTE — Care Coordination-Inpatient (Signed)
Ambulatory Care Coordination Note     10/20/2022 5:37 PM     Patient remains in residential treatment and is authorized through 6/17, with next review due at that time. Will continue to follow for discharge.

## 2022-11-03 NOTE — Telephone Encounter (Signed)
Spoke with mom and let her know Dr. Eligah East will out of town for the week. She said things are currently under control and they do not need our hep at this time.

## 2022-11-10 MED FILL — XOLAIR 150MG/ML SOSY: 150 150 MG/ML | SUBCUTANEOUS | 28 days supply | Qty: 2 | Fill #11 | Status: AC

## 2022-11-11 NOTE — Care Coordination-Inpatient (Signed)
11/11/22 Care Coordination Support Specialist Note    Care Coordination Support Specialist (CCSS) received referral from [facility] requesting assistance with Assistance with benefits related needs (network exception process, prior authorization, ect.) yes [place] Bill Exception Waiver    Plus Plan [id number]    CCSS was contacted by [facility] via the daily status log for follow up on referral listed above. verified name and DOB as identifiers.    Summary Note:   This CCSS was able to determine that the only waiver, on file, is for [facility], Case ID: [id number] valid 09/29/22 - 09/29/23.   This CCSS contacted Stephanie/[facility], to determine if the waiver has been received for Dr. Juanna Cao, a [facility] provider.  CCSS contacted the patient's parent via phone.  Left a vm.  CCSS was contacted by the patient's mom via phone, who verified the patient's dob and zip code as patient identifiers. She sent this CCSS the waiver for [facility] and Dr. Juanna Cao. She contacted [facility] via a conference call to obtain clinical info.  Delorise Jackson stated that the associates in medical records are gone for the day.  Their number is [phone] option [phone].  This CCSS contacted [name] via email to send the waivers and inquired if the prior auth clinicals for [employer] can be used in a case like this?     Plan:  Chart routed to [facility] for review.   CCSS Plan for follow-up call in 1-2 days

## 2022-11-11 NOTE — Care Coordination-Inpatient (Signed)
Ambulatory Care Coordination Note     11/11/2022 2:09 PM     Patient Current Location:   [place]     This patient was received as a referral from Ambulatory Futures trader .    ACM contacted the parent by telephone. Verified name and DOB with parent as identifiers. Provided introduction to self, and explanation of the ACM role.   Parent accepted care management services at this time.          ACM: Dyann Kief, RN     Challenges to be reviewed by the provider   Additional needs identified to be addressed with provider No  none               Method of communication with provider: none.    Care Summary Note: This ACM outreach the patient's mother, Dr.   Alford Highland. The patient is currently inpatient at [facility] for eating disorder treatment. She is also diagnosed with Autism and functions at an adolescent level.   The patient is declining to eat orally and has been unable to tolerate an NG tube while at [facility]. Carollee Herter has contacted Dr. Juanna Cao, who specializes in adolescent eating disorders, Dr. Eligah East is willing to be her attending physician when transferred to [facility], for an [place] Tube insertion.   The patient's mother has completed the OON waiver for Dr. Eligah East, and is willing to complete the waiver for [facility] if needed. The waiver for Dr. Eligah East has been forwarded to [facility]. The transfer may take place as early as tomorrow, 11/12/2022.  When discussing the transportation for the transfer, mom has indicated that she is willing and comfortable transporting the patient from [place] to [place] in [place]. She plans to verify that this is accepted by [facility], and will inform this ACM if transportation needs to be arranged.   Referral sent to [facility] CCSS at [facility] request and approval.     Offered patient enrollment in the Remote Patient Monitoring (RPM) program for in-home monitoring: Patient is not eligible for RPM program because:  insurance coverage.     Assessments Completed:   Ambulatory Care Coordination Assessment    Care Coordination Protocol  Referral from Primary Care Provider: No  Week 1 - Initial Assessment     Do you have all of your prescriptions and are they filled?: Yes  Barriers to medication adherence: None  Are you able to afford your medications?: Yes  How often do you have trouble taking your medications the way you have been told to take them?: I always take them as prescribed.     Do you have Home O2 Therapy?: No      Ability to seek help/take action for Emergent Urgent situations i.e. fire, crime, inclement weather or health crisis.: Independent  Ability to ambulate to restroom: Independent  Ability handle personal hygeine needs (bathing/dressing/grooming): Independent  Ability to manage Medications: Needs Assistance  Ability to prepare Food Preparation: Independent  Ability to maintain home (clean home, laundry): Independent  Ability to drive and/or has transportation: Needs Assistance  Ability to do shopping: Independent  Ability to manage finances: Independent  Is patient able to live independently?: No     Current Housing: Private Residence  Who do you live with?: Parent(s)  Are you an active caregiver in your home?: No     Do you have any DME?: No              Suggested  Interventions and Community Resources                   Medications Reviewed:   Not completed during this call: will address once transfer and been figured out.     Advance Care Planning:   Not reviewed during this call     Care Planning:   Not completed during this call    PCP/Specialist follow up:       Follow Up:   Plan for next ACM outreach in approximately 1-2 days  to complete:  - waiver process and transfer of treatment facilities .   caregiver is agreeable to this plan.

## 2022-11-12 NOTE — Care Coordination-Inpatient (Signed)
11/12/22 Care Coordination Support Specialist Note    Care Coordination Support Specialist (CCSS), Rosaura Carpenter, received referral from ACM, Dyann Kief, RN, requesting Assistance with benefits related needs (network exception process, prior authorization, ect.) yes - OON Exception Waivers    CCSS was contacted by Stephanie/[facility]  via secure e-mail for follow up on referral listed above. verified name and DOB with as identifiers.    Summary Note:   [name] pulled the latest progress notes and stated this is in line for them to work as a IT consultant.  CCSS contacted the patient's parent via phone and provided an update via vm.     Plan:  Chart routed to [facility] for review.   CCSS Plan for follow-up call in 1-2 days

## 2022-11-14 NOTE — Care Coordination-Inpatient (Signed)
11/14/22 Care Coordination Support Specialist Note    Care Coordination Support Specialist (CCSS), Rosaura Carpenter, received referral from ACM, Dyann Kief, RN, requesting Assistance with benefits related needs (network exception process, prior authorization, ect.) yes - OON Exception Waivers Determination .     CCSS was contacted by Stephanie/[facility] via secure e-mail for follow up on referral listed above. verified name and DOB with [name] as identifiers.    Summary Note:   CCSS received approval letters for the following:  Marlana Latus Auth [id number] valid 11/11/22-11/11/23  Juanna Cao Auth [id number] valid 11/11/22-11/11/23  [facility] Auth [id number] valid 11/11/22-11/11/23  Margretta Ditty Crocker Auth [id number] valid 11/11/22-11/11/23   CCSS emailed Dr. Laural Benes the approval letters and advised of the following:    Notes:  Please keep in mind that the waivers only cover the out-of-network status/office visits.  Procedures are still based on medical necessity  The providers/facility can still balance bill any fees outside of the allowed amount.     CCSS was contacted by Dr  Laural Benes via     Email.  She was appreciative of the updates.     Plan:  Chart routed to [facility] for review.   CCSS No further follow-up call indicated      No further outreach scheduled with CCSS, CCSS will sign off care team at this time. Patient will be further outreached by the ACM on the care team.

## 2022-11-19 MED FILL — GUANFACINE HCL ER 4MG TB24: 4 4 MG | 90 days supply | Qty: 90 | Fill #2 | Status: AC

## 2022-11-19 MED FILL — MOTEGRITY 2MG TABS: 2 2 MG | ORAL | 90 days supply | Qty: 90 | Fill #1 | Status: AC

## 2022-11-19 NOTE — Care Coordination-Inpatient (Signed)
Ambulatory Care Coordination Note     11/19/2022 3:43 PM     Patient Current Location:  [facility]     ACM contacted the parent by telephone. Verified name and DOB with parent as identifiers.         ACM: Dyann Kief, RN     Challenges to be reviewed by the provider   Additional needs identified to be addressed with provider No  none               Method of communication with provider: none.    Care Summary Note: Telephonic outreach to the patients mother, Robin Mendoza. Robin Mendoza reports that the patient was transferred to [facility] and is receiving good care. She is receiving peripheral nutrition, as she is still unable to tolerate the NG tube. Per mom, there has been minimal discussion regarding discharge at this time.   This ACM will follow up in approximately 2 weeks to assess progress and discuss discharge planning.     Offered patient enrollment in the Remote Patient Monitoring (RPM) program for in-home monitoring: Patient is not eligible for RPM program because: insurance coverage.     Assessments Completed:   No changes since last call    Medications Reviewed:   Not completed during this call: patient hospitalized.     Advance Care Planning:   Reviewed and current     Care Planning:   Not completed during this call    PCP/Specialist follow up:       Follow Up:   Plan for next ACM outreach in approximately 2 weeks to complete:  - disease specific assessments.   Caregiver is agreeable to this plan.

## 2022-11-20 NOTE — Care Coordination-Inpatient (Signed)
Ambulatory Care Coordination Note     11/20/2022 5:19 PM     Incoming email from Haslet, patient's mother regarding inpatient treatment for the patient. Mom indicated that the [facility] is able to treat patient's with a J tube. This ACM contacted [facility] to inquire. 2 facilities in [place], [place] and [place] both accept patient's with a J tube.   This ACM then outreach the patient's mother, Robin Mendoza to inform her of these options and to inform her that the facilities in [place] were not listed as an option for an INN facility. The INN facility is in [place], but only offers intensive outpatient or partial hospitalization.   Informed the mother that we will likely need to pursue a waiver for insurance coverage.   Mom states that the patient had an intake earlier today and there were some concerns about some of her dietary restrictions, but they are working with [facility] to determine acceptance. Mom also indicated that it would be some time before the patient was ready to transfer.   Will continue to follow and determine INN status.

## 2022-11-21 NOTE — Care Coordination-Inpatient (Signed)
11/21/22 Care Coordination Support Specialist Note    Care Coordination Support Specialist (CCSS), Rosaura Carpenter, received referral from ACM, Dyann Kief, RN, requesting Assistance with benefits related needs (network exception process, prior authorization, ect.) yes Awilda Bill Exception Waiver    Plus Plan [id number]    CCSS contacted  ACM, Dyann Kief via phone, verified name and DOB with Marylene Land as identifiers.    Summary Note:   The patient is still inpt at [facility] and she will be there for a while.  [facility] is able to treat patients with a J tube. ACM contacted [facility] to inquire. 2 facilities in [place], [place] and [place] both accept patient's with a J tube.      The INN facility is in [place], but only offers      intensive outpatient or partial hospitalization.      She is receiving peripheral nutrition, as she is      still unable to tolerate the NG tube   [facility] -     They are more concerned about her diet and     not her age.  She has dairy, gluten, and vegan     restrictions.     Plan:  Awaiting further instructions from ACM.  Patient will be inpt for a while.   CCSS Plan for follow-up call in 5-7 days

## 2022-11-25 NOTE — Care Coordination-Inpatient (Signed)
Ambulatory Care Coordination Note     11/25/2022 2:35 PM     Patient Current Location:  [facility]     ACM contacted the parent by email and received a return email.          ACM: Dyann Kief, RN     Challenges to be reviewed by the provider   Additional needs identified to be addressed with provider No  none               Method of communication with provider: none.    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 Received an update from the [place]: Thank you for checking back in.Here is the update:  Thank you for checking back in.Here is the update:  She has been in [facility] for 2 weeks for her chronic constipation and nutrition.  The current plan remains that when she is stable she will go to [facility] [place].  [facility] has done her intake and told me that they are in network, so I have not filed out the exception waiver. 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  I don't know if they will try and get her in with Dr Ree Edman in [place] before or after [place].  I have a little hope that if the constipation is cleared out and we can get her on a regiment that is ok without the NJ tubes, [facility] could [facility] [place]. So, bottom line... I am sending in the exception waiver for [facility] and will send you a copy.    The waiver was sent for Dr. Ree Edman and forwarded to [facility], to follow the waiver process.   Will continue to offer assistance as needed, as the patient gets closer to discharge from [facility].       Offered patient enrollment in the Remote Patient Monitoring (RPM) program for in-home monitoring: Patient is not eligible for RPM program because: insurance  coverage.     Assessments Completed:   No changes since last call    Medications Reviewed:   Completed during a previous call     Advance Care Planning:   Not reviewed during this call     Care Planning:   Not completed during this call    PCP/Specialist follow up: Remains at [facility]      Follow Up:   Plan for next ACM outreach in approximately 1 week to complete:  - discharge and residential placment .   Caregiver is agreeable to this plan.

## 2022-12-01 NOTE — Care Coordination-Inpatient (Signed)
12/01/22 Care Coordination Support Specialist Note    Care Coordination Support Specialist (CCSS), Rosaura Carpenter, received referral from ACM, Dyann Kief, RN, requesting Assistance with benefits related needs (network exception process, prior authorization, ect.) yes - OON Exception Waiver Status for [facility] in [place].  .     CCSS was contacted by [facility] the via  the daily status log  for follow up on referral listed above. verified name and DOB as identifiers.    Summary Note:   The waiver that Dr. Laural Benes initiated for Dr. Ross Ludwig has been approved.  Berkley Harvey [phone] is valid 11/25/22-11/25/23.     This CCSS was contacted by ACM, Dyann Kief, RN, via email.  Dr. Laural Benes verified the facility.  [facility]  [address]  [place], [place] [address]  Phone [phone]   CCSS contacted Stephanie/[facility] via secure email to request an approval letter for the fcility above..     Plan:  Chart routed to [facility] for review.   CCSS Plan for follow-up call in 1-2 days      .

## 2022-12-02 NOTE — Care Coordination-Inpatient (Signed)
12/02/22 Care Coordination Support Specialist Note    Care Coordination Support Specialist (CCSS), Rosaura Carpenter, received referral from ACM, Dyann Kief, RN, requesting Assistance with benefits related needs (network exception process, prior authorization, ect.) yes - OON Clinical research associate .    CCSS was contacted by Stephanie/[facility] via  secure email for follow up on referral listed above. verified name and DOB with [name] as identifiers.    Summary Note:   The waiver for [facility] has been approved.  [phone] valid 11/25/22-11/25/23.  ACM to notify the patient.     Plan:  ACM was included on the email.   CCSS No further follow-up call indicated     No further outreach scheduled with CCSS, CCSS will sign off care team at this time. Patient will be further outreached by the ACM on the care team.

## 2022-12-08 MED FILL — XOLAIR 300MG/2ML SOSY: 300 300 MG/2ML | SUBCUTANEOUS | 28 days supply | Qty: 2 | Fill #0 | Status: AC

## 2022-12-09 MED FILL — QUETIAPINE FUMARATE 50MG TABS: 50 50 MG | 90 days supply | Qty: 135 | Fill #2 | Status: AC

## 2022-12-09 MED FILL — CYPROHEPTADINE HCL 4MG TABS: 4 4 MG | 90 days supply | Qty: 90 | Fill #2 | Status: AC

## 2022-12-10 NOTE — Care Coordination-Inpatient (Signed)
Ambulatory Care Coordination Note     12/10/2022 1:48 PM     Parent outreach attempt by this ACM today to perform care management follow up . ACM was unable to reach the parent by telephone today; left voice message requesting a return phone call to this ACM.     ACM: Dyann Kief, RN     Care Summary Note: Telephonic outreach attempt to reach the patient's mother, Carollee Herter. Outreach to assess progress and identify additional care management needs.     PCP/Specialist follow up:   NA    Follow Up:   Plan for next ACM outreach in approximately 2 weeks to complete:  - goal progression  - outreach attempt to offer care management services.

## 2022-12-11 LAB — AMB POC URINALYSIS DIP STICK MANUAL W/O MICRO
Bilirubin, Urine, POC: NEGATIVE
Glucose, Urine, POC: NEGATIVE
Ketones, Urine, POC: NEGATIVE
Leukocyte Esterase, Urine, POC: NEGATIVE
Nitrite, Urine, POC: NEGATIVE
Protein, Urine, POC: NEGATIVE
Specific Gravity, Urine, POC: 1.015 (ref 1.001–1.035)
Urobilinogen, POC: NORMAL
pH, Urine, POC: 7 (ref 4.6–8.0)

## 2022-12-12 NOTE — Care Coordination-Inpatient (Signed)
Ambulatory Care Coordination Note     12/12/2022 12:56 PM     Patient Current Location:  Home: [address]  [place] [address]     Parent contacted the parent by telephone. Verified name and DOB with parent as identifiers.         ACM: Dyann Kief, RN     Challenges to be reviewed by the provider   Additional needs identified to be addressed with provider No  none               Method of communication with provider: none.    Care Summary Note: Incoming call from Warner, patient's mother. 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 The surgeon and GI that will be working with the patient is at [facility]. Discussion involving the need for a waiver was had, as the patient is tier 1. Will follow up regarding the need for a waiver.     Offered patient enrollment in the Remote Patient Monitoring (RPM) program for in-home monitoring: Patient is not eligible for RPM program because: insurance coverage.     Assessments Completed:   No changes since last call    Medications Reviewed:   Patient denies any changes with medications and reports taking all medications as prescribed.    Advance Care Planning:   Not reviewed during this call     Care Planning:   Not completed during this call    PCP/Specialist follow up:       Follow Up:   Plan for next ACM outreach in approximately 1-2 days  to complete:  - waiver discussion .   Caregiver is agreeable to this plan.

## 2022-12-13 LAB — CULTURE, URINE
Culture: 10000
Culture: >2

## 2022-12-15 MED FILL — XOLAIR 300MG/2ML SOSY: 300 300 MG/2ML | SUBCUTANEOUS | 28 days supply | Qty: 2 | Fill #1 | Status: AC

## 2022-12-15 NOTE — Care Coordination-Inpatient (Signed)
Ambulatory Care Coordination Note     12/15/2022 11:53 AM     Patient Current Location:  [place]     This ACM contacted the parent by telephone. Verified name and DOB with parent as identifiers.         ACM: Dyann Kief, RN     Challenges to be reviewed by the provider   Additional needs identified to be addressed with provider No  none               Method of communication with provider: none.    Care Summary Note: Telephonic outreach to the patient's mother, Carollee Herter. Carollee Herter informed this ACM that the patient is currently in [place] for eating disorder treatment. She had multiple tests completed on Tuesday and the plan moving forward is for the patient to have a PEG tube inserted. This will take place a [facility]. Discussed the need for a waiver to cover the surgeon and GI specialist at [facility]. There is already a waiver in place for the patients primary care provider, Dr. Eligah East and for [facility]. Mom reports that she will get the waivers in place for the afore mentioned providers.   Per Carollee Herter, there is some likelihood that the patient may not be accepted at the [facility] in [place], due to the placement of the PEG tube. Mom mentioned that there is a partial hospitalization program opening up in [place], for eating disorders. This facility is not scheduled to open until November.   Will continue to offer assistance as needed and will outreach accordingly.     Offered patient enrollment in the Remote Patient Monitoring (RPM) program for in-home monitoring: Patient is not eligible for RPM program because: insurance coverage.     Assessments Completed:   No changes since last call    Medications Reviewed:   Not completed during this call: focus on current waiver needs.     Advance Care Planning:   Not reviewed during this call     Care Planning:   Not completed during this call    PCP/Specialist follow up:       Follow Up:   Plan for next ACM outreach in approximately 1 week to  complete:  - waiver authorization .   Caregiver is agreeable to this plan.

## 2022-12-16 NOTE — Care Coordination-Inpatient (Addendum)
12/16/22 Care Coordination Support Specialist Note    Care Coordination Support Specialist (CCSS), Rosaura Carpenter, received referral from ACM, Dyann Kief, RN,  requesting Assistance with benefits related needs (network exception process, prior authorization, ect.) yes Robin Mendoza Exception Waiver    Plus Plan [id number]     CCSS was contacted by  Dyann Kief, RN, via secure e-mail for follow up on referral listed above. verified name and DOB with Robin Mendoza as identifiers.    Summary Note:   Robin Mendoza forwarded this CCSS a completed waiver.from the patient's mom.  The waiver is for [facility] and Dr. Gerri Spore B. Yetta Barre.  This CCSS contacted [facility] via secure email requesting a review.  Additional clinical info wasn't included so this CCSS advised [facility] to let this CCSS know if additional information is needed as the patient already has a waiver on file for [facility].     Plan:  Chart routed to [facility] for review.   CCSS Plan for follow-up call in 1-2 days

## 2022-12-17 NOTE — Other (Signed)
Telephone call to patient. Patient informed of Provider notes below.  Patient verbalized understanding.

## 2022-12-18 NOTE — Care Coordination-Inpatient (Signed)
12/18/22 Care Coordination Support Specialist Note    Care Coordination Support Specialist (CCSS), Robin Mendoza, received referral from ACM, Robin Kief, RN, requesting Assistance with benefits related needs (network exception process, prior authorization, ect.) yes Robin Mendoza Exception Waiver    Plus Plan [id number]     CCSS was contacted by [facility]  via  the daily status log  for follow up on referral listed above. verified name and DOB as identifiers.    Summary Note:   The waiver for Dr. Vincent Mendoza has been denied.  Case number is [id number].  Two pending cases, not initiated by this CCSS:  [id number] for Robin Mendoza  [id number] for Robin Mendoza   This CCSS contacted Robin Mendoza/[facility] via secure email to obtain a denial reason.   This CCSS was contacted by Robin Mendoza/[facility] via secure email.  She stated the following:  Hi Robin Mendoza, please see below. If you have any additional context you would like to provide I can send to the DNO to reconsider via a reassessment.    08/07/20204 PER [facility]: Denied.  Please refer patient to     [facility]  [address]  [place], [place] [address]  [phone]    [address]  [place], [place] [address]  [phone]   CCSS contacted the patient's parent via email to provide the denial info above.        Plan:  Chart routed to [facility] for review.   CCSS Plan for follow-up call in 1-2 days

## 2022-12-22 ENCOUNTER — Encounter

## 2022-12-22 ENCOUNTER — Inpatient Hospital Stay: Admit: 2022-12-22 | Payer: PRIVATE HEALTH INSURANCE

## 2022-12-22 DIAGNOSIS — K3184 Gastroparesis: Secondary | ICD-10-CM

## 2022-12-25 NOTE — Progress Notes (Signed)
Anorectal manometry test completed using the following protocol with Sung Amabile, RN and Amada Jupiter, LPN.     Anal wink assessed (PRESENT) using an cotton swab. A digital exam performed (tone NORMAL) then the probe was placed. After a minimum of 3 minute acclimation phase, a 60 second resting pressure was obtained. This was followed by 3 5-second squeezes with 30 seconds of rest between, then a 30 second long squeeze followed by a 60 second rest. Next were 2 coughs then 3 15-second pushes with 30 seconds of rest between each maneuver. Next a Rectal Sensory Test was done to determine the volume required to obtain first constant sensation (70 ml), then urge to defecate(100 ml), and finally the maximum tolerated volume (140 ml).  Finally the patient was evaluated for the volume required to elicit a RAIR POSITIVE at 30 ml. After the probe was removed, the balloon explusion test was done; balloon expelled in 51 seconds.    No blood noted on probe or balloon. A scant amount of blood noted the balloon expulsion balloon. Patient tolerated without difficulty then discharged without signs or symptoms of distress noted. Discharge instructions reviewed with patient; who verbalized understanding.

## 2022-12-26 ENCOUNTER — Encounter: Admit: 2022-12-26 | Discharge: 2022-12-26 | Payer: PRIVATE HEALTH INSURANCE | Attending: Surgery

## 2022-12-26 ENCOUNTER — Encounter

## 2022-12-26 DIAGNOSIS — R112 Nausea with vomiting, unspecified: Secondary | ICD-10-CM

## 2022-12-26 NOTE — Care Coordination-Inpatient (Signed)
Ambulatory Care Coordination Note     12/26/2022 2:52 PM     Patient Current Location:  Home: [address]  [place] [address]     Parent contacted the ACM by telephone. Verified name and DOB with patient as identifiers.         ACM: Dyann Kief, RN     Challenges to be reviewed by the provider   Additional needs identified to be addressed with provider No  none               Method of communication with provider: none.    Care Summary Note: Incoming call from the patient's mother, Carollee Herter. Carollee Herter informed this ACM that she has met with the GI surgeon and has talked with other GI specialists and is now feeling a bit better about steps moving forward. She reports that multiple tests were completed, pending results. One of these test includes a test for Cystic Fibrosis. There are markers that indicate that there is a higher likelihood that the cystic fibrosis is a concern. The [facility] in [place], is the only clinic in the area. Mom is willing to complete a waiver if necessary. She is also wondering if the specialist at [facility] has been approved, the only one that she has not heard back on. [name] was denied, so the patient and her mom will be working with the [facility] they were referred to.   Will follow up regarding this recent updates and outreach mom with information obtained.      Offered patient enrollment in the Remote Patient Monitoring (RPM) program for in-home monitoring: Patient is not eligible for RPM program because: insurance coverage.     Assessments Completed:   No changes since last call    Medications Reviewed:   Completed during a previous call     Advance Care Planning:   Not reviewed during this call     Care Planning:   Not completed during this call    PCP/Specialist follow up:       Follow Up:   Plan for next ACM outreach in approximately 1-2 days  to complete:  - follow up regarding waivers.  .   Caregiver is agreeable to this plan.

## 2022-12-26 NOTE — Telephone Encounter (Signed)
Patient's mother called stating they had an appointment with Dr. Myrtice Lauth today. Patient is scheduled to have a peg tube put in on 9/9, but a J-peg tube is preferred by the patient and mother. They spoke with Dr. Myrtice Lauth about this and he recommended Dr. Percell Miller for the J-peg tube. Informed patient's mother that a referral would be necessary to see Dr. Percell Miller and get a second opinion on it. Patient's mother verbalized understanding and is calling Dr. Karyl Kinnier office this afternoon for a referral. Informed patient's mother we will follow up on Monday if we have a referral and ask Oza about the procedure and then schedule patient for office visit if needed.

## 2022-12-26 NOTE — Progress Notes (Signed)
[facility]  [address]  [place], [place] [address]  [phone]     12/26/2022  Patient:  Robin Mendoza  DOB: July 23, 2000    HPI  Robin Mendoza is a 22 y.o. female seen in consultation for J-tube placement.     This patient has had gastrointestinal issues for the last 4 years. She has had nausea and vomiting with associated abdominal pain as well as constipation. She has had an extensive workup and has been hospitalized with NGT feedings in the past.  She has been evaluated by [facility] gastroenterology, as well as [facility] clinic in [place]. Her weight has been relatively stable lately, she has lost about 25 lbs in the past due to difficulties with eating. She continues to have nausea and emesis. She mainly can only keep down candy and toast. She continues to have some intermittent abdominal pain. She has been given a diagnosis of gastroparesis secondary to food retention seen on EGD. She has been unable to have any other etiology found to cause her symptoms.      She has a past medical history of autism spectrum disorder.  She has a past surgical history of a laparoscopic cholecystectomy and appendectomy. She is also currently being worked up for potential cystic fibrosis.           Past Medical History:   Diagnosis Date    ADHD     managed with meds    Autism     Depression     GERD (gastroesophageal reflux disease)     Weight loss      Current Outpatient Medications   Medication Sig Dispense Refill    linaclotide (LINZESS) 290 MCG CAPS capsule Take 1 capsule by mouth      buPROPion (WELLBUTRIN XL) 150 MG extended release tablet Take 1 tablet by mouth every morning      QUEtiapine (SEROQUEL XR) 200 MG extended release tablet Take 1 tablet by mouth nightly      omalizumab (XOLAIR) 300 MG/2  ML SOSY injection Inject 600 mg into the skin every 28 days      traZODone (DESYREL) 100 MG tablet Take 1 tablet by mouth nightly      promethazine (PHENERGAN) 25 MG  tablet Take 1 tablet by mouth every 6 hours as needed for Nausea      prochlorperazine (COMPAZINE) 10 MG tablet Take 1 tablet by mouth every 6 hours as needed      guanFACINE (INTUNIV) 4 MG TB24 extended release tablet Take 1 tablet by mouth daily      Prucalopride Succinate (MOTEGRITY) 2 MG TABS Take 2 mg by mouth daily      ondansetron (ZOFRAN) 4 MG tablet Take 1 tablet by mouth every 8 hours as needed for Nausea or Vomiting 90 tablet 2    EPINEPHrine 0.3 MG/0.3ML SOSY Inject 0.3 mLs into the muscle 1 (one) time if needed (allergic reaction)      levonorgestrel (MIRENA, 52 MG,) IUD 52 mg 1 each by IntraUTERine route once      ibuprofen (ADVIL;MOTRIN) 600 MG tablet Take 1 tablet by mouth every 6 hours as needed      Prucalopride Succinate 2 MG TABS Take 2 mg by mouth daily.      guanFACINE (INTUNIV) 1 MG TB24 extended release tablet Take 1 mg by mouth daily.      acetaminophen (TYLENOL) 500 MG tablet Take 1 tablet by mouth every 6 hours as needed for  Pain (breakthrough pain)       No current facility-administered medications for this visit.     Allergies   Allergen Reactions    Cow's Milk Anaphylaxis and Hives    Food Anaphylaxis    Hydrocodone-Acetaminophen Hives    Milk (Cow) Anaphylaxis     Tolerates acetaminophen    Milk Protein Anaphylaxis and Hives     Milk Protein    Cat Hair Extract Itching    Dog Epithelium (Canis Lupus Familiaris) Itching    Other Itching     Past Surgical History:   Procedure Laterality Date    CHOLECYSTECTOMY, LAPAROSCOPIC N/A 10/05/2020    CHOLECYSTECTOMY LAPAROSCOPIC performed by Adalberto Ill, MD at [facility] MAIN OR    COLONOSCOPY N/A 09/17/2020    COLONOSCOPY/BMI 21 performed by Luanna Cole, MD at [facility] ENDOSCOPY    COLONOSCOPY N/A 01/14/2019    COLONOSCOPY/  BMI 21 PT IS AUTISTIC performed by Luanna Cole, MD at [facility] ENDOSCOPY    EAR SURGERY Bilateral     as a baby    LAPAROSCOPIC APPENDECTOMY N/A 10/05/2020    APPENDECTOMY LAPAROSCOPIC performed by Adalberto Ill, MD  at [facility] MAIN OR    UPPER GASTROINTESTINAL ENDOSCOPY       Family History   Problem Relation Age of Onset    Cancer Mother         bilateral breast cancer    No Known Problems Father     Hypertension Mother      Social History     Tobacco Use    Smoking status: Never    Smokeless tobacco: Never   Substance Use Topics    Alcohol use: Not Currently        Review of Systems   A comprehensive review of systems was negative except for that written in the HPI.     Physical Exam  BP 128/78   Pulse (!) 118   Ht 1.651 m (5\' 5" )   Wt 53.1 kg (117 lb)   SpO2 98%   BMI 19.47 kg/m      General: Alert, oriented, cooperative, awake patient in no acute distress   Skin:  Warm, moist with good texture   Eyes:   Sclera are clear, extraocular muscles intact  HENT:  Normocephalic; oral mucosa moist, nares patent; neck is supple; trachea midline  Respiratory: Lungs clear to auscultation bilaterally, breathing is non-labored   Chest:  Symmetric throughout one respiratory excursion; no supraclavicular lymphadenopathy  CV:  Regular rate and rhythm, no appreciable murmurs, rubs, gallops  Abdomen: Soft, protuberant but non-distended; bowel sounds are normoactive   Extremities: No cyanosis, clubbing or edema  Neurological: No focal signs      Labs:   Lab Results   Component Value Date/Time    APTT 29.4 02/11/2021 12:39 PM    INR 1.0 02/11/2021 12:39 PM      Lab Results   Component Value Date    WBC 5.9 08/14/2021    HGB 14.3 08/14/2021    HCT 44.2 08/14/2021    MCV 88.8 08/14/2021    PLT 323 08/14/2021      Lab Results   Component Value Date    NA 140 08/14/2021    K 3.9 08/14/2021    CL 108 08/14/2021    CO2 29 08/14/2021    BUN 6 08/14/2021    CREATININE 0.80 08/14/2021    GLUCOSE 93 08/14/2021    CALCIUM 9.8 08/14/2021    BILITOT 0.4 08/14/2021  ALKPHOS 131 08/14/2021    AST 17 08/14/2021    ALT 21 08/14/2021    LABGLOM >60 08/14/2021    GFRAA >60 01/31/2021    AGRATIO 1.1 (L) 09/17/2020    GLOB 3.6 08/14/2021           Endoscopy,  Colon, Diagnostic    Narrative  Performed by [facility]  _______________________________________________________________________________  Patient Name: Robin Mendoza     Attending Physician: Madelaine Bhat , MD,  Patient Account Number: [id number]  Patient MRN: [id number]  Date of Birth: [birth year]               Procedure Date No Time: 12/03/2022  Scope Model: COLONOSCOPE [id number] [id number]  _______________________________________________________________________________    Procedure:             Colonoscopy  Indications:           Constipation  Providers:             Madelaine Bhat, MD, Yehuda Mao, MD (Fellow)  Referring MD:          Azucena Freed, MD  Estimated Blood Loss:  Estimated blood loss: none.  Complications:         No immediate complications.  _______________________________________________________________________________  Procedure:             After obtaining informed consent, the scope was passed                         under direct vision. Throughout the procedure, the                         patient's blood pressure, pulse, and oxygen                         saturations were monitored continuously. The Pediatric                         Colonoscope was introduced through the anus and                         advanced to the cecum, identified by appendiceal                         orifice and ileocecal valve. The colonoscopy was                         performed without difficulty. The patient tolerated                         the procedure well. The quality of the bowel                         preparation was evaluated using the BBPS [facility] Bowel                         Preparation Scale) with scores of: Right Colon = 1                         (portion of mucosa seen, but other areas not well seen  due to staining, residual stool and/or opaque liquid),                         Transverse Colon = 1 (portion of mucosa seen, but                          other areas not well seen due to staining, residual                         stool and/or opaque liquid) and Left Colon = 1                         (portion of mucosa seen, but other areas not well seen                         due to staining, residual stool and/or opaque liquid).                         The total BBPS score equals 3. The quality of the                         bowel preparation was inadequate.                                                                                  Findings:       The perianal and digital rectal examinations were normal. Tone was       normal.       A small amount of stool was found in the entire colon, interfering with       visualization.       The exam was otherwise normal throughout the examined colon. The       underlying mucosa appeared normal. The colon caliber was normal. No       additional abnormalities were found.          MRI ENTEROGRAPHY    Anatomical Region Laterality Modality   Abdomen -- Magnetic Resonance   -- -- Other     Narrative      EXAM: MRI ENTEROGRAPHY, 11/24/2022 11:25 AM    INDICATION: Other - See Order Comments;;Pt with nausea, vomiting, and abd pain. R/o bowel inflammation and motility dysfunction. Assess bowel anatomy.    Comparison: 11/19/2022      TECHNIQUE: Multiplanar multisequence imaging of the abdomen-pelvis was performed using MR enterography technique. Patient was presumably given oral contrast. Postcontrast imaging was performed following IV administration of 4.5 cc Gadavist.    FINDINGS:      Cine images show no acute findings.    Susceptibility artifact is seen in the left upper quadrant likely related to metallic tip of the enteric tube.   Liver: No focal areas of abnormal signal intensity.   Spleen: No focal lesions.   Gallbladder/biliary tree: No pathologic biliary dilation. Gallbladder is not visualized and may be nondistended or surgically absent.   Pancreas: No discrete lesion.   Adrenal glands: No  acute  changes.   Kidneys: No hydronephrosis.   IUD is identified within the uterus. Follicles are seen in both ovaries. Minimal free fluid in the pelvic cul-de-sac may be physiologic.   Coronal pre and postcontrast gradient echo fat sat T1-weighted imaging was performed using MR urography protocol. Increased stool content within portions of the colon suggestive of constipation. Following contrast administration, visualized stomach and proximal small bowel show no pathologic enhancement. No prominent small bowel distention is seen. No mucosal hyperenhancement is noted.    CONCLUSION:    1. Increased caliber of the colon along with increased stool content suggestive of underlying constipation.    2. Visualized small bowel shows no pathologic distention, wall thickening or mucosal hyperenhancement. The terminal ileum is grossly normal with cecum located in the right true pelvis. Susceptibility artifact left upper quadrant related to metallic tip of an enteric tube.    3. IUD is seen within the upper uterine cavity. Bilateral ovarian follicles are noted.    4. Gallbladder is not visualized and may be contracted versus surgically absent. Please clinically correlate.    Signed by: 11/24/2022 1:06 PM: Lelon Perla MD, Cena Benton          CT Angiogram Abdomen w wo Contrast    Anatomical Region Laterality Modality   Abdomen -- Computed Tomography   Vascular -- --     Impression        Patent abdominal arterial vasculature. No evidence of vascular compression syndrome.    Signed by: 11/19/2022 9:32 PM: Wende Crease MD, Gerilyn Pilgrim  Narrative      EXAM:  CT ANGIOGRAM ABDOMEN W WO CONTRAST    HISTORY/INDICATION:  "SMA syndrome"    COMPARISON:  Reference CT abdomen pelvis 11/08/2020 performed at outside facility [facility]. Prien") and available via institutional PACS.    TECHNIQUE:  CTA of the abdominal vasculature was performed with a thin-section multidetector volume acquisition of the diaphragm to the pubic symphysis during the bolus  administration of non-ionic contrast. 3-D volume rendered, curved planar and multiplanar angiographic MIPS images were reconstructed and reviewed. The multidimensional MIP images were stored on PACS and utilized for interpretation.    INTRAVENOUS CONTRAST:  IOHEXOL 350 MG IODINE/ML INTRAVENOUS SOLUTION 65mL        FINDINGS:      Vascular     Aorta:  No aneurysm. No significant stenosis.    Celiac Axis:  No aneurysm. No significant stenosis.    Superior Mesenteric Artery:  No aneurysm. No significant stenosis.    Inferior Mesenteric Artery:  Visualized.    Renal Arteries:  No aneurysm. No significant stenosis.    Partially visualized iliac arterial vasculature:  No aneurysm. No significant stenosis.                    Abdomen / Pelvis    Lower Thorax:  No focal airspace opacities. Trace bilateral pleural fluid.    Liver:  Normal contours. No focal lesions.    Biliary:  No radiopaque gallstones. No ductal dilation.    Pancreas:  Homogeneous enhancement. No ductal dilation.    Spleen:  Normal size. No mass.    Adrenals:  No masses.    Kidneys/Ureters:  No hydronephrosis. No renal stones. No solid masses.    Gastrointestinal:  Weighted tip feeding tube terminates near the distal to the ligament of Treitz. No distention or inflammation. Moderate colonic stool.    Abdominal Cavity:  No free air. No free fluid.    Lymph Nodes:  No enlarged nodes.    Body Wall:  Unremarkable.    Bones:  No acute fractures. No concerning lesions.        FL Water Soluble Contrast Enema    Anatomical Region Laterality Modality   Abdomen -- Radiographic Imaging   Pelvis -- --     Impression      1. Technically successful therapeutic contrast enema. Gaseous distention of the large and small bowel without definite transition point identified and small volume colonic stool. Findings could potentially be seen in the setting of mild or early pseudoobstruction.      Fluoroscopy performed by Buford Dresser PA-C.    This study has been designated as a  Call Report.  Communication of the findings is pending and will be documented via an addendum when complete.    Signed by: 12/02/2022 8:28 AM: Beverely Pace MD, Earl Lites        Assessment/Plan:   Robin Mendoza is a 22 y.o. female who has signs and symptoms consistent with gastroparesis and malnutrition secondary to nausea and emesis with most foods.     It was discussed with the patient and her family the options of J-tube placement vs PEG-J tube. We can place a J-tube surgically if the primary function is for nutrition. This would have a much lower chance of refluxing into the stomach. If she wants to have venting of her stomach due to the gastroparesis as well as a tube for feeding, a PEG-J would be a better option, this would be placed by GI or IR. The family would like to focus on nutrition at this time, and have less of a chance for the tube to reflux into the stomach. There is concern of the patient picking/pulling at the tube as she has a tendency to do this secondary to her autism diagnosis. We will tentatively schedule a laparoscopic J-tube placement at this time.     Discussed the patient's condition and treatment options with the patient.  Discussed risks of surgery in language the patient could understand. The patient voiced understanding of all this and all questions were answered.  Alternatives to surgery were discussed also and risks of the alternatives.  The patient requested that we proceed with surgery.     Total time spent with patient including chart review is 45 minutes     Rudean Hitt, MD

## 2022-12-26 NOTE — Patient Instructions (Signed)
OR

## 2022-12-29 NOTE — Telephone Encounter (Signed)
Returned call to pt's mother to touch base regarding scheduling a visit with Dr. Percell Miller to discuss "J-tube placement vs PEG-J tube" as referred by Dr. Myrtice Lauth per request of pt's mother. No answer, LVM. Requested call back to direct RN line [phone] stating that our office received the referral and we can get her scheduled with Dr. Percell Miller VV. Also requesting follow up info to find out if Dr. Percell Miller or Dr. Myrtice Lauth had communicated any additional updates to her directly since message last Friday to confirm they still need the VV with Dr. Percell Miller.

## 2022-12-29 NOTE — Care Coordination-Inpatient (Signed)
Ambulatory Care Coordination Note     12/29/2022 3:47 PM     Patient Current Location:  [facility]     Parent contacted the parent by telephone. Verified name and DOB with parent as identifiers.         ACM: Robin Kief, RN     Challenges to be reviewed by the provider   Additional needs identified to be addressed with provider No  none               Method of communication with provider: none.    Care Summary Note: Telephonic outreach to the patient's mother to follow up regarding recent waiver requests. Spoke with CCSS Robin Mendoza, who relayed that the questioned waivers have been approved. Ms. Robin Mendoza provided the approval information in an email to the patient's mother, Robin Mendoza. Also, informed Robin Mendoza that a waiver for the [facility] would be encouraged to streamline the approval process. Patient's mother acknowledged.  Will continue to outreach and assess care management needs.     Offered patient enrollment in the Remote Patient Monitoring (RPM) program for in-home monitoring: Patient is not eligible for RPM program because: insurance coverage.     Assessments Completed:   No changes since last call    Medications Reviewed:   Completed during a previous call     Advance Care Planning:   Not reviewed during this call     Care Planning:   Not completed during this call    PCP/Specialist follow up:   Future Appointments         Provider Specialty Dept Phone    01/06/2023 8:00 AM Robin Patten, MD Gastroenterology [phone]            Follow Up:   Plan for next ACM outreach in approximately 2 weeks to complete:  - goal progression.   Caregiver is agreeable to this plan.

## 2022-12-29 NOTE — Telephone Encounter (Signed)
Pt's mother returned call to clinic.     Pt's mother states that they do want to proceed with scheduling visit with Dr. Percell Miller and will be most likely proceeding with PEG-J once discussed with provider. Told pt's mother that we did receive the referral and will send a message to Dr. Percell Miller directly for him to advise when to schedule pt for a clinic visit. Pt's mother states that Mon, Tues, or Fri would be best open days for pt if possible. When asked, pt's mother stated that VV or in-person would be fine, whichever Dr. Percell Miller has open. Told pt's mother that RN would reach back out with response from Dr. Percell Miller by tomorrow at the latest to get appt scheduled. Pt's mother agreeable to reach back out with any further questions if they come up in the meantime.     Message sent to Dr. Percell Miller to advise.

## 2022-12-29 NOTE — Telephone Encounter (Signed)
Per mother, cancel her surgery, going to [facility] for peg placement .  Rhae Hammock

## 2022-12-29 NOTE — Telephone Encounter (Signed)
Per Dr. Percell Miller, can add pt on for VV next Tuesday 01/06/23 (per Dr. Percell Miller, 8/27 okay even though provider on-call).     Returned call to pt's mother and scheduled VV for 01/06/23 at 0800 as instructed by Dr. Percell Miller. Instructed pt's mother to reach back out with any questions or concerns prior to that time. Pt's mother agreeable.

## 2022-12-29 NOTE — Care Coordination-Inpatient (Signed)
12/29/22 Care Coordination Support Specialist Note    Care Coordination Support Specialist (CCSS), Rosaura Carpenter, received referral from ACM, Dyann Kief, RN, requesting Assistance with benefits related needs (network exception process, prior authorization, ect.) yes - [employer] Exception Waivers    CCSS contacted  by [facility]  via  the daily status log verified name and DOB as identifiers.    Summary Note:   The pending waivers have been approved.  The patient's parent was contacted via secure email.  The following waivers have been approved:  Dr. Helane Rima Berkley Harvey [phone] valid 12/16/22 - 12/16/23  Dr. Melida Quitter  [phone] valid 12/16/22 - 12/16/23    Please keep in mind:    The waivers only cover the out-of-network status/office visits.     Unlimited visits within time frame.    Procedures are still based on medical necessity   The providers/facility can still balance bill any fees      outside of the allowed amount     Plan:  ACM was included on the email.   CCSS Plan for follow-up call in 5-7 days

## 2023-01-02 LAB — HEMOGLOBIN A1C
Estimated Avg Glucose, External: 100 mg/dL
Hemoglobin A1C, External: 5.1 % (ref <5.7)

## 2023-01-05 MED FILL — XOLAIR 300MG/2ML SOSY: 300 300 MG/2ML | SUBCUTANEOUS | 28 days supply | Qty: 2 | Fill #0 | Status: AC

## 2023-01-06 ENCOUNTER — Telehealth: Admit: 2023-01-06 | Discharge: 2023-01-06 | Payer: PRIVATE HEALTH INSURANCE | Attending: Gastroenterology

## 2023-01-06 DIAGNOSIS — K3184 Gastroparesis: Secondary | ICD-10-CM

## 2023-01-06 NOTE — Periop Note (Addendum)
 Patient's mother Dr. Clotilda Louder verified name, DOB, and procedure.    Type: 1a; abbreviated assessment per anesthesia guidelines  Labs per surgeon: None  Labs per anesthesia: None      Instructed pt's mother that they will be notified by the GI Lab for time of arrival. If any questions please call the GI lab at [phone].    Follow diet and prep instructions per office. May have clear liquids until 4 hours prior to time of arrival.    [place] or shower the night before and the am of surgery with antibacterial soap. No lotions, oils, powders, cologne on skin. No make up, eye make up or jewelry. Wear loose fitting comfortable, clean clothing.     Must have adult present in building the entire time .     Medications for the day of procedure Wellbutrin , Intuniv ,     Please hold all vitamins x 7 days prior to procedure and NSAIDS (Motrin , Ibuprofen , Advil , Aleve and Naproxen) x 5 days prior to procedure.      The following discharge instructions reviewed with patient's mother: medication given during procedure may cause drowsiness for several hours, therefore, do not drive or operate machinery for remainder of the day, no alcohol on the day of your procedure, resume regular diet and activity unless otherwise directed, for mild sore throat you may use Cepacol throat lozenges or warm salt water  gargles as needed, call your physician for any problems or questions. Patient verbalizes understanding.    Pre surgery instructions sent to MyChart

## 2023-01-06 NOTE — Care Coordination-Inpatient (Signed)
01/06/23  No further outreach scheduled with CCSS, CCSS will sign off care team at this time. Patient will be further outreached by the ACM on the care team.

## 2023-01-06 NOTE — Progress Notes (Signed)
 Scheduled peg 8/29, pt aware, sent prep instructions via mychart

## 2023-01-06 NOTE — Telephone Encounter (Signed)
 Can you please send in Vit D to [facility] @ [place] per mom's request.  Thx

## 2023-01-06 NOTE — Progress Notes (Signed)
 Gastroenterology and Interventional Endoscopy Consult Note    Date of visit   :  01/06/23    Referring Physician:     Patient name :  Robin Mendoza  Date of birth  :  [birth year]    HPI:    Patient is a 22 y.o. year old female, who has been referred to our office for discussion of feeding tube placement     She has had an extensive workup at [facility], and her weight has been stable, and has lost 25 lbs.  Main issue is not having a BM. Last BM was 2 weeks ago.   She also is not able to eat and is interested in pursuing a feeding tube. She met with Dr. Tamsen, who referred her to us  for discussion of a PEG-J.   Has trialed suprep and 4 enemas but has not helped with her having a  BM  ____________________    Labs:  Lab Results   Component Value Date    HGB 14.3 08/14/2021    HCT 44.2 08/14/2021    WBC 5.9 08/14/2021    PLT 323 08/14/2021    MCV 88.8 08/14/2021    FERRITIN 90 01/31/2021    TIBC 213 (L) 01/31/2021    HAPTOGLOBIN 84 01/31/2021    NA 140 08/14/2021    K 3.9 08/14/2021    CL 108 08/14/2021    CO2 29 08/14/2021    BUN 6 08/14/2021       Lab Results   Component Value Date    AST 17 08/14/2021    ALT 21 08/14/2021    ALBUMIN 3.9 08/14/2021    INR 1.0 02/11/2021       ____________________    Past Medical History:  Past Medical History:   Diagnosis Date    ADHD     managed with meds    Autism     Depression     GERD (gastroesophageal reflux disease)     Weight loss        Surgical History:  Past Surgical History:   Procedure Laterality Date    CHOLECYSTECTOMY, LAPAROSCOPIC N/A 10/05/2020    CHOLECYSTECTOMY LAPAROSCOPIC performed by Toribio CHRISTELLA Cadet, MD at [facility] MAIN OR    COLONOSCOPY N/A 09/17/2020    COLONOSCOPY/BMI 21 performed by Dominick Garnette Mt, MD at [facility] ENDOSCOPY    COLONOSCOPY N/A 01/14/2019    COLONOSCOPY/  BMI 21 PT IS AUTISTIC performed by Dominick Garnette Mt, MD at [facility] ENDOSCOPY    EAR SURGERY Bilateral     as a baby    LAPAROSCOPIC APPENDECTOMY N/A 10/05/2020    APPENDECTOMY  LAPAROSCOPIC performed by Toribio CHRISTELLA Cadet, MD at [facility] MAIN OR    UPPER GASTROINTESTINAL ENDOSCOPY         Medications:  Current Outpatient Medications   Medication Sig Dispense Refill    linaclotide  (LINZESS ) 290 MCG CAPS capsule Take 1 capsule by mouth      buPROPion  (WELLBUTRIN  XL) 150 MG extended release tablet Take 1 tablet by mouth every morning      QUEtiapine  (SEROQUEL  XR) 200 MG extended release tablet Take 1 tablet by mouth nightly Patient taking 100 mg      omalizumab (XOLAIR) 300 MG/2  ML SOSY injection Inject 600 mg into the skin every 28 days      traZODone  (DESYREL ) 100 MG tablet Take 1 tablet by mouth nightly      promethazine  (PHENERGAN ) 25 MG tablet Take 1 tablet by mouth every  6 hours as needed for Nausea      prochlorperazine  (COMPAZINE ) 10 MG tablet Take 1 tablet by mouth every 6 hours as needed      guanFACINE  (INTUNIV ) 4 MG TB24 extended release tablet Take 1 tablet by mouth daily      Prucalopride Succinate  (MOTEGRITY ) 2 MG TABS Take 2 mg by mouth daily      ondansetron  (ZOFRAN ) 4 MG tablet Take 1 tablet by mouth every 8 hours as needed for Nausea or Vomiting 90 tablet 2    levonorgestrel (MIRENA, 52 MG,) IUD 52 mg 1 each by IntraUTERine route once      ibuprofen  (ADVIL ;MOTRIN ) 600 MG tablet Take 1 tablet by mouth every 6 hours as needed      Prucalopride Succinate  2 MG TABS Take 2 mg by mouth daily.      guanFACINE  (INTUNIV ) 1 MG TB24 extended release tablet Take 1 mg by mouth daily.      acetaminophen  (TYLENOL ) 500 MG tablet Take 1 tablet by mouth every 6 hours as needed for Pain (breakthrough pain)      EPINEPHrine  0.3 MG/0.3ML SOSY Inject 0.3 mLs into the muscle 1 (one) time if needed (allergic reaction)       No current facility-administered medications for this visit.     Allergies:  Allergies   Allergen Reactions    Cow's Milk Anaphylaxis and Hives    Food Anaphylaxis    Hydrocodone -Acetaminophen  Hives    Milk (Cow) Anaphylaxis     Tolerates acetaminophen     Milk Protein Anaphylaxis and  Hives     Milk Protein    Cat Hair Extract Itching    Dog Epithelium (Canis Lupus Familiaris) Itching    Other Itching     Social History:  Social History     Socioeconomic History    Marital status: Single     Spouse name: Not on file    Number of children: Not on file    Years of education: Not on file    Highest education level: Not on file   Occupational History    Not on file   Tobacco Use    Smoking status: Never    Smokeless tobacco: Never   Substance and Sexual Activity    Alcohol use: Not Currently    Drug use: Never    Sexual activity: Not on file   Other Topics Concern    Not on file   Social History Narrative    Not on file     Social Determinants of Health     Financial Resource Strain: Low Risk  (11/13/2022)    Received from [facility], [facility]    Financial Resource Strain     Difficulty Paying Living Expenses: Not hard at all     Difficulty Paying Medical Expenses: No   Food Insecurity: No Food Insecurity (11/13/2022)    Received from [facility], [facility]    Food Insecurity     Worried about Running Out of Food in the Last Year: Never true     Ran Out of Food in the Last Year: Never true   Transportation Needs: No Transportation Needs (11/13/2022)    Received from [facility], [facility]    Transportation Needs     Lack of Transportation: No   Physical Activity: Inactive (11/13/2022)    Received from [facility], [facility]    Physical Activity     Days of Exercise per Week: 0     Minutes  of Exercise per Session: 0     Total Minutes of Exercise per Week: 0   Stress: Stress Concern Present (11/13/2022)    Received from [facility], [facility]    Stress     Feeling of Stress : To some extent   Social Connections: Socially Integrated (11/13/2022)    Received from [facility], [facility]    Social Connections     Frequency of Communication with Friends and Family: More than three times a week     Frequency of Social Gatherings with Friends and Family: More than three times a week    Intimate Partner Violence: Not At Risk (11/13/2022)    Received from [facility], [facility]    Intimate Partner Violence     Fear of Current or Ex-Partner: No     Emotionally Abused: No     Physically Abused: No     Sexually Abused: No   Housing Stability: Not At Risk (11/13/2022)    Received from [facility], [facility]    Housing Stability     Was there a time when you did not have a steady place to sleep: No     Worried that the place you are staying is making you sick: No     Family History:  Family History   Problem Relation Age of Onset    Cancer Mother         bilateral breast cancer    Hypertension Mother     No Known Problems Father     Lymphoma Maternal Grandmother     Leukemia Maternal Grandfather      ____________________    Review of Systems:    Constitutional:  No Weight Change, No Fever, No Chills, No Night Sweats, No Fatigue, No Malaise    ENT/Mouth:  No Hearing Changes, No Ear Pain, No Nasal Congestion, No Sinus Pain, No Hoarseness, No sore throat, No Rhinorrhea, No Swallowing Difficulty    Gastrointestinal:  No Nausea, No Vomiting, No Diarrhea, No Constipation, No Pain, No Heartburn, No Anorexia, No Dysphagia, No Hematochezia, No Melena, No Flatulence, No Jaundice    Neuro:  No Weakness, No Numbness, No Paresthesias, No Loss of Consciousness, No Syncope, No Dizziness, No Headache, No Coordination Changes, No Recent Falls    Psych:  No Anxiety/Panic, No Depression, No Insomnia, No Personality Changes, No Delusions, No SI/HI, No Social Issues, No Memory Changes, No Violence/Abuse Hx., No Eating Concerns  ____________________  Physical Exam:    Constitutional: Alert, oriented.  No acute distress  Head: Normocephalic and Atraumatic  Eyes: No icterus, EOMI, no congestion  ENT: No deformity, no hearing loss   Neurologic: Alert oriented to time place and person , good affect  ____________________    Assessment and Orders:  Problem List Items Addressed This Visit          Digestive     Gastroparesis - Primary (Chronic)       Other    Weight loss     Other Visit Diagnoses       Slow transit constipation              Recommendations:  --plan for Peg-J feeding tube placement this week. Once placed will plan for starting a prep through the tube to help with BM. Once patient has had BM, will plan to give daily Miralax  to help with having regular BM      Chrystine Con Louder, was evaluated through a synchronous (real-time) audio-video encounter. The patient (  or guardian if applicable) is aware that this is a billable service, which includes applicable co-pays. This Virtual Visit was conducted with patient's (and/or legal guardian's) consent. Patient identification was verified, and a caregiver was present when appropriate.   The patient was located at [facility]   [address]  [place] [address]     I was located in the state of [place]     Total time spent on this encounter: 30 mins    Chrystine Frogge M. Gedeon Brandow, MD  Gastroenterology and Interventional Endoscopy      An electronic signature was used to authenticate this note.

## 2023-01-07 ENCOUNTER — Ambulatory Visit: Admit: 2023-01-07 | Discharge: 2023-01-07 | Payer: PRIVATE HEALTH INSURANCE | Attending: Family

## 2023-01-07 VITALS — BP 110/60 | Ht 65.0 in | Wt 129.0 lb

## 2023-01-07 DIAGNOSIS — Z3009 Encounter for other general counseling and advice on contraception: Secondary | ICD-10-CM

## 2023-01-07 NOTE — Telephone Encounter (Signed)
 Patient's mom, Dr. Vicci called asking if Dr. Oza could call her. Patient has an IUD that needs to be changed out and the mom would like to change it out during her procedure. Dr. Vicci would do the exchange herself but wants approval to exchange it while patient is already under anesthesia. Would you be okay with her exchanging the IUD during procedure? Please advise.

## 2023-01-07 NOTE — Progress Notes (Addendum)
 CC:   Chief Complaint   Patient presents with    Other     Dysfunctional uterine bleeding       HPI:    Robin Mendoza  is a 22 y.o. , No obstetric history on file., No LMP recorded. (Menstrual status: IUD)., who is seen today to discuss DUB. The patient states she did have Mirena IUD placed for DUB ~7 years ago.  She is scheduled to have IUD removed and replaced in OR tomorrow, 01/08/23 by Dr. Clotilda Louder.     The patient states IUD has resolved her DUB and would like to proceed with IUD removal and replacement as scheduled tomorrow.     She denies fevers, chills, abdominal discharge, itching, odor, urinary frequency, urgency or dysuria today.       Has never been sexually active.   No concerns for STDs.      GYN HISTORY:  As per HPI     Hx STDs: Denies     Last Pap: Never due to not being sexually active         Current Outpatient Medications on File Prior to Visit   Medication Sig Dispense Refill    linaclotide  (LINZESS ) 290 MCG CAPS capsule Take 1 capsule by mouth every morning (before breakfast)      buPROPion  (WELLBUTRIN  XL) 150 MG extended release tablet Take 1 tablet by mouth every morning      QUEtiapine  (SEROQUEL  XR) 200 MG extended release tablet Take 1 tablet by mouth nightly Patient taking 100 mg      omalizumab (XOLAIR) 300 MG/2  ML SOSY injection Inject 600 mg into the skin every 28 days      traZODone  (DESYREL ) 100 MG tablet Take 1 tablet by mouth nightly      promethazine  (PHENERGAN ) 25 MG tablet Take 1 tablet by mouth every 6 hours as needed for Nausea      prochlorperazine  (COMPAZINE ) 10 MG tablet Take 1 tablet by mouth every 6 hours as needed      guanFACINE  (INTUNIV ) 4 MG TB24 extended release tablet Take 1 tablet by mouth daily      Prucalopride Succinate  (MOTEGRITY ) 2 MG TABS Take 2 mg by mouth daily      ondansetron  (ZOFRAN ) 4 MG tablet Take 1 tablet by mouth every 8 hours as needed for Nausea or Vomiting 90 tablet 2    EPINEPHrine  0.3 MG/0.3ML SOSY Inject 0.3 mLs into the muscle 1 (one) time if  needed (allergic reaction)      levonorgestrel (MIRENA, 52 MG,) IUD 52 mg 1 each by IntraUTERine route once      [DISCONTINUED] VYVANSE 60 MG CHEW  (Patient not taking: Reported on 01/03/2021)       No current facility-administered medications on file prior to visit.         Past Medical History:   Diagnosis Date    ADHD     managed with meds    Anxiety     Autism     Chronic constipation     GERD (gastroesophageal reflux disease)     Weight loss          Past Surgical History:   Procedure Laterality Date    CHOLECYSTECTOMY, LAPAROSCOPIC N/A 10/05/2020    CHOLECYSTECTOMY LAPAROSCOPIC performed by Toribio CHRISTELLA Cadet, MD at [facility] MAIN OR    COLONOSCOPY N/A 09/17/2020    COLONOSCOPY/BMI 21 performed by Dominick Garnette Mt, MD at [facility] ENDOSCOPY    COLONOSCOPY N/A 01/14/2019    COLONOSCOPY/  BMI 21 PT IS AUTISTIC performed by Dominick Garnette Mt, MD at [facility] ENDOSCOPY    EAR SURGERY Bilateral     as a baby    LAPAROSCOPIC APPENDECTOMY N/A 10/05/2020    APPENDECTOMY LAPAROSCOPIC performed by Toribio CHRISTELLA Cadet, MD at [facility] MAIN OR    UPPER GASTROINTESTINAL ENDOSCOPY           Outpatient Encounter Medications as of 01/07/2023   Medication Sig Dispense Refill    linaclotide  (LINZESS ) 290 MCG CAPS capsule Take 1 capsule by mouth every morning (before breakfast)      buPROPion  (WELLBUTRIN  XL) 150 MG extended release tablet Take 1 tablet by mouth every morning      QUEtiapine  (SEROQUEL  XR) 200 MG extended release tablet Take 1 tablet by mouth nightly Patient taking 100 mg      omalizumab (XOLAIR) 300 MG/2  ML SOSY injection Inject 600 mg into the skin every 28 days      traZODone  (DESYREL ) 100 MG tablet Take 1 tablet by mouth nightly      promethazine  (PHENERGAN ) 25 MG tablet Take 1 tablet by mouth every 6 hours as needed for Nausea      prochlorperazine  (COMPAZINE ) 10 MG tablet Take 1 tablet by mouth every 6 hours as needed      guanFACINE  (INTUNIV ) 4 MG TB24 extended release tablet Take 1 tablet by mouth daily      Prucalopride  Succinate (MOTEGRITY ) 2 MG TABS Take 2 mg by mouth daily      ondansetron  (ZOFRAN ) 4 MG tablet Take 1 tablet by mouth every 8 hours as needed for Nausea or Vomiting 90 tablet 2    EPINEPHrine  0.3 MG/0.3ML SOSY Inject 0.3 mLs into the muscle 1 (one) time if needed (allergic reaction)      levonorgestrel (MIRENA, 52 MG,) IUD 52 mg 1 each by IntraUTERine route once      [DISCONTINUED] ibuprofen  (ADVIL ;MOTRIN ) 600 MG tablet Take 1 tablet by mouth every 6 hours as needed      [DISCONTINUED] Prucalopride Succinate  2 MG TABS Take 2 mg by mouth daily.      [DISCONTINUED] guanFACINE  (INTUNIV ) 1 MG TB24 extended release tablet Take 1 mg by mouth daily.      [DISCONTINUED] acetaminophen  (TYLENOL ) 500 MG tablet Take 1 tablet by mouth every 6 hours as needed for Pain (breakthrough pain)      [DISCONTINUED] VYVANSE 60 MG CHEW  (Patient not taking: Reported on 01/03/2021)       No facility-administered encounter medications on file as of 01/07/2023.         Allergies   Allergen Reactions    Cow's Milk Anaphylaxis and Hives     Mammal milk protien    Milk Protein Anaphylaxis and Hives     Mammal Milk Protein    Cat Hair Extract Itching    Dog Epithelium (Canis Lupus Familiaris) Itching         Family History   Problem Relation Age of Onset    Cancer Mother         bilateral breast cancer    Hypertension Mother     No Known Problems Father     Lymphoma Maternal Grandmother     Leukemia Maternal Grandfather          Social History     Socioeconomic History    Marital status: Single     Spouse name: None    Number of children: None    Years of education: None  Highest education level: None   Tobacco Use    Smoking status: Never    Smokeless tobacco: Never   Substance and Sexual Activity    Alcohol use: Not Currently    Drug use: Never     Social Determinants of Health     Financial Resource Strain: Low Risk  (11/13/2022)    Received from [facility], [facility]    Financial Resource Strain     Difficulty Paying Living Expenses: Not  hard at all     Difficulty Paying Medical Expenses: No   Food Insecurity: No Food Insecurity (11/13/2022)    Received from [facility], [facility]    Food Insecurity     Worried about Running Out of Food in the Last Year: Never true     Ran Out of Food in the Last Year: Never true   Transportation Needs: No Transportation Needs (11/13/2022)    Received from [facility], [facility]    Transportation Needs     Lack of Transportation: No   Physical Activity: Inactive (11/13/2022)    Received from [facility], [facility]    Physical Activity     Days of Exercise per Week: 0     Minutes of Exercise per Session: 0     Total Minutes of Exercise per Week: 0   Stress: Stress Concern Present (11/13/2022)    Received from [facility], [facility]    Stress     Feeling of Stress : To some extent   Social Connections: Socially Integrated (11/13/2022)    Received from [facility], [facility]    Social Connections     Frequency of Communication with Friends and Family: More than three times a week     Frequency of Social Gatherings with Friends and Family: More than three times a week   Intimate Partner Violence: Not At Risk (11/13/2022)    Received from [facility], [facility]    Intimate Partner Violence     Fear of Current or Ex-Partner: No     Emotionally Abused: No     Physically Abused: No     Sexually Abused: No   Housing Stability: Not At Risk (11/13/2022)    Received from [facility], [facility]    Housing Stability     Was there a time when you did not have a steady place to sleep: No     Worried that the place you are staying is making you sick: No           ROS:  Review of Systems   Constitutional: Negative for chills, fever and weight loss.   HENT: Negative for hearing loss.   Eyes: Negative for blurred vision and double vision.   Respiratory: Negative for cough, hemoptysis and shortness of breath.   Cardiovascular: Negative for chest pain, palpitations and orthopnea.   Gastrointestinal: Negative  for abdominal pain, blood in stool, constipation, diarrhea, nausea and vomiting.   Genitourinary: Negative for dysuria, frequency, hematuria and urgency.   Musculoskeletal: Negative for falls, joint pain and myalgias.   Skin: Negative for itching and rash.   Neurological: Negative for headaches.   Endo/Heme/Allergies: Does not bruise/bleed easily.   Psychiatric/Behavioral: Negative for depression and suicidal ideas. The patient is not nervous/anxious.   All other systems reviewed and are negative.         PHYSICAL EXAM:  BP 110/60   Ht 1.651 m (5' 5)   Wt 58.5 kg (129 lb)   BMI  21.47 kg/m        Physical Exam:  Constitutional: She appears well-developed and well-nourished. No distress.   HENT:    Head: Normocephalic and atraumatic.   Cardiovascular: Regular pulse   Pulmonary/Chest: Effort normal  Skin: She is not diaphoretic.   Psychiatric: She has a normal mood and affect. Her behavior is normal. Thought content normal.   Abdomen:  S/NTTP/ND, no R/G    Counseling:  Patient counseled face to face for more than 50% of the total time spent with the patient.  On this date I have spent 22 minutes reviewing previous notes, test results, and face to face with the patient discussing the diagnosis and importance of compliance with the treatment plan as well as documenting.         ASSESSMENT/PLAN:   22 y.o., No obstetric history on file., who is seen today to discuss DUB. The patient states she did have Mirena IUD placed for DUB ~7 years ago.  She is scheduled to have IUD removed and replaced in OR tomorrow, 01/08/23 by Dr. Clotilda Louder.     Would like to proceed with IUD removal and replacement tomorrow in operating room as scheduled.    -RTO as needed and will schedule annual exam     Diagnosis Orders   1. Encounter for counseling regarding contraception            No problem-specific Assessment & Plan notes found for this encounter.         No orders of the defined types were placed in this  encounter.          Tres Grzywacz K Tam Savoia, APRN - NP

## 2023-01-07 NOTE — Patient Instructions (Signed)
 Have a great day. We will see you at your next visit    Mirna Mires, FNP-C

## 2023-01-08 ENCOUNTER — Inpatient Hospital Stay: Payer: PRIVATE HEALTH INSURANCE | Attending: Gastroenterology

## 2023-01-08 ENCOUNTER — Ambulatory Visit: Attending: Obstetrics & Gynecology

## 2023-01-08 ENCOUNTER — Ambulatory Visit: Admit: 2023-01-08 | Payer: PRIVATE HEALTH INSURANCE

## 2023-01-08 DIAGNOSIS — N938 Other specified abnormal uterine and vaginal bleeding: Secondary | ICD-10-CM

## 2023-01-08 MED ORDER — PROPOFOL 200 MG/20ML IV EMUL
200 | INTRAVENOUS | Status: AC
Start: 2023-01-08 — End: ?

## 2023-01-08 MED ORDER — MIDAZOLAM HCL 2 MG/2ML IJ SOLN
2 | INTRAMUSCULAR | Status: DC | PRN
Start: 2023-01-08 — End: 2023-01-08

## 2023-01-08 MED ORDER — KETOROLAC TROMETHAMINE 15 MG/ML IJ SOLN
15 | Freq: Once | INTRAMUSCULAR | Status: AC
Start: 2023-01-08 — End: 2023-01-08

## 2023-01-08 MED ORDER — SUCCINYLCHOLINE CHLORIDE 20 MG/ML IJ SOLN
20 | INTRAMUSCULAR | Status: DC | PRN
Start: 2023-01-08 — End: 2023-01-08

## 2023-01-08 MED ORDER — GLUCAGON EMERGENCY 1 MG IJ KIT
1 | INTRAMUSCULAR | Status: AC
Start: 2023-01-08 — End: ?

## 2023-01-08 MED ORDER — MIDAZOLAM HCL 2 MG/2ML IJ SOLN
2 | INTRAMUSCULAR | Status: AC
Start: 2023-01-08 — End: ?

## 2023-01-08 MED ORDER — PROCHLORPERAZINE EDISYLATE 10 MG/2ML IJ SOLN
10 | Freq: Once | INTRAMUSCULAR | Status: DC | PRN
Start: 2023-01-08 — End: 2023-01-08

## 2023-01-08 MED ORDER — IOPAMIDOL 76 % IV SOLN
76 | INTRAVENOUS | Status: DC | PRN
Start: 2023-01-08 — End: 2023-01-08

## 2023-01-08 MED ORDER — NORMAL SALINE FLUSH 0.9 % IV SOLN
0.9 | INTRAVENOUS | Status: DC | PRN
Start: 2023-01-08 — End: 2023-01-08

## 2023-01-08 MED ORDER — GLUCAGON EMERGENCY 1 MG IJ KIT
1 | INTRAMUSCULAR | Status: DC | PRN
Start: 2023-01-08 — End: 2023-01-08

## 2023-01-08 MED ORDER — ONDANSETRON HCL 4 MG/2ML IJ SOLN
4 | Freq: Once | INTRAMUSCULAR | Status: AC | PRN
Start: 2023-01-08 — End: 2023-01-08

## 2023-01-08 MED ORDER — SODIUM CHLORIDE 0.9 % IV SOLN
0.9 | INTRAVENOUS | Status: DC | PRN
Start: 2023-01-08 — End: 2023-01-08

## 2023-01-08 MED ORDER — NORMAL SALINE FLUSH 0.9 % IV SOLN
0.9 | Freq: Two times a day (BID) | INTRAVENOUS | Status: DC
Start: 2023-01-08 — End: 2023-01-08

## 2023-01-08 MED ORDER — CEFAZOLIN 2000 MG IN 20 ML SWFI IV SYRINGE (PREMIX)
Status: DC | PRN
Start: 2023-01-08 — End: 2023-01-08
  Administered 2023-01-08: 19:00:00 2 via INTRAVENOUS

## 2023-01-08 MED ORDER — LACTATED RINGERS IV SOLN
INTRAVENOUS | Status: DC
Start: 2023-01-08 — End: 2023-01-08

## 2023-01-08 MED ORDER — PROPOFOL 200 MG/20ML IV EMUL
200 | INTRAVENOUS | Status: DC | PRN
Start: 2023-01-08 — End: 2023-01-08

## 2023-01-08 MED ADMIN — propofol infusion: 50 | INTRAVENOUS | @ 19:00:00 | NDC 63323026922

## 2023-01-08 MED ADMIN — propofol infusion: 50 | INTRAVENOUS | @ 18:00:00 | NDC 63323026922

## 2023-01-08 MED ADMIN — succinylcholine (ANECTINE) injection: 20 | INTRAVENOUS | @ 19:00:00 | NDC 69374030010

## 2023-01-08 MED ADMIN — midazolam (VERSED) injection: 2 | INTRAVENOUS | @ 18:00:00 | NDC 00641620901

## 2023-01-08 MED ADMIN — lactated ringers IV soln infusion: INTRAVENOUS | @ 17:00:00 | NDC 00338011704

## 2023-01-08 MED ADMIN — ondansetron (ZOFRAN) injection 4 mg: 4 mg | INTRAVENOUS | @ 20:00:00 | NDC 23155054831

## 2023-01-08 MED ADMIN — iopamidol (ISOVUE-370) 76 % injection: 100 | @ 19:00:00 | NDC 00270131635

## 2023-01-08 MED ADMIN — lactated ringers IV soln infusion: INTRAVENOUS | @ 19:00:00 | NDC 00990795309

## 2023-01-08 MED ADMIN — Glucagon Emergency KIT: 1 | BUCCAL | @ 19:00:00 | NDC 00548585000

## 2023-01-08 MED ADMIN — ketorolac (TORADOL) injection 30 mg: 30 mg | INTRAVENOUS | @ 20:00:00 | NDC 63323016141

## 2023-01-08 MED FILL — DIPRIVAN 200 MG/20ML IV EMUL: 200 MG/20ML | INTRAVENOUS | Qty: 20

## 2023-01-08 MED FILL — GLUCAGON EMERGENCY 1 MG IJ KIT: 1 MG | INTRAMUSCULAR | Qty: 1

## 2023-01-08 MED FILL — KETOROLAC TROMETHAMINE 15 MG/ML IJ SOLN: 15 MG/ML | INTRAMUSCULAR | Qty: 2

## 2023-01-08 MED FILL — ONDANSETRON HCL 4 MG/2ML IJ SOLN: 4 MG/2ML | INTRAMUSCULAR | Qty: 2

## 2023-01-08 MED FILL — MIDAZOLAM HCL 2 MG/2ML IJ SOLN: 2 MG/ML | INTRAMUSCULAR | Qty: 2

## 2023-01-08 NOTE — Discharge Instructions (Signed)
 MEDICATION INTERACTION:    During your procedure you potentially received a medication or medications which may reduce the effectiveness of oral contraceptives. Please consider other forms of contraception for 1 month following your procedure if you are currently using oral contraceptives as your primary form of birth control. In addition to this, we recommend continuing your oral contraceptive as prescribed, unless otherwise instructed by your physician, during this time.    ACTIVITY  As tolerated and as directed by your doctor.   You may shower in 24 hours. Do not take a bath until cleared by MD.     DIET  Clear liquids until no nausea or vomiting, then light diet for the first day.  Advance to regular diet on second day, unless your doctor orders otherwise.   If nausea and vomiting continues, call your doctor.     PAIN  Take pain medication as directed by your doctor.   Call your doctor if pain is NOT relieved by medication.      CALL YOUR DOCTOR IF   Excessive bleeding that does not stop after holding pressure over the area.  Temperature of 101 degrees F or above.  Excessive redness, swelling or bruising, and/or green or yellow, smelly discharge from incision.    After general anesthesia or intravenous sedation, for 24 hours or while taking prescription Narcotics:  Limit your activities  Some people will feel drowsy or dizzy for up to a few hours after waking up.  A responsible adult needs to be with you for the next 24 hours  Do not drive and operate hazardous machinery  Do not make important personal or business decisions  Do not drink alcoholic beverages  If you have not urinated within 8 hours after discharge, and you are experiencing discomfort from urinary retention, please go to the nearest ED.  If you have sleep apnea and have a CPAP machine, please use it for all naps and sleeping.  Please use caution when taking narcotics and any of your home medications that may cause drowsiness.  *  Please give a  list of your current medications to your Primary Care Provider.  *  Please update this list whenever your medications are discontinued, doses are      changed, or new medications (including over-the-counter products) are added.  *  Please carry medication information at all times in case of emergency situations.    These are general instructions for a healthy lifestyle:  No smoking/ No tobacco products/ Avoid exposure to second hand smoke  Surgeon General's Warning:  Quitting smoking now greatly reduces serious risk to your health.  Obesity, smoking, and sedentary lifestyle greatly increases your risk for illness  A healthy diet, regular physical exercise & weight monitoring are important for maintaining a healthy lifestyle    You may be retaining fluid if you have a history of heart failure or if you experience any of the following symptoms:  Weight gain of 3 pounds or more overnight or 5 pounds in a week, increased swelling in our hands or feet or shortness of breath while lying flat in bed.  Please call your doctor as soon as you notice any of these symptoms; do not wait until your next office visit.

## 2023-01-08 NOTE — Anesthesia Pre Procedure (Signed)
 Department of Anesthesiology  Preprocedure Note       Name:  Robin Mendoza   Age:  22 y.o.  DOB:  02-Nov-2000                                          MRN:  [id number]         Date:  01/08/2023      Surgeon: Clotilde):  Oza, Veeral, MD    Procedure: Procedure(s):  ESOPHAGOGASTRODUODENOSCOPY TUBE INSERTION    Medications prior to admission:   Prior to Admission medications    Medication Sig Start Date End Date Taking? Authorizing Provider   linaclotide  (LINZESS ) 290 MCG CAPS capsule Take 1 capsule by mouth every morning (before breakfast)   Yes [provider]   buPROPion  (WELLBUTRIN  XL) 150 MG extended release tablet Take 1 tablet by mouth every morning   Yes [provider]   QUEtiapine  (SEROQUEL  XR) 200 MG extended release tablet Take 1 tablet by mouth nightly Patient taking 100 mg   Yes [provider]   omalizumab (XOLAIR) 300 MG/2  ML SOSY injection Inject 600 mg into the skin every 28 days   Yes [provider]   traZODone  (DESYREL ) 100 MG tablet Take 1 tablet by mouth nightly   Yes [provider]   promethazine  (PHENERGAN ) 25 MG tablet Take 1 tablet by mouth every 6 hours as needed for Nausea   Yes [provider]   prochlorperazine  (COMPAZINE ) 10 MG tablet Take 1 tablet by mouth every 6 hours as needed   Yes [provider]   guanFACINE  (INTUNIV ) 4 MG TB24 extended release tablet Take 1 tablet by mouth daily   Yes [provider]   Prucalopride Succinate  (MOTEGRITY ) 2 MG TABS Take 2 mg by mouth daily   Yes [provider]   ondansetron  (ZOFRAN ) 4 MG tablet Take 1 tablet by mouth every 8 hours as needed for Nausea or Vomiting 07/03/22  Yes Beaudoin, Celeste B, MD   EPINEPHrine  0.3 MG/0.3ML SOSY Inject 0.3 mLs into the muscle 1 (one) time if needed (allergic reaction)    [provider]   levonorgestrel (MIRENA, 52 MG,) IUD 52 mg 1 each by IntraUTERine route once    [provider]   VYVANSE 60 MG CHEW   09/12/20 01/31/21  [provider]       Current medications:    Current Facility-Administered Medications   Medication Dose Route Frequency Provider Last Rate Last Admin    sodium chloride  flush 0.9 % injection 5-40 mL  5-40 mL IntraVENous 2 times per day Oza, Veeral, MD        sodium chloride  flush 0.9 % injection 5-40 mL  5-40 mL IntraVENous PRN Oza, Veeral, MD        0.9 % sodium chloride  infusion  25 mL IntraVENous PRN Oza, Veeral, MD        lactated ringers  IV soln infusion   IntraVENous Continuous Oza, Veeral, MD 100 mL/hr at 01/08/23 1321 New Bag at 01/08/23 1321       Allergies:    Allergies   Allergen Reactions    Cow's Milk Anaphylaxis and Hives     Mammal milk protien    Milk Protein Anaphylaxis and Hives     Mammal Milk Protein    Cat Hair Extract Itching    Dog Epithelium (Canis Lupus Familiaris)  Itching       Problem List:    Patient Active Problem List   Diagnosis Code    ADHD F90.9    Autism F84.0    Depression F32.A    GERD (gastroesophageal reflux disease) K21.9    Weight loss R63.4    Septic shock (HCC) A41.9, R65.21    Hypokalemia E87.6    Hypophosphatemia E83.39    Hypomagnesemia E83.42    Gastroparesis K31.84    Elevated liver enzymes R74.8    Anemia D64.9    Thrombocytopenia (HCC) D69.6    Intractable nausea and vomiting R11.2    Slow transit constipation K59.01       Past Medical History:        Diagnosis Date    ADHD     managed with meds    Anxiety     Autism     Chronic constipation     GERD (gastroesophageal reflux disease)     Weight loss        Past Surgical History:        Procedure Laterality Date    CHOLECYSTECTOMY, LAPAROSCOPIC N/A 10/05/2020    CHOLECYSTECTOMY LAPAROSCOPIC performed by Toribio CHRISTELLA Cadet, MD at [facility] MAIN OR    COLONOSCOPY N/A 09/17/2020    COLONOSCOPY/BMI 21 performed by Dominick Garnette Mt, MD at [facility] ENDOSCOPY    COLONOSCOPY N/A 01/14/2019    COLONOSCOPY/  BMI 21 PT IS AUTISTIC performed by Dominick Garnette Mt, MD at [facility] ENDOSCOPY    EAR SURGERY Bilateral      as a baby    LAPAROSCOPIC APPENDECTOMY N/A 10/05/2020    APPENDECTOMY LAPAROSCOPIC performed by Toribio CHRISTELLA Cadet, MD at [facility] MAIN OR    UPPER GASTROINTESTINAL ENDOSCOPY         Social History:    Social History     Tobacco Use    Smoking status: Never    Smokeless tobacco: Never   Substance Use Topics    Alcohol use: Not Currently                                Counseling given: Not Answered      Vital Signs (Current):   Vitals:    01/06/23 1436 01/08/23 1302   BP:  (!) 94/55   Pulse:  72   Resp:  15   Temp:  97.8 F (36.6 C)   TempSrc:  Oral   SpO2:  98%   Weight: 56.7 kg (125 lb) 56.7 kg (125 lb)   Height: 1.651 m (5' 5) 1.651 m (5' 5)                                              BP Readings from Last 3 Encounters:   01/08/23 (!) 94/55   01/07/23 110/60   12/26/22 128/78       NPO Status: Time of last liquid consumption: 0630                        Time of last solid consumption: 1930                        Date of last liquid consumption: 01/08/23  Date of last solid food consumption: 01/07/23    BMI:   Wt Readings from Last 3 Encounters:   01/08/23 56.7 kg (125 lb)   01/07/23 58.5 kg (129 lb)   12/26/22 53.1 kg (117 lb)     Body mass index is 20.8 kg/m.    CBC:   Lab Results   Component Value Date/Time    WBC 5.9 08/14/2021 08:48 AM    RBC 4.98 08/14/2021 08:48 AM    HGB 14.3 08/14/2021 08:48 AM    HCT 44.2 08/14/2021 08:48 AM    MCV 88.8 08/14/2021 08:48 AM    RDW 13.2 08/14/2021 08:48 AM    PLT 323 08/14/2021 08:48 AM       CMP:   Lab Results   Component Value Date/Time    NA 140 08/14/2021 08:48 AM    K 3.9 08/14/2021 08:48 AM    CL 108 08/14/2021 08:48 AM    CO2 29 08/14/2021 08:48 AM    BUN 6 08/14/2021 08:48 AM    CREATININE 0.80 08/14/2021 08:48 AM    GFRAA >60 01/31/2021 05:45 AM    AGRATIO 1.1 09/17/2020 01:38 PM    LABGLOM >60 08/14/2021 08:48 AM    GLUCOSE 93 08/14/2021 08:48 AM    CALCIUM 9.8 08/14/2021 08:48 AM    BILITOT 0.4 08/14/2021 08:48 AM    ALKPHOS 131 08/14/2021  08:48 AM    ALKPHOS 82 09/17/2020 01:38 PM    AST 17 08/14/2021 08:48 AM    ALT 21 08/14/2021 08:48 AM       POC Tests: No results for input(s): POCGLU, POCNA, POCK, POCCL, POCBUN, POCHEMO, POCHCT in the last 72 hours.    Coags:   Lab Results   Component Value Date/Time    PROTIME 14.0 02/11/2021 12:39 PM    INR 1.0 02/11/2021 12:39 PM    APTT 29.4 02/11/2021 12:39 PM       HCG (If Applicable):   Lab Results   Component Value Date    PREGTESTUR Negative 10/22/2020        ABGs: No results found for: PHART, PO2ART, PCO2ART, HCO3ART, BEART, O2SATART     Type & Screen (If Applicable):  No results found for: LABABO    Drug/Infectious Status (If Applicable):  Lab Results   Component Value Date/Time    HEPCAB NONREACTIVE 01/28/2021 03:37 AM       COVID-19 Screening (If Applicable):   Lab Results   Component Value Date/Time    COVID19 Not detected 01/26/2021 05:35 AM    COVID19 Not Detected 01/07/2019 12:00 AM           Anesthesia Evaluation  Patient summary reviewed and Nursing notes reviewed  Airway: Mallampati: II  TM distance: >3 FB   Neck ROM: full  Mouth opening: > = 3 FB   Dental: normal exam         Pulmonary:Negative Pulmonary ROS and normal exam                               Cardiovascular:  Exercise tolerance: good (>4 METS)          Rhythm: regular  Rate: normal                    Neuro/Psych:   Negative Neuro/Psych ROS  (+) psychiatric history (ADHD, Autism):            GI/Hepatic/Renal:  ROS comment: Planned GJ tube placement for weight loss, constipation  .   Endo/Other:                      ROS comment: DUB s/p IUD   Abdominal: normal exam            Vascular: negative vascular ROS.         Other Findings:             Anesthesia Plan      TIVA     ASA 2     (Patient interviewed with parents. Mother is Theatre Manager and will be performing IUD replacement- hospital administration approved procedure and appropriate notes and consents obtained. )  Induction:  intravenous.      Anesthetic plan and risks discussed with patient and mother.                        Cullen Lahaie Nuri Vernecia Umble, MD   01/08/2023

## 2023-01-08 NOTE — H&P (Signed)
 Gastroenterology and Interventional Endoscopy Pre-procedure HPI    Date of visit   :  01/08/23      Patient name :  Robin Mendoza  Date of birth  :  August 13, 2000    HPI:    Patient is a 21 y.o. year old female, who has been referred  for PEG-J         ____________________      Past Medical History:  Reviewed, and in prior HPI,documentation    Surgical History:    Reviewed, and in prior HPI,documentation    Medications:    Reviewed, and in prior HPI,documentation    Allergies:  Allergies   Allergen Reactions    Cow's Milk Anaphylaxis and Hives     Mammal milk protien    Milk Protein Anaphylaxis and Hives     Mammal Milk Protein    Cat Hair Extract Itching    Dog Epithelium (Canis Lupus Familiaris) Itching       Social History:    Reviewed, and in prior HPI,documentation    Family History:    Reviewed, and in prior HPI,documentation  Physical Exam:    Vitals:    01/08/23 1302   BP: (!) 94/55   Pulse: 72   Resp: 15   Temp: 97.8 F (36.6 C)   SpO2: 98%     Body mass index is 20.8 kg/m.  @LASTENCWT @      Constitutional: Alert, oriented.  No acute distress  Head: Normocephalic and Atraumatic  Eyes: No icterus, EOMI, no congestion  ENT: No deformity, no hearing loss   Cardiovascular: Regular rate and rhythm, S1-S2 normal, no murmur rub or gallop  Respiratory: Clear to auscultation bilaterally no wheezing rhonchi or crackles  Gastrointestinal:, No hepatosplenomegaly nontender nondistended bowel sounds present in all 4 quadrants  Musculoskeletal: No joint deformity, no swelling in larger joints including knees elbows hands shoulders  Skin: No new rash.  Neurologic: Alert oriented to time place and person , good affect  ____________________    Recommendations:  --Plan for EGD with PEG-J    Arriona Prest M. Kobi Aller, MD  Gastroenterology and Interventional Endoscopy

## 2023-01-08 NOTE — Anesthesia Postprocedure Evaluation (Signed)
 Department of Anesthesiology  Postprocedure Note    Patient: Robin Mendoza  MRN: [id number]  Birthdate: 31-Mar-2001  Date of evaluation: 01/08/2023    Procedure Summary       Date: 01/08/23 Room / Location: [facility] / [facility] ENDOSCOPY    Anesthesia Start: 1415 Anesthesia Stop: 1539    Procedure: ESOPHAGOGASTRODUODENOSCOPY-Gastro Jejunal TUBE INSERTION (Abdomen) Diagnosis:       Slow transit constipation      Weight loss      (Slow transit constipation [K59.01])      (Weight loss [R63.4])    Surgeons: Leora Ham, MD Responsible Provider: Benton Norleen KATHEE MADISON, MD    Anesthesia Type: TIVA ASA Status: 2            Anesthesia Type: No value filed.    Aldrete Phase I: Aldrete Score: 6    Aldrete Phase II: Aldrete Score: 10    Anesthesia Post Evaluation    Patient location during evaluation: PACU  Patient participation: complete - patient cannot participate  Level of consciousness: awake and agitated  Airway patency: patent  Nausea & Vomiting: no nausea and no vomiting  Cardiovascular status: hemodynamically stable  Respiratory status: acceptable, nonlabored ventilation and spontaneous ventilation  Hydration status: euvolemic  Comments: BP 109/70   Pulse (!) 102   Temp 97.8 F (36.6 C) (Temporal)   Resp 18   Ht 1.651 m (5' 5)   Wt 56.7 kg (125 lb)   SpO2 100%   BMI 20.80 kg/m     The patient vomited as she was waking from her propofol  TIVA, and had a short bout of laryngospasm that broke with 20mg  of succ and PPV.  She had no symptoms of aspiration in the PACU, but I told her mom (who is also a physician) about the episode and that she should look out for this.  Multimodal analgesia pain management approach  Pain management: adequate    No notable events documented.

## 2023-01-08 NOTE — Op Note (Signed)
 Procedure: EGD with PEG-J tube placement    Indication: Feeding tube placement  Patient unable to tolerate oral feeding . Severe gastroparesis    Date of Procedure: 01/08/2023    Comorbidities: See the other procedure note for documentation of comorbidities.    Patient profile: Refer to patient note in chart for documentation     Providers: Pablo Soho, MD    Referring: Dr. Tamsen    PCP: Donn Ozell Tonette Mickey., MD    Medicines: MAC    Complication: No immediate complications.     Estimated blood loss: Minimal    Procedure: After the risks including, but not limited to medication reaction, infection, bleeding, perforation, missed lesions, benefits and alternatives of the procedure were discussed with the patient and all questions were answered, informed consent was obtained.  The scope was passed under direct vision throughout the procedure, the patient's blood pressure pulse and oxygen saturation were monitored continuously.  The gastroscope was introduced through the mouth and advanced to the Second part of duodenum.  The Endoscopy was performed without difficulty.  The patient tolerated the procedure well.     Findings:   -The esophagus was normal.  -The stomach was normal.    After completing endoscopic exam PEG tube placement was performed. The [employer] externally removable 24 French PEG tube was placed. The stomach was insufflated with air, transillumination was successfully performed.  The transillumination was in the distal gastric body. The guiding needle was passed from the skin into the stomach successfully under endoscopic visualization without any evidence of bleeding or bowel in the track of the needle. A small 4-79mm skin incision was made. The plastic cannula with a inner needle (trochar introducer needle) was successfully passed into the stomach under direct visualization. The needle was removed. The wire was passed through the introducer catheter and grasped with a snare. The PEG  tube was successfully pulled through. The external marking is at 3.5 cm  . Final position was confirmed with endoscopic visualization, external bumper and the internal bumper is gently touching the skin/mucosa.  --Next, a snare was introduced through the PEG tube and the snare was opened.  The scope was advanced through the snare and the snare was closed around the scope. The scope was then dragged into the small bowel with the snare.  A long 0.035 inch wire was then introduced through the scope into the small bowel.  This was confirmed under fluoroscopy. Next, the snare was opened and the scope was removed over the wire leaving the wire in place. The snare was then carefully pulled back to the level of the PEG and pulled out through the PEG maintaining access.  This resulted in an oral to PEG tube to small bowel access. The oral aspect of the wire was confirmed by gently tugging on the wire, at this juncture the wire was then pulled out from the PEG tube maintaining the small bowel portion of the wire. This resulted in a direct PEG tube to small bowel access. Next a 86 French G-tube was then introduced over the wire and gradually introduced into the small bowel. This was successful.  --Contrast was injected to confirm flow of the J-tube was appropriate.  Small bowel was identified on fluoroscopy.  The procedure was then ended.  -The examined part of duodenum was normal in first and second portion. The duodenum in the 3rd portion had a narrowing, beyond which contrast was not flowing. Hence I advanced the scope into the duodenum  and snared the J tube past the narrowed portion. The anatomy raises possibility of SMA syndrome like anatomy as cause of patients' symptoms. Nonetheless, the mucosa was normal. No stricture was noted at this level.    Impression:  --Normal esophagus, stomach and duodenum  --A 24 French [employer] externally removable Endovive PEG tube was successfully placed, with an associated 12  French J-tube portion.  --Normal examined duodenum. There is a focal narrowing in the 2nd portion of the duodenum    Fluoroscopy: All fluoroscopic images were interpreted by myself and decisions made based on my interpretation of the images.  >10 images were acquired and 5.1 mins of fluoroscopy time was utilized.    Recommendations:  --Okay to use PEG-J tube for meds and water  today.  --Tube feeds in 24 hours. Consult Nutrition services for tube feeds, if necessary.  --Change PEG tube dressing daily for 1 week and after that no dressing required.  --Okay to shower tomorrow however do not immerse the external tube site under water  for a prolonged period of time.  --if patient symptoms improve, consider trial of oral diet and hold PEG-J feeds. Can also consider EUS Gastrojejunostomy to help with symptoms.      Jasleen Riepe M. Adel Neyer, MD  Gastroenterology and Interventional Endoscopy

## 2023-01-08 NOTE — Progress Notes (Signed)
 ..Name: Robin Mendoza  Date: 01/08/2023  Date of Birth: [birth year]  LMP: No LMP recorded. (Menstrual status: IUD).      Robin Mendoza is a 22 y.o. G0 P0 who is here for a  pre procedure H&P .     Robin Mendoza  has no history on file for sexual activity.  Contraception: IUD. , not ever sexually sctive  Menstrual status:  no  Gyn Surgery: none     Women's Health Timeline:  No specialty comments available.      Pap History:  No results found for: DIAG, [id number], HPVGENOREFL     OB History:  OB History    No obstetric history on file.          Medical History:  Past Medical History:  No date: ADHD      Comment:  managed with meds  No date: Anxiety  No date: Autism  No date: Chronic constipation  No date: GERD (gastroesophageal reflux disease)  No date: Weight loss     Surgical History:  Past Surgical History:  10/05/2020: CHOLECYSTECTOMY, LAPAROSCOPIC; N/A      Comment:  CHOLECYSTECTOMY LAPAROSCOPIC performed by Toribio CHRISTELLA Cadet, MD at [facility] MAIN OR  09/17/2020: COLONOSCOPY; N/A      Comment:  COLONOSCOPY/BMI 21 performed by Dominick Garnette Mt,               MD at [facility] ENDOSCOPY  01/14/2019: COLONOSCOPY; N/A      Comment:  COLONOSCOPY/  BMI 21 PT IS AUTISTIC performed by                Dominick Garnette Mt, MD at [facility] ENDOSCOPY  No date: EAR SURGERY; Bilateral      Comment:  as a baby  10/05/2020: LAPAROSCOPIC APPENDECTOMY; N/A      Comment:  APPENDECTOMY LAPAROSCOPIC performed by Toribio CHRISTELLA Cadet,               MD at [facility] MAIN OR  No date: UPPER GASTROINTESTINAL ENDOSCOPY     Meds:  No current outpatient medications on file.    Family Cancer History:   Cancer-related family history includes Cancer in her mother.     Social History:    reports that she has never smoked. She has never used smokeless tobacco. She reports that she does not currently use alcohol. She reports that she does not use drugs.    Review Of Systems:  Review of Systems   Constitutional: Negative.    HENT: Negative.     Eyes:  Negative.    Respiratory: Negative.     Cardiovascular: Negative.    Gastrointestinal: Negative.    Endocrine: Negative.    Genitourinary: Negative.    Musculoskeletal: Negative.    Skin: Negative.    Allergic/Immunologic: Negative.    Hematological: Negative.    Psychiatric/Behavioral: Negative.  Negative for self-injury and suicidal ideas.        PHYSICAL EXAM:  Vitals:  vitals were not taken for this visit.  There is no height or weight on file to calculate BMI.  Physical Exam  Constitutional:       General: She is awake. She is not in acute distress.     Appearance: Normal appearance. She is not ill-appearing.   HENT:      Head: Normocephalic and atraumatic.   Eyes:      Conjunctiva/sclera: Conjunctivae normal.   Cardiovascular:  Rate and Rhythm: Normal rate.   Pulmonary:      Effort: Pulmonary effort is normal.   Musculoskeletal:         General: Normal range of motion.      Cervical back: Normal range of motion.   Skin:     General: Skin is warm and dry.   Neurological:      General: No focal deficit present.      Mental Status: She is alert and oriented to person, place, and time.      Motor: Motor function is intact.      Coordination: Coordination is intact.   Psychiatric:         Behavior: Behavior normal.         Cognition and Memory: Cognition normal.         Judgment: Judgment normal.         Assessment: Robin Mendoza is a  22 y.o. here for  pre procedure visit.    Mirena iud to be removed and replaced in or  Agree with H&P from yesterday.        Clotilda Louder, MD

## 2023-01-08 NOTE — Assessment & Plan Note (Signed)
 Waylon is scheduled to have a Mirena IUD removed (as it is expiring) and replaced in the OR due to autism and high anxiety.  The IUD is needed for DUB, and cycle control NOT BIRTH CONTROL. She has never been sexually active.     Alford Highland, MD

## 2023-01-08 NOTE — Progress Notes (Signed)
 Per Dr. Cato Mulligan:  Dr. Laural Benes to place IUD while patient in procedure room under anesthesia.

## 2023-01-13 MED FILL — XOLAIR 300MG/2ML SOSY: 300 300 MG/2ML | SUBCUTANEOUS | 28 days supply | Qty: 2 | Fill #0 | Status: AC

## 2023-01-13 NOTE — Telephone Encounter (Signed)
 Called pt's mother in response to MyChart message. Notified her that Dr. Oza is out of office this week but will be back 01/19/23. Told pt's mother that we will relay message to him once he is back and update her accordingly on Monday or Tuesday of next week to let her know if he would like to schedule a visit. Pt's mother agreeable to plan. Pt's mother also stated that she wanted Dr. Oza to get in contact with motility specialist in [place] if possible to discuss findings directly. Agreed to pass along request to Dr. Oza.

## 2023-01-13 NOTE — Telephone Encounter (Signed)
 Called pt and mom is going to upload procedure record (procedure done last Thursday) to the portal (I updated Care everywhere and it did not populate). Pt would like to know if the imaging can be done in [place] or should it be done in [place] (as it r/t the expertise of the technicians)?     Rachael King PhD, MSN, RN

## 2023-01-13 NOTE — Telephone Encounter (Signed)
Patients mother is requesting for radiology order to be sent to a [facility] location. Please assist and call back pts mother.

## 2023-01-14 ENCOUNTER — Ambulatory Visit: Payer: PRIVATE HEALTH INSURANCE

## 2023-01-14 NOTE — Progress Notes (Signed)
..  PROCEDURE: IUD removal and IUD Insertion  Contraceptive method:  abstinence, IUD  LMP:  No LMP recorded. (Menstrual status: IUD).  UCG: neg, declined    Patient was counseled regarding 99% efficacy, risk of irregular bleeding for the first 6 months following insertion, small risk or expulsion or uterine perforation with need for surgical removal.  Consent form is signed.  Pt was sedated for GI procedure    Speculum was inserted to visualize the cervix.  String not identified.  Kelly clamp used to enter cervical canal and grasp IUD string. It was easily removed.     Cervix was cleansed with betadine and a single-toothed tenaculum applied to the anterior lip. Mirena IUD was inserted per the manufacturer's instructions.  String was cut at 2 cm from the OS.    Patient tolerated the procedure well.      Robin Louder, MD

## 2023-01-19 ENCOUNTER — Inpatient Hospital Stay: Payer: PRIVATE HEALTH INSURANCE

## 2023-01-20 NOTE — Progress Notes (Signed)
 [facility]  [address]  [place] [address]      Date of Service: 01/20/2023  Primary Care Provider:   Donn Ozell Tonette Mickey., MD       Name: Robin Mendoza  DOB: Aug 31, 2000       Age:22 y.o.      Sex: female      MRN: [id number]       Chief Complaint:Eating Disorder     Subjective   Robin Mendoza is a 22 y.o. 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     Last seen on 8/2 for ARFID/hospital follow up after extensive hospitalization for J-tube placement and nutrition. At that visit she was referred to general surgery for replacement, sweat chloride test and transitioned off Zoloft  and started on Wellbutrin . She was continued on Seroquel  and Trazodone  and referred to adolescent psychiatry.     Sweat Chloride test was indeterminate and was referred to adult CF clinic. They obtained Vit ADE, CMP and want to complete MAP testing. If there is some CFTR dysfunction thought that it may not be enough to cause pulmonary insufficiency.     PEG tube placed ~2 weeks ago by Dr. Oza at [facility]. 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 Gen surgery is following this.     Takes Zofran , Compazine  and Phenergan  to assist with nausea. This has been ongoing since placing the tube. [name] and parent believe nausea and decreased oral intake has increased since tube placement.     She is connected with a motility specialist in [place], [place], and is supposed to have a motility study, however difficulty getting in contact with the office to get that scheduled. Parent asks for us  to assist. Also doing pelvic PT which they believe is helping.     She is followed by GI, has seen the [facility], but would like to see Dr. Cheree. They will be  primary GI team and manage her tube and nutrition.   She does have chronic constipation, taking daily duplex depository with miralax  TID, but does not feel this working.     She has a pending psychiatry eval with Damien 11/06.     Today's complaints, continued if not worsened nausea, decreased oral intake, difficulty with coordination of care. 4 lb weight increase since last visit, >100% IBW      Current ED Related Behaviors:  Restriction    Recent/Current Medical Symptoms:  Bloating and Constipation    Mental Health/Psychotropic Medication Monitoring :      Current Mental Health Concern:  Anxiety and Depression    Prescribed Psychopharm:   Seroquel , Trazodone , and Wellbutrin   Reported Adherence:   Adherent with every dose      SSRI/SNRI Monitoring:  Significant Mood Swings: No  N/V/D: Yes  Sedation: No  Suicidality: No  Homicidality: No  ________________________________________    Weight History    Wt Readings from Last 3 Encounters:   01/20/23 58.2 kg (128 lb 6.4 oz)   01/02/23 56.2 kg (124 lb)   12/23/22 58.6 kg (129 lb 3.2 oz)       Ideal Body Weight (IBW): Ideal body weight: 55.6 kg (122 lb 7.5 oz)  Adjusted ideal body weight: 56.6 kg (124 lb 13.5 oz)    %  IBW: >100%    BMI: Facility age limit for growth %iles is 20 years.    MD Weight Goal:   Weight Maintenance (+/- 2 lbs) Between Visits    Current Care Team  PCP: Dr. Donn  Psychiatrist: Pending 11/06  Dietician: None, will connect with [facility]     [facility]ADD[facility] Assessment <redacted file path>    Screening Tools From Visit:      PHQ Depression Screening     PHQ2 Score: 1    PHQ2/9 is interpreted as Negative in this patient              Current Outpatient Medications   Medication Instructions   . acetaminophen  (TYLENOL ) 1,000 mg, Oral, Every 6 hours PRN   . buPROPion  (WELLBUTRIN  XL) 150 mg, Oral, Every morning   . busPIRone  (BUSPAR ) 10 mg, Oral, 2 times daily   . cholecalciferol (vitamin D3) 5,000 Units, Oral, Daily   . diphenhydrAMINE  (CHILDREN'S BENADRYL  ALLERGY) 50  mg, Oral, Every morning   . EPINEPHrine  (EPIPEN ) 0.3 mg, Intramuscular, Once as needed   . guanFACINE  (INTUNIV  ER) 4 mg, Oral, Daily   . hydrOXYzine (ATARAX) 25 mg, Oral, Every 3 hours PRN   . ibuprofen  (MOTRIN ) 400 mg, Oral, Every 6 hours PRN   . levonorgestreL (MIRENA) 20 mcg/24 hours (7 yrs) 52 mg IUD 1 each, Intra-uterine , Once   . linaCLOtide  (LINZESS ) 290 mcg, Oral, Daily, at least 30 minutes before the first meal of the day on an empty stomach.   . loratadine (CLARITIN) 5 mg, Oral, As needed   . Motegrity  2 mg, Oral, Daily   . omalizumab (XOLAIR) 600 mg, Subcutaneous, Every 28 days   . ondansetron  (ZOFRAN  ODT) 4 mg, Oral, 3 times daily PRN   . ondansetron  (ZOFRAN ) 4 mg, Oral, 3 times daily PRN   . polyethylene glycol (MIRALAX ) 17 g, Oral, 3 times daily   . prochlorperazine  (COMPAZINE ) 10 mg, Oral, 3 times daily PRN   . promethazine  (PHENERGAN ) 25 mg, Rectal, Bedtime PRN   . promethazine  (PHENERGAN ) 25 mg, Oral, Every 6 hours PRN   . QUEtiapine  (SEROQUEL ) 100 mg, Oral, Bedtime   . sodium,potassium,mag sulfates (SUPREP) Per clinic instructions   . traZODone  (DESYREL ) 100 mg, Oral, Bedtime       Please see Chart Record for background information, including Past Medical Hx, Allergies, Family Hx and Surgical Hx.    Review of Systems   Constitutional:  Positive for appetite change. Negative for unexpected weight change.   Gastrointestinal:  Positive for constipation and nausea. Negative for vomiting.   All other systems reviewed and are negative.         Objective     Vitals:    01/20/23 1037   BP: 103/67   Pulse: 76   Temp: 97.3 F (36.3 C)   Weight: 58.2 kg (128 lb 6.4 oz)   Height: 163.5 cm (64.37)     No LMP recorded (lmp unknown). (Menstrual status: IUD Contraception).    Wt Readings from Last 3 Encounters:   01/20/23 58.2 kg (128 lb 6.4 oz)   01/02/23 56.2 kg (124 lb)   12/23/22 58.6 kg (129 lb 3.2 oz)       Physical Exam  Vitals and nursing note reviewed.          Assessment and Plan     Key Action  Plan/Changes from Today's Visit:    Will reach out to primary GI, Dr. Cheree to be primary to manage tube and nutrition.  Would benefit from Home health to help manage tube, we cna place this however will have our office reach out to GI since this should be managed by them.   Will reach out to Dr. Charletta in [place] about GI study. Our office will communicate with family via [facility].  Patients mother to contact Dr. Graig office to follow up on duodenum narrowing.   Patient BMI 21.79, >100% IBW. Not taking anything by mouth, increased vomiting, constipation. Increased opening pressure at tube. I would recommend removing the tube as symptoms have worsened since placing. I would be hesitant to placing another feeding tube due to not being acutely unstable.   Will get EKG to assess QT and labs to assess electrolytes.   Will consider [facility] dietician to manage nutritional in the interim.     Therapeutic drug monitoring (Primary)  -     ECG 12 Lead  -     CBC w/Differential  -     Comprehensive Metabolic Panel (CMP)  -     Lipid Panel  -     Magnesium  (Mg)  -     Phosphorus  -     Vitamin D 25-Hydroxy  -     Vitamin B12        Return for Next scheduled follow up.     Controlled Substance SCRIPTS Check (if applicable):  Checked Today: N/A      Billing Documentation:    I spent a total of 55 minutes, on the date of the visit, personally performing services related to this encounter. The total time includes face-to-face, pre and post service times.

## 2023-01-22 NOTE — Telephone Encounter (Signed)
 Follow-up with April Holding per Dr. Tyler Pita pt. advise request msg from 01/21/23 @ 6:15 pm-LVM

## 2023-01-22 NOTE — Telephone Encounter (Signed)
 MEDICATION REFILL REQUESTED VIA: MyChart    MEDICATION BEING REQUESTED CONFIRMED: Yes    LAST WRITTEN/ REFILLED: 12/05/22  PROVIDER: Other: Dr. Howie    LAST DOSE TAKEN:  unknown    PHARMACY CONFIRMED: Yes    LAST PROVIDER APPOINTMENT: 01/20/23    Future Appointments   Date Time Provider Department Center   02/05/2023  8:00 AM Doren Mays, PA [facility]   03/18/2023  1:00 PM Mordechai Damien Norris, [place] [facility] None   03/20/2023 10:00 AM Donn Ozell Tonette Mickey., MD [facility]   04/03/2023 10:15 AM Lilly Dorn Mars, DO [facility]

## 2023-01-22 NOTE — Telephone Encounter (Signed)
 Responded to mom via patient calls via message pt today 09/12.    Rufina Falco PhD, MSN, RN

## 2023-01-22 NOTE — Progress Notes (Signed)
 MRI defecography ordered for pt

## 2023-01-22 NOTE — Telephone Encounter (Signed)
 Pt mom is requesting a mri order. Thank you!

## 2023-01-23 ENCOUNTER — Encounter

## 2023-01-23 NOTE — Care Coordination (Signed)
 Ambulatory Care Coordination Note     01/23/2023 1:26 PM     Patient Current Location:  Home: [address]  [place] [address]     ACM contacted the parent by telephone. Verified name and DOB with patient as identifiers.         ACM: [name], RN     Challenges to be reviewed by the provider   Additional needs identified to be addressed with provider No  none               Method of communication with provider: none.    Care Summary Note: Telephonic outreach to the patients mother. She reports that the patient is currently at home and has an upcoming appointment in [place] to follow up. The patient had the placement of the Jejunostomy Tube and is recovery well. Per mom, the patient is managing with some struggles. She has an MRI scheduled for November 1.   Mom has no current needs or concerns at this time. Will continue to follow and offer assistance.     Offered patient enrollment in the Remote Patient Monitoring (RPM) program for in-home monitoring: Patient is not eligible for RPM program because: insurance coverage.     Assessments Completed:   No changes since last call    Medications Reviewed:   Patient denies any changes with medications and reports taking all medications as prescribed.    Advance Care Planning:   Reviewed and current     Care Planning:   Not completed during this call    PCP/Specialist follow up:       Follow Up:   Plan for next ACM outreach in approximately 2 weeks to complete:  - additional concerns .   Caregiver is agreeable to this plan.

## 2023-01-29 NOTE — Progress Notes (Signed)
 Noted.

## 2023-01-29 NOTE — Progress Notes (Signed)
 [facility]   F/u Patient Visit     Date: January 29, 2023     Patient: Robin Mendoza Age: 22 y.o.        DOB: 05/23/2000   MRN: [id number] Sex: female       Primary Care Physician: No primary care provider on file. None  None   Cystic fibrosis - Manuael Izquierdo   RSN for referral: constipation    HPI:     Robin Mendoza is a 22 y.o. female presenting with  with ASD -high functining who presents for evaluation of gastroparesis, suspected gastroesophageal reflux disease (GERD), decreased weight, and decreased appetite. She is accompanied by her mother.    Last note:   Recent hospitalization for colonic dysmotility- stool impaction and NG tube placement for golytely  adminitration - up to 3 liters inhouse with enemas etc  Hx Eating disorder- bulimia and has been in eating disorder facilities-     Since last visit she had a sweat chloride test that was high.  She saw pulmonology who sent out cystic fibrosis genetic labs via [facility].  These are still pending.      She had a PEG-J placed due to malnutrition in August.  During the PEG-J placement he noticed duodenal narrowing in the third portion and contrast was not flowing.  Per mother, the advanced endoscopist did not think this was SMA syndrome no stricture was noted at this level.    She had an MRI enterography Juy 2024 - however, she had an NJ at the time - and contrast was placed through the NJ - therefore surpassed the duodenum    Sitz marker study with 4 in her distal ileum, 9 in her ascending colon, tender transverse colon    She recently had an anorectal manometry that showed high sphincter pressures at rest.  Dyssynergy was noted on ARM but balloon expulsion was normal.  She had rectal hyposensitivity.   She went to 1 t session of pelvic floor physical therapy which helped tremendously.  They said that she did not need to go back and that now she is relaxing her internal sphincter    Bowel regimen:  She is using Prucalopride and  linzess  290  Suppostiory everyday - no difference  Miralax  TID through her J tube  She will use 16 ounces of suprep every 10 days  She will get dehydrated with this    She is still nausated - she will take 8 mg zofran  once a day  She will sub phenergan  and compazine   She does vomit    She has  PEG-J - her rate is 45-50, but she will feel nausated.   She does not vent her G  She will eat some food orally   She is on peptide formula   She does not have pancreatic insufficiency.      ROS  14 point review of systems is negative other than noted in HPI    GI Procedures Hx:  ARM 12/2022    Impressions  High sphincter pressures at rest that corrected with time and dyssynergia on ARM but BET was normal. Rectal hyposensitivity noted with blunted sensation and MTV was low.  Consider MR defecagraphy to rule out Dyssenergic defecation and assess structural abnormalities. Consider biofeedback therapy for dyssynergic defecation given high external sphincter pressures.  Of note pt had an outside Sweat Chloride test which was 41 on left and 44 on right reportedly and DNA testing is pending. Not sure if this  is the wide sequencing genetic testing but will ask.     EGD 01/08/2023    Impression:  --Normal esophagus, stomach and duodenum  --A 24 French [employer] externally removable Endovive PEG tube was successfully placed, with an associated 12 French J-tube portion.  --Normal examined duodenum. There is a focal narrowing in the 2nd portion of the duodenum    Imaging:  - Right upper quadrant ultrasound 01/2021:   Unremarkable, no intrahepatic bile duct dilatation.  S/p cholecystectomy.     - CT abdomen pelvis 01/2021:  1. Trace to small pleural effusions in both lung bases.   2. Left upper abdominal small bowel intussusception, most often a transient and self-limited process.   3. Cholecystectomy.      - KUB 12/2020  FINDINGS:   Bowel: No dilated loops of bowel. Moderate-large volume stool burden in the right colon.   Soft  tissues: IUD overlies the right pelvis. Surgical clips in the right upper quadrant.   Bones: The bony skeleton is intact.   IMPRESSION:   Non-obstructive bowel gas pattern. Moderate-large volume stool burden in the right colon.     - CTe 11/08/20  IMPRESSION:   1. No acute inflammatory changes of the most terminal ileum. However, there is a short segment of distal small bowel occurring approximately 3 cm proximal to the ileocecal valve which demonstrate suggested inflammatory changes. Additional inflammatory changes are seen throughout the colon. Although nonspecific, an inflammatory etiology would be favored.   2. Distended small bowel without evidence for significant obstruction at this time.     - CT ABDOMINAL 10/22/20  Impression  1. New nonspecific thickening of the descending and sigmoid colon with  more proximal gaseous colonic distension; findings may be secondary to an  infectious or inflammatory colitis, which includes the possibility of  underlying inflammatory bowel disease.  2. Small volume pelvic free fluid, increased from prior, which may be  reactive  Please note: Our interpretation of studies performed at an outside  institution is limited by factors including absence of technical specifics  of the image, undisclosed clinical information and the unavailability of  the original interpretation. Specialists at the institution that performed  this study may have access to information not available to us  that could  make a difference in the interpretation. We suggest that you obtain the  original interpretation from the site where the study was performed.    - CT A/P w/ contrast 10/22/20 outside   IMPRESSION:   1. Moderate free fluid in the pelvis, nonspecific but may be secondary to   ruptured ovarian cyst.   2. Moderate stool volume in the colon. Apparent mild wall thickening of the   sigmoid colon is most likely due to underdistention.   3. No evidence of colitis, diverticulitis or bowel obstruction. No    hydronephrosis.     - CT Abd pelvis 09/07/20:  IMPRESSION   1. There is pronounced wall thickening of the terminal ileum, with focal cecal wall thickening. There is diffuse colonic distention and mucosal hyperenhancement from the cecum through the rectum, worst at the rectum. This is   a terminal ileitis and diffuse colitis. In a patient of this age, underlying inflammatory bowel disease is likely.   2. There is no evidence of bowel perforation, obstruction, or intraperitoneal   fluid collection.   3. Full features are as detailed above.     - X-ray UGI 08/17/20  FINDINGS: The scout radiograph is normal. The esophagus is normal in  course,   caliber, and mural appearance. No focal strictures or diverticula. There is no   hiatal hernia. Contrast transits the GE junction without delay. There is a   normal rugal fold appearance. The greater and lesser curvatures are intact   without obvious defect. The duodenal bulb and C-sweep of the duodenum are unremarkable. There is normal motility. The patient is able to swallow a barium tablet without delay.   IMPRESSION   Normal upper GI evaluation. No anatomic abnormality appreciated.      Endoscopy:  -Colonoscopy 11/2020:   - The examined portion of the ileum was normal. Biopsied.  - Congested mucosa in the cecum and at the appendiceal orifice. Note recent appendectomy  - Normal mucosa in the proximal rectum, in the mid rectum, in the sigmoid colon, in the descending colon, in the  transverse colon, in the ascending colon and in the cecum.  - Normal mucosa in the ascending colon. Biopsied.  - Normal mucosa in the rectum and in the distal rectum in the area of ulcer . Biopsied.  - A single (solitary) ulcer in the distal rectum. Biopsied. Question etiology, Question stercoral ulcer, prolapse, etc.  no surrounding inflammation,  - Non-bleeding hemorrhoids.  - The examination was otherwise normal.     Pathology: Rectal biopsies with reactive changes and focal lamina propria fibrosis  suggestive of prolapse. No pathologic findings the remainder of the biopsies.       - Colonoscopy 09/2020:   - Normal appearance, however was limited due to poor prep.  No pathologic findings on biopsies.     - EGD 07/2020: (report not available)  - Report not available except for pathology listed below. Per chart review, there was retained food despite patient being NPO for procedure.   - Fragments of benign squamous epithelium with patchy chronic inflammation, features of eosinophilic esophagitis not identified.      - EGD/2021: (report not available)  - Esophageal biopsies with some high-power fields greater than 25 eosinophils.  These numbers of eosinophils, caused by a number of etiologies including reflux esophagitis, although can be associated with eosinophilic esophagitis.     Past Medical History  See above   EOE   GERD  Eating disorder  Anxiety  Constipation and abd pain     Past Medical History  She has a past medical history of Anxiety, Autism, and Gastroparesis.    Surgical History  She has a past surgical history that includes Cholecystectomy; Appendectomy; and PEG tube placement (01/13/2023).     Social History  She reports that she has never smoked. She has never used smokeless tobacco. She reports current alcohol use. She reports that she does not use drugs.    Family History  No family history on file.      Allergies  Milk containing products (dairy), Cat dander, Cat hair standardized allergenic extract, and Dog dander    Objective:     Vitals:    01/29/23 0859   BP: 107/71   Pulse: 103   Temp: 97.9 F (36.6 C)   SpO2: 99%     Body mass index is 21.86 kg/m.     PHYSICAL EXAM:  General: Well-developed, well-nourished, no apparent distress.  Head: Normocephalic, atraumatic.  Eye: Normal conjunctiva, extraocular motion intact, anicteric sclera.  Neck: Trachea midline, supple, no thyromegaly noted.  Cardiovascular: Regular rate, normal rhythm, no murmur/gallop/rub.  Respiratory: Clear to auscultation  bilaterally, normal inspiratory and expiratory effort.  Abdomen: peg-j in place - bumper at 3  cm. No redness or erythema. No pain with palpation. BS active   Extremities: No edema, no gross deformity, no noted trauma.  Neurologic: Awake, alert, and oriented x 3, cranial nerves II-XII grossly intact.    Psychiatric: Cooperative, appropriate affect.    LABS / IMAGING:    No results found for: WBC, HGB, HCT, MCV, PLT    No results found for: INR, PROTIME    No results found for: GLUCOSE, CALCIUM, NA, K, CO2, CL, BUN, CREATININE    No results found for: ALT, AST, GGT, BILITOT    Agile Patency Capsule Procedure  Virgia Kill, MD     01/09/2023  1:15 PM  Agile Patency Capsule Procedure    Date/Time: 12/25/2022 9:00 AM    Performed by: Christiane FORBES Hoehn, RN  Authorized by: Virgia Kill, MD  Preparation: Patient was prepped and   draped in the usual sterile fashion.  Local anesthesia used: no    Anesthesia:  Local anesthesia used: no    Sedation:  Patient sedated: no    Comments: Procedure   Patient anally intubated with solid-state catheter with 10 circumferential   sensors spaced at 0.6 cm apart, positioned to simultaneously span the   entire rectal sphincter, from the anal verge to beyond the border of the   proximal sphincter into the rectum. An additional 2 circumferential   sensors spaced at 0.6 cm apart are positioned inside the balloon beyond   the proximal sphincter border, to measure abdominal pressure activity.   Simultaneously assessed sphincter and abdominal pressure responses to   standard maneuvers, which are resting pressure, squeeze, bear down (push),   RAIR, and compliance (first sensation, urge to defecate, and discomfort   (maximum tolerable volume).      Indications   Hx of constipation, rectal prolapse, urinary incontinence  ASD. Colonic dysmotility but colonoscopy brought from [place] and done   by Dr. Darin Penny shows normal tone, normal exam without  colonic   distension, prep was BBPS=3. No abnormalities found with poor prep done -   7/24  Hx of bulimia  c/f cystic fibrosis - ordered sweat chloride test. 44         Interpretation / Findings   DRE with normal tone and  squeeze nd otherwise normal. Normal anocutaneous   reflex.  1. High resting pressure at first but she was able to relax and normal   squeeze pressure and normal sustained squeeze with spasms noted. Anal   hypertension  with normal anal contractility   2. Abnormal anorectal   co-coordination with poor propulsion and paradoxical contractions noted on   push and rectal prolapse was seen. 3. RAIR is present. 4. Normal BET at 51   seconds.  5. Cough reflex is normal at rectal pressure of 33 mmHg  and   anal pressure of 149 mmHg . 6. Blunted sensation at 70, normal urge at   100. Hypersenstivity at 140 . Rectal prolapse is seen.      Impressions  High sphincter pressures at rest that corrected with time and   dyssynergia on ARM but BET was normal. Rectal hyposensitivity noted with   blunted sensation and MTV was low.  Consider MR defecagraphy to rule out   Dyssenergic defecation and assess structural abnormalities. Consider   biofeedback therapy for dyssynergic defecation given high external   sphincter pressures.  Of note pt had an outside Sweat Chloride test which was 41 on left and 44   on right reportedly and DNA testing  is pending. Not sure if this is the   wide sequencing genetic testing but will ask.       Wanita Kill MD, Director of Motility  Elyn Morris NP            Current Outpatient Medications   Medication Instructions   . acetaminophen  (TYLENOL ) 1,000 mg, oral, Every 6 hours PRN   . albuterol HFA (PROVENTIL HFA;VENTOLIN HFA;PROAIR HFA) 90 mcg/actuation inhaler 2 puffs, inhalation, Every 4 hours PRN   . BisaCODYL  (DULCOLAX) 10 mg, rectal, Daily   . buPROPion  (WELLBUTRIN  XL) 150 mg, oral, Daily 0800   . busPIRone  (BUSPAR ) 10 mg, oral   . calcium carbonate-vitamin D3 (OYSTER SHELL) 250  mg-3.125 mcg (125 unit) tab per tablet 1 tablet, oral   . diphenhydrAMINE  50 mg, oral, Every morning   . EPINEPHrine  (EPIPEN ) 0.3 mg/0.3 mL injection syringe    . guanFACINE  (INTUNIV ) 4 mg, oral, Daily   . hydrOXYzine (ATARAX) 50 mg tablet    . levonorgestreL (MIRENA) 21 mcg/24 hr (8 yrs) 52 mg IUD 1 each, intrauterine   . linaCLOtide  (LINZESS ) 290 mcg, oral, Every 24 hours   . LORazepam  (ATIVAN ) 0.5 mg tablet    . Motegrity  2 mg, oral, Daily   . ondansetron  (ZOFRAN ) 4 mg, oral, Every 8 hours PRN   . ondansetron  (ZOFRAN ) 8 mg, oral, Every 8 hours PRN   . prochlorperazine  (COMPAZINE ) 10 mg, oral, Every 6 hours PRN   . promethazine  (PHENERGAN ) 50 mg tablet    . promethazine  (PHENERGAN ) 25 mg, rectal   . QUEtiapine  (SEROQUEL ) 100 mg, oral   . sertraline  (ZOLOFT ) 50 mg, Daily   . simethicone  125 mg tab 1 tablet, oral, Every 4 hours PRN   . traZODone  (DESYREL ) 100 mg, oral, At bedtime   . Xolair 300 mg/2 mL syrg syringe         Assessment / Plan:     Niasha Devins is a 22 y.o. female presenting with  with ASD -high functining who presents for evaluation of gastroparesis, suspected gastroesophageal reflux disease (GERD), decreased weight, and decreased appetite. She is accompanied by her mother.    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  She was diagnosed with gastroparesis via upper endoscopy.  She never had a gastric emptying scan - I do not think she could tolerate egg sandwhich at this time.  She had a sitz marker study with 4 findings in her distal ileum, 9 in her ascending colon, 10 in her transverse colon.     Constipation - slow transit    She had a sitz marker study with 4 findings in her distal ileum, 9 in her ascending colon, 10 in her transverse colon.  Anorectal manometry recently done that showed high resting sphincter tones, however she was able to expel the balloon.  We ordered an MR defecography however this was not done yet - it is scheduled for November.  She is currently on 290 Linzess  along  with Motegrity .  She takes Suprep every 10 days which provides her relief.  She also take suppositories daily.  She went to 1 session of pelvic floor physical therapy and states that she did not need to come back  PLAN  -Add ibsrela  50 mg BID. Can take this with linzess  and motegrity   -Await MR defecography to rule out structural issues  - she was able to expel the balloon, so if a subtotal colectomy  is needed in the future, she does not need an ostomy bag  -  stop miralax  as this worsens bloating   - stop suprep as this causes dehydration  - continue suppositories nightly  - she needs to return to pelvic floor PT for a full 6 sessions     Increased Risk of Cystic Fibrosis esp w hx rectal prolapse   She had an abnormal sweat test.  She currently has a cystic fibrosis genetic testing pending.  This was sent to [facility].  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  She is currently feeding through the J.  She still has some nausea and vomiting daily.  She is not venting through the G    She is also complaining that the PEG-J is leaking from the tube feeding site.  She had a 24 french removable endo vive placed - She does not have an ENFit.  Spent some time online looking at options -she can ask her to gastro for an enfit adapter.  We do not have these in clinic.  This might help with leaking, since the rubber on the tube does not stay closed.  She also bought a clamp on [employer] which will be beneficial to help with leaking. Her site looks great - no concern for infection.     Duodenal narrowing noted on recent EGD  Duodenal narrowing was noted in the third portion of the duodenum.  Contrast was not flowing per report.  She does have a history of MR enterography back in July 2024.  However this was done through [place] while she was hospitalized therefore it surpassed the duodenum.  Did not show pathologic  distention or stricturing.  -She has an upper GI series ordered for tomorrow.  She will let us  know these results.  They are to place the contrast through the G-tube so we can assess the entire small bowel  - if there is an obstruction or narrowing - will order an MRE and angio together. If there is no obstruction - this is a pseudo-obstruction     She is to discuss next steps AFTER her cystic fibrosis tests and MR def. Are back. We need reports and data prior to next steps. She has a general GI in [place] interm. She will send us  results     On the day of the visit I spent 75 minutes preparing to see the patient, obtaining and/or reviewing separately obtained history, performing a medically appropriate examination and evaluation, counseling and educating the patient/caregiver, ordering medications, test, or procedures, referring and communicating with other health care professionals (not separately reported), and documenting clinical information in the electronic medical record.This time does not include any time spent performing procedures or assesments that are separately billable.       Elyn Morris, FNP

## 2023-01-30 ENCOUNTER — Inpatient Hospital Stay: Admit: 2023-01-30 | Payer: PRIVATE HEALTH INSURANCE | Attending: Gastroenterology

## 2023-01-30 DIAGNOSIS — Z931 Gastrostomy status: Secondary | ICD-10-CM

## 2023-01-30 MED ORDER — DIATRIZOATE MEGLUMINE & SODIUM 66-10 % PO SOLN
66-10 | Freq: Once | ORAL | Status: AC | PRN
Start: 2023-01-30 — End: ?
  Administered 2023-01-30: 17:00:00 170 mL via JEJUNOSTOMY

## 2023-01-30 NOTE — Telephone Encounter (Signed)
 Patient would like to be scheduled with Dr. Herbert Spires and not the PA. Please call pt's mom to see if she can be rescheduled.

## 2023-01-30 NOTE — Progress Notes (Signed)
 Completing PA for [name] and [employer]

## 2023-02-03 MED FILL — XOLAIR 300MG/2ML SOSY: 300 300 MG/2ML | SUBCUTANEOUS | 28 days supply | Qty: 2 | Fill #1 | Status: AC

## 2023-02-03 NOTE — Telephone Encounter (Signed)
 Spoke to mom [place] regarding concerns after [name]'s Upper GI from 9/20. Ms. Gethers would like to speak with Dr. Oza to review results and to plan for further care in conjunction with care plan from Dr. Gaylin. Mom states 5 cm obstruction was noted to be in the duodenum. Wants to discuss possible MRI enterography.    Mother also wants to know if PEG J tube was placed intraluminal or extraluminal due to leakage of tube. She would also like to know if there is a cap available that can help prevent leakage.     Advised Ms. Spiker I will reach out to Dr. Oza with her questions and concerns and touch bases back with her as soon as he replies and or have time to review [name]'s results with next recommendations. Ms. Clotilda understood but would like to speak with Dr. Oza himself.  Advised that messages would be forward to Dr. Oza.      Eileen

## 2023-02-04 ENCOUNTER — Inpatient Hospital Stay
Admit: 2023-02-04 | Discharge: 2023-02-11 | Disposition: A | Discharge status: Home / Self Care | Payer: PRIVATE HEALTH INSURANCE

## 2023-02-04 ENCOUNTER — Encounter

## 2023-02-04 DIAGNOSIS — K9423 Gastrostomy malfunction: Secondary | ICD-10-CM

## 2023-02-04 DIAGNOSIS — R112 Nausea with vomiting, unspecified: Secondary | ICD-10-CM

## 2023-02-04 NOTE — Telephone Encounter (Signed)
 Per Dr. Percell Miller, order placed for CT ABD with IV and PO contrast to assess for small bowel obstruction/PEG J.

## 2023-02-04 NOTE — H&P (Signed)
 Hospitalist History and Physical   Admit Date:  02/04/2023  7:40 PM   Name:  Robin Mendoza   Age:  22 y.o.  Sex:  female  DOB:  [birth year]   MRN:  [id number]   Room:  629/01    Presenting/Chief Complaint: No chief complaint on file.     Reason(s) for Admission: PEG tube malfunction (HCC) [K94.23]     History of Present Illness:   Robin Mendoza is a 22 y.o. female with medical history of IBS with slow transit constipation (cystic fibrosis genetic testing pending) status post PEG tube placement in August 2024, ADHD, depression/anxiety, GERD who is currently being admitted directly from outpatient as per Dr. Graig request in view of intractable nausea vomiting and due to concerns of possible obstruction.  Patient was instructed to come into [facility] [place] to get CT abdomen pelvis with IV and oral contrast to evaluate for possible obstruction.  Patient does report of having abdominal cramping with significant nausea vomiting.  No reported fever, chills, shortness of breath, chest pain, palpitations, dizziness, lightheadedness, urinary symptoms.  Of note: Patient has had an extensive workup at [facility] and [facility] for weight loss and poor oral intake following which she was referred to Dr. Oza for PEG-J tube placement.      Assessment & Plan:     Principal Problem:    PEG tube malfunction (HCC)    Intractable nausea and vomiting  Plan: 9/25-  Admit to inpatient in view of PEG tube malfunction due to possible blockage that needs to be ruled out.  Order for CT abdomen pelvis with IV and oral contrast to be given through PEG tube.  Patient will be n.p.o. for now.  Will be on IV fluids D5 half NS at 75 cc/h.  Dr. Oza has been consulted, will evaluate patient in AM.  As needed Zofran , Compazine  and Phenergan  for nausea vomiting ordered.    Active Problems:    ADHD    Autism    Depression  Plan: Resumed Wellbutrin , Seroquel , trazodone .      GERD (gastroesophageal reflux disease)  Plan: Pepcid   20 mg IV twice daily.      Slow transit constipation  Plan: Resumed Linzess , Motegrity  and tenapanor .      PT/OT evals ordered?  Therapy evals ordered  Diet: Diet NPO  VTE prophylaxis: Lovenox   Code status: Full Code      Non-peripheral Lines and Tubes (if present):             Hospital Problems:  Principal Problem:    PEG tube malfunction (HCC)  Active Problems:    ADHD    Autism    Depression    GERD (gastroesophageal reflux disease)    Intractable nausea and vomiting    Slow transit constipation  Resolved Problems:    * No resolved hospital problems. *        Objective:   No data found.         Estimated body mass index is 20.8 kg/m as calculated from the following:    Height as of 01/08/23: 1.651 m (5' 5).    Weight as of this encounter: 56.7 kg (125 lb).  No intake or output data in the 24 hours ending 02/04/23 2026      Physical Exam:    General:    Young female, alert and awake, NAD, on room air  Head:  Normocephalic, atraumatic  Eyes:  Sclerae appear normal.  Pupils equally round.  ENT:  Nares appear normal.  Moist oral mucosa  Neck:  No restricted ROM.  Trachea midline   CV:   RRR.  No m/r/g.  No jugular venous distension.  Lungs:   CTAB.  No wheezing, rhonchi, or rales.  Symmetric expansion.  Abdomen:   Soft, nontender, nondistended.  PEG tube in place, insertion site clean without drainage.  Extremities: No cyanosis or clubbing.  No edema  Skin:     No rashes.  Normal coloration.   Warm and dry.    Neuro:  CN II-XII grossly intact.  Sensation intact.   Psych:  Normal mood and affect.      Orders Placed This Encounter   Medications    buPROPion  (WELLBUTRIN  XL) extended release tablet 150 mg    traZODone  (DESYREL ) tablet 100 mg    QUEtiapine  (SEROQUEL ) tablet 100 mg    sodium chloride  flush 0.9 % injection 5-40 mL    sodium chloride  flush 0.9 % injection 5-40 mL    0.9 % sodium chloride  infusion    OR Linked Order Group     potassium chloride  (KLOR-CON  M) extended release tablet 40 mEq     potassium  bicarb-citric acid  (EFFER-K ) effervescent tablet 40 mEq     potassium chloride  10 mEq/100 mL IVPB (Peripheral Line)    magnesium  sulfate 2000 mg in 50 mL IVPB premix    enoxaparin  (LOVENOX ) injection 40 mg     Order Specific Question:   Indication of Use     Answer:   Prophylaxis-DVT/PE    polyethylene glycol (GLYCOLAX ) packet 17 g    OR Linked Order Group     acetaminophen  (TYLENOL ) tablet 650 mg     acetaminophen  (TYLENOL ) suppository 650 mg    dextrose  5 % and 0.45 % sodium chloride  infusion    melatonin tablet 3 mg    sennosides-docusate sodium  (SENOKOT-S) 8.6-50 MG tablet 1 tablet    ondansetron  (ZOFRAN ) injection 4 mg    famotidine  (PEPCID ) 20 mg in sodium chloride  (PF) 0.9 % 10 mL injection    prochlorperazine  (COMPAZINE ) injection 5 mg    promethazine  (PHENERGAN ) tablet 25 mg       Prior to Admit Medications:  Current Outpatient Medications   Medication Instructions    buPROPion  (WELLBUTRIN  XL) 150 mg, Oral, EVERY MORNING    EPINEPHrine  (SYMJEPI ) 0.3 mg, IntraMUSCular, Once as needed    guanFACINE  (INTUNIV ) 4 mg, Oral, DAILY    levonorgestrel (MIRENA, 52 MG,) IUD 52 mg 1 each, IntraUTERine, ONCE    Linzess  290 mcg, Oral, DAILY BEFORE BREAKFAST    Motegrity  2 mg, Oral, DAILY    ondansetron  (ZOFRAN ) 4 mg, Oral, EVERY 8 HOURS PRN    prochlorperazine  (COMPAZINE ) 10 mg, Oral, EVERY 6 HOURS PRN    promethazine  (PHENERGAN ) 25 mg, Oral, EVERY 6 HOURS PRN    QUEtiapine  (SEROQUEL  XR) 200 mg, Oral, NIGHTLY, Patient taking 100 mg    traZODone  (DESYREL ) 100 mg, Oral, NIGHTLY    Xolair 600 mg, SubCUTAneous, EVERY 28 DAYS       I have personally reviewed labs and tests:  No results found for this or any previous visit (from the past 24 hour(s)).    No results for input(s): COVID19 in the last 72 hours.    No results found.      Signed:  Brenna Creighton, MD    Part of this note may have been written by using a voice dictation software.  The note has been proof read but may still  contain some grammatical/other  typographical errors.

## 2023-02-04 NOTE — ICUwatch (Signed)
 [facility] Clinical Rounding Nurse Assistance    Called to assist primary RN with  IV start and PEG tube assistance.    US  Guided PIV access- placed by Hartzog, MD. Ultrasound was used to find the vein which was compressible and without any ultrasound features of an artery or nerve bundle. Skin was cleaned and disinfected prior to IV puncture.  Under real-time ultrasound guidance peripheral access was obtained in the left forearm using 20 G 1.75 Peripheral IV catheter. Blood return was present and IV flushed without difficulty with no clinical signs of infiltration. IV dressing applied and no immediate complications noted. Patient tolerated the procedure well.     PEG leaking around cap, new ENFit cap provided.     Hobert Lunger, RN  [place]: [phone]  [place]: [phone]

## 2023-02-04 NOTE — Telephone Encounter (Signed)
 At 4:15 pm; pt's mother called; has not heard from GI Provider.  Reached out to office manager who asked GI Provider's RN to reach out to his cell phone to remind him to call pt's mother ASAP.

## 2023-02-04 NOTE — Telephone Encounter (Signed)
 Received phone call from Dr. Oza. Per Dr. Oza, he has spoken over the phone with pt's mother. Pt's mother has decided that they are going to attempt to have pt admitted to hospital today. Outpatient CT no longer needed per provider based on conversation with pt's mother. No further orders at this time.

## 2023-02-04 NOTE — Telephone Encounter (Signed)
 Received message back from Dr. Percell Miller at 4:22pm stating that he would plan to contact pt's mother as requested at [phone].

## 2023-02-04 NOTE — Procedures (Signed)
 PROCEDURE NOTE  Date: 02/05/2023   Name: Robin Mendoza  Date of Birth: 2000/08/13    Procedures  Called to patient's room to establish IV access.  Attempts by nursing staff unsuccessful.  A timeout was called. Patient was identified by wristband and verbal consent consent obtained.  The proper insertion site was chosen and marked (left forearm).  Insertion site was prepped and cleaned in the usual aseptic technique using ChloraPrep which was allowed to dry.  A 20-gauge 1.75 in catheter was inserted into the vessel under ultrasound guidance. The catheter was advanced over the needle.  The needle was then removed and needle safety device engaged.  Good blood return was noted.  The catheter was secured in place and catheter flushed without infiltration or other difficulties.  Placement was confirmed by ultrasound with 10cc saline flush.

## 2023-02-04 NOTE — Telephone Encounter (Signed)
 Pt's mother called; needs to speak with GI provider as soon as possible.   [phone].

## 2023-02-05 LAB — CBC WITH AUTO DIFFERENTIAL
Basophils %: 1 % (ref 0.0–2.0)
Basophils %: 1 % (ref 0.0–2.0)
Basophils Absolute: 0.1 10*3/uL (ref 0.0–0.2)
Basophils Absolute: 0 10*3/uL (ref 0.0–0.2)
Eosinophils %: 8 % — ABNORMAL HIGH (ref 0.5–7.8)
Eosinophils %: 9 % — ABNORMAL HIGH (ref 0.5–7.8)
Eosinophils Absolute: 0.6 10*3/uL (ref 0.0–0.8)
Eosinophils Absolute: 0.5 10*3/uL (ref 0.0–0.8)
Hematocrit: 31.4 % — ABNORMAL LOW (ref 35.8–46.3)
Hematocrit: 32.1 % — ABNORMAL LOW (ref 35.8–46.3)
Hemoglobin: 10 g/dL — ABNORMAL LOW (ref 11.7–15.4)
Hemoglobin: 10.5 g/dL — ABNORMAL LOW (ref 11.7–15.4)
Immature Granulocytes %: 0 % (ref 0.0–5.0)
Immature Granulocytes %: 0 % (ref 0.0–5.0)
Immature Granulocytes Absolute: 0 10*3/uL (ref 0.0–0.5)
Immature Granulocytes Absolute: 0 10*3/uL (ref 0.0–0.5)
Lymphocytes %: 26 % (ref 13–44)
Lymphocytes %: 30 % (ref 13–44)
Lymphocytes Absolute: 1.8 10*3/uL (ref 0.5–4.6)
Lymphocytes Absolute: 1.9 10*3/uL (ref 0.5–4.6)
MCH: 28.6 pg (ref 26.1–32.9)
MCH: 29.2 pg (ref 26.1–32.9)
MCHC: 31.8 g/dL (ref 31.4–35.0)
MCHC: 32.7 g/dL (ref 31.4–35.0)
MCV: 89.7 FL (ref 82–102)
MCV: 89.4 FL (ref 82–102)
MPV: 9.7 FL (ref 9.4–12.3)
MPV: 9.7 FL (ref 9.4–12.3)
Monocytes %: 10 % (ref 4.0–12.0)
Monocytes %: 12 % (ref 4.0–12.0)
Monocytes Absolute: 0.7 10*3/uL (ref 0.1–1.3)
Monocytes Absolute: 0.8 10*3/uL (ref 0.1–1.3)
Neutrophils %: 55 % (ref 43–78)
Neutrophils %: 48 % (ref 43–78)
Neutrophils Absolute: 3.8 10*3/uL (ref 1.7–8.2)
Neutrophils Absolute: 3.1 10*3/uL (ref 1.7–8.2)
Platelets: 260 10*3/uL (ref 150–450)
Platelets: 250 10*3/uL (ref 150–450)
RBC: 3.5 M/uL — ABNORMAL LOW (ref 4.05–5.2)
RBC: 3.59 M/uL — ABNORMAL LOW (ref 4.05–5.2)
RDW: 13.7 % (ref 11.9–14.6)
RDW: 13.3 % (ref 11.9–14.6)
WBC: 7 10*3/uL (ref 4.3–11.1)
WBC: 6.3 10*3/uL (ref 4.3–11.1)
nRBC: 0 10*3/uL (ref 0.0–0.2)
nRBC: 0 10*3/uL (ref 0.0–0.2)

## 2023-02-05 LAB — COMPREHENSIVE METABOLIC PANEL W/ REFLEX TO MG FOR LOW K
ALT: 10 U/L (ref 8–45)
ALT: 8 U/L (ref 8–45)
AST: 26 U/L (ref 15–37)
AST: 20 U/L (ref 15–37)
Albumin/Globulin Ratio: 1 (ref 1.0–1.9)
Albumin/Globulin Ratio: 1.1 (ref 1.0–1.9)
Albumin: 3.2 g/dL — ABNORMAL LOW (ref 3.5–5.0)
Albumin: 3.1 g/dL — ABNORMAL LOW (ref 3.5–5.0)
Alk Phosphatase: 96 U/L (ref 35–104)
Alk Phosphatase: 93 U/L (ref 35–104)
Anion Gap: 10 mmol/L (ref 9–18)
Anion Gap: 9 mmol/L (ref 9–18)
BUN: 5 mg/dL — ABNORMAL LOW (ref 6–23)
BUN: 4 mg/dL — ABNORMAL LOW (ref 6–23)
CO2: 22 mmol/L (ref 20–28)
CO2: 23 mmol/L (ref 20–28)
Calcium: 8.6 mg/dL — ABNORMAL LOW (ref 8.8–10.2)
Calcium: 8.8 mg/dL (ref 8.8–10.2)
Chloride: 107 mmol/L (ref 98–107)
Chloride: 107 mmol/L (ref 98–107)
Creatinine: 0.69 mg/dL (ref 0.60–1.10)
Creatinine: 0.68 mg/dL (ref 0.60–1.10)
Est, Glom Filt Rate: >90 mL/min/{1.73_m2} (ref >60)
Est, Glom Filt Rate: >90 mL/min/{1.73_m2} (ref >60)
Globulin: 3 g/dL (ref 2.3–3.5)
Globulin: 2.9 g/dL (ref 2.3–3.5)
Glucose: 85 mg/dL (ref 70–99)
Glucose: 97 mg/dL (ref 70–99)
Potassium: 3.5 mmol/L (ref 3.5–5.1)
Potassium: 3.4 mmol/L — ABNORMAL LOW (ref 3.5–5.1)
Sodium: 139 mmol/L (ref 136–145)
Sodium: 139 mmol/L (ref 136–145)
Total Bilirubin: 0.2 mg/dL (ref 0.0–1.2)
Total Bilirubin: 0.2 mg/dL (ref 0.0–1.2)
Total Protein: 6.2 g/dL — ABNORMAL LOW (ref 6.3–8.2)
Total Protein: 6 g/dL — ABNORMAL LOW (ref 6.3–8.2)

## 2023-02-05 LAB — MAGNESIUM
Magnesium: 1.9 mg/dL (ref 1.8–2.4)
Magnesium: 1.9 mg/dL (ref 1.8–2.4)

## 2023-02-05 MED ORDER — BARIUM SULFATE 0.1 % PO SUSP
0.1 | Freq: Once | ORAL | Status: AC | PRN
Start: 2023-02-05 — End: 2023-02-05

## 2023-02-05 MED ORDER — LACTULOSE 10 GM/15ML PO SOLN
10 | Freq: Once | ORAL | Status: DC
Start: 2023-02-05 — End: 2023-02-05

## 2023-02-05 MED ORDER — SODIUM CHLORIDE 0.9 % IV SOLN
0.9 | INTRAVENOUS | Status: DC | PRN
Start: 2023-02-05 — End: 2023-02-11

## 2023-02-05 MED ORDER — PROMETHAZINE HCL 25 MG PO TABS
25 | Freq: Four times a day (QID) | ORAL | Status: DC | PRN
Start: 2023-02-05 — End: 2023-02-11

## 2023-02-05 MED ORDER — PROCHLORPERAZINE EDISYLATE 10 MG/2ML IJ SOLN
10 | Freq: Four times a day (QID) | INTRAMUSCULAR | Status: DC | PRN
Start: 2023-02-05 — End: 2023-02-11

## 2023-02-05 MED ORDER — LORAZEPAM 2 MG/ML IJ SOLN
2 MG/ML | Freq: Four times a day (QID) | INTRAMUSCULAR | Status: DC | PRN
Start: 2023-02-05 — End: 2023-02-05

## 2023-02-05 MED ORDER — IOPAMIDOL 76 % IV SOLN
76 | Freq: Once | INTRAVENOUS | Status: AC | PRN
Start: 2023-02-05 — End: 2023-02-05

## 2023-02-05 MED ORDER — POTASSIUM BICARB-CITRIC ACID 20 MEQ PO TBEF
20 | ORAL | Status: DC | PRN
Start: 2023-02-05 — End: 2023-02-11

## 2023-02-05 MED ORDER — LINACLOTIDE 290 MCG PO CAPS
290 | Freq: Every day | ORAL | Status: DC
Start: 2023-02-05 — End: 2023-02-11

## 2023-02-05 MED ORDER — NORMAL SALINE FLUSH 0.9 % IV SOLN
0.9 | INTRAVENOUS | Status: DC | PRN
Start: 2023-02-05 — End: 2023-02-11

## 2023-02-05 MED ORDER — MELATONIN 3 MG PO TABS
3 | Freq: Every evening | ORAL | Status: DC
Start: 2023-02-05 — End: 2023-02-11

## 2023-02-05 MED ORDER — ACETAMINOPHEN 650 MG RE SUPP
650 | Freq: Four times a day (QID) | RECTAL | Status: DC | PRN
Start: 2023-02-05 — End: 2023-02-11

## 2023-02-05 MED ORDER — NORMAL SALINE FLUSH 0.9 % IV SOLN
0.9 | Freq: Two times a day (BID) | INTRAVENOUS | Status: DC
Start: 2023-02-05 — End: 2023-02-11

## 2023-02-05 MED ORDER — BISACODYL 10 MG RE SUPP
10 | Freq: Every day | RECTAL | Status: AC
Start: 2023-02-05 — End: 2023-02-08

## 2023-02-05 MED ORDER — LORAZEPAM 2 MG/ML IJ SOLN
2 MG/ML | Freq: Four times a day (QID) | INTRAMUSCULAR | Status: DC | PRN
Start: 2023-02-05 — End: 2023-02-11

## 2023-02-05 MED ORDER — PROMETHAZINE HCL 25 MG PO TABS
25 | Freq: Four times a day (QID) | ORAL | Status: DC | PRN
Start: 2023-02-05 — End: 2023-02-04

## 2023-02-05 MED ORDER — QUETIAPINE FUMARATE 100 MG PO TABS
100 | Freq: Every evening | ORAL | Status: DC
Start: 2023-02-05 — End: 2023-02-11

## 2023-02-05 MED ORDER — SENNA-DOCUSATE SODIUM 8.6-50 MG PO TABS
8.6-50 | Freq: Two times a day (BID) | ORAL | Status: DC
Start: 2023-02-05 — End: 2023-02-11

## 2023-02-05 MED ORDER — DIATRIZOATE MEGLUMINE & SODIUM 66-10 % PO SOLN
66-10 | Freq: Once | ORAL | Status: DC | PRN
Start: 2023-02-05 — End: 2023-02-11

## 2023-02-05 MED ORDER — DEXTROSE-SODIUM CHLORIDE 5-0.45 % IV SOLN
5-0.45 | INTRAVENOUS | Status: AC
Start: 2023-02-05 — End: 2023-02-06

## 2023-02-05 MED ORDER — POTASSIUM CHLORIDE CRYS ER 20 MEQ PO TBCR
20 | ORAL | Status: DC | PRN
Start: 2023-02-05 — End: 2023-02-11

## 2023-02-05 MED ORDER — TRAZODONE HCL 50 MG PO TABS
50 | Freq: Every evening | ORAL | Status: DC
Start: 2023-02-05 — End: 2023-02-11

## 2023-02-05 MED ORDER — BUSPIRONE HCL 10 MG PO TABS
10 | Freq: Two times a day (BID) | ORAL | Status: DC
Start: 2023-02-05 — End: 2023-02-11

## 2023-02-05 MED ORDER — SODIUM CHLORIDE (PF) 0.9 % IJ SOLN
0.9 | Freq: Two times a day (BID) | INTRAMUSCULAR | Status: DC
Start: 2023-02-05 — End: 2023-02-11

## 2023-02-05 MED ORDER — ENOXAPARIN SODIUM 40 MG/0.4ML IJ SOSY
40 | INTRAMUSCULAR | Status: DC
Start: 2023-02-05 — End: 2023-02-11

## 2023-02-05 MED ORDER — MAGNESIUM SULFATE 2000 MG/50 ML IVPB PREMIX
2 | INTRAVENOUS | Status: DC | PRN
Start: 2023-02-05 — End: 2023-02-11

## 2023-02-05 MED ORDER — POTASSIUM CHLORIDE 10 MEQ/100ML IV SOLN
10 | INTRAVENOUS | Status: DC | PRN
Start: 2023-02-05 — End: 2023-02-11

## 2023-02-05 MED ORDER — GUANFACINE HCL ER 4 MG PO TB24
4 | Freq: Every day | ORAL | Status: DC
Start: 2023-02-05 — End: 2023-02-11

## 2023-02-05 MED ORDER — PRUCALOPRIDE SUCCINATE 2 MG PO TABS
2 | Freq: Every day | ORAL | Status: DC
Start: 2023-02-05 — End: 2023-02-11

## 2023-02-05 MED ORDER — TENAPANOR HCL 50 MG PO TABS
50 | Freq: Two times a day (BID) | ORAL | Status: DC
Start: 2023-02-05 — End: 2023-02-11

## 2023-02-05 MED ORDER — BUPROPION HCL ER (XL) 150 MG PO TB24
150 | Freq: Every day | ORAL | Status: DC
Start: 2023-02-05 — End: 2023-02-11

## 2023-02-05 MED ORDER — ONDANSETRON HCL 4 MG/2ML IJ SOLN
4 | INTRAMUSCULAR | Status: DC | PRN
Start: 2023-02-05 — End: 2023-02-11

## 2023-02-05 MED ORDER — POLYETHYLENE GLYCOL 3350 17 G PO PACK
17 | Freq: Every day | ORAL | Status: DC | PRN
Start: 2023-02-05 — End: 2023-02-11

## 2023-02-05 MED ORDER — ACETAMINOPHEN 325 MG PO TABS
325 | Freq: Four times a day (QID) | ORAL | Status: DC | PRN
Start: 2023-02-05 — End: 2023-02-11

## 2023-02-05 MED ORDER — PEG 3350-KCL-NABCB-NACL-NASULF 236 G PO SOLR
236 | Freq: Once | ORAL | Status: AC
Start: 2023-02-05 — End: 2023-02-05

## 2023-02-05 MED ORDER — KETOROLAC TROMETHAMINE 30 MG/ML IJ SOLN
30 | Freq: Four times a day (QID) | INTRAMUSCULAR | Status: AC | PRN
Start: 2023-02-05 — End: 2023-02-09

## 2023-02-05 MED ADMIN — barium sulfate (VOLUMEN) 0.1 % suspension 450 mL: 450 mL | ORAL | @ 23:00:00 | NDC 32909092703

## 2023-02-05 MED ADMIN — ondansetron (ZOFRAN) injection 4 mg: 4 mg | INTRAVENOUS | @ 13:00:00 | NDC 23155054831

## 2023-02-05 MED ADMIN — dextrose 5 % and 0.45 % sodium chloride infusion: INTRAVENOUS | @ 03:00:00 | NDC 00338008504

## 2023-02-05 MED ADMIN — prochlorperazine (COMPAZINE) injection 5 mg: 5 mg | INTRAVENOUS | @ 04:00:00 | NDC 00641613501

## 2023-02-05 MED ADMIN — diatrizoate meglumine-sodium (GASTROGRAFIN) 66-10 % solution 15 mL: 15 mL | ORAL | @ 04:00:00 | NDC 00270044535

## 2023-02-05 MED ADMIN — prochlorperazine (COMPAZINE) injection 5 mg: 5 mg | INTRAVENOUS | @ 16:00:00 | NDC 00641613501

## 2023-02-05 MED ADMIN — QUEtiapine (SEROQUEL) tablet 100 mg: 100 mg | ORAL | @ 03:00:00 | NDC 67877025001

## 2023-02-05 MED ADMIN — LORazepam (ATIVAN) 0.5 mg in sodium chloride (PF) 0.9 % 10 mL injection: 0.5 mg | INTRAVENOUS | @ 21:00:00 | NDC 63323018601

## 2023-02-05 MED ADMIN — potassium chloride (KLOR-CON M) extended release tablet 40 mEq: 40 meq | ORAL | @ 13:00:00 | NDC 00904729361

## 2023-02-05 MED ADMIN — potassium bicarb-citric acid (EFFER-K) effervescent tablet 40 mEq: 40 meq | ORAL | @ 16:00:00 | NDC 51801001101

## 2023-02-05 MED ADMIN — Prucalopride Succinate (MOTEGRITY) tablet 2 mg (patient supplied) (Patient Supplied): 2 mg | ORAL | @ 13:00:00 | NDC 54092054701

## 2023-02-05 MED ADMIN — polyethylene glycol (GoLYTELY) solution 4,000 mL: 4000 mL | GASTROSTOMY | @ 18:00:00 | NDC 43386009019

## 2023-02-05 MED ADMIN — iopamidol (ISOVUE-370) 76 % injection 100 mL: 100 mL | INTRAVENOUS | @ 23:00:00 | NDC 00270131635

## 2023-02-05 MED ADMIN — buPROPion (WELLBUTRIN XL) extended release tablet 150 mg: 150 mg | ORAL | @ 13:00:00 | NDC 68180031909

## 2023-02-05 MED ADMIN — traZODone (DESYREL) tablet 100 mg: 100 mg | ORAL | @ 03:00:00 | NDC 00904686861

## 2023-02-05 MED ADMIN — linaclotide (LINZESS) capsule 290 mcg (patient supplied) (Patient Supplied): 290 ug | ORAL | @ 13:00:00 | NDC 00456120230

## 2023-02-05 MED ADMIN — LORazepam (ATIVAN) 1 mg in sodium chloride (PF) 0.9 % 10 mL injection: 1 mg | INTRAVENOUS | @ 21:00:00 | NDC 63323018601

## 2023-02-05 MED ADMIN — ondansetron (ZOFRAN) injection 4 mg: 4 mg | INTRAVENOUS | @ 19:00:00 | NDC 23155054831

## 2023-02-05 MED ADMIN — bisacodyl (DULCOLAX) suppository 10 mg: 10 mg | RECTAL | @ 13:00:00 | NDC 00574705012

## 2023-02-05 MED ADMIN — iopamidol (ISOVUE-370) 76 % injection 100 mL: 100 mL | INTRAVENOUS | @ 05:00:00 | NDC 00270131635

## 2023-02-05 MED ADMIN — busPIRone (BUSPAR) tablet 10 mg: 10 mg | ORAL | @ 04:00:00 | NDC 00904712161

## 2023-02-05 MED ADMIN — sodium chloride flush 0.9 % injection 5-40 mL: 10 mL | INTRAVENOUS | @ 03:00:00

## 2023-02-05 MED ADMIN — promethazine (PHENERGAN) tablet 50 mg: 50 mg | GASTROSTOMY | @ 19:00:00 | NDC 00904730461

## 2023-02-05 MED ADMIN — famotidine (PEPCID) 20 mg in sodium chloride (PF) 0.9 % 10 mL injection: 20 mg | INTRAVENOUS | @ 13:00:00 | NDC 63323073911

## 2023-02-05 MED ADMIN — busPIRone (BUSPAR) tablet 10 mg: 10 mg | ORAL | @ 13:00:00 | NDC 00904712161

## 2023-02-05 MED ADMIN — sennosides-docusate sodium (SENOKOT-S) 8.6-50 MG tablet 1 tablet: 1 | GASTROSTOMY | @ 13:00:00 | NDC 00536124801

## 2023-02-05 MED ADMIN — promethazine (PHENERGAN) tablet 50 mg: 50 mg | GASTROSTOMY | @ 03:00:00 | NDC 00904730461

## 2023-02-05 MED FILL — QUETIAPINE FUMARATE 100 MG PO TABS: 100 MG | ORAL | Qty: 1

## 2023-02-05 MED FILL — TRAZODONE HCL 50 MG PO TABS: 50 MG | ORAL | Qty: 2

## 2023-02-05 MED FILL — BUSPIRONE HCL 10 MG PO TABS: 10 MG | ORAL | Qty: 1

## 2023-02-05 MED FILL — LORAZEPAM 2 MG/ML IJ SOLN: 2 MG/ML | INTRAMUSCULAR | Qty: 1

## 2023-02-05 MED FILL — SENNOSIDES-DOCUSATE SODIUM 8.6-50 MG PO TABS: ORAL | Qty: 1

## 2023-02-05 MED FILL — EFFER-K 20 MEQ PO TBEF: 20 MEQ | ORAL | Qty: 2

## 2023-02-05 MED FILL — PROCHLORPERAZINE EDISYLATE 10 MG/2ML IJ SOLN: 10 MG/2ML | INTRAMUSCULAR | Qty: 2

## 2023-02-05 MED FILL — POTASSIUM CHLORIDE CRYS ER 20 MEQ PO TBCR: 20 MEQ | ORAL | Qty: 2

## 2023-02-05 MED FILL — FAMOTIDINE (PF) 20 MG/2ML IV SOLN: 20 MG/2ML | INTRAVENOUS | Qty: 2

## 2023-02-05 MED FILL — GASTROGRAFIN 66-10 % PO SOLN: 66-10 % | ORAL | Qty: 30

## 2023-02-05 MED FILL — PROMETHAZINE HCL 25 MG PO TABS: 25 MG | ORAL | Qty: 1

## 2023-02-05 MED FILL — BUPROPION HCL ER (XL) 150 MG PO TB24: 150 MG | ORAL | Qty: 1

## 2023-02-05 MED FILL — MELATONIN 3 MG PO TABS: 3 MG | ORAL | Qty: 1

## 2023-02-05 MED FILL — ONDANSETRON HCL 4 MG/2ML IJ SOLN: 4 MG/2ML | INTRAMUSCULAR | Qty: 2

## 2023-02-05 MED FILL — LOVENOX 40 MG/0.4ML IJ SOSY: 40 MG/0.4ML | INTRAMUSCULAR | Qty: 0.4

## 2023-02-05 MED FILL — BISACODYL 10 MG RE SUPP: 10 MG | RECTAL | Qty: 1

## 2023-02-05 MED FILL — SODIUM CHLORIDE (PF) 0.9 % IJ SOLN: 0.9 % | INTRAMUSCULAR | Qty: 10

## 2023-02-05 MED FILL — PROMETHAZINE HCL 25 MG PO TABS: 25 MG | ORAL | Qty: 2

## 2023-02-05 MED FILL — GAVILYTE-G 236 G PO SOLR: 236 g | ORAL | Qty: 4000

## 2023-02-05 NOTE — Consults (Addendum)
 Comprehensive Nutrition Assessment    Type and Reason for Visit: Initial, Positive Nutrition Screen, Consult  Malnutrition Screening Tool: Malnutrition Screen  Have you recently lost weight without trying?: 14 to 23 pounds (2 points)  Have you been eating poorly because of a decreased appetite?: Yes (1 point)  Malnutrition Screening Tool Score: 3, Tube Feeding Management (Hospitalists), and Best Practice Alert for Current Home EN/PN    Nutrition Recommendations/Plan:   Continue current diet.    Discussed with GI, PA EN can be started per home regimen s/p CT if desired.    Nutrition support labs ordered for tomorrow am.    PRN phos replacement added.      Malnutrition Assessment:  Malnutrition Status: At risk for malnutrition (Comment) (GI history, varied tolerance of enteral and oral regimen)  NFPE unremarkable     Nutrition Assessment:  Nutrition History:   Received PPN during admission at [facility] in July d/t constipation and inability to infuse adequate enteral nutrition or sustain volitional oral intake.    01/08/23: GJ placed by Dr. Oza.  At home pt has an extensive bowel regime, uses Compleat Peptide Plant Based with various infusion typically between 40-50 ml/hr for 3 carton infusion.  She infuses water  with liquid IV during the day.  She is able to tolerate a higher rate of infusion of fluid up to 75 ml/hr.  She continues to eat orally with highly variable intake of a vegan diet with preference for sweets.       Do You Have Any Cultural, Religious, or Ethnic Food Preferences?: Yes (Comment) (Vegan)   Weight History: Hx of prior wt loss more consistently at 125# recently per EMR review, confirmed by pt and mom.    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    Admitted from Dr. Graig office intractable nausea and vomiting with concerns for possible  GI obstruction.    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    Nutrition Interval:  GI following plan as follows:  CT this admission (reviewed by Dr. Oza as well) did not show any abnormalities of the PEG-J. She has significant stool burden on R side of colon with mild wall thickening. She had a hx of colonic inertia; follows with motlity specialist in [facility].   CTA + CT enterography with PO contrast for further evaluation of duodenal narrowing and to have available for next visit with them.     Visit with pt and mom at bedside.  Starting contrast after RD visit for CT.  Oral intake today <15% needs, she has been unable to complete golytelty thus far has only administered one syringe with return of some product.  Mom has one carton of compleat peptide here.  Pt is anxious about contrast.  Discussed with Rollene, RN.      Abdominal Status (last documented by nursing):    , GI Symptoms: Nausea   Pertinent Medications: dulcolax suppository, pepcid , linzess , motegrity , senokot  Continuous: none  IVF: D5 0.45%NS @ 100ml/hr  Electrolyte Replacement:  9/26: 40 meq effer k, 40 meq klor con  Pertinent administered PRN: gastrograffin (9/25), zofran  (9/26), compazine  (9/26)  Pertinent Labs:   Lab Results   Component Value Date/Time    NA 139 02/05/2023 05:36 AM    K 3.4 02/05/2023 05:36 AM  CL 107 02/05/2023 05:36 AM    CO2 23 02/05/2023 05:36 AM    BUN 4 02/05/2023 05:36 AM    CREATININE 0.68 02/05/2023 05:36 AM    GLUCOSE 97 02/05/2023 05:36 AM    CALCIUM 8.8 02/05/2023 05:36 AM    PHOS 3.9 08/14/2021 08:48 AM    MG 1.9 02/05/2023 05:36 AM     Hemoglobin A1C   Date Value Ref Range Status   01/16/2021 5.1 4.8 - 5.6 % Final     Remarkable for: mild hypokalemia (replaced)      Current Nutrition Therapies:  ADULT DIET; Regular; Lactose-Controlled, Vegetarian (Lacto-Ovo)    Current Intake:   Average Meal Intake: 1-25% (primarily snack items)         Anthropometric Measures:  Height: 165.1 cm (5' 5)  Current Body Wt: 56.7 kg (125 lb) (9/25), Weight source: Stated  BMI: 20.8, Normal Weight (BMI 18.5-24.9)  Admission Body Weight: 56.7 kg (125 lb) (stated)  Ideal Body Weight (Kg) (Calculated): 57 kg (125 lbs), 100 %  BMI Category Normal Weight (BMI 18.5-24.9)  Estimated Daily Nutrient Needs:  Energy (kcal/day): 1418-1701 (25-30 kcal/kg) (Kcal/kg Weight used: 56.7 kg Admission  Protein (g/day): 57-68 (1-1.2 g.kg) Weight Used: (Admission) 56.7 kg  Fluid (ml/day):   (1 ml/kcal)    Nutrition Diagnosis:   Inadequate oral intake related to altered GI function (PEG/J for primary needs) as evidenced by    Nutrition Interventions:   Food and/or Nutrient Delivery: Continue Current Diet (TF per home regimen can start after CT ent per GI)     Coordination of Nutrition Care: Interdisciplinary Rounds      Goals:      Active Goal: Meet at least 75% of estimated needs, by next RD assessment       Nutrition Monitoring and Evaluation:      Food/Nutrient Intake Outcomes: Food and Nutrient Intake, Enteral Nutrition Intake/Tolerance  Physical Signs/Symptoms Outcomes: Biochemical Data, GI Status, Fluid Status or Edema, Meal Time Behavior, Weight    Discharge Planning:    Enteral Nutrition    Sherwin Hollingshed T, RD

## 2023-02-05 NOTE — Consults (Signed)
 Consult Note            Date:02/05/2023        Patient Name:Robin Mendoza     Date of Birth:03-20-2001     Age:22 y.o.    Consult to Gastroenterology  Consult performed by: Truitt Clos, PA  Consult ordered by: Arne Cordon, MD          Chief Complaint   No chief complaint on file.       History Obtained From   mother, electronic medical record    History of Present Illness   Patient is a 22 year old female, with a hx of Autism, GERD, slow transit constipation, gastroparesis, PEG-J for poor nutrition who was admitted for nausea, vomiting with concern for obstruction and PEG abnormality. She had the PEG-J placed back in August 29 with Dr. Oza for nausea/vomiting and severe gastroparesis. Noted to have some narrowing at D3 with no contrast flowing; possibility of SMA, though able to advance scope and no mucosal issues. There were concerns for PEG-J blockage on 9/20, in which she had UGI through PEG-J, which showed normal flow of contrast into duodenum, though a moderate filling defect in distal duodenum possible from intra-luminal hematoma, bezoar, foreign body.     GI consulted as patient with worsening nausea/vomiting after PEG-J was placed. No leakage from the PEG-J per mom, but patient does have vomiting after feeding. She does taken PO intake, such as smoothies, noodles. No vomiting today and she is asking for something to eat/drink. Her CT this admission (reviewed by Dr. Oza as well) did not show any abnormalities of the PEG-J. She has significant stool burden on R side of colon with mild wall thickening. She had a hx of colonic inertia; follows with motlity specialist in [facility]. Is currently being tested for cystic fibrosis; talks of colectomy in the future if CF study is negative. Has not had a bowel movement in more than a week; is passing gas. Has tried multiple doses of miralax  daily, suprep, bowel preps, without much relief. She is on both Linzess  and Motegrity  (initially helping).      Mom states that [facility] is requesting MR enterography with contrast PO for further evaluation of the duodenal narrowing if possible; prefers to be inpatient if available. Spoke with Dr. Oza and okay to order, though MR enterography not available per MRI department. CT enterography is available. Awaiting callback from Elyn Morris, NP at [facility], per mom's request for further insight into MRE vs CT enterography.     Labs: Hgb 10.5 (14 in April); labs otherwise unremarkable.    Imaging: CT ABDOMEN PELVIS W IV CONTRAST Additional Contrast? Radiologist Recommendation (Both oral and IV contrast)    Result Date: 02/05/2023  1.  There is a gastrojejunostomy tube.  The tip of the tube is within the jejunum. 2.  Significant stool burden noted within the right colon.  Relative decompression of the descending colon with diffuse mild wall thickening versus decompression.  No significant pericolonic inflammatory fat stranding.  Cannot exclude colitis.  Correlate clinically.  IUD noted. 3.  Small bilateral pleural fluid collections.     FL Upper GI W Barium Swallow Kub    Result Date: 01/30/2023  1. The above-described findings may be related to the presence of an intra luminal hematoma/bezoar/foreign body and less likely a tumor in the second portion of the duodenum     Medications: Senna, Phenergan , Compazine , Miralax  prn, Dulcolax, Linzess , Motegrity .    Past Medical  History     Past Medical History:   Diagnosis Date    ADHD     managed with meds    Anxiety     Autism     Chronic constipation     GERD (gastroesophageal reflux disease)     Weight loss         Past Surgical History     Past Surgical History:   Procedure Laterality Date    CHOLECYSTECTOMY, LAPAROSCOPIC N/A 10/05/2020    CHOLECYSTECTOMY LAPAROSCOPIC performed by Toribio CHRISTELLA Cadet, MD at [facility] MAIN OR    COLONOSCOPY N/A 09/17/2020    COLONOSCOPY/BMI 21 performed by Dominick Garnette Mt, MD at [facility] ENDOSCOPY    COLONOSCOPY N/A 01/14/2019    COLONOSCOPY/  BMI  21 PT IS AUTISTIC performed by Dominick Garnette Mt, MD at [facility] ENDOSCOPY    EAR SURGERY Bilateral     as a baby    LAPAROSCOPIC APPENDECTOMY N/A 10/05/2020    APPENDECTOMY LAPAROSCOPIC performed by Toribio CHRISTELLA Cadet, MD at [facility] MAIN OR    UPPER GASTROINTESTINAL ENDOSCOPY      UPPER GASTROINTESTINAL ENDOSCOPY N/A 01/08/2023    ESOPHAGOGASTRODUODENOSCOPY-Gastro Jejunal TUBE INSERTION performed by Oza, Veeral, MD at [facility] ENDOSCOPY        Medications     Prior to Admission medications    Medication Sig Start Date End Date Taking? Authorizing Provider   busPIRone  (BUSPAR ) 10 MG tablet Take 1 tablet by mouth 2 times daily   Yes [provider]   Cholecalciferol (VITAMIN D3) 125 MCG (5000 UT) TABS Take 125 tablets by mouth daily   Yes [provider]   Tenapanor  HCl (IBSRELA ) 50 MG TABS Take 50 tablets by mouth 2 times daily   Yes [provider]   bisacodyl  (DULCOLAX) 10 MG suppository Place 1 suppository rectally daily   Yes [provider]   Nutritional Supplements (COMPLEAT ORIGINAL PLANT-BASED) LIQD by Enteral route   Yes [provider]   linaclotide  (LINZESS ) 290 MCG CAPS capsule Take 1 capsule by mouth every morning (before breakfast)   Yes [provider]   buPROPion  (WELLBUTRIN  XL) 150 MG extended release tablet Take 1 tablet by mouth every morning   Yes [provider]   QUEtiapine  (SEROQUEL  XR) 200 MG extended release tablet Take 1 tablet by mouth nightly Patient taking 100 mg   Yes [provider]   omalizumab (XOLAIR) 300 MG/2  ML SOSY injection Inject 600 mg into the skin every 28 days   Yes [provider]   traZODone  (DESYREL ) 100 MG tablet Take 1 tablet by mouth nightly   Yes [provider]   promethazine  (PHENERGAN ) 25 MG tablet Take 1 tablet by mouth every 6 hours as needed for Nausea   Yes [provider]   prochlorperazine  (COMPAZINE ) 10 MG tablet Take 1 tablet by mouth every 6 hours as needed   Yes  [provider]   guanFACINE  (INTUNIV ) 4 MG TB24 extended release tablet Take 1 tablet by mouth daily   Yes [provider]   Prucalopride Succinate  (MOTEGRITY ) 2 MG TABS Take 1 tablet by mouth daily   Yes [provider]   ondansetron  (ZOFRAN ) 4 MG tablet Take 1 tablet by mouth every 8 hours as needed for Nausea or Vomiting 07/03/22  Yes Beaudoin, Sherran B, MD   EPINEPHrine  0.3 MG/0.3ML SOSY Inject 0.3 mLs into the muscle 1 (one) time if needed (allergic reaction)   Yes [provider]   levonorgestrel (  MIRENA, 52 MG,) IUD 52 mg 1 each by IntraUTERine route once   Yes [provider]   VYVANSE 60 MG CHEW  09/12/20 01/31/21  [provider]        [START ON 02/06/2023] guanFACINE  (INTUNIV ) extended release tablet 4 mg (Patient Supplied), Daily  polyethylene glycol (GoLYTELY ) solution 4,000 mL, Once  buPROPion  (WELLBUTRIN  XL) extended release tablet 150 mg, Daily  traZODone  (DESYREL ) tablet 100 mg, Nightly  QUEtiapine  (SEROQUEL ) tablet 100 mg, Nightly  sodium chloride  flush 0.9 % injection 5-40 mL, 2 times per day  sodium chloride  flush 0.9 % injection 5-40 mL, PRN  0.9 % sodium chloride  infusion, PRN  potassium chloride  (KLOR-CON  M) extended release tablet 40 mEq, PRN   Or  potassium bicarb-citric acid  (EFFER-K ) effervescent tablet 40 mEq, PRN   Or  potassium chloride  10 mEq/100 mL IVPB (Peripheral Line), PRN  magnesium  sulfate 2000 mg in 50 mL IVPB premix, PRN  enoxaparin  (LOVENOX ) injection 40 mg, Q24H  polyethylene glycol (GLYCOLAX ) packet 17 g, Daily PRN  acetaminophen  (TYLENOL ) tablet 650 mg, Q6H PRN   Or  acetaminophen  (TYLENOL ) suppository 650 mg, Q6H PRN  dextrose  5 % and 0.45 % sodium chloride  infusion, Continuous  melatonin tablet 3 mg, Nightly  sennosides-docusate sodium  (SENOKOT-S) 8.6-50 MG tablet 1 tablet, BID  ondansetron  (ZOFRAN ) injection 4 mg, Q4H PRN  famotidine  (PEPCID ) 20 mg in sodium chloride  (PF) 0.9 % 10 mL injection, BID  prochlorperazine   (COMPAZINE ) injection 5 mg, Q6H PRN  linaclotide  (LINZESS ) capsule 290 mcg (patient supplied) (Patient Supplied), QAM AC  Tenapanor  HCl TABS 50 mg (patient supplied) (Patient Supplied), BID  Prucalopride Succinate  (MOTEGRITY ) tablet 2 mg (patient supplied) (Patient Supplied), Daily  bisacodyl  (DULCOLAX) suppository 10 mg, Daily  LORazepam  (ATIVAN ) 1 mg in sodium chloride  (PF) 0.9 % 10 mL injection, Q6H PRN  ketorolac  (TORADOL ) injection 30 mg, Q6H PRN  busPIRone  (BUSPAR ) tablet 10 mg, BID  promethazine  (PHENERGAN ) tablet 50 mg, Q6H PRN  diatrizoate  meglumine -sodium (GASTROGRAFIN ) 66-10 % solution 15 mL, ONCE PRN        Allergies   Cow's milk, Lactose, Lactulose , Milk protein, Cat hair extract, and Dog epithelium (canis lupus familiaris)    Social History     Social History       Tobacco History       Smoking Status  Never      Smokeless Tobacco Use  Never              Alcohol History       Alcohol Use Status  Not Currently              Drug Use       Drug Use Status  Never              Sexual Activity       Sexually Active  Not Asked                    Family History     Family History   Problem Relation Age of Onset    Cancer Mother         bilateral breast cancer    Hypertension Mother     No Known Problems Father     Lymphoma Maternal Grandmother     Leukemia Maternal Grandfather        Review of Systems   Review of Systems   Gastrointestinal:  Positive for constipation, nausea and vomiting. Negative for abdominal pain, anal bleeding, blood in  stool and diarrhea.   All other systems reviewed and are negative.       Physical Exam   BP (!) 98/56   Pulse 77   Temp 98.2 F (36.8 C) (Oral)   Resp 14   Ht 1.651 m (5' 5)   Wt 56.7 kg (125 lb)   SpO2 100%   BMI 20.80 kg/m      Physical Exam  Vitals and nursing note reviewed.   Constitutional:       Appearance: Normal appearance.   HENT:      Head: Normocephalic and atraumatic.   Eyes:      Conjunctiva/sclera: Conjunctivae normal.   Cardiovascular:      Rate and  Rhythm: Normal rate and regular rhythm.   Pulmonary:      Effort: Pulmonary effort is normal.      Breath sounds: Normal breath sounds.   Abdominal:      General: Abdomen is flat. Bowel sounds are normal. There is no distension.      Palpations: Abdomen is soft.      Tenderness: There is abdominal tenderness (Mild LUQ/LLQ pain.). There is no guarding or rebound.      Comments: PEG-J in place; no obvious leaking, irritation, discharge   Neurological:      General: No focal deficit present.      Mental Status: She is alert.   Psychiatric:         Mood and Affect: Mood normal.         Labs    CBC:  Recent Labs     02/04/23  2039 02/05/23  0536   WBC 7.0 6.3   RBC 3.50* 3.59*   HGB 10.0* 10.5*   HCT 31.4* 32.1*   MCV 89.7 89.4   RDW 13.7 13.3   PLT 260 250     CHEMISTRIES:  Recent Labs     02/04/23  2039 02/05/23  0536   NA 139 139   K 3.5 3.4*   CL 107 107   CO2 22 23   BUN 5* 4*   CREATININE 0.69 0.68   GLUCOSE 85 97   MG 1.9 1.9     PT/INR:No results for input(s): PROTIME, INR in the last 72 hours.  APTT:No results for input(s): APTT in the last 72 hours.  LIVER PROFILE:  Recent Labs     02/04/23  2039 02/05/23  0536   AST 26 20   ALT 10 8   BILITOT 0.2 0.2   ALKPHOS 96 93       Imaging/Diagnostics   CT ABDOMEN PELVIS W IV CONTRAST Additional Contrast? Radiologist Recommendation (Both oral and IV contrast)    Result Date: 02/05/2023  1.  There is a gastrojejunostomy tube.  The tip of the tube is within the jejunum. 2.  Significant stool burden noted within the right colon.  Relative decompression of the descending colon with diffuse mild wall thickening versus decompression.  No significant pericolonic inflammatory fat stranding.  Cannot exclude colitis.  Correlate clinically.  IUD noted. 3.  Small bilateral pleural fluid collections. Automatic exposure control was used as a dose lowering technique. Radiation Dose: CTDI is 9.63 mGy. DLP is 565.86 mGy-cm. Contrast Type: iopamidol  (ISOVUE -370) 76 . Contrast  Volume: 100 mL Report signed on 02/05/2023 (02:57 Eastern Time) Signed by: Lonni Hampson,M.D. Reading Location: [id number]    FL Upper GI W Barium Swallow Kub    Result Date: 01/30/2023  1. The above-described findings may be related to  the presence of an intra luminal hematoma/bezoar/foreign body and less likely a tumor in the second portion of the duodenum Electronically signed by Alois BRAVO [facility] Problems             Last Modified POA    * (Principal) PEG tube malfunction (HCC) 02/04/2023 Yes    ADHD 02/04/2023 Yes    Overview Signed 10/05/2020  2:11 PM by Merceda Harding, MD     managed with meds         Autism 02/04/2023 Yes    Depression 02/04/2023 Yes    GERD (gastroesophageal reflux disease) 02/04/2023 Yes    Intractable nausea and vomiting 02/04/2023 Yes    Slow transit constipation 02/04/2023 Yes       Plan   1. Nausea/Vomiting/Constipation: No obvious issues with PEG-J per Dr. Oza. Patient tolerates PO intake and requesting something to eat/drink. Will start on a diet. Significant R sided constipation, which may be contributing to her symptoms. Has colonic inertia and following with [facility] motility specialist; mom agreeable to golytely  prep through PEG-J (likely won't tolerate PO) and soaps suds enemas until she has a BM. Continue with her motegrity  and linzess . MR enterography with PO contrast not available here per mom's request from work up at motility center for her duodenal narrowing (Dr. Oza okay with ordering). I was able to confirm the MRE on their office notes; awaiting call from [facility] on whether they are okay with CT enterography instead. If needing MR enterography, can send it in as an outpatient imaging or have mom contact [facility] with Dr. Cheree as this is her regular GI doctor.     Electronically signed by Vinita Crape, PA on 02/05/23 at 11:00 AM EDT

## 2023-02-05 NOTE — Progress Notes (Addendum)
 Was able to reach provider from [facility] that patient's follows with for clarification on MR enterography recommended; recommending CTA + CT enterography with PO contrast for further evaluation of duodenal narrowing and to have available for next visit with them. Will place orders. No contrast through PEG-J as need to evaluate the duodenum.    Vinita Crape, PA

## 2023-02-05 NOTE — Progress Notes (Shared)
 4 Eyes Skin Assessment     NAME:  Robin Mendoza  DATE OF BIRTH:  Sep 11, 2000  MEDICAL RECORD NUMBER:  [id number]    The patient is being assessed for  Admission    I agree that at least one RN has performed a thorough Head to Toe Skin Assessment on the patient. ALL assessment sites listed below have been assessed.      Areas assessed by both nurses:    Head, Face, Ears, Shoulders, Back, Chest, Arms, Elbows, Hands, Sacrum. Buttock, Coccyx, Ischium, and Legs. Feet and Heels        Does the Patient have a Wound? No noted wound(s)       Braden Prevention initiated by RN: Yes  Wound Care Orders initiated by RN: No    Pressure Injury (Stage 3,4, Unstageable, DTI, NWPT, and Complex wounds) if present, place Wound referral order by RN under ORDER ENTRY: No    New Ostomies, if present place, Ostomy referral order under ORDER ENTRY: No     Nurse 1 eSignature: Electronically signed by Flynn Gentleman, RN on 02/05/23 at 1:34 AM EDT    **SHARE this note so that the co-signing nurse can place an eSignature**    Nurse 2 eSignature: {Esignature:[id number]}

## 2023-02-05 NOTE — Telephone Encounter (Signed)
Also reached out via portal message with the same info. This has been sent to Carley Hammed on that message thread.

## 2023-02-05 NOTE — Progress Notes (Addendum)
 Pt resting in bed at present time without complaints. IVF infusing. Pt NPO.  Rounds performed, all needs met this shift. Mother at bedside. CT abd/pelvic done early this morning.  Bed locked and low, call light within reach. Will give report to oncoming day shift nurse.

## 2023-02-05 NOTE — Care Coordination (Signed)
 02/05/23 1626   Service Assessment   Patient Orientation Alert and Oriented   Cognition Alert   History Provided By Patient   Primary Caregiver Self   Accompanied By/Relationship mother   Support Systems Parent;Family Members   Patient's Healthcare Decision Maker is: Legal Next of Kin   PCP Verified by CM Yes   Last Visit to PCP Within last 6 months   Prior Functional Level Independent in ADLs/IADLs   Current Functional Level Independent in ADLs/IADLs   Can patient return to prior living arrangement Yes   Ability to make needs known: Good   Family able to assist with home care needs: Yes   Would you like for me to discuss the discharge plan with any other family members/significant others, and if so, who? Yes  (parents)   Architect Other (Comment)  secondary school teacher)   Community Resources None   Social/Functional History   Lives With Parent   Type of Home House   Condition of Participation: Discharge Planning   The Plan for Transition of Care is related to the following treatment goals: return home with parents   The Patient and/or Patient Representative was provided with a Choice of Provider? Patient   The Patient and/Or Patient Representative agree with the Discharge Plan? Yes   Freedom of Choice list was provided with basic dialogue that supports the patient's individualized plan of care/goals, treatment preferences, and shares the quality data associated with the providers?  Yes     Assessment completed with patient and her mother in room. Patient lives with her parents. She is independent with all adls, and drives. Patient has a peg tube, she and parents help manage this in the home. Demographics and PCP confirmed.  Discharge plan is to return home with family.    Aracely Rickett LBSW, ACM  Shelvy Leech Case Manager

## 2023-02-05 NOTE — Progress Notes (Signed)
 Hospitalist Progress Note   Admit Date:  02/04/2023  7:40 PM   Name:  Robin Mendoza   Age:  22 y.o.  Sex:  female  DOB:  Robin Mendoza   MRN:  [id number]   Room:  629/01    Presenting/Chief Complaint: No chief complaint on file.     Reason(s) for Admission: PEG tube malfunction [facility]     Hospital Course:   Ian Cavey is a 22 y.o. 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 Graig office for intractable nausea and vomiting with concerns for possible GI obstruction. Patient complained of abdominal cramping with significant N/V.  Of note, patient has had an extensive workup at [facility] and [facility] for weight loss and poor oral intake following which she was referred to Dr. Oza for PEG-J tube placement.     Subjective & 24hr Events:   I can't take anything by mouth.  22y.o. WF sitting up in bed, mother at bedside.    Admits to intermittent abdominal discomfort / pain more so on right side of abdomen.  Currently denies vomiting though maintains nausea.        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  - home dose of Linzess , Motegrity  and tenapanor  resumed  - appreciate ongoing assistance per GI  - pt follows with [facility] motility specialist and has had talks for potential colectomy if CF study is negative      Mild hypokalemia  - mag wnl  - replaced  - f/u AM BMP      Constipation  - significant stool burden on R side of colon with mild wall thickening   - defer mgmt to GI who plans to order golytely  prep through peg-j tube and soaps suds enemas       ADHD, Autism    Depression  - home dose wellbutrin , seroquel , trazodone  as  dosed  - mood ~stable    Anticipated Discharge Arrangements:   Home    PT/OT evals ordered?  Not ordered; patient not expected to need rehab  Diet:  ADULT DIET; Regular; Lactose-Controlled, Vegetarian (Lacto-Ovo)  VTE prophylaxis: Lovenox   Code status: Full Code      Non-peripheral Lines and Tubes (if present):      Gastrostomy/Enterostomy/Jejunostomy Tube LUQ (Active)        Telemetry (if present):           Hospital Problems:  Principal Problem:    PEG tube malfunction (HCC)  Active Problems:    ADHD    Autism    Depression    GERD (gastroesophageal reflux disease)    Intractable nausea and vomiting    Slow transit constipation  Resolved Problems:    * No resolved hospital problems. *      Objective:   Patient Vitals for the past 24 hrs:   Temp Pulse Resp BP SpO2   02/05/23 0723 98.2 F (36.8 C) 77 14 (!) 98/56 100 %   02/05/23 0430 -- -- -- (!) 103/54 --   02/05/23 0316 98.1 F (36.7 C) 81 17 (!) 91/52 98 %   02/04/23 2131 98.7  F (37.1 C) 90 18 123/75 97 %       Oxygen Therapy  SpO2: 100 %  O2 Device: None (Room air)    Estimated body mass index is 20.8 kg/m as calculated from the following:    Height as of this encounter: 1.651 m (5' 5).    Weight as of this encounter: 56.7 kg (125 lb).    Intake/Output Summary (Last 24 hours) at 02/05/2023 1419  Last data filed at 02/05/2023 1201  Gross per 24 hour   Intake 1030.89 ml   Output --   Net 1030.89 ml         Physical Exam:     General:    Well nourished.    Head:  Normocephalic, atraumatic  Eyes:  Sclerae appear normal.  Pupils equally round.  ENT:  Nares appear normal.  Moist oral mucosa  Neck:  No restricted ROM.  Trachea midline   CV:   RRR.  No gross murmur  Lungs:   CTAB.  No wheezing, rhonchi, or rales.  Symmetric expansion.  Abdomen:   Soft, nondistended. +Bsx4, PEG tube in place; tenderness elicited on palpation RUQ / RLQ  Extremities: No cyanosis or clubbing.  No edema  Skin:     No rashes.  Normal coloration.   Warm and dry.    Neuro:  A&Ox3    Psych:  Normal mood and affect.      I have personally reviewed labs and tests:  Recent Labs:  Recent Results (from the past 48 hour(s))   Comprehensive Metabolic Panel w/ Reflex to MG    Collection Time: 02/04/23  8:39 PM   Result Value Ref Range    Sodium 139 136 - 145 mmol/L    Potassium 3.5 3.5 - 5.1 mmol/L    Chloride 107 98 - 107 mmol/L    CO2 22 20 - 28 mmol/L    Anion Gap 10 9 - 18 mmol/L    Glucose 85 70 - 99 mg/dL    BUN 5 (L) 6 - 23 MG/DL    Creatinine 9.30 9.39 - 1.10 MG/DL    Est, Glom Filt Rate >90 >60 ml/min/1.38m2    Calcium 8.6 (L) 8.8 - 10.2 MG/DL    Total Bilirubin 0.2 0.0 - 1.2 MG/DL    ALT 10 8 - 45 U/L    AST 26 15 - 37 U/L    Alk Phosphatase 96 35 - 104 U/L    Total Protein 6.2 (L) 6.3 - 8.2 g/dL    Albumin 3.2 (L) 3.5 - 5.0 g/dL    Globulin 3.0 2.3 - 3.5 g/dL    Albumin/Globulin Ratio 1.0 1.0 - 1.9     CBC with Auto Differential    Collection Time: 02/04/23  8:39 PM   Result Value Ref Range    WBC 7.0 4.3 - 11.1 K/uL    RBC 3.50 (L) 4.05 - 5.2 M/uL    Hemoglobin 10.0 (L) 11.7 - 15.4 g/dL    Hematocrit 68.5 (L) 35.8 - 46.3 %    MCV 89.7 82 - 102 FL    MCH 28.6 26.1 - 32.9 PG    MCHC 31.8 31.4 - 35.0 g/dL    RDW 86.2 88.0 - 85.3 %    Platelets 260 150 - 450 K/uL    MPV 9.7 9.4 - 12.3 FL    nRBC 0.00 0.0 - 0.2 K/uL    Differential Type AUTOMATED      Neutrophils % 55 43 - 78 %  Lymphocytes % 26 13 - 44 %    Monocytes % 10 4.0 - 12.0 %    Eosinophils % 8 (H) 0.5 - 7.8 %    Basophils % 1 0.0 - 2.0 %    Immature Granulocytes % 0 0.0 - 5.0 %    Neutrophils Absolute 3.8 1.7 - 8.2 K/UL    Lymphocytes Absolute 1.8 0.5 - 4.6 K/UL    Monocytes Absolute 0.7 0.1 - 1.3 K/UL    Eosinophils Absolute 0.6 0.0 - 0.8 K/UL    Basophils Absolute 0.1 0.0 - 0.2 K/UL    Immature Granulocytes Absolute 0.0 0.0 - 0.5 K/UL   Magnesium     Collection Time: 02/04/23  8:39 PM   Result Value Ref Range    Magnesium  1.9 1.8 - 2.4 mg/dL   Comprehensive Metabolic Panel w/ Reflex to MG    Collection Time: 02/05/23  5:36 AM    Result Value Ref Range    Sodium 139 136 - 145 mmol/L    Potassium 3.4 (L) 3.5 - 5.1 mmol/L    Chloride 107 98 - 107 mmol/L    CO2 23 20 - 28 mmol/L    Anion Gap 9 9 - 18 mmol/L    Glucose 97 70 - 99 mg/dL    BUN 4 (L) 6 - 23 MG/DL    Creatinine 9.31 9.39 - 1.10 MG/DL    Est, Glom Filt Rate >90 >60 ml/min/1.37m2    Calcium 8.8 8.8 - 10.2 MG/DL    Total Bilirubin 0.2 0.0 - 1.2 MG/DL    ALT 8 8 - 45 U/L    AST 20 15 - 37 U/L    Alk Phosphatase 93 35 - 104 U/L    Total Protein 6.0 (L) 6.3 - 8.2 g/dL    Albumin 3.1 (L) 3.5 - 5.0 g/dL    Globulin 2.9 2.3 - 3.5 g/dL    Albumin/Globulin Ratio 1.1 1.0 - 1.9     CBC with Auto Differential    Collection Time: 02/05/23  5:36 AM   Result Value Ref Range    WBC 6.3 4.3 - 11.1 K/uL    RBC 3.59 (L) 4.05 - 5.2 M/uL    Hemoglobin 10.5 (L) 11.7 - 15.4 g/dL    Hematocrit 67.8 (L) 35.8 - 46.3 %    MCV 89.4 82 - 102 FL    MCH 29.2 26.1 - 32.9 PG    MCHC 32.7 31.4 - 35.0 g/dL    RDW 86.6 88.0 - 85.3 %    Platelets 250 150 - 450 K/uL    MPV 9.7 9.4 - 12.3 FL    nRBC 0.00 0.0 - 0.2 K/uL    Differential Type AUTOMATED      Neutrophils % 48 43 - 78 %    Lymphocytes % 30 13 - 44 %    Monocytes % 12 4.0 - 12.0 %    Eosinophils % 9 (H) 0.5 - 7.8 %    Basophils % 1 0.0 - 2.0 %    Immature Granulocytes % 0 0.0 - 5.0 %    Neutrophils Absolute 3.1 1.7 - 8.2 K/UL    Lymphocytes Absolute 1.9 0.5 - 4.6 K/UL    Monocytes Absolute 0.8 0.1 - 1.3 K/UL    Eosinophils Absolute 0.5 0.0 - 0.8 K/UL    Basophils Absolute 0.0 0.0 - 0.2 K/UL    Immature Granulocytes Absolute 0.0 0.0 - 0.5 K/UL   Magnesium     Collection Time: 02/05/23  5:36  AM   Result Value Ref Range    Magnesium  1.9 1.8 - 2.4 mg/dL       No results for input(s): COVID19 in the last 72 hours.    Current Meds:  Current Facility-Administered Medications   Medication Dose Route Frequency    [START ON 02/06/2023] guanFACINE  (INTUNIV ) extended release tablet 4 mg (Patient Supplied)  4 mg Oral Daily    polyethylene glycol (GoLYTELY ) solution 4,000  mL  4,000 mL Per G Tube Once    buPROPion  (WELLBUTRIN  XL) extended release tablet 150 mg  150 mg Oral Daily    traZODone  (DESYREL ) tablet 100 mg  100 mg Oral Nightly    QUEtiapine  (SEROQUEL ) tablet 100 mg  100 mg Oral Nightly    sodium chloride  flush 0.9 % injection 5-40 mL  5-40 mL IntraVENous 2 times per day    sodium chloride  flush 0.9 % injection 5-40 mL  5-40 mL IntraVENous PRN    0.9 % sodium chloride  infusion   IntraVENous PRN    potassium chloride  (KLOR-CON  M) extended release tablet 40 mEq  40 mEq Oral PRN    Or    potassium bicarb-citric acid  (EFFER-K ) effervescent tablet 40 mEq  40 mEq Oral PRN    Or    potassium chloride  10 mEq/100 mL IVPB (Peripheral Line)  10 mEq IntraVENous PRN    magnesium  sulfate Mendoza mg in 50 mL IVPB premix  2,000 mg IntraVENous PRN    enoxaparin  (LOVENOX ) injection 40 mg  40 mg SubCUTAneous Q24H    polyethylene glycol (GLYCOLAX ) packet 17 g  17 g Per G Tube Daily PRN    acetaminophen  (TYLENOL ) tablet 650 mg  650 mg Oral Q6H PRN    Or    acetaminophen  (TYLENOL ) suppository 650 mg  650 mg Rectal Q6H PRN    dextrose  5 % and 0.45 % sodium chloride  infusion   IntraVENous Continuous    melatonin tablet 3 mg  3 mg Oral Nightly    sennosides-docusate sodium  (SENOKOT-S) 8.6-50 MG tablet 1 tablet  1 tablet PEG Tube BID    ondansetron  (ZOFRAN ) injection 4 mg  4 mg IntraVENous Q4H PRN    famotidine  (PEPCID ) 20 mg in sodium chloride  (PF) 0.9 % 10 mL injection  20 mg IntraVENous BID    prochlorperazine  (COMPAZINE ) injection 5 mg  5 mg IntraVENous Q6H PRN    linaclotide  (LINZESS ) capsule 290 mcg (patient supplied) (Patient Supplied)  290 mcg Oral QAM AC    Tenapanor  HCl TABS 50 mg (patient supplied) (Patient Supplied)  50 mg Oral BID    Prucalopride Succinate  (MOTEGRITY ) tablet 2 mg (patient supplied) (Patient Supplied)  2 mg Oral Daily    bisacodyl  (DULCOLAX) suppository 10 mg  10 mg Rectal Daily    LORazepam  (ATIVAN ) 1 mg in sodium chloride  (PF) 0.9 % 10 mL injection  1 mg IntraVENous Q6H PRN     ketorolac  (TORADOL ) injection 30 mg  30 mg IntraVENous Q6H PRN    busPIRone  (BUSPAR ) tablet 10 mg  10 mg Oral BID    promethazine  (PHENERGAN ) tablet 50 mg  50 mg PEG Tube Q6H PRN    diatrizoate  meglumine -sodium (GASTROGRAFIN ) 66-10 % solution 15 mL  15 mL Oral ONCE PRN       Signed:  Zylee Marchiano Y Mellony Danziger, [place]

## 2023-02-05 NOTE — Telephone Encounter (Signed)
[facility] [place] with GI [phone] re: pt admitted, more info on pt's condition

## 2023-02-06 LAB — CBC WITH AUTO DIFFERENTIAL
Basophils %: 1 % (ref 0.0–2.0)
Basophils Absolute: 0.1 10*3/uL (ref 0.0–0.2)
Eosinophils %: 7 % (ref 0.5–7.8)
Eosinophils Absolute: 0.5 10*3/uL (ref 0.0–0.8)
Hematocrit: 34.8 % — ABNORMAL LOW (ref 35.8–46.3)
Hemoglobin: 11.4 g/dL — ABNORMAL LOW (ref 11.7–15.4)
Immature Granulocytes %: 1 % (ref 0.0–5.0)
Immature Granulocytes Absolute: 0 10*3/uL (ref 0.0–0.5)
Lymphocytes %: 27 % (ref 13–44)
Lymphocytes Absolute: 2.2 10*3/uL (ref 0.5–4.6)
MCH: 29 pg (ref 26.1–32.9)
MCHC: 32.8 g/dL (ref 31.4–35.0)
MCV: 88.5 FL (ref 82–102)
MPV: 10.4 FL (ref 9.4–12.3)
Monocytes %: 11 % (ref 4.0–12.0)
Monocytes Absolute: 0.9 10*3/uL (ref 0.1–1.3)
Neutrophils %: 53 % (ref 43–78)
Neutrophils Absolute: 4.4 10*3/uL (ref 1.7–8.2)
Platelets: 308 10*3/uL (ref 150–450)
RBC: 3.93 M/uL — ABNORMAL LOW (ref 4.05–5.2)
RDW: 13.5 % (ref 11.9–14.6)
WBC: 8.1 10*3/uL (ref 4.3–11.1)
nRBC: 0 10*3/uL (ref 0.0–0.2)

## 2023-02-06 LAB — COMPREHENSIVE METABOLIC PANEL W/ REFLEX TO MG FOR LOW K
ALT: 7 U/L — ABNORMAL LOW (ref 8–45)
AST: 27 U/L (ref 15–37)
Albumin/Globulin Ratio: 1.1 (ref 1.0–1.9)
Albumin: 3.4 g/dL — ABNORMAL LOW (ref 3.5–5.0)
Alk Phosphatase: 104 U/L (ref 35–104)
Anion Gap: 8 mmol/L — ABNORMAL LOW (ref 9–18)
BUN: 3 mg/dL — ABNORMAL LOW (ref 6–23)
CO2: 24 mmol/L (ref 20–28)
Calcium: 8.8 mg/dL (ref 8.8–10.2)
Chloride: 110 mmol/L — ABNORMAL HIGH (ref 98–107)
Creatinine: 0.64 mg/dL (ref 0.60–1.10)
Est, Glom Filt Rate: >90 mL/min/{1.73_m2} (ref >60)
Globulin: 3.2 g/dL (ref 2.3–3.5)
Glucose: 132 mg/dL — ABNORMAL HIGH (ref 70–99)
Potassium: 4.4 mmol/L (ref 3.5–5.1)
Sodium: 143 mmol/L (ref 136–145)
Total Bilirubin: 0.2 mg/dL (ref 0.0–1.2)
Total Protein: 6.6 g/dL (ref 6.3–8.2)

## 2023-02-06 LAB — PHOSPHORUS: Phosphorus: 2.9 mg/dL (ref 2.5–4.5)

## 2023-02-06 LAB — MAGNESIUM: Magnesium: 1.9 mg/dL (ref 1.8–2.4)

## 2023-02-06 MED ADMIN — ondansetron (ZOFRAN) injection 4 mg: 4 mg | INTRAVENOUS | @ 16:00:00 | NDC 23155054831

## 2023-02-06 MED ADMIN — ondansetron (ZOFRAN) injection 4 mg: 4 mg | INTRAVENOUS | @ 08:00:00 | NDC 23155054831

## 2023-02-06 MED ADMIN — famotidine (PEPCID) 20 mg in sodium chloride (PF) 0.9 % 10 mL injection: 20 mg | INTRAVENOUS | @ 01:00:00 | NDC 63323073911

## 2023-02-06 MED ADMIN — prochlorperazine (COMPAZINE) injection 5 mg: 5 mg | INTRAVENOUS | @ 13:00:00 | NDC 00641613501

## 2023-02-06 MED ADMIN — sodium chloride flush 0.9 % injection 5-40 mL: 10 mL | INTRAVENOUS | @ 01:00:00

## 2023-02-06 MED ADMIN — sennosides-docusate sodium (SENOKOT-S) 8.6-50 MG tablet 1 tablet: 1 | GASTROSTOMY | @ 01:00:00 | NDC 00536124801

## 2023-02-06 MED ADMIN — sennosides-docusate sodium (SENOKOT-S) 8.6-50 MG tablet 1 tablet: 1 | GASTROSTOMY | @ 13:00:00 | NDC 00536124801

## 2023-02-06 MED ADMIN — buPROPion (WELLBUTRIN XL) extended release tablet 150 mg: 150 mg | ORAL | @ 13:00:00 | NDC 68180031909

## 2023-02-06 MED ADMIN — prochlorperazine (COMPAZINE) injection 5 mg: 5 mg | INTRAVENOUS | @ 21:00:00 | NDC 00641613501

## 2023-02-06 MED ADMIN — busPIRone (BUSPAR) tablet 10 mg: 10 mg | ORAL | @ 01:00:00 | NDC 00904712161

## 2023-02-06 MED ADMIN — busPIRone (BUSPAR) tablet 10 mg: 10 mg | ORAL | @ 13:00:00 | NDC 00904712161

## 2023-02-06 MED ADMIN — promethazine (PHENERGAN) tablet 50 mg: 50 mg | GASTROSTOMY | @ 01:00:00 | NDC 00904730461

## 2023-02-06 MED ADMIN — dextrose 5 % and 0.45 % sodium chloride infusion: INTRAVENOUS | @ 10:00:00 | NDC 00338008504

## 2023-02-06 MED ADMIN — traZODone (DESYREL) tablet 100 mg: 100 mg | ORAL | @ 01:00:00 | NDC 60687044311

## 2023-02-06 MED ADMIN — dextrose 5 % and 0.45 % sodium chloride infusion: INTRAVENOUS | @ 21:00:00 | NDC 00338008504

## 2023-02-06 MED ADMIN — sodium chloride flush 0.9 % injection 5-40 mL: 10 mL | INTRAVENOUS | @ 13:00:00

## 2023-02-06 MED ADMIN — QUEtiapine (SEROQUEL) tablet 100 mg: 100 mg | ORAL | @ 01:00:00 | NDC 67877025001

## 2023-02-06 MED ADMIN — dextrose 5 % and 0.45 % sodium chloride infusion: INTRAVENOUS | @ 04:00:00 | NDC 00338008504

## 2023-02-06 MED ADMIN — famotidine (PEPCID) 20 mg in sodium chloride (PF) 0.9 % 10 mL injection: 20 mg | INTRAVENOUS | @ 13:00:00 | NDC 63323073911

## 2023-02-06 MED ADMIN — bisacodyl (DULCOLAX) suppository 10 mg: 10 mg | RECTAL | @ 13:00:00 | NDC 00574705012

## 2023-02-06 MED FILL — BUSPIRONE HCL 10 MG PO TABS: 10 MG | ORAL | Qty: 1

## 2023-02-06 MED FILL — ONDANSETRON HCL 4 MG/2ML IJ SOLN: 4 MG/2ML | INTRAMUSCULAR | Qty: 2

## 2023-02-06 MED FILL — FAMOTIDINE (PF) 20 MG/2ML IV SOLN: 20 MG/2ML | INTRAVENOUS | Qty: 2

## 2023-02-06 MED FILL — PROCHLORPERAZINE EDISYLATE 10 MG/2ML IJ SOLN: 10 MG/2ML | INTRAMUSCULAR | Qty: 2

## 2023-02-06 MED FILL — PROMETHAZINE HCL 25 MG PO TABS: 25 MG | ORAL | Qty: 2

## 2023-02-06 MED FILL — SENNOSIDES-DOCUSATE SODIUM 8.6-50 MG PO TABS: ORAL | Qty: 1

## 2023-02-06 MED FILL — BISACODYL 10 MG RE SUPP: 10 MG | RECTAL | Qty: 1

## 2023-02-06 MED FILL — BUPROPION HCL ER (XL) 150 MG PO TB24: 150 MG | ORAL | Qty: 1

## 2023-02-06 NOTE — Plan of Care (Signed)
Problem: Discharge Planning  Goal: Discharge to home or other facility with appropriate resources  Outcome: Progressing

## 2023-02-06 NOTE — Progress Notes (Signed)
 GASTROENTEROLOGY PROGRESS NOTE     CC: N/V     Interval Events:  She continues to have nausea, not tolerating oral intake. She has had loose liquid bowel movements since starting the Golytely .     CTE overall unrevealing     PE:   Vitals:    02/06/23 0818   BP: 134/72   Pulse: (!) 112   Resp: 22   Temp: 98 F (36.7 C)   SpO2: 98%      General:  The patient appears well-nourished, and is in no acute distress.    Abdomen:  Soft. No distention.      Labs:  Lab Results   Component Value Date    HGB 11.4 (L) 02/06/2023    WBC 8.1 02/06/2023    PLT 308 02/06/2023    MCV 88.5 02/06/2023    IRON 83 01/31/2021    FERRITIN 90 01/31/2021    TIBC 213 (L) 01/31/2021    CREATININE 0.64 02/06/2023    ALT 7 (L) 02/06/2023    AST 27 02/06/2023         Assessment/Plan:      Nausea/Vomiting/Constipation: No obvious issues with PEG-J per Dr. Oza. 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      - Continue Golytely    - Discussed plan with patient's parents at bedside, they are hoping for more solidified GI plan going forward. Will follow up any further recommendations from Dr. Oza. Patient will need close follow up at discharge with her motility specialist       Robin Burich, MD  [facility]

## 2023-02-06 NOTE — Progress Notes (Signed)
 Hourly rounds performed this shift. Bed lowered and locked. Call light within reach. Pt tolerated PO medications. PRN nausea medications given. Golytely given through PEG tube. Pt father reports BM this shift. Per pt she denies having BM this shift.

## 2023-02-06 NOTE — Progress Notes (Signed)
 Hourly rounding completed during shift.  Medicated per [facility] downtime forms.  Pt tolerated diet with some N/V during the day.  All needs met at this time. Bed L/L with call bell in reach, Dad remains at bedside.  Report given to oncoming RN.

## 2023-02-06 NOTE — Progress Notes (Signed)
 Hospitalist Progress Note   Admit Date:  02/04/2023  7:40 PM   Name:  Robin Mendoza   Age:  22 y.o.  Sex:  female  DOB:  [birth year]   MRN:  [id number]   Room:  629/01    Presenting/Chief Complaint: No chief complaint on file.     Reason(s) for Admission: PEG tube malfunction [facility]) [facility]     Hospital Course:   Robin Mendoza is a 22 y.o. 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 Graig office for intractable nausea and vomiting with concerns for possible GI obstruction. Patient complained of abdominal cramping with significant N/V.  Of note, patient has had an extensive workup at [facility] and [facility] for weight loss and poor oral intake following which she was referred to Dr. Oza for PEG-J tube placement.     Subjective & 24hr Events:   I vomited the oatmeal and shake this morning.  I did have a BM. 22y.o. WF sitting on the floor with her father doing puzzles in NAD    Admits to intermittent abdominal discomfort / pain with persistent nausea.  Denies fever, chills, CP, HA.      Assessment & Plan:     Principal Problem:    Rule out PEG tube malfunction   - s/p CT A/P and CT enterography without obstructive process  - ongoing mgmt per GI    Active Problems:    Nausea / vomiting; established hx of global slow GI transit  - home dose of Linzess , Motegrity  and tenapanor  resumed  - encourage pt to take small sips of water  to promote GI motility   - pt follows with [facility] motility specialist and has had talks for potential colectomy if CF study is negative  - again further recommendations per GI      Mild hypokalemia  - resolved      Constipation  - s/p BM last PM; cont bowel prep per GI      ADHD, Autism    Depression  - home dose wellbutrin , seroquel , trazodone  as dosed  - mood ~stable    Anticipated Discharge  Arrangements:   Home once medically stable per GI  - will inquire with GI team in AM if they will take over patient's case as primary     PT/OT evals ordered?  Not ordered; patient not expected to need rehab  Diet:  ADULT DIET; Regular; Lactose-Controlled, Vegan  VTE prophylaxis: Lovenox   Code status: Full Code      Non-peripheral Lines and Tubes (if present):      Gastrostomy/Enterostomy/Jejunostomy Tube LUQ (Active)        Telemetry (if present):           Hospital Problems:  Principal Problem:    PEG tube malfunction (HCC)  Active Problems:    ADHD    Autism    Depression    GERD (gastroesophageal reflux disease)    Intractable nausea and vomiting    Slow transit constipation  Resolved Problems:    * No resolved hospital problems. *      Objective:   Patient Vitals for the past 24 hrs:   Temp Pulse Resp BP SpO2   02/06/23 0818 98 F (36.7 C) (!) 112 22 134/72 98 %   02/05/23 1936 98.1 F (36.7 C) 84 16 125/82 97 %  02/05/23 1527 98.1 F (36.7 C) 82 16 112/72 98 %       Oxygen Therapy  SpO2: 98 %  O2 Device: None (Room air)    Estimated body mass index is 20.8 kg/m as calculated from the following:    Height as of this encounter: 1.651 m (5' 5).    Weight as of this encounter: 56.7 kg (125 lb).    Intake/Output Summary (Last 24 hours) at 02/06/2023 1520  Last data filed at 02/06/2023 9378  Gross per 24 hour   Intake 1553.3 ml   Output --   Net 1553.3 ml         Physical Exam:     General:    Well nourished.    Head:  Normocephalic, atraumatic  Eyes:  Sclerae appear normal.  Pupils equally round.  ENT:  Nares appear normal.  Moist oral mucosa  Neck:  No restricted ROM.  Trachea midline   CV:   RRR.  No gross murmur  Lungs:   CTAB.  No wheezing, rhonchi, or rales.  Symmetric expansion.  Abdomen:   Soft, nondistended. +BS x 4 with greater BS RUQ / RLQ compared to LUQ / LLQ; PEG tube in place; tenderness elicited on palpation RUQ / RLQ  Extremities: No cyanosis or clubbing.  No edema  Skin:     No rashes.  Normal  coloration.   Warm and dry.    Neuro:  A&Ox3   Psych:  Normal mood and affect.      I have personally reviewed labs and tests:  Recent Labs:  Recent Results (from the past 48 hour(s))   Comprehensive Metabolic Panel w/ Reflex to MG    Collection Time: 02/04/23  8:39 PM   Result Value Ref Range    Sodium 139 136 - 145 mmol/L    Potassium 3.5 3.5 - 5.1 mmol/L    Chloride 107 98 - 107 mmol/L    CO2 22 20 - 28 mmol/L    Anion Gap 10 9 - 18 mmol/L    Glucose 85 70 - 99 mg/dL    BUN 5 (L) 6 - 23 MG/DL    Creatinine 9.30 9.39 - 1.10 MG/DL    Est, Glom Filt Rate >90 >60 ml/min/1.1m2    Calcium 8.6 (L) 8.8 - 10.2 MG/DL    Total Bilirubin 0.2 0.0 - 1.2 MG/DL    ALT 10 8 - 45 U/L    AST 26 15 - 37 U/L    Alk Phosphatase 96 35 - 104 U/L    Total Protein 6.2 (L) 6.3 - 8.2 g/dL    Albumin 3.2 (L) 3.5 - 5.0 g/dL    Globulin 3.0 2.3 - 3.5 g/dL    Albumin/Globulin Ratio 1.0 1.0 - 1.9     CBC with Auto Differential    Collection Time: 02/04/23  8:39 PM   Result Value Ref Range    WBC 7.0 4.3 - 11.1 K/uL    RBC 3.50 (L) 4.05 - 5.2 M/uL    Hemoglobin 10.0 (L) 11.7 - 15.4 g/dL    Hematocrit 68.5 (L) 35.8 - 46.3 %    MCV 89.7 82 - 102 FL    MCH 28.6 26.1 - 32.9 PG    MCHC 31.8 31.4 - 35.0 g/dL    RDW 86.2 88.0 - 85.3 %    Platelets 260 150 - 450 K/uL    MPV 9.7 9.4 - 12.3 FL    nRBC 0.00 0.0 - 0.2 K/uL  Differential Type AUTOMATED      Neutrophils % 55 43 - 78 %    Lymphocytes % 26 13 - 44 %    Monocytes % 10 4.0 - 12.0 %    Eosinophils % 8 (H) 0.5 - 7.8 %    Basophils % 1 0.0 - 2.0 %    Immature Granulocytes % 0 0.0 - 5.0 %    Neutrophils Absolute 3.8 1.7 - 8.2 K/UL    Lymphocytes Absolute 1.8 0.5 - 4.6 K/UL    Monocytes Absolute 0.7 0.1 - 1.3 K/UL    Eosinophils Absolute 0.6 0.0 - 0.8 K/UL    Basophils Absolute 0.1 0.0 - 0.2 K/UL    Immature Granulocytes Absolute 0.0 0.0 - 0.5 K/UL   Magnesium     Collection Time: 02/04/23  8:39 PM   Result Value Ref Range    Magnesium  1.9 1.8 - 2.4 mg/dL   Comprehensive Metabolic Panel w/ Reflex to  MG    Collection Time: 02/05/23  5:36 AM   Result Value Ref Range    Sodium 139 136 - 145 mmol/L    Potassium 3.4 (L) 3.5 - 5.1 mmol/L    Chloride 107 98 - 107 mmol/L    CO2 23 20 - 28 mmol/L    Anion Gap 9 9 - 18 mmol/L    Glucose 97 70 - 99 mg/dL    BUN 4 (L) 6 - 23 MG/DL    Creatinine 9.31 9.39 - 1.10 MG/DL    Est, Glom Filt Rate >90 >60 ml/min/1.32m2    Calcium 8.8 8.8 - 10.2 MG/DL    Total Bilirubin 0.2 0.0 - 1.2 MG/DL    ALT 8 8 - 45 U/L    AST 20 15 - 37 U/L    Alk Phosphatase 93 35 - 104 U/L    Total Protein 6.0 (L) 6.3 - 8.2 g/dL    Albumin 3.1 (L) 3.5 - 5.0 g/dL    Globulin 2.9 2.3 - 3.5 g/dL    Albumin/Globulin Ratio 1.1 1.0 - 1.9     CBC with Auto Differential    Collection Time: 02/05/23  5:36 AM   Result Value Ref Range    WBC 6.3 4.3 - 11.1 K/uL    RBC 3.59 (L) 4.05 - 5.2 M/uL    Hemoglobin 10.5 (L) 11.7 - 15.4 g/dL    Hematocrit 67.8 (L) 35.8 - 46.3 %    MCV 89.4 82 - 102 FL    MCH 29.2 26.1 - 32.9 PG    MCHC 32.7 31.4 - 35.0 g/dL    RDW 86.6 88.0 - 85.3 %    Platelets 250 150 - 450 K/uL    MPV 9.7 9.4 - 12.3 FL    nRBC 0.00 0.0 - 0.2 K/uL    Differential Type AUTOMATED      Neutrophils % 48 43 - 78 %    Lymphocytes % 30 13 - 44 %    Monocytes % 12 4.0 - 12.0 %    Eosinophils % 9 (H) 0.5 - 7.8 %    Basophils % 1 0.0 - 2.0 %    Immature Granulocytes % 0 0.0 - 5.0 %    Neutrophils Absolute 3.1 1.7 - 8.2 K/UL    Lymphocytes Absolute 1.9 0.5 - 4.6 K/UL    Monocytes Absolute 0.8 0.1 - 1.3 K/UL    Eosinophils Absolute 0.5 0.0 - 0.8 K/UL    Basophils Absolute 0.0 0.0 - 0.2 K/UL    Immature  Granulocytes Absolute 0.0 0.0 - 0.5 K/UL   Magnesium     Collection Time: 02/05/23  5:36 AM   Result Value Ref Range    Magnesium  1.9 1.8 - 2.4 mg/dL   Comprehensive Metabolic Panel w/ Reflex to MG    Collection Time: 02/06/23  5:18 AM   Result Value Ref Range    Sodium 143 136 - 145 mmol/L    Potassium 4.4 3.5 - 5.1 mmol/L    Chloride 110 (H) 98 - 107 mmol/L    CO2 24 20 - 28 mmol/L    Anion Gap 8 (L) 9 - 18 mmol/L     Glucose 132 (H) 70 - 99 mg/dL    BUN 3 (L) 6 - 23 MG/DL    Creatinine 9.35 9.39 - 1.10 MG/DL    Est, Glom Filt Rate >90 >60 ml/min/1.24m2    Calcium 8.8 8.8 - 10.2 MG/DL    Total Bilirubin 0.2 0.0 - 1.2 MG/DL    ALT 7 (L) 8 - 45 U/L    AST 27 15 - 37 U/L    Alk Phosphatase 104 35 - 104 U/L    Total Protein 6.6 6.3 - 8.2 g/dL    Albumin 3.4 (L) 3.5 - 5.0 g/dL    Globulin 3.2 2.3 - 3.5 g/dL    Albumin/Globulin Ratio 1.1 1.0 - 1.9     CBC with Auto Differential    Collection Time: 02/06/23  5:18 AM   Result Value Ref Range    WBC 8.1 4.3 - 11.1 K/uL    RBC 3.93 (L) 4.05 - 5.2 M/uL    Hemoglobin 11.4 (L) 11.7 - 15.4 g/dL    Hematocrit 65.1 (L) 35.8 - 46.3 %    MCV 88.5 82 - 102 FL    MCH 29.0 26.1 - 32.9 PG    MCHC 32.8 31.4 - 35.0 g/dL    RDW 86.4 88.0 - 85.3 %    Platelets 308 150 - 450 K/uL    MPV 10.4 9.4 - 12.3 FL    nRBC 0.00 0.0 - 0.2 K/uL    Differential Type AUTOMATED      Neutrophils % 53 43 - 78 %    Lymphocytes % 27 13 - 44 %    Monocytes % 11 4.0 - 12.0 %    Eosinophils % 7 0.5 - 7.8 %    Basophils % 1 0.0 - 2.0 %    Immature Granulocytes % 1 0.0 - 5.0 %    Neutrophils Absolute 4.4 1.7 - 8.2 K/UL    Lymphocytes Absolute 2.2 0.5 - 4.6 K/UL    Monocytes Absolute 0.9 0.1 - 1.3 K/UL    Eosinophils Absolute 0.5 0.0 - 0.8 K/UL    Basophils Absolute 0.1 0.0 - 0.2 K/UL    Immature Granulocytes Absolute 0.0 0.0 - 0.5 K/UL   Magnesium     Collection Time: 02/06/23  5:18 AM   Result Value Ref Range    Magnesium  1.9 1.8 - 2.4 mg/dL   Phosphorus    Collection Time: 02/06/23  5:18 AM   Result Value Ref Range    Phosphorus 2.9 2.5 - 4.5 MG/DL       No results for input(s): COVID19 in the last 72 hours.    Current Meds:  Current Facility-Administered Medications   Medication Dose Route Frequency    guanFACINE  (INTUNIV ) extended release tablet 4 mg (Patient Supplied)  4 mg Oral Daily    LORazepam  (ATIVAN ) 0.5 mg in sodium chloride  (PF)  0.9 % 10 mL injection  0.5 mg IntraVENous Q6H PRN    sodium phosphate  15 mmol in sodium  chloride 0.9 % 250 mL IVPB  15 mmol IntraVENous PRN    buPROPion  (WELLBUTRIN  XL) extended release tablet 150 mg  150 mg Oral Daily    traZODone  (DESYREL ) tablet 100 mg  100 mg Oral Nightly    QUEtiapine  (SEROQUEL ) tablet 100 mg  100 mg Oral Nightly    sodium chloride  flush 0.9 % injection 5-40 mL  5-40 mL IntraVENous 2 times per day    sodium chloride  flush 0.9 % injection 5-40 mL  5-40 mL IntraVENous PRN    0.9 % sodium chloride  infusion   IntraVENous PRN    potassium chloride  (KLOR-CON  M) extended release tablet 40 mEq  40 mEq Oral PRN    Or    potassium bicarb-citric acid  (EFFER-K ) effervescent tablet 40 mEq  40 mEq Oral PRN    Or    potassium chloride  10 mEq/100 mL IVPB (Peripheral Line)  10 mEq IntraVENous PRN    magnesium  sulfate 2000 mg in 50 mL IVPB premix  2,000 mg IntraVENous PRN    enoxaparin  (LOVENOX ) injection 40 mg  40 mg SubCUTAneous Q24H    polyethylene glycol (GLYCOLAX ) packet 17 g  17 g Per G Tube Daily PRN    acetaminophen  (TYLENOL ) tablet 650 mg  650 mg Oral Q6H PRN    Or    acetaminophen  (TYLENOL ) suppository 650 mg  650 mg Rectal Q6H PRN    dextrose  5 % and 0.45 % sodium chloride  infusion   IntraVENous Continuous    melatonin tablet 3 mg  3 mg Oral Nightly    sennosides-docusate sodium  (SENOKOT-S) 8.6-50 MG tablet 1 tablet  1 tablet PEG Tube BID    ondansetron  (ZOFRAN ) injection 4 mg  4 mg IntraVENous Q4H PRN    famotidine  (PEPCID ) 20 mg in sodium chloride  (PF) 0.9 % 10 mL injection  20 mg IntraVENous BID    prochlorperazine  (COMPAZINE ) injection 5 mg  5 mg IntraVENous Q6H PRN    linaclotide  (LINZESS ) capsule 290 mcg (patient supplied) (Patient Supplied)  290 mcg Oral QAM AC    Tenapanor  HCl TABS 50 mg (patient supplied) (Patient Supplied)  50 mg Oral BID    Prucalopride Succinate  (MOTEGRITY ) tablet 2 mg (patient supplied) (Patient Supplied)  2 mg Oral Daily    bisacodyl  (DULCOLAX) suppository 10 mg  10 mg Rectal Daily    ketorolac  (TORADOL ) injection 30 mg  30 mg IntraVENous Q6H PRN     busPIRone  (BUSPAR ) tablet 10 mg  10 mg Oral BID    promethazine  (PHENERGAN ) tablet 50 mg  50 mg PEG Tube Q6H PRN    diatrizoate  meglumine -sodium (GASTROGRAFIN ) 66-10 % solution 15 mL  15 mL Oral ONCE PRN       Signed:  Cassell Voorhies Y Amariz Flamenco, [place]

## 2023-02-06 NOTE — Progress Notes (Signed)
 Nutrition:    Reviewed remotely from [facility], [facility] on downtime.  Discussed with May, [place].  Electrolytes wnl this am.  +BM reported overnight.  Remains appropriate to await resumption of home enteral feedings via Jtube.   Abdominal Status (last documented by nursing):    , GI Symptoms: Nausea   Pertinent Medications: dulcolax suppository, pepcid , intuniv , linzess , motegrity , senokot, tenapanor   Continuous: none  IVF: D5 0.45%NS @ 100 ml/hr  Electrolyte Replacement:  none required today  Pertinent Labs:   Lab Results   Component Value Date/Time    NA 143 02/06/2023 05:18 AM    K 4.4 02/06/2023 05:18 AM    CL 110 02/06/2023 05:18 AM    CO2 24 02/06/2023 05:18 AM    BUN 3 02/06/2023 05:18 AM    CREATININE 0.64 02/06/2023 05:18 AM    GLUCOSE 132 02/06/2023 05:18 AM    CALCIUM 8.8 02/06/2023 05:18 AM    PHOS 2.9 02/06/2023 05:18 AM    MG 1.9 02/06/2023 05:18 AM       Marvel Mcphillips T, RD

## 2023-02-06 NOTE — Progress Notes (Signed)
US Guided PIV access-    Ultrasound was used to find the vein which was compressible and without any ultrasound features of an artery or nerve bundle. Skin was cleaned and disinfected prior to IV puncture.  Under real-time ultrasound guidance peripheral access was obtained in the left cephalic using 20 G [id number] Peripheral IV catheter after 1 attempt(s). Blood return was present and IV flushed without difficulty with no clinical signs of infiltration. IV dressing applied and no immediate complications noted. Patient tolerated the procedure well.

## 2023-02-07 ENCOUNTER — Inpatient Hospital Stay: Payer: PRIVATE HEALTH INSURANCE

## 2023-02-07 MED ORDER — PEG 3350-KCL-NABCB-NACL-NASULF 236 G PO SOLR
236 | Freq: Once | ORAL | Status: AC
Start: 2023-02-07 — End: 2023-02-07

## 2023-02-07 MED ORDER — IOPAMIDOL 76 % IV SOLN
76 | Freq: Once | INTRAVENOUS | Status: DC | PRN
Start: 2023-02-07 — End: 2023-02-11

## 2023-02-07 MED ADMIN — prochlorperazine (COMPAZINE) injection 5 mg: 5 mg | INTRAVENOUS | @ 19:00:00 | NDC 00641613501

## 2023-02-07 MED ADMIN — ketorolac (TORADOL) injection 30 mg: 30 mg | INTRAVENOUS | @ 20:00:00 | NDC 63323016243

## 2023-02-07 MED ADMIN — sennosides-docusate sodium (SENOKOT-S) 8.6-50 MG tablet 1 tablet: 1 | GASTROSTOMY | @ 01:00:00 | NDC 00536124801

## 2023-02-07 MED ADMIN — linaclotide (LINZESS) capsule 290 mcg (patient supplied) (Patient Supplied): 290 ug | ORAL | @ 15:00:00 | NDC 00456120230

## 2023-02-07 MED ADMIN — sennosides-docusate sodium (SENOKOT-S) 8.6-50 MG tablet 1 tablet: 1 | GASTROSTOMY | @ 15:00:00 | NDC 00536124801

## 2023-02-07 MED ADMIN — busPIRone (BUSPAR) tablet 10 mg: 10 mg | ORAL | @ 15:00:00 | NDC 00904712161

## 2023-02-07 MED ADMIN — buPROPion (WELLBUTRIN XL) extended release tablet 150 mg: 150 mg | ORAL | @ 15:00:00 | NDC 68180031909

## 2023-02-07 MED ADMIN — sodium chloride flush 0.9 % injection 5-40 mL: 10 mL | INTRAVENOUS | @ 15:00:00 | NDC 63807010916

## 2023-02-07 MED ADMIN — polyethylene glycol (GoLYTELY) solution 4,000 mL: 4000 mL | ORAL | @ 21:00:00 | NDC 43386009019

## 2023-02-07 MED ADMIN — busPIRone (BUSPAR) tablet 10 mg: 10 mg | ORAL | @ 01:00:00 | NDC 00904712161

## 2023-02-07 MED ADMIN — sodium chloride flush 0.9 % injection 5-40 mL: 10 mL | INTRAVENOUS | @ 01:00:00

## 2023-02-07 MED ADMIN — traZODone (DESYREL) tablet 100 mg: 100 mg | ORAL | @ 01:00:00 | NDC 60687044311

## 2023-02-07 MED ADMIN — QUEtiapine (SEROQUEL) tablet 100 mg: 100 mg | ORAL | @ 01:00:00 | NDC 67877025001

## 2023-02-07 MED ADMIN — promethazine (PHENERGAN) tablet 50 mg: 50 mg | GASTROSTOMY | @ 01:00:00 | NDC 00904730461

## 2023-02-07 MED ADMIN — famotidine (PEPCID) 20 mg in sodium chloride (PF) 0.9 % 10 mL injection: 20 mg | INTRAVENOUS | @ 01:00:00 | NDC 63323073911

## 2023-02-07 MED ADMIN — ondansetron (ZOFRAN) injection 4 mg: 4 mg | INTRAVENOUS | @ 01:00:00 | NDC 23155054831

## 2023-02-07 MED ADMIN — Prucalopride Succinate (MOTEGRITY) tablet 2 mg (patient supplied) (Patient Supplied): 2 mg | ORAL | @ 15:00:00 | NDC 54092054701

## 2023-02-07 MED ADMIN — ondansetron (ZOFRAN) injection 4 mg: 4 mg | INTRAVENOUS | @ 22:00:00 | NDC 23155054831

## 2023-02-07 MED ADMIN — bisacodyl (DULCOLAX) suppository 10 mg: 10 mg | RECTAL | @ 15:00:00 | NDC 00574705012

## 2023-02-07 MED ADMIN — famotidine (PEPCID) 20 mg in sodium chloride (PF) 0.9 % 10 mL injection: 20 mg | INTRAVENOUS | @ 15:00:00 | NDC 63323073911

## 2023-02-07 MED ADMIN — ondansetron (ZOFRAN) injection 4 mg: 4 mg | INTRAVENOUS | @ 16:00:00 | NDC 23155054831

## 2023-02-07 MED FILL — FAMOTIDINE (PF) 20 MG/2ML IV SOLN: 20 MG/2ML | INTRAVENOUS | Qty: 2

## 2023-02-07 MED FILL — PROMETHAZINE HCL 25 MG PO TABS: 25 MG | ORAL | Qty: 2

## 2023-02-07 MED FILL — GAVILYTE-G 236 G PO SOLR: 236 g | ORAL | Qty: 4000

## 2023-02-07 MED FILL — BUSPIRONE HCL 10 MG PO TABS: 10 MG | ORAL | Qty: 1

## 2023-02-07 MED FILL — QUETIAPINE FUMARATE 100 MG PO TABS: 100 MG | ORAL | Qty: 1

## 2023-02-07 MED FILL — BUPROPION HCL ER (XL) 150 MG PO TB24: 150 MG | ORAL | Qty: 1

## 2023-02-07 MED FILL — ONDANSETRON HCL 4 MG/2ML IJ SOLN: 4 MG/2ML | INTRAMUSCULAR | Qty: 2

## 2023-02-07 MED FILL — SENNOSIDES-DOCUSATE SODIUM 8.6-50 MG PO TABS: ORAL | Qty: 1

## 2023-02-07 MED FILL — PROCHLORPERAZINE EDISYLATE 10 MG/2ML IJ SOLN: 10 MG/2ML | INTRAMUSCULAR | Qty: 2

## 2023-02-07 MED FILL — BISACODYL 10 MG RE SUPP: 10 MG | RECTAL | Qty: 1

## 2023-02-07 MED FILL — TRAZODONE HCL 50 MG PO TABS: 50 MG | ORAL | Qty: 2

## 2023-02-07 MED FILL — KETOROLAC TROMETHAMINE 30 MG/ML IJ SOLN: 30 MG/ML | INTRAMUSCULAR | Qty: 1

## 2023-02-07 NOTE — Progress Notes (Signed)
 Hospitalist Progress Note   Admit Date:  02/04/2023  7:40 PM   Name:  Robin Mendoza   Age:  22 y.o.  Sex:  female  DOB:  [birth year]   MRN:  [id number]   Room:  629/01    Presenting/Chief Complaint: No chief complaint on file.     Reason(s) for Admission: PEG tube malfunction [facility]     Hospital Course:   Robin Mendoza is a 22 y.o. 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 Graig office for intractable nausea and vomiting with concerns for possible GI obstruction. Patient complained of abdominal cramping with significant N/V.      Of note, patient has had an extensive workup at [facility] and [facility] for weight loss and poor oral intake following which she was referred to Dr. Oza for PEG-J tube placement.     Subjective & 24hr Events:   I vomited water . Pleasant lucid 22y.o. WF sitting up in bed in NAD    Admits to ongoing N/V (unwitnessed by staff), abdominal discomfort / pain.  Denies fever, chills, CP, HA.      Assessment & Plan:     Principal Problem:    Ruled out PEG tube malfunction   - s/p CT A/P and CT enterography without obstructive process  - ongoing mgmt per GI  - d/w Dr. Verne who discussed with Dr. Oza, no obstructive process and Dr. Verne agreed he would personally talk to patient and patient's mother about radiologic findings and plan of care  - after discussing with Dr. Youlanda who agreed that as pt's currently medical issues is GI in etiology to inquire with Dr. Verne if GI would accept patient onto their service.  D/w Dr. Verne who declined transitioning patient onto his service today.  Per my conversation with Dr. Verne, as patient's current active issue is constipation, he will order Golytely  via G-J tube and he instructed RN to monitor / quantify BMs and once Golytely  prep completes to  RESUME tube feeds today    Active Problems:    Nausea / vomiting; established hx of global slow GI transit  - ongoing mgmt per GI      Mild hypokalemia  - resolved; last K+ 4.4 (02/06/2023)      Constipation  - mgmt per GI      ADHD, Autism    Depression  - home dose wellbutrin , seroquel , trazodone  as dosed    Anticipated Discharge Arrangements:   Home once medically stable per GI    PT/OT evals ordered?  Not ordered; patient not expected to need rehab  Diet:  ADULT DIET; Regular; Lactose-Controlled, Vegan  ADULT TUBE FEEDING; PEG/J; Other Tube Feeding (specify); Home formula; Continuous; 40; Yes; 10; Other (specify); per pateint tolerance; 50; 75; Q 1 hour  VTE prophylaxis: Lovenox   Code status: Full Code      Non-peripheral Lines and Tubes (if present):      Gastrostomy/Enterostomy/Jejunostomy Tube LUQ (Active)        Telemetry (if present):           Hospital Problems:  Principal Problem:    PEG tube malfunction (HCC)  Active Problems:    ADHD    Autism    Depression    GERD (gastroesophageal reflux disease)    Intractable nausea and vomiting    Slow transit constipation  Resolved Problems:    * No resolved hospital problems. *      Objective:   Patient Vitals for the past 24 hrs:   Temp Pulse Resp BP SpO2   02/07/23 0715 98.3 F (36.8 C) 59 18 112/67 92 %   02/06/23 1900 98.1 F (36.7 C) 83 20 118/73 --   02/06/23 1500 98.5 F (36.9 C) 79 16 109/68 98 %       Oxygen Therapy  SpO2: 92 %  O2 Device: None (Room air)    Estimated body mass index is 20.8 kg/m as calculated from the following:    Height as of this encounter: 1.651 m (5' 5).    Weight as of this encounter: 56.7 kg (125 lb).  No intake or output data in the 24 hours ending 02/07/23 1459        Physical Exam:     General:    Well developed in NAD  Head:  Normocephalic, atraumatic  Eyes:  Sclerae appear normal.  Pupils equally round.  ENT:  Nares appear normal.  Moist oral mucosa  Neck:  Trachea midline   CV:   RRR.  No gross murmur  Lungs:   CTAB.   No wheezing, rhonchi, or rales.  Symmetric expansion.  Abdomen:   Soft, nondistended. +BS x 4; feeding tube in place, no guarding  Extremities: + mvmt x 4  Skin:     No rashes.  Normal coloration.   Warm and dry.    Neuro:  A&Ox3   Psych:  Normal mood and affect.      I have personally reviewed labs and tests:  Recent Labs:  Recent Results (from the past 48 hour(s))   Comprehensive Metabolic Panel w/ Reflex to MG    Collection Time: 02/06/23  5:18 AM   Result Value Ref Range    Sodium 143 136 - 145 mmol/L    Potassium 4.4 3.5 - 5.1 mmol/L    Chloride 110 (H) 98 - 107 mmol/L    CO2 24 20 - 28 mmol/L    Anion Gap 8 (L) 9 - 18 mmol/L    Glucose 132 (H) 70 - 99 mg/dL    BUN 3 (L) 6 - 23 MG/DL    Creatinine 9.35 9.39 - 1.10 MG/DL    Est, Glom Filt Rate >90 >60 ml/min/1.71m2    Calcium 8.8 8.8 - 10.2 MG/DL    Total Bilirubin 0.2 0.0 - 1.2 MG/DL    ALT 7 (L) 8 - 45 U/L    AST 27 15 - 37 U/L    Alk Phosphatase 104 35 - 104 U/L    Total Protein 6.6 6.3 - 8.2 g/dL    Albumin 3.4 (L) 3.5 - 5.0 g/dL    Globulin 3.2 2.3 - 3.5 g/dL    Albumin/Globulin Ratio 1.1 1.0 - 1.9     CBC with Auto Differential    Collection Time: 02/06/23  5:18 AM   Result Value Ref Range    WBC 8.1 4.3 - 11.1 K/uL    RBC 3.93 (L) 4.05 - 5.2 M/uL    Hemoglobin 11.4 (L) 11.7 - 15.4 g/dL    Hematocrit 65.1 (L) 35.8 - 46.3 %    MCV 88.5 82 - 102 FL    MCH 29.0 26.1 - 32.9 PG    MCHC 32.8 31.4 - 35.0 g/dL    RDW 86.4 88.0 - 85.3 %    Platelets 308 150 - 450 K/uL    MPV 10.4  9.4 - 12.3 FL    nRBC 0.00 0.0 - 0.2 K/uL    Differential Type AUTOMATED      Neutrophils % 53 43 - 78 %    Lymphocytes % 27 13 - 44 %    Monocytes % 11 4.0 - 12.0 %    Eosinophils % 7 0.5 - 7.8 %    Basophils % 1 0.0 - 2.0 %    Immature Granulocytes % 1 0.0 - 5.0 %    Neutrophils Absolute 4.4 1.7 - 8.2 K/UL    Lymphocytes Absolute 2.2 0.5 - 4.6 K/UL    Monocytes Absolute 0.9 0.1 - 1.3 K/UL    Eosinophils Absolute 0.5 0.0 - 0.8 K/UL    Basophils Absolute 0.1 0.0 - 0.2 K/UL    Immature  Granulocytes Absolute 0.0 0.0 - 0.5 K/UL   Magnesium     Collection Time: 02/06/23  5:18 AM   Result Value Ref Range    Magnesium  1.9 1.8 - 2.4 mg/dL   Phosphorus    Collection Time: 02/06/23  5:18 AM   Result Value Ref Range    Phosphorus 2.9 2.5 - 4.5 MG/DL       No results for input(s): COVID19 in the last 72 hours.    Current Meds:  Current Facility-Administered Medications   Medication Dose Route Frequency    polyethylene glycol (GoLYTELY ) solution 4,000 mL  4,000 mL Oral Once    iopamidol  (ISOVUE -370) 76 % injection 100 mL  100 mL IntraVENous ONCE PRN    guanFACINE  (INTUNIV ) extended release tablet 4 mg (Patient Supplied)  4 mg Oral Daily    LORazepam  (ATIVAN ) 0.5 mg in sodium chloride  (PF) 0.9 % 10 mL injection  0.5 mg IntraVENous Q6H PRN    sodium phosphate  15 mmol in sodium chloride  0.9 % 250 mL IVPB  15 mmol IntraVENous PRN    buPROPion  (WELLBUTRIN  XL) extended release tablet 150 mg  150 mg Oral Daily    traZODone  (DESYREL ) tablet 100 mg  100 mg Oral Nightly    QUEtiapine  (SEROQUEL ) tablet 100 mg  100 mg Oral Nightly    sodium chloride  flush 0.9 % injection 5-40 mL  5-40 mL IntraVENous 2 times per day    sodium chloride  flush 0.9 % injection 5-40 mL  5-40 mL IntraVENous PRN    0.9 % sodium chloride  infusion   IntraVENous PRN    potassium chloride  (KLOR-CON  M) extended release tablet 40 mEq  40 mEq Oral PRN    Or    potassium bicarb-citric acid  (EFFER-K ) effervescent tablet 40 mEq  40 mEq Oral PRN    Or    potassium chloride  10 mEq/100 mL IVPB (Peripheral Line)  10 mEq IntraVENous PRN    magnesium  sulfate 2000 mg in 50 mL IVPB premix  2,000 mg IntraVENous PRN    enoxaparin  (LOVENOX ) injection 40 mg  40 mg SubCUTAneous Q24H    polyethylene glycol (GLYCOLAX ) packet 17 g  17 g Per G Tube Daily PRN    acetaminophen  (TYLENOL ) tablet 650 mg  650 mg Oral Q6H PRN    Or    acetaminophen  (TYLENOL ) suppository 650 mg  650 mg Rectal Q6H PRN    melatonin tablet 3 mg  3 mg Oral Nightly    sennosides-docusate sodium   (SENOKOT-S) 8.6-50 MG tablet 1 tablet  1 tablet PEG Tube BID    ondansetron  (ZOFRAN ) injection 4 mg  4 mg IntraVENous Q4H PRN    famotidine  (PEPCID ) 20 mg in sodium chloride  (PF) 0.9 %  10 mL injection  20 mg IntraVENous BID    prochlorperazine  (COMPAZINE ) injection 5 mg  5 mg IntraVENous Q6H PRN    linaclotide  (LINZESS ) capsule 290 mcg (patient supplied) (Patient Supplied)  290 mcg Oral QAM AC    Tenapanor  HCl TABS 50 mg (patient supplied) (Patient Supplied)  50 mg Oral BID    Prucalopride Succinate  (MOTEGRITY ) tablet 2 mg (patient supplied) (Patient Supplied)  2 mg Oral Daily    bisacodyl  (DULCOLAX) suppository 10 mg  10 mg Rectal Daily    ketorolac  (TORADOL ) injection 30 mg  30 mg IntraVENous Q6H PRN    busPIRone  (BUSPAR ) tablet 10 mg  10 mg Oral BID    promethazine  (PHENERGAN ) tablet 50 mg  50 mg PEG Tube Q6H PRN    diatrizoate  meglumine -sodium (GASTROGRAFIN ) 66-10 % solution 15 mL  15 mL Oral ONCE PRN       Signed:  Jeanne Terrance Y Aizik Reh, [place]

## 2023-02-07 NOTE — Progress Notes (Signed)
 Pt's mother expressed her frustrations about daughters POC and  wanted to speak to GI MD again to explain in detail POC d/t pt's scan on 9/20.  MD and charge RN made aware.  MD Ertem called and placed on speaker, this RN and charge also present while MD went into great detail about pt's POC, answered all of mother's questions and concerns.  Mom verbalized that she was feeling better and agreed to POC MD Ertem had  recommended at this time.

## 2023-02-07 NOTE — Progress Notes (Signed)
 Pt's mother requested to talk with GI MD for plan of care with daughter.  Dr Karleen Dolphin was called and placed on speaker for pt,  mother and RN to discuss plan of care.  Pt's mother verbally agreed to MD POC.

## 2023-02-07 NOTE — Progress Notes (Signed)
 Hourly rounding completed during shift. All needs met at this time. Golytely  started and tolerating at this time. Had abd pain, was medicated per [facility] and was effective per pt.  Bed L/L with call bell in reach, family at bedside.  Report given to oncoming RN.

## 2023-02-07 NOTE — Consults (Signed)
 Nutrition Note:   Tube Feeding Management (GI)/RN generated-Per Dr Caldwell ok to use G/J tube    See RD consult/not of 9/26:   No labs for today.    Plan: TF to be per home regimen, pt may administer own formula and water  or liquid IV in water .     Home regime as follows: Compleat Peptide Plant Based with various infusion typically between 40-50 ml/hr for 3 carton infusion.  She infuses water  with liquid IV during the day.  She is able to tolerate a higher rate of infusion of water /fluid up to 75 ml/hr.       Greig Hummer, RD,LD, CNSC [phone]

## 2023-02-08 MED ORDER — SODIUM CHLORIDE 0.9 % IV SOLN
0.9 | INTRAVENOUS | Status: DC
Start: 2023-02-08 — End: 2023-02-11

## 2023-02-08 MED ORDER — SODIUM CHLORIDE 0.9 % IV SOLN
0.9 | INTRAVENOUS | Status: DC | PRN
Start: 2023-02-08 — End: 2023-02-11

## 2023-02-08 MED ORDER — NORMAL SALINE FLUSH 0.9 % IV SOLN
0.9 | INTRAVENOUS | Status: DC | PRN
Start: 2023-02-08 — End: 2023-02-11

## 2023-02-08 MED ORDER — NORMAL SALINE FLUSH 0.9 % IV SOLN
0.9 | Freq: Two times a day (BID) | INTRAVENOUS | Status: DC
Start: 2023-02-08 — End: 2023-02-11

## 2023-02-08 MED ADMIN — QUEtiapine (SEROQUEL) tablet 100 mg: 100 mg | ORAL | @ 01:00:00 | NDC 00904664061

## 2023-02-08 MED ADMIN — 0.9 % sodium chloride infusion: INTRAVENOUS | @ 18:00:00 | NDC 00338004904

## 2023-02-08 MED ADMIN — busPIRone (BUSPAR) tablet 10 mg: 10 mg | ORAL | @ 14:00:00 | NDC 00904712161

## 2023-02-08 MED ADMIN — ondansetron (ZOFRAN) injection 4 mg: 4 mg | INTRAVENOUS | @ 22:00:00 | NDC 23155054831

## 2023-02-08 MED ADMIN — sennosides-docusate sodium (SENOKOT-S) 8.6-50 MG tablet 1 tablet: 1 | GASTROSTOMY | @ 01:00:00 | NDC 00536124801

## 2023-02-08 MED ADMIN — buPROPion (WELLBUTRIN XL) extended release tablet 150 mg: 150 mg | ORAL | @ 14:00:00 | NDC 68180031909

## 2023-02-08 MED ADMIN — sodium chloride flush 0.9 % injection 5-40 mL: 10 mL | INTRAVENOUS | @ 01:00:00

## 2023-02-08 MED ADMIN — linaclotide (LINZESS) capsule 290 mcg (patient supplied) (Patient Supplied): 290 ug | ORAL | @ 14:00:00 | NDC 00456120230

## 2023-02-08 MED ADMIN — sennosides-docusate sodium (SENOKOT-S) 8.6-50 MG tablet 1 tablet: 1 | GASTROSTOMY | @ 14:00:00 | NDC 00536124801

## 2023-02-08 MED ADMIN — prochlorperazine (COMPAZINE) injection 5 mg: 5 mg | INTRAVENOUS | @ 06:00:00 | NDC 00641613501

## 2023-02-08 MED ADMIN — ondansetron (ZOFRAN) injection 4 mg: 4 mg | INTRAVENOUS | @ 14:00:00 | NDC 23155054831

## 2023-02-08 MED ADMIN — guanFACINE (INTUNIV) extended release tablet 4 mg  (Patient Supplied): 4 mg | ORAL | @ 14:00:00 | NDC 00093596401

## 2023-02-08 MED ADMIN — prochlorperazine (COMPAZINE) injection 5 mg: 5 mg | INTRAVENOUS | @ 19:00:00 | NDC 00641613501

## 2023-02-08 MED ADMIN — Prucalopride Succinate (MOTEGRITY) tablet 2 mg (patient supplied) (Patient Supplied): 2 mg | ORAL | @ 14:00:00 | NDC 54092054701

## 2023-02-08 MED ADMIN — traZODone (DESYREL) tablet 100 mg: 100 mg | ORAL | @ 01:00:00 | NDC 60687044311

## 2023-02-08 MED ADMIN — busPIRone (BUSPAR) tablet 10 mg: 10 mg | ORAL | @ 01:00:00 | NDC 00904712161

## 2023-02-08 MED ADMIN — famotidine (PEPCID) 20 mg in sodium chloride (PF) 0.9 % 10 mL injection: 20 mg | INTRAVENOUS | @ 14:00:00 | NDC 63323073911

## 2023-02-08 MED ADMIN — famotidine (PEPCID) 20 mg in sodium chloride (PF) 0.9 % 10 mL injection: 20 mg | INTRAVENOUS | @ 01:00:00 | NDC 63323073911

## 2023-02-08 MED FILL — BUSPIRONE HCL 10 MG PO TABS: 10 MG | ORAL | Qty: 1

## 2023-02-08 MED FILL — ONDANSETRON HCL 4 MG/2ML IJ SOLN: 4 MG/2ML | INTRAMUSCULAR | Qty: 2

## 2023-02-08 MED FILL — FAMOTIDINE (PF) 20 MG/2ML IV SOLN: 20 MG/2ML | INTRAVENOUS | Qty: 2

## 2023-02-08 MED FILL — QUETIAPINE FUMARATE 100 MG PO TABS: 100 MG | ORAL | Qty: 1

## 2023-02-08 MED FILL — LOVENOX 40 MG/0.4ML IJ SOSY: 40 MG/0.4ML | INTRAMUSCULAR | Qty: 0.4

## 2023-02-08 MED FILL — PROCHLORPERAZINE EDISYLATE 10 MG/2ML IJ SOLN: 10 MG/2ML | INTRAMUSCULAR | Qty: 2

## 2023-02-08 MED FILL — MELATONIN 3 MG PO TABS: 3 MG | ORAL | Qty: 1

## 2023-02-08 MED FILL — TRAZODONE HCL 50 MG PO TABS: 50 MG | ORAL | Qty: 2

## 2023-02-08 MED FILL — SODIUM CHLORIDE (PF) 0.9 % IJ SOLN: 0.9 % | INTRAMUSCULAR | Qty: 10

## 2023-02-08 MED FILL — SENNOSIDES-DOCUSATE SODIUM 8.6-50 MG PO TABS: ORAL | Qty: 1

## 2023-02-08 MED FILL — BUPROPION HCL ER (XL) 150 MG PO TB24: 150 MG | ORAL | Qty: 1

## 2023-02-08 NOTE — Progress Notes (Signed)
 Hospitalist Progress Note   Admit Date:  02/04/2023  7:40 PM   Name:  Robin Mendoza   Age:  22 y.o.  Sex:  female  DOB:  02/15/2001   MRN:  [id number]   Room:  629/01    Presenting/Chief Complaint: No chief complaint on file.     Reason(s) for Admission: PEG tube malfunction [facility]) [facility]     Hospital Course:   Robin Mendoza is a 22 y.o. 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 Graig office for intractable nausea and vomiting with concerns for possible GI obstruction. Patient complained of abdominal cramping with significant N/V.      Of note, patient has had an extensive workup at [facility] and [facility] for weight loss and poor oral intake following which she was referred to Dr. Oza for PEG-J tube placement.     Subjective & 24hr Events:   I haven't vomited anything today; I still haven't had a BM.  Pleasant lucid 22y.o. WF sitting up in bed, mother and family friends at bedside    Admits to constipation though abd pains improved with prn toradol .  Denies fever, chills, CP, HA.      Assessment & Plan:     Principal Problem:    Ruled out PEG tube malfunction   - s/p CT A/P and CT enterography without obstructive process  - ongoing mgmt per GI Dr. Verne, appreciate assistance  - current plans as per GI (agreed with by patient and patient's mother) is once pt able to have good BMs then resume TFs    Active Problems:    Nausea / vomiting; established hx of global slow GI transit  - ongoing mgmt per GI  - prn antiemetics       Severe constipation  - continue Golytely  as per GI orders  - once constipation resolves, TF can be resumed      Mild hypokalemia  - resolved; last K+ 4.4 (02/06/2023)  - f/u AM BMP      ADHD, Autism    Depression  - home dose wellbutrin , seroquel , trazodone  as dosed    Anticipated Discharge  Arrangements:   Home once medically stable per GI    PT/OT evals ordered?  Not ordered; patient not expected to need rehab  Diet:  ADULT DIET; Regular; Lactose-Controlled, Vegan  ADULT TUBE FEEDING; PEG/J; Other Tube Feeding (specify); Home formula; Continuous; 40; Yes; 10; Other (specify); per pateint tolerance; 50; 75; Q 1 hour  VTE prophylaxis: Lovenox   Code status: Full Code      Non-peripheral Lines and Tubes (if present):      Gastrostomy/Enterostomy/Jejunostomy Tube LUQ (Active)        Telemetry (if present):           Hospital Problems:  Principal Problem:    PEG tube malfunction (HCC)  Active Problems:    ADHD    Autism    Depression    GERD (gastroesophageal reflux disease)    Intractable nausea and vomiting    Slow transit constipation  Resolved Problems:    * No resolved hospital problems. *      Objective:   Patient Vitals for the past 24 hrs:   Temp Pulse Resp BP SpO2   02/07/23 1937 98.4 F (36.9 C) 80 16 119/78 97 %       Oxygen  Therapy  SpO2: 97 %  O2 Device: None (Room air)    Estimated body mass index is 20.8 kg/m as calculated from the following:    Height as of this encounter: 1.651 m (5' 5).    Weight as of this encounter: 56.7 kg (125 lb).  No intake or output data in the 24 hours ending 02/08/23 1613        Physical Exam:     General:    Well developed in NAD  Head:  Normocephalic, atraumatic  Eyes:  Sclerae appear normal.  Pupils equally round.  ENT:  Nares appear normal.  Moist oral mucosa  Neck:  Trachea midline   CV:   RRR.  No gross murmur  Lungs:   CTAB.  No wheezing, rhonchi, or rales.  Symmetric expansion.  Abdomen:   Soft, nondistended. Hypoactive BS x 4; feeding tube in place, no guarding  Extremities: + mvmt x 4  Skin:     No rashes.  Normal coloration.   Warm and dry.    Neuro:  A&Ox3   Psych:  Normal mood and affect.      I have personally reviewed labs and tests:  Recent Labs:  No results found for this or any previous visit (from the past 48 hour(s)).      No results for  input(s): COVID19 in the last 72 hours.    Current Meds:  Current Facility-Administered Medications   Medication Dose Route Frequency    0.9 % sodium chloride  infusion   IntraVENous Continuous    iopamidol  (ISOVUE -370) 76 % injection 100 mL  100 mL IntraVENous ONCE PRN    guanFACINE  (INTUNIV ) extended release tablet 4 mg (Patient Supplied)  4 mg Oral Daily    LORazepam  (ATIVAN ) 0.5 mg in sodium chloride  (PF) 0.9 % 10 mL injection  0.5 mg IntraVENous Q6H PRN    sodium phosphate  15 mmol in sodium chloride  0.9 % 250 mL IVPB  15 mmol IntraVENous PRN    buPROPion  (WELLBUTRIN  XL) extended release tablet 150 mg  150 mg Oral Daily    traZODone  (DESYREL ) tablet 100 mg  100 mg Oral Nightly    QUEtiapine  (SEROQUEL ) tablet 100 mg  100 mg Oral Nightly    sodium chloride  flush 0.9 % injection 5-40 mL  5-40 mL IntraVENous 2 times per day    sodium chloride  flush 0.9 % injection 5-40 mL  5-40 mL IntraVENous PRN    0.9 % sodium chloride  infusion   IntraVENous PRN    potassium chloride  (KLOR-CON  M) extended release tablet 40 mEq  40 mEq Oral PRN    Or    potassium bicarb-citric acid  (EFFER-K ) effervescent tablet 40 mEq  40 mEq Oral PRN    Or    potassium chloride  10 mEq/100 mL IVPB (Peripheral Line)  10 mEq IntraVENous PRN    magnesium  sulfate 2000 mg in 50 mL IVPB premix  2,000 mg IntraVENous PRN    enoxaparin  (LOVENOX ) injection 40 mg  40 mg SubCUTAneous Q24H    polyethylene glycol (GLYCOLAX ) packet 17 g  17 g Per G Tube Daily PRN    acetaminophen  (TYLENOL ) tablet 650 mg  650 mg Oral Q6H PRN    Or    acetaminophen  (TYLENOL ) suppository 650 mg  650 mg Rectal Q6H PRN    melatonin tablet 3 mg  3 mg Oral Nightly    sennosides-docusate sodium  (SENOKOT-S) 8.6-50 MG tablet 1 tablet  1 tablet PEG Tube BID    ondansetron  (ZOFRAN ) injection 4 mg  4 mg IntraVENous Q4H PRN    famotidine  (PEPCID ) 20 mg in sodium chloride  (PF) 0.9 % 10 mL injection  20 mg IntraVENous BID    prochlorperazine  (COMPAZINE ) injection 5 mg  5 mg IntraVENous Q6H PRN     linaclotide  (LINZESS ) capsule 290 mcg (patient supplied) (Patient Supplied)  290 mcg Oral QAM AC    Tenapanor  HCl TABS 50 mg (patient supplied) (Patient Supplied)  50 mg Oral BID    Prucalopride Succinate  (MOTEGRITY ) tablet 2 mg (patient supplied) (Patient Supplied)  2 mg Oral Daily    ketorolac  (TORADOL ) injection 30 mg  30 mg IntraVENous Q6H PRN    busPIRone  (BUSPAR ) tablet 10 mg  10 mg Oral BID    promethazine  (PHENERGAN ) tablet 50 mg  50 mg PEG Tube Q6H PRN    diatrizoate  meglumine -sodium (GASTROGRAFIN ) 66-10 % solution 15 mL  15 mL Oral ONCE PRN       Signed:  Toy Samarin Y Jaella Weinert, [place]

## 2023-02-08 NOTE — Progress Notes (Signed)
 Hourly rounding completed during shift. All needs met at this time.  Golytely   given per order, pt tolerating well.  Medicated per [facility] for nausea and was effective.  Pt had a BM that was unwitnessed by staff.  Grandmother had dad send photo that he took for this RN to see. Bed L/L with call bell in reach.  Report given to oncoming RN.

## 2023-02-09 LAB — COMPREHENSIVE METABOLIC PANEL
ALT: 8 U/L (ref 8–45)
AST: 21 U/L (ref 15–37)
Albumin/Globulin Ratio: 1.1 (ref 1.0–1.9)
Albumin: 3.1 g/dL — ABNORMAL LOW (ref 3.5–5.0)
Alk Phosphatase: 100 U/L (ref 35–104)
Anion Gap: 8 mmol/L — ABNORMAL LOW (ref 9–18)
BUN: 3 mg/dL — ABNORMAL LOW (ref 6–23)
CO2: 24 mmol/L (ref 20–28)
Calcium: 8.6 mg/dL — ABNORMAL LOW (ref 8.8–10.2)
Chloride: 107 mmol/L (ref 98–107)
Creatinine: 0.74 mg/dL (ref 0.60–1.10)
Est, Glom Filt Rate: >90 mL/min/{1.73_m2} (ref >60)
Globulin: 2.9 g/dL (ref 2.3–3.5)
Glucose: 123 mg/dL — ABNORMAL HIGH (ref 70–99)
Potassium: 3.6 mmol/L (ref 3.5–5.1)
Sodium: 139 mmol/L (ref 136–145)
Total Bilirubin: <0.2 mg/dL (ref 0.0–1.2)
Total Protein: 6 g/dL — ABNORMAL LOW (ref 6.3–8.2)

## 2023-02-09 LAB — CBC WITH AUTO DIFFERENTIAL
Basophils %: 1 % (ref 0.0–2.0)
Basophils Absolute: 0.1 10*3/uL (ref 0.0–0.2)
Eosinophils %: 4 % (ref 0.5–7.8)
Eosinophils Absolute: 0.4 10*3/uL (ref 0.0–0.8)
Hematocrit: 32.8 % — ABNORMAL LOW (ref 35.8–46.3)
Hemoglobin: 10.7 g/dL — ABNORMAL LOW (ref 11.7–15.4)
Immature Granulocytes %: 0 % (ref 0.0–5.0)
Immature Granulocytes Absolute: 0 10*3/uL (ref 0.0–0.5)
Lymphocytes %: 15 % (ref 13–44)
Lymphocytes Absolute: 1.6 10*3/uL (ref 0.5–4.6)
MCH: 29.2 pg (ref 26.1–32.9)
MCHC: 32.6 g/dL (ref 31.4–35.0)
MCV: 89.6 FL (ref 82–102)
MPV: 10.2 FL (ref 9.4–12.3)
Monocytes %: 9 % (ref 4.0–12.0)
Monocytes Absolute: 0.9 10*3/uL (ref 0.1–1.3)
Neutrophils %: 71 % (ref 43–78)
Neutrophils Absolute: 7.5 10*3/uL (ref 1.7–8.2)
Platelets: 251 10*3/uL (ref 150–450)
RBC: 3.66 M/uL — ABNORMAL LOW (ref 4.05–5.2)
RDW: 13.4 % (ref 11.9–14.6)
WBC: 10.5 10*3/uL (ref 4.3–11.1)
nRBC: 0 10*3/uL (ref 0.0–0.2)

## 2023-02-09 LAB — PHOSPHORUS: Phosphorus: 1.8 mg/dL — ABNORMAL LOW (ref 2.5–4.5)

## 2023-02-09 LAB — MAGNESIUM: Magnesium: 1.5 mg/dL — ABNORMAL LOW (ref 1.8–2.4)

## 2023-02-09 MED ORDER — PEG 3350-KCL-NABCB-NACL-NASULF 236 G PO SOLR
236 | Freq: Once | ORAL | Status: AC
Start: 2023-02-09 — End: 2023-02-09

## 2023-02-09 MED ORDER — MAGNESIUM SULFATE 4000 MG/100 ML IVPB PREMIX
4 | Freq: Once | INTRAVENOUS | Status: AC
Start: 2023-02-09 — End: 2023-02-09

## 2023-02-09 MED ORDER — PEG 3350-KCL-NABCB-NACL-NASULF 236 G PO SOLR
236 | Freq: Once | ORAL | Status: DC
Start: 2023-02-09 — End: 2023-02-11

## 2023-02-09 MED ORDER — FLEET ENEMA 7-19 GM/118ML RE ENEM
7-19 | Freq: Once | RECTAL | Status: AC | PRN
Start: 2023-02-09 — End: 2023-02-10

## 2023-02-09 MED ADMIN — traZODone (DESYREL) tablet 100 mg: 100 mg | ORAL | @ 02:00:00 | NDC 60687044311

## 2023-02-09 MED ADMIN — acetaminophen (TYLENOL) tablet 650 mg: 650 mg | ORAL | @ 12:00:00 | NDC 00904677361

## 2023-02-09 MED ADMIN — 0.9 % sodium chloride infusion: INTRAVENOUS | @ 14:00:00 | NDC 00338004904

## 2023-02-09 MED ADMIN — buPROPion (WELLBUTRIN XL) extended release tablet 150 mg: 150 mg | ORAL | @ 14:00:00 | NDC 68180031909

## 2023-02-09 MED ADMIN — magnesium sulfate 4000 mg in 100 mL IVPB premix: 4000 mg | INTRAVENOUS | @ 14:00:00 | NDC 63323010600

## 2023-02-09 MED ADMIN — ondansetron (ZOFRAN) injection 4 mg: 4 mg | INTRAVENOUS | @ 03:00:00 | NDC 23155054831

## 2023-02-09 MED ADMIN — ketorolac (TORADOL) injection 30 mg: 30 mg | INTRAVENOUS | @ 14:00:00 | NDC 63323016243

## 2023-02-09 MED ADMIN — polyethylene glycol (GoLYTELY) solution 4,000 mL: 4000 mL | ORAL | @ 17:00:00 | NDC 43386009019

## 2023-02-09 MED ADMIN — guanFACINE (INTUNIV) extended release tablet 4 mg  (Patient Supplied): 4 mg | ORAL | @ 14:00:00 | NDC 00093596401

## 2023-02-09 MED ADMIN — QUEtiapine (SEROQUEL) tablet 100 mg: 100 mg | ORAL | @ 02:00:00 | NDC 00904664061

## 2023-02-09 MED ADMIN — ketorolac (TORADOL) injection 30 mg: 30 mg | INTRAVENOUS | @ 23:00:00 | NDC 63323016243

## 2023-02-09 MED ADMIN — Prucalopride Succinate (MOTEGRITY) tablet 2 mg (patient supplied) (Patient Supplied): 2 mg | ORAL | @ 14:00:00 | NDC 54092054701

## 2023-02-09 MED ADMIN — famotidine (PEPCID) 20 mg in sodium chloride (PF) 0.9 % 10 mL injection: 20 mg | INTRAVENOUS | @ 14:00:00 | NDC 63323073911

## 2023-02-09 MED ADMIN — prochlorperazine (COMPAZINE) injection 5 mg: 5 mg | INTRAVENOUS | @ 23:00:00 | NDC 00641613501

## 2023-02-09 MED ADMIN — sodium chloride flush 0.9 % injection 5-40 mL: 10 mL | INTRAVENOUS | @ 14:00:00

## 2023-02-09 MED ADMIN — sennosides-docusate sodium (SENOKOT-S) 8.6-50 MG tablet 1 tablet: 1 | GASTROSTOMY | @ 14:00:00 | NDC 00536124801

## 2023-02-09 MED ADMIN — sennosides-docusate sodium (SENOKOT-S) 8.6-50 MG tablet 1 tablet: 1 | GASTROSTOMY | @ 02:00:00 | NDC 00536124801

## 2023-02-09 MED ADMIN — busPIRone (BUSPAR) tablet 10 mg: 10 mg | ORAL | @ 14:00:00 | NDC 00904712161

## 2023-02-09 MED ADMIN — prochlorperazine (COMPAZINE) injection 5 mg: 5 mg | INTRAVENOUS | @ 17:00:00 | NDC 00641613501

## 2023-02-09 MED ADMIN — famotidine (PEPCID) 20 mg in sodium chloride (PF) 0.9 % 10 mL injection: 20 mg | INTRAVENOUS | @ 02:00:00 | NDC 63323073911

## 2023-02-09 MED ADMIN — ondansetron (ZOFRAN) injection 4 mg: 4 mg | INTRAVENOUS | @ 14:00:00 | NDC 23155054831

## 2023-02-09 MED ADMIN — busPIRone (BUSPAR) tablet 10 mg: 10 mg | ORAL | @ 02:00:00 | NDC 00904712161

## 2023-02-09 MED FILL — KETOROLAC TROMETHAMINE 30 MG/ML IJ SOLN: 30 MG/ML | INTRAMUSCULAR | Qty: 1

## 2023-02-09 MED FILL — PROCHLORPERAZINE EDISYLATE 10 MG/2ML IJ SOLN: 10 MG/2ML | INTRAMUSCULAR | Qty: 2

## 2023-02-09 MED FILL — BUPROPION HCL ER (XL) 150 MG PO TB24: 150 MG | ORAL | Qty: 1

## 2023-02-09 MED FILL — ONDANSETRON HCL 4 MG/2ML IJ SOLN: 4 MG/2ML | INTRAMUSCULAR | Qty: 2

## 2023-02-09 MED FILL — FAMOTIDINE (PF) 20 MG/2ML IV SOLN: 20 MG/2ML | INTRAVENOUS | Qty: 2

## 2023-02-09 MED FILL — GAVILYTE-G 236 G PO SOLR: 236 g | ORAL | Qty: 4000

## 2023-02-09 MED FILL — SODIUM CHLORIDE (PF) 0.9 % IJ SOLN: 0.9 % | INTRAMUSCULAR | Qty: 10

## 2023-02-09 MED FILL — MAGNESIUM SULFATE 4 GM/100ML IV SOLN: 4 GM/100ML | INTRAVENOUS | Qty: 100

## 2023-02-09 MED FILL — MELATONIN 3 MG PO TABS: 3 MG | ORAL | Qty: 1

## 2023-02-09 MED FILL — BUSPIRONE HCL 10 MG PO TABS: 10 MG | ORAL | Qty: 1

## 2023-02-09 MED FILL — SENNOSIDES-DOCUSATE SODIUM 8.6-50 MG PO TABS: ORAL | Qty: 1

## 2023-02-09 MED FILL — TRAZODONE HCL 50 MG PO TABS: 50 MG | ORAL | Qty: 2

## 2023-02-09 MED FILL — ACETAMINOPHEN 325 MG PO TABS: 325 MG | ORAL | Qty: 2

## 2023-02-09 MED FILL — SODIUM PHOSPHATES 15 MMOLE/5ML IV SOLN: 15 MMOLE/5ML | INTRAVENOUS | Qty: 5

## 2023-02-09 MED FILL — ENEMA 7-19 GM/118ML RE ENEM: 7-19 GM/118ML | RECTAL | Qty: 1

## 2023-02-09 MED FILL — QUETIAPINE FUMARATE 100 MG PO TABS: 100 MG | ORAL | Qty: 1

## 2023-02-09 MED FILL — LOVENOX 40 MG/0.4ML IJ SOSY: 40 MG/0.4ML | INTRAMUSCULAR | Qty: 0.4

## 2023-02-09 MED FILL — LINZESS 290MCG CAPS: 290 290 MCG | ORAL | 30 days supply | Qty: 30 | Fill #0 | Status: AC

## 2023-02-09 NOTE — Progress Notes (Addendum)
 Hourly rounds complete this shift, no new complaints at this time, family at bedside, Grandmother giving golytely  per patient/family request , patient reports no BM reported this evening shift bed in low, locked position, call light and bedside table within reach, patient request linzess  be given with morning meds,  all needs met. Report to day shift nurse.

## 2023-02-09 NOTE — Progress Notes (Signed)
 Hospitalist Progress Note   Admit Date:  02/04/2023  7:40 PM   Name:  Robin Mendoza   Age:  22 y.o.  Sex:  female  DOB:  2000-12-29   MRN:  [id number]   Room:  629/01    Presenting/Chief Complaint: No chief complaint on file.     Reason(s) for Admission: PEG tube malfunction [facility]) [facility]     Hospital Course:   Robin Mendoza is a 22 y.o. 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 Graig office for intractable nausea and vomiting with concerns for possible GI obstruction. Patient complained of abdominal cramping with significant N/V.      Of note, patient has had an extensive workup at [facility] and [facility] for weight loss and poor oral intake following which she was referred to Dr. Oza for PEG-J tube placement.     Subjective & 24hr Events:   I ate oatmeal this morning. I had a BM yesterday.  Pleasant lucid 22y.o. WF sitting up in bed, mother at bedside    Admits to loose BM overnight.  Currently denies abd pains, vomiting, CP, dyspnea.      Assessment & Plan:     Principal Problem:    Ruled out PEG tube malfunction   - s/p CT A/P and CT enterography without obstructive process  - ongoing mgmt per GI Dr. 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  Not ordered; patient not expected to need rehab  Diet:  ADULT DIET; Regular; Lactose-Controlled, Vegan  ADULT TUBE FEEDING; PEG/J; Other  Tube Feeding (specify); Home formula; Continuous; 40; Yes; 10; Other (specify); per pateint tolerance; 50; 75; Q 1 hour  VTE prophylaxis: Lovenox   Code status: Full Code      Non-peripheral Lines and Tubes (if present):      Gastrostomy/Enterostomy/Jejunostomy Tube LUQ (Active)     Midline 02/08/23 Left Brachial (Active)       Telemetry (if present):           Hospital Problems:  Principal Problem:    PEG tube malfunction (HCC)  Active Problems:    ADHD    Autism    Depression    GERD (gastroesophageal reflux disease)    Intractable nausea and vomiting    Slow transit constipation  Resolved Problems:    * No resolved hospital problems. *      Objective:   Patient Vitals for the past 24 hrs:   Temp Pulse Resp BP SpO2   02/09/23 0716 98.4 F (36.9 C) 67 16 116/73 96 %   02/09/23 0338 97.7 F (36.5 C) 94 16 117/79 97 %   02/08/23 1927 98.1 F (36.7 C) 77 17 105/72 100 %       Oxygen Therapy  SpO2: 96 %  O2 Device: None (Room air)  Estimated body mass index is 23.3 kg/m as calculated from the following:    Height as of this encounter: 1.651 m (5' 5).    Weight as of this encounter: 63.5 kg (140 lb).  No intake or output data in the 24 hours ending 02/09/23 1624        Physical Exam:     General:    Well developed in NAD  Head:  Normocephalic, atraumatic  Eyes:  Sclerae appear normal.  Pupils equally round.  ENT:  Nares appear normal.  Moist oral mucosa  Neck:  Trachea midline   CV:   RRR.  No gross murmur  Lungs:   CTAB.  No wheezing, rhonchi, or rales.  Symmetric expansion.  Abdomen:   Soft, nondistended. Hypoactive BS x 4 no guarding; feeding tube in place  Extremities: + mvmt x 4  Skin:     No rashes.  Normal coloration.   Warm and dry.    Neuro:  A&Ox3   Psych:  Normal mood and affect.      I have personally reviewed labs and tests:  Recent Labs:  Recent Results (from the past 48 hour(s))   CBC with Auto Differential    Collection Time: 02/08/23  2:01 PM   Result Value Ref Range    WBC 10.5 4.3 - 11.1 K/uL     RBC 3.66 (L) 4.05 - 5.2 M/uL    Hemoglobin 10.7 (L) 11.7 - 15.4 g/dL    Hematocrit 67.1 (L) 35.8 - 46.3 %    MCV 89.6 82 - 102 FL    MCH 29.2 26.1 - 32.9 PG    MCHC 32.6 31.4 - 35.0 g/dL    RDW 86.5 88.0 - 85.3 %    Platelets 251 150 - 450 K/uL    MPV 10.2 9.4 - 12.3 FL    nRBC 0.00 0.0 - 0.2 K/uL    Differential Type AUTOMATED      Neutrophils % 71 43 - 78 %    Lymphocytes % 15 13 - 44 %    Monocytes % 9 4.0 - 12.0 %    Eosinophils % 4 0.5 - 7.8 %    Basophils % 1 0.0 - 2.0 %    Immature Granulocytes % 0 0.0 - 5.0 %    Neutrophils Absolute 7.5 1.7 - 8.2 K/UL    Lymphocytes Absolute 1.6 0.5 - 4.6 K/UL    Monocytes Absolute 0.9 0.1 - 1.3 K/UL    Eosinophils Absolute 0.4 0.0 - 0.8 K/UL    Basophils Absolute 0.1 0.0 - 0.2 K/UL    Immature Granulocytes Absolute 0.0 0.0 - 0.5 K/UL   Magnesium     Collection Time: 02/08/23  2:01 PM   Result Value Ref Range    Magnesium  1.5 (L) 1.8 - 2.4 mg/dL   Comprehensive Metabolic Panel    Collection Time: 02/08/23  2:01 PM   Result Value Ref Range    Sodium 139 136 - 145 mmol/L    Potassium 3.6 3.5 - 5.1 mmol/L    Chloride 107 98 - 107 mmol/L    CO2 24 20 - 28 mmol/L    Anion Gap 8 (L) 9 - 18 mmol/L    Glucose 123 (H) 70 - 99 mg/dL    BUN 3 (L) 6 - 23 MG/DL    Creatinine 9.25 9.39 - 1.10 MG/DL    Est, Glom Filt Rate >90 >60 ml/min/1.102m2    Calcium 8.6 (L) 8.8 - 10.2 MG/DL  Total Bilirubin <0.2 0.0 - 1.2 MG/DL    ALT 8 8 - 45 U/L    AST 21 15 - 37 U/L    Alk Phosphatase 100 35 - 104 U/L    Total Protein 6.0 (L) 6.3 - 8.2 g/dL    Albumin 3.1 (L) 3.5 - 5.0 g/dL    Globulin 2.9 2.3 - 3.5 g/dL    Albumin/Globulin Ratio 1.1 1.0 - 1.9     Phosphorus    Collection Time: 02/08/23  2:01 PM   Result Value Ref Range    Phosphorus 1.8 (L) 2.5 - 4.5 MG/DL         No results for input(s): COVID19 in the last 72 hours.    Current Meds:  Current Facility-Administered Medications   Medication Dose Route Frequency    polyethylene glycol (GoLYTELY ) solution 4,000 mL  4,000 mL Oral Once    0.9 %  sodium chloride  infusion   IntraVENous Continuous    sodium chloride  flush 0.9 % injection 5-40 mL  5-40 mL IntraVENous 2 times per day    sodium chloride  flush 0.9 % injection 5-40 mL  5-40 mL IntraVENous PRN    0.9 % sodium chloride  infusion   IntraVENous PRN    iopamidol  (ISOVUE -370) 76 % injection 100 mL  100 mL IntraVENous ONCE PRN    guanFACINE  (INTUNIV ) extended release tablet 4 mg  (Patient Supplied)  4 mg Oral Daily    LORazepam  (ATIVAN ) 0.5 mg in sodium chloride  (PF) 0.9 % 10 mL injection  0.5 mg IntraVENous Q6H PRN    sodium phosphate  15 mmol in sodium chloride  0.9 % 250 mL IVPB  15 mmol IntraVENous PRN    buPROPion  (WELLBUTRIN  XL) extended release tablet 150 mg  150 mg Oral Daily    traZODone  (DESYREL ) tablet 100 mg  100 mg Oral Nightly    QUEtiapine  (SEROQUEL ) tablet 100 mg  100 mg Oral Nightly    sodium chloride  flush 0.9 % injection 5-40 mL  5-40 mL IntraVENous 2 times per day    sodium chloride  flush 0.9 % injection 5-40 mL  5-40 mL IntraVENous PRN    0.9 % sodium chloride  infusion   IntraVENous PRN    potassium chloride  (KLOR-CON  M) extended release tablet 40 mEq  40 mEq Oral PRN    Or    potassium bicarb-citric acid  (EFFER-K ) effervescent tablet 40 mEq  40 mEq Oral PRN    Or    potassium chloride  10 mEq/100 mL IVPB (Peripheral Line)  10 mEq IntraVENous PRN    magnesium  sulfate 2000 mg in 50 mL IVPB premix  2,000 mg IntraVENous PRN    enoxaparin  (LOVENOX ) injection 40 mg  40 mg SubCUTAneous Q24H    polyethylene glycol (GLYCOLAX ) packet 17 g  17 g Per G Tube Daily PRN    acetaminophen  (TYLENOL ) tablet 650 mg  650 mg Oral Q6H PRN    Or    acetaminophen  (TYLENOL ) suppository 650 mg  650 mg Rectal Q6H PRN    melatonin tablet 3 mg  3 mg Oral Nightly    sennosides-docusate sodium  (SENOKOT-S) 8.6-50 MG tablet 1 tablet  1 tablet PEG Tube BID    ondansetron  (ZOFRAN ) injection 4 mg  4 mg IntraVENous Q4H PRN    famotidine  (PEPCID ) 20 mg in sodium chloride  (PF) 0.9 % 10 mL injection  20 mg IntraVENous BID     prochlorperazine  (COMPAZINE ) injection 5 mg  5 mg IntraVENous Q6H PRN    linaclotide  (LINZESS ) capsule 290 mcg (patient  supplied) (Patient Supplied)  290 mcg Oral QAM AC    Tenapanor  HCl TABS 50 mg (patient supplied) (Patient Supplied)  50 mg Oral BID    Prucalopride Succinate  (MOTEGRITY ) tablet 2 mg (patient supplied) (Patient Supplied)  2 mg Oral Daily    ketorolac  (TORADOL ) injection 30 mg  30 mg IntraVENous Q6H PRN    busPIRone  (BUSPAR ) tablet 10 mg  10 mg Oral BID    promethazine  (PHENERGAN ) tablet 50 mg  50 mg PEG Tube Q6H PRN    diatrizoate  meglumine -sodium (GASTROGRAFIN ) 66-10 % solution 15 mL  15 mL Oral ONCE PRN       Signed:  Tamotsu Wiederholt Y Leonidas Boateng, [place]

## 2023-02-09 NOTE — Plan of Care (Signed)
 Problem: Discharge Planning  Goal: Discharge to home or other facility with appropriate resources  Outcome: Progressing     Problem: Pain  Goal: Verbalizes/displays adequate comfort level or baseline comfort level  Outcome: Progressing

## 2023-02-09 NOTE — Care Coordination (Signed)
 Ambulatory Care Coordination Note     02/09/2023 10:45 AM     Sent email response to the patient's mother regarding current medications that need to be covered/or filled.   Motegrity   Ibsrela   Xolair    Will continue outreach to the patient's mother to answer any further questions or needs.

## 2023-02-09 NOTE — Progress Notes (Signed)
 Visit completed on 9/27.    Pastoral conversation with [name] and dad, at bedside.  She has been experiencing health complications for several years, which has necessitated significant lifestyle changes.  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    Resources are family and pets.  Faith and spiritual background are not a support system.  Hobbies include raising pets at home and gaming with friends.    [name] pleasant and conversational.  Continue to follow as needed.    Rev. Patt Gull, M.Div., [facility]

## 2023-02-09 NOTE — Progress Notes (Signed)
 Comprehensive Nutrition Assessment    Type and Reason for Visit: Reassess  Malnutrition Screening Tool: Malnutrition Screen  Have you recently lost weight without trying?: 14 to 23 pounds (2 points)  Have you been eating poorly because of a decreased appetite?: Yes (1 point)  Malnutrition Screening Tool Score: 3,   Tube Feeding Management (Hospitalists), and Best Practice Alert for Current Home EN/PN    Nutrition Recommendations/Plan:   Continue current oral diet.    Golytely  per GI.    BMP, Mg and Phos ordered for tomorrow am.       Malnutrition Assessment:  Malnutrition Status: At risk for malnutrition (Comment) (GI history, varied tolerance of enteral and oral regimen)  NFPE unremarkable     Nutrition Assessment:  Nutrition History:   Received PPN during admission at [facility] in July d/t constipation and inability to infuse adequate enteral nutrition or sustain volitional oral intake.    01/08/23: GJ placed by Dr. Oza.  At home pt has an extensive bowel regime, uses Compleat Peptide Plant Based with various infusion typically between 40-50 ml/hr for 3 carton infusion.  She infuses water  with liquid IV during the day.  She is able to tolerate a higher rate of infusion of fluid up to 75 ml/hr.  She continues to eat orally with highly variable intake of a vegan diet with preference for sweets.       Do You Have Any Cultural, Religious, or Ethnic Food Preferences?: Yes (Comment) (Vegan)   Weight History: Hx of prior wt loss more consistently at 125# recently per EMR review, confirmed by pt and mom.    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    Admitted from Dr. Graig office intractable nausea and vomiting with concerns for possible GI obstruction.    PEG tube malfunction ruled out, +NV established hx of global slow GI transit, mild hypokalemia,  constipation, ADHD/autism.    Nutrition Interval:  GI following plan as follows:  CT this admission (reviewed by Dr. Oza as well) did not show any abnormalities of the PEG-J. She has significant stool burden on R side of colon with mild wall thickening. She had a hx of colonic inertia; follows with motlity specialist in [facility].   CTA + CT enterography with PO contrast for further evaluation of duodenal narrowing and to have available for next visit with them.     Visit with patient and mom at bedside.  +Midline. Electrolyte replacement underway.  Pt with + BM, abdomen firm.  Pt reports more interest in eating. Consumed oatmeal this am.  Desires to await more bowel function prior to resuming home enteral regimen.  Discussed with Clement, RN and May, [place].      Abdominal Status (last documented by nursing):   Last BM (including prior to admit): 02/06/23 (per pt), GI Symptoms: Nausea   Pertinent Medications: pepcid , linzess , motegrity , senokot, tenapanor  (has been held throughout admission)  Golytelty per GI (4000 ml)    Continuous: none  IVF: NS @ 100ml/hr  Electrolyte Replacement:  9/26: 40 meq effer k, 40 meq klor con 9/30: 4 gm Mg, Phos replacement pending  Pertinent administered PRN: gastrograffin (9/25), zofran  (9/30), compazine  (9/30), phenergan  (9/27)   Pertinent Labs:   Lab Results   Component Value Date/Time    NA 139 02/08/2023 02:01 PM    K 3.6 02/08/2023 02:01 PM  CL 107 02/08/2023 02:01 PM    CO2 24 02/08/2023 02:01 PM    BUN 3 02/08/2023 02:01 PM    CREATININE 0.74 02/08/2023 02:01 PM    GLUCOSE 123 02/08/2023 02:01 PM    CALCIUM 8.6 02/08/2023 02:01 PM    PHOS 1.8 02/08/2023 02:01 PM    MG 1.5 02/08/2023 02:01 PM     Hemoglobin A1C   Date Value Ref Range Status   01/16/2021 5.1 4.8 - 5.6 % Final     Remarkable for: K trending down, Mg and phos depleted, glucose mildly elevated    Current Nutrition Therapies:  ADULT DIET; Regular; Lactose-Controlled, Vegan  ADULT TUBE FEEDING; PEG/J; Other Tube Feeding  (specify); Home formula; Continuous; 40; Yes; 10; Other (specify); per pateint tolerance; 50; 75; Q 1 hour    Current Intake:   Average Meal Intake: 1-25% Average Supplements Intake: None Ordered      Anthropometric Measures:  Height: 165.1 cm (5' 5)  Current Body Wt: 63.5 kg (139 lb 15.9 oz) (9/30), Weight source: Stated  BMI: 23.3, Normal Weight (BMI 18.5-24.9)  Admission Body Weight: 56.7 kg (125 lb) (stated)  Ideal Body Weight (Kg) (Calculated): 57 kg (125 lbs), 100 %  BMI Category Normal Weight (BMI 18.5-24.9)  Estimated Daily Nutrient Needs:  Energy (kcal/day): 1418-1701 (25-30 kcal/kg) (Kcal/kg Weight used: 56.7 kg Admission  Protein (g/day): 57-68 (1-1.2 g.kg) Weight Used: (Admission) 56.7 kg  Fluid (ml/day):   (1 ml/kcal)    Nutrition Diagnosis:   Inadequate oral intake related to altered GI function as evidenced by  (PEG J present on admission for primary needs secondary inadequate oral intake,  gi dysmotility)  Nutrition Interventions:   Food and/or Nutrient Delivery: Continue Current Diet     Coordination of Nutrition Care: Continue to monitor while inpatient      Goals:   Previous Goal Met: Progressing toward Goal(s)  Active Goal: Meet at least 75% of estimated needs, by next RD assessment       Nutrition Monitoring and Evaluation:      Food/Nutrient Intake Outcomes: Food and Nutrient Intake  Physical Signs/Symptoms Outcomes: Biochemical Data, GI Status, Fluid Status or Edema, Weight, Meal Time Behavior    Discharge Planning:    Enteral Nutrition    Britany Callicott T, RD

## 2023-02-09 NOTE — Progress Notes (Signed)
 GASTROENTEROLOGY PROGRESS NOTE     CC: N/V     Interval Events:  No new complaints at this time. Mom at bedside. She has had loose liquid bowel movements since starting the Golytely . Appears that she has been tolerating her feeds a little better.     CTE overall unrevealing     PE:   Vitals:    02/09/23 0716   BP: 116/73   Pulse: 67   Resp: 16   Temp: 98.4 F (36.9 C)   SpO2: 96%      General:  The patient appears well-nourished, and is in no acute distress.    Abdomen:  Soft. No distention.      Labs:  Lab Results   Component Value Date    HGB 10.7 (L) 02/08/2023    WBC 10.5 02/08/2023    PLT 251 02/08/2023    MCV 89.6 02/08/2023    IRON 83 01/31/2021    FERRITIN 90 01/31/2021    TIBC 213 (L) 01/31/2021    CREATININE 0.74 02/08/2023    ALT 8 02/08/2023    AST 21 02/08/2023     CTE- IMPRESSION:  A gastro jejunostomy tube is in place. Hypodense contrast is present within the  small bowel loops. I do not appreciate convincing abnormal small bowel wall  thickening particularly in the distal portion of the duodenum and proximal  jejunum. No bowel obstruction.      Assessment/Plan:      Nausea/Vomiting/Constipation: No obvious issues with PEG-J per Dr. Oza. 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 Patient appears very comfortable at this time resting in best.      - Continue Golytely , discussed patients mom about this plan and recommend another trial of tube feeds to ensure patient doesn't develop dehydration or electrolyte disturbances from excessive Golytely  use.   - Monitor electrolytes closely and ensure patient is well hydrated  - Discussed plan with patient's mom at bedside, they are hoping for more solidified GI plan going forward. . Patient will need close follow up at discharge with her motility specialist   NO PLANS FOR ANY FURTHER IMAGING OR ENDOSCOPY AT THIS TIME. WILL FOLLOW PERIPHERALLY. Discussed with May Suen, [place].     Thank you  for involving the GI service in the care of your patient. Please dont hesitate to call me directly on my cell below with any questions, updates, etc.      -Okey Puller, MD  [phone]   Gastroenterology/Hepatology

## 2023-02-09 NOTE — Telephone Encounter (Signed)
 Spoke with pt mom re MAP testing.  Confirmed with her that the results won't be available until approximately 90 days from send off. Mom verbalized understanding.

## 2023-02-10 LAB — PHOSPHORUS: Phosphorus: 3.6 mg/dL (ref 2.5–4.5)

## 2023-02-10 LAB — BASIC METABOLIC PANEL
Anion Gap: 9 mmol/L (ref 9–18)
BUN: 2 mg/dL — ABNORMAL LOW (ref 6–23)
CO2: 23 mmol/L (ref 20–28)
Calcium: 8.6 mg/dL — ABNORMAL LOW (ref 8.8–10.2)
Chloride: 109 mmol/L — ABNORMAL HIGH (ref 98–107)
Creatinine: 0.67 mg/dL (ref 0.60–1.10)
Est, Glom Filt Rate: >90 mL/min/{1.73_m2} (ref >60)
Glucose: 85 mg/dL (ref 70–99)
Potassium: 3.9 mmol/L (ref 3.5–5.1)
Sodium: 141 mmol/L (ref 136–145)

## 2023-02-10 LAB — MAGNESIUM: Magnesium: 1.7 mg/dL — ABNORMAL LOW (ref 1.8–2.4)

## 2023-02-10 MED ORDER — THIAMINE HCL 100 MG PO TABS
100 | Freq: Every day | ORAL | Status: DC
Start: 2023-02-10 — End: 2023-02-11
  Administered 2023-02-10: 18:00:00 100 mg via ORAL

## 2023-02-10 MED ADMIN — famotidine (PEPCID) 20 mg in sodium chloride (PF) 0.9 % 10 mL injection: 20 mg | INTRAVENOUS | @ 01:00:00 | NDC 63323073911

## 2023-02-10 MED ADMIN — busPIRone (BUSPAR) tablet 10 mg: 10 mg | ORAL | @ 01:00:00 | NDC 00904712161

## 2023-02-10 MED ADMIN — prochlorperazine (COMPAZINE) injection 5 mg: 5 mg | INTRAVENOUS | @ 13:00:00 | NDC 00641613501

## 2023-02-10 MED ADMIN — Prucalopride Succinate (MOTEGRITY) tablet 2 mg (patient supplied) (Patient Supplied): 2 mg | ORAL | @ 13:00:00 | NDC 54092054701

## 2023-02-10 MED ADMIN — sennosides-docusate sodium (SENOKOT-S) 8.6-50 MG tablet 1 tablet: 1 | GASTROSTOMY | @ 01:00:00 | NDC 00536124801

## 2023-02-10 MED ADMIN — 0.9 % sodium chloride infusion: INTRAVENOUS | @ 01:00:00 | NDC 00338004904

## 2023-02-10 MED ADMIN — famotidine (PEPCID) 20 mg in sodium chloride (PF) 0.9 % 10 mL injection: 20 mg | INTRAVENOUS | @ 13:00:00 | NDC 63323073911

## 2023-02-10 MED ADMIN — linaclotide (LINZESS) capsule 290 mcg (patient supplied) (Patient Supplied): 290 ug | ORAL | @ 13:00:00 | NDC 00456120230

## 2023-02-10 MED ADMIN — buPROPion (WELLBUTRIN XL) extended release tablet 150 mg: 150 mg | ORAL | @ 13:00:00 | NDC 68180031909

## 2023-02-10 MED ADMIN — guanFACINE (INTUNIV) extended release tablet 4 mg  (Patient Supplied): 4 mg | ORAL | @ 13:00:00 | NDC 00093596401

## 2023-02-10 MED ADMIN — sennosides-docusate sodium (SENOKOT-S) 8.6-50 MG tablet 1 tablet: 1 | GASTROSTOMY | @ 13:00:00 | NDC 00536124801

## 2023-02-10 MED ADMIN — acetaminophen (TYLENOL) tablet 650 mg: 650 mg | ORAL | @ 13:00:00 | NDC 00904677361

## 2023-02-10 MED ADMIN — busPIRone (BUSPAR) tablet 10 mg: 10 mg | ORAL | @ 13:00:00 | NDC 00904712161

## 2023-02-10 MED ADMIN — traZODone (DESYREL) tablet 100 mg: 100 mg | ORAL | @ 01:00:00 | NDC 60687044311

## 2023-02-10 MED ADMIN — 0.9 % sodium chloride infusion: INTRAVENOUS | @ 11:00:00 | NDC 00338004904

## 2023-02-10 MED ADMIN — ondansetron (ZOFRAN) injection 4 mg: 4 mg | INTRAVENOUS | @ 21:00:00 | NDC 23155054831

## 2023-02-10 MED ADMIN — sodium chloride flush 0.9 % injection 5-40 mL: 10 mL | INTRAVENOUS | @ 13:00:00

## 2023-02-10 MED ADMIN — QUEtiapine (SEROQUEL) tablet 100 mg: 100 mg | ORAL | @ 01:00:00 | NDC 00904664061

## 2023-02-10 MED FILL — TRAZODONE HCL 50 MG PO TABS: 50 MG | ORAL | Qty: 2

## 2023-02-10 MED FILL — BUSPIRONE HCL 10 MG PO TABS: 10 MG | ORAL | Qty: 1

## 2023-02-10 MED FILL — BUPROPION HCL ER (XL) 150 MG PO TB24: 150 MG | ORAL | Qty: 1

## 2023-02-10 MED FILL — THIAMINE HCL 100 MG PO TABS: 100 MG | ORAL | Qty: 1

## 2023-02-10 MED FILL — SENNOSIDES-DOCUSATE SODIUM 8.6-50 MG PO TABS: ORAL | Qty: 1

## 2023-02-10 MED FILL — LOVENOX 40 MG/0.4ML IJ SOSY: 40 MG/0.4ML | INTRAMUSCULAR | Qty: 0.4

## 2023-02-10 MED FILL — ACETAMINOPHEN 325 MG PO TABS: 325 MG | ORAL | Qty: 2

## 2023-02-10 MED FILL — FAMOTIDINE (PF) 20 MG/2ML IV SOLN: 20 MG/2ML | INTRAVENOUS | Qty: 2

## 2023-02-10 MED FILL — SODIUM CHLORIDE (PF) 0.9 % IJ SOLN: 0.9 % | INTRAMUSCULAR | Qty: 10

## 2023-02-10 MED FILL — MELATONIN 3 MG PO TABS: 3 MG | ORAL | Qty: 1

## 2023-02-10 MED FILL — ONDANSETRON HCL 4 MG/2ML IJ SOLN: 4 MG/2ML | INTRAMUSCULAR | Qty: 2

## 2023-02-10 MED FILL — QUETIAPINE FUMARATE 100 MG PO TABS: 100 MG | ORAL | Qty: 1

## 2023-02-10 MED FILL — PROCHLORPERAZINE EDISYLATE 10 MG/2ML IJ SOLN: 10 MG/2ML | INTRAMUSCULAR | Qty: 2

## 2023-02-10 MED FILL — MAGNESIUM SULFATE 2 GM/50ML IV SOLN: 2 GM/50ML | INTRAVENOUS | Qty: 50

## 2023-02-10 MED FILL — MOTEGRITY 2MG TABS: 2 2 MG | ORAL | 90 days supply | Qty: 90 | Fill #0 | Status: AC

## 2023-02-10 NOTE — Progress Notes (Signed)
 Robin Mendoza is 22 y.o. y/o female     Patient seen at bedside; KUB this AM showing minimal retained stool. No BM today, but had loose BM a few days ago. Dad at bedside. Planning to restart tube feedings today. She ate small amount of food today; no vomiting. Does complain of generalized pain. She has completed 4x doses of full golytely  prep and prn enemas during this admission. CT enterography during this admission for evaluation of duodenal narrowing on previous UGIB did not show any abnormalities. Some decline in her magnesium  during admission.    PE:   Vitals:    02/10/23 0817   BP: 132/83   Pulse: 69   Resp: 20   Temp: 98.4 F (36.9 C)   SpO2: 97%      General:  The patient appears well-nourished, and is in no acute distress.    HEENT:  Normocephalic, atraumatic. No sclerae icterus.   Neurologic:  Alert and oriented x3.  Psychiatric: Appropriate mood and affect.    Assessment and Plan:   #Chronic Constipation: Discussed that, with the minimal retained stool, would not continue with frequent bowel preps and enemas especially with risk of dropping her electrolytes/dehydration. Patient is hopeful to go home soon as she is going to [place]  this weekend. Dad asked about bowel regimen at home; continue with movantik/linzess  at home and should call her regular GI doctor if she goes several days without a bowel movements to request golytely  as needed. Would not recommend daily full bowel preps on a regular basis. Overall, needs to follow up with motility specialist as she may benefit from colectomy if recommended, if she has colonic inertia, as medications for constipation will only do so much. In regards to feeding tube, if she is meeting her caloric intake with PO then goal is to discontinue use of feeding tube; would recommend follow up with a dietitian/nutritionist (dad says she has one) to determine this.     From GI standpoint, if she is able to tolerate her TF at goal without any vomiting, then  she can be discharged home with close follow up with her regular GI doctor (Dr. Cheree) and Motility specialist at [facility]. Dad and patient are agreeable to this.     MD to follow.    Electronically signed by Vinita Crape, PA-C.

## 2023-02-10 NOTE — Progress Notes (Signed)
 RN requesting soaps suds enema per patient/family as patient without BM in 2x days; she has received 4x doses of 4L golytely  over past few days. Noted to have loose stools a few days ago per hospitalist. Her magnesium  is low. Will check KUB to look for any stool retention; if no further stool retention then no indication to continue with bowel preps and enemas as this will only worsen her electrolytes/dehydrate her. Full progress note to follow.     Vinita Crape, PA

## 2023-02-10 NOTE — Progress Notes (Signed)
 Hospitalist Progress Note   Admit Date:  02/04/2023  7:40 PM   Name:  Robin Mendoza   Age:  22 y.o.  Sex:  female  DOB:  26-Robin Mendoza-2002   MRN:  [id number]   Room:  629/01    Presenting/Chief Complaint: No chief complaint on file.     Reason(s) for Admission: PEG tube malfunction Robin Mendoza     Hospital Course:   Robin Mendoza is a 22 y.o. 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 Graig office for intractable nausea and vomiting with concerns for possible GI obstruction. Patient complained of abdominal cramping with significant N/V.      Of note, patient has had an extensive workup at [facility] and [facility] for weight loss and poor oral intake following which she was referred to Dr. Oza for PEG-J tube placement.     Subjective & 24hr Events:   I ate some today; I haven't vomited but I did not have another BM either.  Pleasant lucid 22y.o. WF sitting up in bed, father at bedside    No complaints this AM; reports to no further BM since 2 days prior.  Denies vomiting, fever, chills, abd pains.    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  - has had minimal oral intake without further vomiting x 2 days  - prn antiemetics       Severe constipation  - KUB this AM with minimal retained stool  - GI does NOT recommend continuing with frequent bowel preps / enemas (please refer to GI progress notes today for further details of their discussion with patient and patient's father)  - plan is if pt can tolerate TF at goal GI recommends discharge home  - appreciate dietitian evaluation today with TF orders placed;  goal 40cc/hr      Mild hypokalemia / hypomagnesemia / hypophosphatemia  - mag replaced again today; K+ 3.9 this AM  - phos wnl this AM      ADHD, Autism    Depression  - home dose wellbutrin , seroquel , trazodone  as dosed    Anticipated Discharge Arrangements:   Home once medically stable per GI    PT/OT evals ordered?  Not ordered; patient not expected to need rehab  Diet:  ADULT DIET; Regular; Lactose-Controlled, Vegan  ADULT TUBE FEEDING; PEG/J; Other Tube Feeding (specify); home formula - pt supplied Compleat Peptide 1.5; Continuous; 20; Yes; 20; Q 12 hours; 40; 50; Q 3 hours  VTE prophylaxis: Lovenox   Code status: Full Code      Non-peripheral Lines and Tubes (if present):      Gastrostomy/Enterostomy/Jejunostomy Tube LUQ (Active)     Midline 02/08/23 Left Brachial (Active)       Telemetry (if present):           Hospital Problems:  Principal Problem:    PEG tube malfunction (HCC)  Active Problems:    ADHD    Autism    Depression    GERD (gastroesophageal reflux disease)    Intractable nausea and vomiting    Slow transit constipation  Resolved Problems:    * No resolved hospital problems. *      Objective:   Patient Vitals for the past 24 hrs:   Temp Pulse Resp BP SpO2   02/10/23 0817 98.4 F (36.9 C) 69 20 132/83 97 %   02/10/23 0322 98.2 F (36.8 C) 65 15 120/76 95 %   02/09/23 1932 98.6 F (37 C) 59 15 112/85 98 %       Oxygen Therapy  SpO2: 97 %  O2 Device: None (Room air)    Estimated body mass index is 24.21 kg/m as calculated from the following:    Height as of this encounter: 1.651 m (5' 5).    Weight as of this encounter: 66 kg (145 lb 8 oz).    Intake/Output Summary (Last 24 hours) at 02/10/2023 1221  Last data filed at 02/10/2023 0322  Gross per 24 hour   Intake --   Output 1000 ml   Net -1000 ml           Physical Exam:     General:    Well developed in NAD  Head:  Normocephalic, atraumatic  Eyes:  Sclerae appear normal.  Pupils equally round.  ENT:  Nares appear normal.  Moist oral  mucosa  Neck:  Trachea midline   CV:   RRR.  No gross murmur  Lungs:   CTAB.  No wheezing, rhonchi, or rales.  Symmetric expansion.  Abdomen:   Soft, nondistended. Hypoactive BS x 4 no guarding; feeding tube in place  Extremities: + mvmt x 4  Skin:     No rashes.  Normal coloration.   Warm and dry.    Neuro:  A&Ox3   Psych:  Normal mood and affect.      I have personally reviewed labs and tests:  Recent Labs:  Recent Results (from the past 48 hour(s))   CBC with Auto Differential    Collection Time: 02/08/23  2:01 PM   Result Value Ref Range    WBC 10.5 4.3 - 11.1 K/uL    RBC 3.66 (L) 4.05 - 5.2 M/uL    Hemoglobin 10.7 (L) 11.7 - 15.4 g/dL    Hematocrit 67.1 (L) 35.8 - 46.3 %    MCV 89.6 82 - 102 FL    MCH 29.2 26.1 - 32.9 PG    MCHC 32.6 31.4 - 35.0 g/dL    RDW 86.5 88.0 - 85.3 %    Platelets 251 150 - 450 K/uL    MPV 10.2 9.4 - 12.3 FL    nRBC 0.00 0.0 - 0.2 K/uL    Differential Type AUTOMATED      Neutrophils % 71 43 - 78 %    Lymphocytes % 15 13 - 44 %    Monocytes % 9 4.0 - 12.0 %    Eosinophils % 4 0.5 - 7.8 %    Basophils % 1 0.0 - 2.0 %    Immature Granulocytes % 0 0.0 - 5.0 %    Neutrophils Absolute 7.5 1.7 - 8.2 K/UL    Lymphocytes Absolute 1.6 0.5 - 4.6 K/UL    Monocytes Absolute 0.9 0.1 - 1.3 K/UL    Eosinophils Absolute 0.4 0.0 - 0.8 K/UL    Basophils Absolute 0.1 0.0 - 0.2 K/UL    Immature Granulocytes Absolute 0.0 0.0 - 0.5 K/UL   Magnesium     Collection Time: 02/08/23  2:01 PM   Result Value Ref Range    Magnesium  1.5 (L) 1.8 -  2.4 mg/dL   Comprehensive Metabolic Panel    Collection Time: 02/08/23  2:01 PM   Result Value Ref Range    Sodium 139 136 - 145 mmol/L    Potassium 3.6 3.5 - 5.1 mmol/L    Chloride 107 98 - 107 mmol/L    CO2 24 20 - 28 mmol/L    Anion Gap 8 (L) 9 - 18 mmol/L    Glucose 123 (H) 70 - 99 mg/dL    BUN 3 (L) 6 - 23 MG/DL    Creatinine 9.25 9.39 - 1.10 MG/DL    Est, Glom Filt Rate >90 >60 ml/min/1.84m2    Calcium 8.6 (L) 8.8 - 10.2 MG/DL    Total Bilirubin <9.7 0.0 - 1.2 MG/DL     ALT 8 8 - 45 U/L    AST 21 15 - 37 U/L    Alk Phosphatase 100 35 - 104 U/L    Total Protein 6.0 (L) 6.3 - 8.2 g/dL    Albumin 3.1 (L) 3.5 - 5.0 g/dL    Globulin 2.9 2.3 - 3.5 g/dL    Albumin/Globulin Ratio 1.1 1.0 - 1.9     Phosphorus    Collection Time: 02/08/23  2:01 PM   Result Value Ref Range    Phosphorus 1.8 (L) 2.5 - 4.5 MG/DL   Basic Metabolic Panel    Collection Time: 02/10/23  6:54 AM   Result Value Ref Range    Sodium 141 136 - 145 mmol/L    Potassium 3.9 3.5 - 5.1 mmol/L    Chloride 109 (H) 98 - 107 mmol/L    CO2 23 20 - 28 mmol/L    Anion Gap 9 9 - 18 mmol/L    Glucose 85 70 - 99 mg/dL    BUN 2 (L) 6 - 23 MG/DL    Creatinine 9.32 9.39 - 1.10 MG/DL    Est, Glom Filt Rate >90 >60 ml/min/1.56m2    Calcium 8.6 (L) 8.8 - 10.2 MG/DL   Magnesium     Collection Time: 02/10/23  6:54 AM   Result Value Ref Range    Magnesium  1.7 (L) 1.8 - 2.4 mg/dL   Phosphorus    Collection Time: 02/10/23  6:54 AM   Result Value Ref Range    Phosphorus 3.6 2.5 - 4.5 MG/DL         No results for input(s): COVID19 in the last 72 hours.    Current Meds:  Current Facility-Administered Medications   Medication Dose Route Frequency    thiamine  tablet 100 mg  100 mg Oral Daily    polyethylene glycol (GoLYTELY ) solution 4,000 mL  4,000 mL Oral Once    sodium phosphate  (FLEET) rectal enema 1 enema  1 enema Rectal Once PRN    0.9 % sodium chloride  infusion   IntraVENous Continuous    sodium chloride  flush 0.9 % injection 5-40 mL  5-40 mL IntraVENous 2 times per day    sodium chloride  flush 0.9 % injection 5-40 mL  5-40 mL IntraVENous PRN    0.9 % sodium chloride  infusion   IntraVENous PRN    iopamidol  (ISOVUE -370) 76 % injection 100 mL  100 mL IntraVENous ONCE PRN    guanFACINE  (INTUNIV ) extended release tablet 4 mg  (Patient Supplied)  4 mg Oral Daily    LORazepam  (ATIVAN ) 0.5 mg in sodium chloride  (PF) 0.9 % 10 mL injection  0.5 mg IntraVENous Q6H PRN    sodium phosphate  15 mmol in sodium chloride  0.9 % 250 mL  IVPB  15 mmol IntraVENous  PRN    buPROPion  (WELLBUTRIN  XL) extended release tablet 150 mg  150 mg Oral Daily    traZODone  (DESYREL ) tablet 100 mg  100 mg Oral Nightly    QUEtiapine  (SEROQUEL ) tablet 100 mg  100 mg Oral Nightly    sodium chloride  flush 0.9 % injection 5-40 mL  5-40 mL IntraVENous 2 times per day    sodium chloride  flush 0.9 % injection 5-40 mL  5-40 mL IntraVENous PRN    0.9 % sodium chloride  infusion   IntraVENous PRN    potassium chloride  (KLOR-CON  M) extended release tablet 40 mEq  40 mEq Oral PRN    Or    potassium bicarb-citric acid  (EFFER-K ) effervescent tablet 40 mEq  40 mEq Oral PRN    Or    potassium chloride  10 mEq/100 mL IVPB (Peripheral Line)  10 mEq IntraVENous PRN    magnesium  sulfate 2000 mg in 50 mL IVPB premix  2,000 mg IntraVENous PRN    enoxaparin  (LOVENOX ) injection 40 mg  40 mg SubCUTAneous Q24H    polyethylene glycol (GLYCOLAX ) packet 17 g  17 g Per G Tube Daily PRN    acetaminophen  (TYLENOL ) tablet 650 mg  650 mg Oral Q6H PRN    Or    acetaminophen  (TYLENOL ) suppository 650 mg  650 mg Rectal Q6H PRN    melatonin tablet 3 mg  3 mg Oral Nightly    sennosides-docusate sodium  (SENOKOT-S) 8.6-50 MG tablet 1 tablet  1 tablet PEG Tube BID    ondansetron  (ZOFRAN ) injection 4 mg  4 mg IntraVENous Q4H PRN    famotidine  (PEPCID ) 20 mg in sodium chloride  (PF) 0.9 % 10 mL injection  20 mg IntraVENous BID    prochlorperazine  (COMPAZINE ) injection 5 mg  5 mg IntraVENous Q6H PRN    linaclotide  (LINZESS ) capsule 290 mcg (patient supplied) (Patient Supplied)  290 mcg Oral QAM AC    Tenapanor  HCl TABS 50 mg (patient supplied) (Patient Supplied)  50 mg Oral BID    Prucalopride Succinate  (MOTEGRITY ) tablet 2 mg (patient supplied) (Patient Supplied)  2 mg Oral Daily    busPIRone  (BUSPAR ) tablet 10 mg  10 mg Oral BID    promethazine  (PHENERGAN ) tablet 50 mg  50 mg PEG Tube Q6H PRN    diatrizoate  meglumine -sodium (GASTROGRAFIN ) 66-10 % solution 15 mL  15 mL Oral ONCE PRN       Signed:  Jabree Pernice Y Damara Klunder, [place]

## 2023-02-10 NOTE — Progress Notes (Signed)
 Hourly rounds complete this shift, no new complaints at this time,  bed in low, locked position, call light and bedside table within reach,  all needs met. Report to day shift nurse.

## 2023-02-10 NOTE — Plan of Care (Signed)
 Problem: Discharge Planning  Goal: Discharge to home or other facility with appropriate resources  Outcome: Progressing     Problem: Pain  Goal: Verbalizes/displays adequate comfort level or baseline comfort level  Outcome: Progressing

## 2023-02-10 NOTE — Progress Notes (Addendum)
 Comprehensive Nutrition Assessment    Type and Reason for Visit: Reassess  Malnutrition Screening Tool: Malnutrition Screen  Have you recently lost weight without trying?: 14 to 23 pounds (2 points)  Have you been eating poorly because of a decreased appetite?: Yes (1 point)  Malnutrition Screening Tool Score: 3,   Tube Feeding Management (Hospitalists), and Best Practice Alert for Current Home EN/PN    Nutrition Recommendations/Plan:   Meals and Snacks:  Diet: Continue current order  Oral Nutriton Supplement Therapy:   Medical food supplement therapy:  None  d/t allergies formulary options, EN for primary needs  Enteral Nutrition:   Enteral Access: PEG/J feed via J port, G port can be used for decompression as needed  Initiate  Formula: Other Tube Feeding (home formula: Compleat Peptide Based  )  Goal Rate: Continuous 40 ml/hr   Initiate  Water  flush  50 ml every 3 hours  Modulars: None not indicated at this time   Enteral regimen at above goal to provide per 24 hours:  1320 calories, 63 grams protein and 1372 ml free fluid.    Above regimen:  Meets protein needs and provides ~1 ml/kcal free fluid, additional calorie needs are met via oral intake   IV Fluids:  Discontinue with TF start  Labs:   EN labs: BMP daily, Mg daily x 3 days at initiation then MWF and Phos daily x 3 days at initiation then MWF.     POC Glucoses/SSI Not indicated  Nutrition Related Medication Management:  Electrolyte Replacement Protocol PRN Continue for Magnesium , Potassium, and Phosphorus      Thiamine  100 mg daily x 7 days (EOT 10/8)  Bowel Regimen  active per provider     Malnutrition Assessment:  Malnutrition Status: At risk for malnutrition (Comment) (GI history, varied tolerance of enteral and oral regimen)  NFPE unremarkable     Nutrition Assessment:  Nutrition History:   Received PPN during admission at [facility] in July d/t constipation and inability to infuse adequate enteral nutrition or sustain volitional oral intake.    01/08/23: GJ  placed by Dr. Oza.  At home pt has an extensive bowel regime, uses Compleat Peptide Plant Based with various infusion typically between 40-50 ml/hr for 3 carton infusion.  She infuses water  with liquid IV during the day.  She is able to tolerate a higher rate of infusion of fluid up to 75 ml/hr.  She continues to eat orally with highly variable intake of a vegan diet with preference for sweets.       Do You Have Any Cultural, Religious, or Ethnic Food Preferences?: Yes (Comment) (Vegan)   Weight History: Hx of prior wt loss more consistently at 125# recently per EMR review, confirmed by pt and mom.    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    Admitted from Dr. Graig office intractable nausea and vomiting with concerns for possible GI obstruction.    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    KUB 10/1: Minimal retained feces in the colon.   Nutrition Interval:  GI following plan as follows:  CT this admission (reviewed by Dr. Oza as well) did not show any abnormalities of the PEG-J. She has significant stool burden on R side of colon with mild wall thickening. She had a hx of  colonic inertia; follows with motlity specialist in [facility].   CTA + CT enterography with PO contrast for further evaluation of duodenal narrowing and to have available for next visit with them.     Discussed with Nimeshika, PA and May, [place] plan to restart home enteral regimen per GI.  Pt with known bowel movement yesterday prior to RD encounter, documentation below is inaccurate due to down time with network outages.  Visit with pt and dad at bedside.  Pt is agreeable to resuming home en regimen, dad phoned for additional product to be brought in from home supply as formulary does not include a  comparable product.  Consuming <25% of oral meals.  IVF infusing at goal.  Will begin with continuous feed her with plan to transition to nocturnal feedings per home regimen.  Discussed with Clement, RN.      Abdominal Status (last documented by nursing):   Last BM (including prior to admit): 02/06/23 (per pt), GI Symptoms: Nausea   Pertinent Medications: pepcid , linzess , motegrity , senokot, tenapanor  (has been held throughout admission)  Golytelty per GI (4000 ml)    Continuous: none  IVF: NS @ 114ml/hr  Electrolyte Replacement:  9/26: 40 meq effer k, 40 meq klor con 9/30: 4 gm Mg, 15 mmol NaPhos given during downtime   Pertinent administered PRN: gastrograffin (9/25), zofran  (9/30), compazine  (9/30), phenergan  (9/27)   Pertinent Labs:   Lab Results   Component Value Date/Time    NA 141 02/10/2023 06:54 AM    K 3.9 02/10/2023 06:54 AM    CL 109 02/10/2023 06:54 AM    CO2 23 02/10/2023 06:54 AM    BUN 2 02/10/2023 06:54 AM    CREATININE 0.67 02/10/2023 06:54 AM    GLUCOSE 85 02/10/2023 06:54 AM    CALCIUM 8.6 02/10/2023 06:54 AM    PHOS 3.6 02/10/2023 06:54 AM    MG 1.7 02/10/2023 06:54 AM     Hemoglobin A1C   Date Value Ref Range Status   01/16/2021 5.1 4.8 - 5.6 % Final     Remarkable for: K trending up, Mg remains mildly depleted doesn't meet criteria for standard prn replacement today discussed with May, PA, Phos corrected.     Current Nutrition Therapies:  ADULT DIET; Regular; Lactose-Controlled, Vegan  ADULT TUBE FEEDING; PEG/J; Other Tube Feeding (specify); Home formula; Continuous; 40; Yes; 10; Other (specify); per pateint tolerance; 50; 75; Q 1 hour    Current Intake:   Average Meal Intake: 1-25% Average Supplements Intake: None Ordered      Anthropometric Measures:  Height: 165.1 cm (5' 5)  Current Body Wt: 66 kg (145 lb 8.1 oz) (10/1), Weight source: Bed Scale  BMI: 24.2, Normal Weight (BMI 18.5-24.9)  Admission Body Weight: 56.7 kg (125 lb) (stated)  Ideal Body Weight (Kg) (Calculated): 57 kg (125 lbs),  100 %  BMI Category Normal Weight (BMI 18.5-24.9)  Estimated Daily Nutrient Needs:  Energy (kcal/day): 1418-1701 (25-30 kcal/kg) (Kcal/kg Weight used: 56.7 kg Admission  Protein (g/day): 57-68 (1-1.2 g.kg) Weight Used: (Admission) 56.7 kg  Fluid (ml/day):   (1 ml/kcal)    Nutrition Diagnosis:   Inadequate oral intake related to altered GI function as evidenced by  (PEG J present on admission for primary needs secondary inadequate oral intake,  gi dysmotility)  Nutrition Interventions:   Food and/or Nutrient Delivery: Continue Current Diet, Start Tube Feeding     Coordination of Nutrition Care: Interdisciplinary Rounds      Goals:   Previous Goal Met:  Progressing toward Goal(s)  Active Goal: Meet at least 75% of estimated needs, by next RD assessment       Nutrition Monitoring and Evaluation:      Food/Nutrient Intake Outcomes: Food and Nutrient Intake, Enteral Nutrition Intake/Tolerance  Physical Signs/Symptoms Outcomes: Biochemical Data, GI Status, Fluid Status or Edema, Meal Time Behavior, Weight    Discharge Planning:    Enteral Nutrition    Armandina Iman T, RD

## 2023-02-11 MED ADMIN — sennosides-docusate sodium (SENOKOT-S) 8.6-50 MG tablet 1 tablet: 1 | GASTROSTOMY | @ 02:00:00 | NDC 00536124801

## 2023-02-11 MED ADMIN — QUEtiapine (SEROQUEL) tablet 100 mg: 100 mg | ORAL | @ 02:00:00 | NDC 00904664061

## 2023-02-11 MED ADMIN — ondansetron (ZOFRAN) injection 4 mg: 4 mg | INTRAVENOUS | @ 02:00:00 | NDC 23155054831

## 2023-02-11 MED ADMIN — famotidine (PEPCID) 20 mg in sodium chloride (PF) 0.9 % 10 mL injection: 20 mg | INTRAVENOUS | @ 02:00:00 | NDC 63323073911

## 2023-02-11 MED ADMIN — busPIRone (BUSPAR) tablet 10 mg: 10 mg | ORAL | @ 02:00:00 | NDC 00904712161

## 2023-02-11 MED ADMIN — traZODone (DESYREL) tablet 100 mg: 100 mg | ORAL | @ 02:00:00 | NDC 60687044311

## 2023-02-11 MED FILL — BUSPIRONE HCL 10 MG PO TABS: 10 MG | ORAL | Qty: 1

## 2023-02-11 MED FILL — FAMOTIDINE (PF) 20 MG/2ML IV SOLN: 20 MG/2ML | INTRAVENOUS | Qty: 2

## 2023-02-11 MED FILL — QUETIAPINE FUMARATE 100 MG PO TABS: 100 MG | ORAL | Qty: 1

## 2023-02-11 MED FILL — SENNOSIDES-DOCUSATE SODIUM 8.6-50 MG PO TABS: ORAL | Qty: 1

## 2023-02-11 MED FILL — ONDANSETRON HCL 4 MG/2ML IJ SOLN: 4 MG/2ML | INTRAMUSCULAR | Qty: 2

## 2023-02-11 MED FILL — TRAZODONE HCL 50 MG PO TABS: 50 MG | ORAL | Qty: 2

## 2023-02-11 MED FILL — THIAMINE HCL 100 MG PO TABS: 100 MG | ORAL | Qty: 1

## 2023-02-11 MED FILL — BUPROPION HCL ER (XL) 150 MG PO TB24: 150 MG | ORAL | Qty: 1

## 2023-02-11 MED FILL — GUANFACINE HCL ER 4MG TB24: 4 4 MG | 90 days supply | Qty: 90 | Fill #3 | Status: AC

## 2023-02-11 NOTE — Discharge Summary (Signed)
 Hospitalist Discharge Summary   Admit Date:  02/04/2023  7:40 PM   DC Note date: 02/11/2023  Name:  Robin Mendoza   Age:  22 y.o.  Sex:  female  DOB:  October 26, 2000   MRN:  [id number]   Room:  629/01  PCP:  Donn Ozell Tonette Mickey., MD    Presenting Complaint: No chief complaint on file.     Initial Admission Diagnosis: PEG tube malfunction (HCC) [K94.23]     Problem List for this Hospitalization (present on admission):    Principal Problem:    PEG tube malfunction (HCC)  Active Problems:    ADHD    Autism    Depression    GERD (gastroesophageal reflux disease)    Intractable nausea and vomiting    Constipation  Resolved Problems:    * No resolved hospital problems. *      Hospital Course:  Robin Mendoza is a 22 y.o. 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 Graig office for intractable nausea and vomiting with concerns for possible GI obstruction. Patient complained of abdominal cramping with significant N/V.       Of note, patient has had an extensive workup at [facility] and [facility] for weight loss and poor oral intake following which she was referred to Dr. Oza for PEG-J tube placement.     PEG tube malfunction ruled out s/p CT A/P and CT enterography without obstructive process.  Pt with underlying global slow GI transit and had notable constipation on CT scan.  Aggressive bowel regimen ordered PER GASTROENTEROLOGY with good results and pt's f/u KUB with interval improvement.  Per gastroenterology's ongoing discussion with patient and patient's mother, plan was to resume TF and advance to goal (25ml/hr) once constipation resolves.  TF resumed on 02/10/2023 and advanced to goal with good toleration by patient (no N/V and did continue to tolerate minimal PO feeds) past 24hrs.  Pt is very anxious for discharge home  as she has plans to travel over the weekend. Pt will f/u with regular GI doctor (Dr. Cheree) and Motility specialist at [facility] and recommend f/u with PCP one week post discharge.  Medically stable for discharge home today.  Plans also discussed with patient's father at bedside along with case mgmt and pt's primary RN.    Disposition: Home  Diet: ADULT DIET; Regular; Lactose-Controlled, Vegan  ADULT TUBE FEEDING; PEG/J; Other Tube Feeding (specify); home formula - pt supplied Compleat Peptide 1.5; Continuous; 20; Yes; 20; Q 12 hours; 40; 50; Q 3 hours  Code Status: Full Code    Follow Ups:  Follow-up Information    None             Time spent in patient discharge and coordination 35 minutes.      Follow up labs/diagnostics (ultimately defer to outpatient provider):  Defer to Follow-up Provider    Plan was discussed with Patient, Parent of patient, and RN, CM .  All questions answered.  Patient was stable at time of discharge.  Instructions given to call a physician or return if any concerns.        Discharge Medication List as of 02/11/2023  1:03 PM        CONTINUE these medications which have NOT CHANGED    Details   busPIRone  (BUSPAR ) 10 MG tablet Take 1 tablet by  mouth 2 times dailyHistorical Med      Cholecalciferol (VITAMIN D3) 125 MCG (5000 UT) TABS Take 125 tablets by mouth dailyHistorical Med      Tenapanor  HCl (IBSRELA ) 50 MG TABS Take 50 tablets by mouth 2 times dailyHistorical Med      bisacodyl  (DULCOLAX) 10 MG suppository Place 1 suppository rectally dailyHistorical Med      Nutritional Supplements (COMPLEAT ORIGINAL PLANT-BASED) LIQD by Enteral routeHistorical Med      linaclotide  (LINZESS ) 290 MCG CAPS capsule Take 1 capsule by mouth every morning (before breakfast)Historical Med      buPROPion  (WELLBUTRIN  XL) 150 MG extended release tablet Take 1 tablet by mouth every morningHistorical Med      QUEtiapine  (SEROQUEL  XR) 200 MG extended release tablet Take 1 tablet by mouth nightly Patient taking  100 mgHistorical Med      omalizumab (XOLAIR) 300 MG/2  ML SOSY injection Inject 600 mg into the skin every 28 daysHistorical Med      traZODone  (DESYREL ) 100 MG tablet Take 1 tablet by mouth nightlyHistorical Med      promethazine  (PHENERGAN ) 25 MG tablet Take 1 tablet by mouth every 6 hours as needed for NauseaHistorical Med      prochlorperazine  (COMPAZINE ) 10 MG tablet Take 1 tablet by mouth every 6 hours as neededHistorical Med      guanFACINE  (INTUNIV ) 4 MG TB24 extended release tablet Take 1 tablet by mouth dailyHistorical Med      Prucalopride Succinate  (MOTEGRITY ) 2 MG TABS Take 1 tablet by mouth dailyHistorical Med      ondansetron  (ZOFRAN ) 4 MG tablet Take 1 tablet by mouth every 8 hours as needed for Nausea or Vomiting, Disp-90 tablet, R-2Normal      EPINEPHrine  0.3 MG/0.3ML SOSY Inject 0.3 mLs into the muscle 1 (one) time if needed (allergic reaction)Historical Med      levonorgestrel (MIRENA, 52 MG,) IUD 52 mg 1 each by IntraUTERine route onceHistorical Med           STOP taking these medications       VYVANSE 60 MG CHEW Comments:   Reason for Stopping:               Some of the medications Jyair Kiraly be marked as stop taking by the system; but in reality patient or family reported already being off these meds; defer to outpatient/prescribing providers.      Procedures done this admission:  * No surgery found *    Consults this admission:  IP CONSULT TO PHARMACY  IP CONSULT TO PHARMACY  IP CONSULT TO PHARMACY  IP CONSULT TO GI  IP CONSULT TO DIETITIAN  IP CONSULT TO DIETITIAN  IP CONSULT TO VASCULAR ACCESS TEAM    Consultants will arrange their own follow ups, if applicable.    Echocardiogram results:  No results found for this or any previous visit.      Diagnostic Imaging/Tests:   XR ABDOMEN (KUB) (SINGLE AP VIEW)    Result Date: 02/10/2023  No acute abnormality. Minimal retained feces in the colon. Electronically signed by Debby LITTIE Beagle    CT ENTEROGRAPHY W CONTRAST    Result Date: 02/05/2023  A gastro  jejunostomy tube is in place. Hypodense contrast is present within the small bowel loops. I do not appreciate convincing abnormal small bowel wall thickening particularly in the distal portion of the duodenum and proximal jejunum. No bowel obstruction. Hyperdense contrast and gas are distending the ascending and transverse colon. The descending and sigmoid colon  are decompressed. The wall in these vicinity again appears thickened. Is there history of Crohn's disease or ulcerative colitis to explain this finding? Left ovarian cyst or dominant follicle. Minimal fluid is seen in the posterior cul-de-sac. Small bilateral pleural effusions with apparent pulmonary vascular congestion. Electronically signed by IDALIA BENDERS    CT ABDOMEN PELVIS W IV CONTRAST Additional Contrast? Radiologist Recommendation (Both oral and IV contrast)    Result Date: 02/05/2023  1.  There is a gastrojejunostomy tube.  The tip of the tube is within the jejunum. 2.  Significant stool burden noted within the right colon.  Relative decompression of the descending colon with diffuse mild wall thickening versus decompression.  No significant pericolonic inflammatory fat stranding.  Cannot exclude colitis.  Correlate clinically.  IUD noted. 3.  Small bilateral pleural fluid collections. Automatic exposure control was used as a dose lowering technique. Radiation Dose: CTDI is 9.63 mGy. DLP is 565.86 mGy-cm. Contrast Type: iopamidol  (ISOVUE -370) 76 . Contrast Volume: 100 mL Report signed on 02/05/2023 (02:57 Eastern Time) Signed by: Lonni Hampson,M.D. Reading Location: 389    [place] Upper GI W Barium Swallow Kub    Result Date: 01/30/2023  1. The above-described findings Cleta Heatley be related to the presence of an intra luminal hematoma/bezoar/foreign body and less likely a tumor in the second portion of the duodenum Electronically signed by Alois FORBES Puff       Labs: Results:       BMP, Mg, Phos Recent Labs     02/10/23  0654   NA 141   K 3.9   CL 109*    CO2 23   ANIONGAP 9   BUN 2*   CREATININE 0.67   LABGLOM >90   CALCIUM 8.6*   GLUCOSE 85   MG 1.7*   PHOS 3.6      CBC No results for input(s): WBC, RBC, HGB, HCT, MCV, MCH, MCHC, RDW, PLT, MPV, NRBC, SEGS, LYMPHOPCT, MONOPCT, BASOPCT, IMMGRAN, LYMPHSABS, EOSABS, MONOSABS, BASOSABS, ABSIMMGRAN in the last 72 hours.    Invalid input(s): EOSRELPCT, SEGSABS   LFT No results for input(s): BILITOT, BILIDIR, ALKPHOS, AST, ALT, LABALBU, GLOB in the last 72 hours.    Invalid input(s): PROT   Cardiac  No results found for: NTPROBNP, TROPHS   Coags Lab Results   Component Value Date/Time    PROTIME 14.0 02/11/2021 12:39 PM    PROTIME 16.7 01/29/2021 05:46 PM    PROTIME 14.4 11/22/2020 04:23 PM    INR 1.0 02/11/2021 12:39 PM    INR 1.3 01/29/2021 05:46 PM    INR 1.1 11/22/2020 04:23 PM    APTT 29.4 02/11/2021 12:39 PM      A1c Lab Results   Component Value Date/Time    LABA1C 5.1 01/16/2021 12:18 PM    EAG 100 01/16/2021 12:18 PM      Lipids Lab Results   Component Value Date/Time    CHOL 121 10/12/2020 12:08 PM    HDL 28 10/12/2020 12:08 PM    CHOLHDLRATIO 4.3 10/12/2020 12:08 PM    TRIG 217 10/12/2020 12:08 PM      Thyroid  Lab Results   Component Value Date/Time    TSHELE 1.30 10/12/2020 12:08 PM        Most Recent UA Lab Results   Component Value Date/Time    COLORU YELLOW 10/22/2019 05:17 PM    PROTEINU TRACE 10/22/2019 05:17 PM    GLUCOSEU Negative 01/26/2021 06:34 AM    GLUCOSEU Negative 10/22/2019 05:17 PM  KETUA >160 10/22/2019 05:17 PM    BILIRUBINUR SMALL 10/22/2019 05:17 PM    BLOODU Negative 01/26/2021 06:34 AM    BLOODU Negative 10/22/2019 05:17 PM    NITRU Negative 10/22/2019 05:17 PM    LEUKOCYTESUR Negative 10/22/2019 05:17 PM    WBCUA Negative 01/26/2021 06:34 AM    WBCUA 0-3 10/22/2019 05:17 PM    RBCUA 0-3 10/22/2019 05:17 PM    BACTERIA TRACE 10/22/2019 05:17 PM    MUCUS TRACE 10/22/2019 05:17 PM        Microbiology:  Results       ** No  results found for the last 336 hours. **            All Labs from Last 24 Hrs:  No results found for this or any previous visit (from the past 24 hour(s)).    No results for input(s): COVID19 in the last 72 hours.    Recent Vital Data:  Patient Vitals for the past 24 hrs:   Temp Pulse Resp BP SpO2   02/10/23 1938 98.1 F (36.7 C) 58 16 125/86 --   02/10/23 1524 98.5 F (36.9 C) 55 16 117/74 96 %       Oxygen Therapy  SpO2: 96 %  O2 Device: None (Room air)    Estimated body mass index is 24.21 kg/m as calculated from the following:    Height as of this encounter: 1.651 m (5' 5).    Weight as of this encounter: 66 kg (145 lb 8 oz).  No intake or output data in the 24 hours ending 02/11/23 1432      Physical Exam:    General:          Well developed in NAD  Head:               Normocephalic, atraumatic  Eyes:               Sclerae appear normal.  Pupils equally round.  ENT:                Nares appear normal.  Moist oral mucosa  Neck:               Trachea midline            CV:                  RRR.  No gross murmur  Lungs:             CTAB.  No wheezing, rhonchi, or rales.  Symmetric expansion.  Abdomen:        Soft, nondistended. Hypoactive BS x 4 no guarding; feeding tube in place  Extremities:+ mvmt x 4  Skin:                No rashes.  Normal coloration.   Warm and dry.    Neuro:             A&Ox3   Psych:             Normal mood and affect.        Signed:  Bellanie Matthew Y Alfrieda Tarry, [place]

## 2023-02-13 LAB — MAGNESIUM: Magnesium: 1.7 mg/dL — ABNORMAL LOW (ref 1.8–2.4)

## 2023-02-13 LAB — BASIC METABOLIC PANEL
Anion Gap: 10 mmol/L (ref 9–18)
BUN: 5 mg/dL — ABNORMAL LOW (ref 6–23)
CO2: 26 mmol/L (ref 20–28)
Calcium: 9.4 mg/dL (ref 8.8–10.2)
Chloride: 104 mmol/L (ref 98–107)
Creatinine: 0.66 mg/dL (ref 0.60–1.10)
Est, Glom Filt Rate: >90 mL/min/{1.73_m2} (ref >60)
Glucose: 89 mg/dL (ref 70–99)
Potassium: 4.2 mmol/L (ref 3.5–5.1)
Sodium: 140 mmol/L (ref 136–145)

## 2023-02-13 LAB — PHOSPHORUS: Phosphorus: 4.6 mg/dL — ABNORMAL HIGH (ref 2.5–4.5)
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
## 2023-02-17 MED FILL — XOLAIR 300MG/2ML SOSY: 300 300 MG/2ML | SUBCUTANEOUS | 28 days supply | Qty: 4 | Fill #0 | Status: AC

## 2023-02-17 NOTE — Telephone Encounter (Signed)
MEDICATION REFILL REQUESTED VIA: Voice Mail    MEDICATION BEING REQUESTED CONFIRMED: Yes - trazodone    LAST WRITTEN/ REFILLED: 01/08/23  PROVIDER: Dr. Cleon Gustin, MD    LAST DOSE TAKEN:   only has one tablet left    PHARMACY CONFIRMED: No    LAST PROVIDER APPOINTMENT: 01/20/23      Future Appointments   Date Time Provider Department Center   02/19/2023  9:30 AM Herbert Spires, Lorelee New, MD [facility]   03/18/2023  1:00 PM Stefano Gaul, PA [facility] None   03/20/2023 10:00 AM Azucena Freed., MD [facility]   04/03/2023 10:15 AM Polley, Bertis Ruddy, MD [facility]

## 2023-02-17 NOTE — Telephone Encounter (Signed)
Pharmacy calling to see if they need to fill both rx for motegrity and Ibsrela. The Pa for Robin Mendoza is still pending so they weren't sure it that is because motegrity was approved.  Please cb

## 2023-02-18 NOTE — Telephone Encounter (Signed)
MEDICATION REFILL REQUESTED VIA: MyChart    MEDICATION BEING REQUESTED CONFIRMED: Yes    LAST WRITTEN/ REFILLED: 01/23/23  PROVIDER: Dr. Cleon Gustin, MD    LAST DOSE TAKEN:  unknown    PHARMACY CONFIRMED: Yes    LAST PROVIDER APPOINTMENT: 01/20/23      LABS COMPLETED LAST:     Future Appointments   Date Time Provider Department Center   02/19/2023  9:30 AM Herbert Spires, Lorelee New, MD [facility]   03/18/2023  1:00 PM Stefano Gaul, [place] [facility] None   03/20/2023 10:00 AM Azucena Freed., MD [facility]   04/03/2023 10:15 AM Polley, Bertis Ruddy, MD [facility]

## 2023-02-19 NOTE — Progress Notes (Signed)
Date of visit   :  02/19/2023  PCP                : Azucena Freed., MD    SUBJECTIVE:       Robin Mendoza is a 22 y.o. female  who is being seen as a follow up for Follow-up    Patient seen during hospitalization in July 2024.    Several GI issues, including chronic N/V in the setting of GERD and prior diagnosis of gastroparesis, ARFID, severe chronic idiopathic constipation, chronic malnutrition now dependent on enteral feeds (PEG-J placed by Dr. Percell Miller).    Current concerns are persistent constipation despite linaclotide + motegrity daily; inability to tolerate tube feeds at goal which then equals inability to meet caloric requirements.    Being evaluated by Dr. 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    Mother and father present today and very involved in her care; very supportive family.    Interval history:  New medications: No  New symptoms:No  Hospitalization:Yes  ED visit:No    Tests already performed:     ANORECTAL MANOMETRY AND BALLOON EXPULSION TEST ([facility]) Aug 2024  High sphincter pressures at rest that corrected with time and dyssynergia on ARM but BET was normal. Rectal hyposensitivity noted with blunted sensation and MTV was low. Consider MR defecagraphy to rule out Dyssenergic defecation and assess structural abnormalities. Consider biofeedback therapy for dyssynergic defecation given high external sphincter pressures.     Imaging:  - Right upper quadrant ultrasound 01/2021:   Unremarkable, no intrahepatic bile duct dilatation.  S/p cholecystectomy.     - CT abdomen pelvis 01/2021:  1. Trace to small pleural effusions in both lung bases.   2. Left upper abdominal small bowel intussusception, most often a transient and   self-limited process.   3. Cholecystectomy.      - KUB 12/2020  FINDINGS:   Bowel: No dilated loops of bowel. Moderate-large volume stool  burden in the right colon.   Soft tissues: IUD overlies the right pelvis. Surgical clips in the right upper quadrant.   Bones: The bony skeleton is intact.   IMPRESSION:   Non-obstructive bowel gas pattern. Moderate-large volume stool burden in the right colon.     - CTe 11/08/20  IMPRESSION:   1. No acute inflammatory changes of the most terminal ileum. However, there is a short segment of distal small bowel occurring approximately 3 cm proximal to the ileocecal valve which demonstrate suggested inflammatory changes. Additional inflammatory changes are seen throughout the colon. Although nonspecific, an inflammatory etiology would be favored.   2. Distended small bowel without evidence for significant obstruction at this time.     - CT ABDOMINAL 10/22/20  Impression  1. New nonspecific thickening of the descending and sigmoid colon with  more proximal gaseous colonic distension; findings may be secondary to an  infectious or inflammatory colitis, which includes the possibility of  underlying inflammatory bowel disease.  2. Small volume pelvic free fluid, increased from prior, which may be  reactive  Please note: Our interpretation of studies performed at an outside  institution is limited by factors including absence of technical specifics  of the image, undisclosed clinical information and the unavailability of  the original interpretation. Specialists at the institution that performed  this study may have access to information not available to [place]  that could  make a difference in the interpretation. We suggest that you obtain the  original interpretation from the site where the study was performed.    - CT A/P w/ contrast 10/22/20 outside   IMPRESSION:   1. Moderate free fluid in the pelvis, nonspecific but may be secondary to   ruptured ovarian cyst.   2. Moderate stool volume in the colon. Apparent mild wall thickening of the   sigmoid colon is most likely due to underdistention.   3. No evidence of colitis,  diverticulitis or bowel obstruction. No   hydronephrosis.     - CT Abd pelvis 09/07/20:  IMPRESSION   1. There is pronounced wall thickening of the terminal ileum, with focal cecal wall thickening. There is diffuse colonic distention and mucosal hyperenhancement from the cecum through the rectum, worst at the rectum. This is   a terminal ileitis and diffuse colitis. In a patient of this age, underlying inflammatory bowel disease is likely.   2. There is no evidence of bowel perforation, obstruction, or intraperitoneal   fluid collection.   3. Full features are as detailed above.     - X-ray UGI 08/17/20  FINDINGS: The scout radiograph is normal. The esophagus is normal in course,   caliber, and mural appearance. No focal strictures or diverticula. There is no   hiatal hernia. Contrast transits the GE junction without delay. There is a   normal rugal fold appearance. The greater and lesser curvatures are intact   without obvious defect. The duodenal bulb and C-sweep of the duodenum are unremarkable. There is normal motility. The patient is able to swallow a barium tablet without delay.   IMPRESSION   Normal upper GI evaluation. No anatomic abnormality appreciated.      Endoscopy:  -Colonoscopy 11/2020:   - The examined portion of the ileum was normal. Biopsied.  - Congested mucosa in the cecum and at the appendiceal orifice. Note recent appendectomy  - Normal mucosa in the proximal rectum, in the mid rectum, in the sigmoid colon, in the descending colon, in the  transverse colon, in the ascending colon and in the cecum.  - Normal mucosa in the ascending colon. Biopsied.  - Normal mucosa in the rectum and in the distal rectum in the area of ulcer . Biopsied.  - A single (solitary) ulcer in the distal rectum. Biopsied. Question etiology, Question stercoral ulcer, prolapse, etc.  no surrounding inflammation,  - Non-bleeding hemorrhoids.  - The examination was otherwise normal.   Pathology: Rectal biopsies with reactive  changes and focal lamina propria fibrosis suggestive of prolapse. No pathologic findings the remainder of the biopsies.       - Colonoscopy 09/2020:   - Normal appearance, however was limited due to poor prep.  No pathologic findings on biopsies.     - EGD 07/2020: (report not available)  - Report not available except for pathology listed below. Per chart review, there was retained food despite patient being NPO for procedure.   - Fragments of benign squamous epithelium with patchy chronic inflammation, features of eosinophilic esophagitis not identified.      - EGD/2021: (report not available)  - Esophageal biopsies with some high-power fields greater than 25 eosinophils.  These numbers of eosinophils, caused by a number of etiologies including reflux esophagitis, although can be associated with eosinophilic esophagitis.    Pending tests:   MR DEFECOGRAPHY    HISTORY:  Social History     Tobacco Use   .  Smoking status: Never   . Smokeless tobacco: Never   Vaping Use   . Vaping status: Never Used   Substance Use Topics   . Alcohol use: No     Alcohol/week: 0.0 standard drinks of alcohol   . Drug use: No       Allergies   Allergen Reactions   . Milk Anaphylactic shock-Allergy     Tolerates acetaminophen   . 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 Cat Dander Itching   . Dog Dander Itching           OBJECTIVE:  Pertinent results:     Vitals:    02/19/23 0926   BP: 116/78   Pulse: 78   Temp: 97.4 F (36.3 C)   TempSrc: Temporal   Weight: 59 kg (130 lb)   Height: 165.1 cm (65")    Body mass index is 21.63 kg/m.    Physical Exam  Constitutional:       General: She is not in acute distress.     Appearance: She is well-developed. She is not diaphoretic.   HENT:      Head: Normocephalic.   Eyes:      General: No scleral icterus.  Pulmonary:      Effort: Pulmonary effort is normal.   Musculoskeletal:      Cervical back: Normal range of motion.    Neurological:      Mental Status: She is alert and oriented to person, place, and time.   Psychiatric:         Behavior: Behavior normal.         Thought Content: Thought content normal.         Laboratory  Lab Results   Component Value Date    WBC 6.5 01/20/2023    HGB 10.9 (L) 01/20/2023    HCT 32.4 (L) 01/20/2023    MCV 86 01/20/2023    PLT 454 (H) 01/20/2023     No results found for: "SEDRATE"  Lab Results   Component Value Date    CRP 3.3 11/27/2022     No results found for: "RETICCTPCT"  Lab Results   Component Value Date    TIBC 261 12/27/2020    FERRITIN 39.0 12/27/2020     Lab Results   Component Value Date    VITAMINB12 941 01/20/2023     Lab Results   Component Value Date    FOLATE 8.0 12/27/2020     Lab Results   Component Value Date    GLUCOSE 107 (H) 01/20/2023    CALCIUM 9.2 01/20/2023    NA 140 01/20/2023    K 4.6 01/20/2023    CO2 20 01/20/2023    CL 105 01/20/2023    BUN 14 01/20/2023    CREATININE 0.61 01/20/2023     No results found for: "AMYLASE"  Lab Results   Component Value Date    LIPASE 42 12/27/2020     Lab Results   Component Value Date    BILITOT <0.2 01/20/2023    BILITOT 0.2 01/02/2023    BILIDIR <0.2 12/05/2022    ALBUMIN 3.9 (L) 01/20/2023    AST 16 01/20/2023    AST 26 01/02/2023    ALT 8 01/20/2023    ALT 6 (L) 01/02/2023    PROT 6.3 01/20/2023    PROT 6.2 (L) 01/02/2023     No results found for: "  HAV", "HEPAIGM", "HEPBIGM", "HEPBCAB", "HBEAG", "HEPCAB"  Lab Results   Component Value Date    ANA Negative 12/27/2020   No results found for: "SMOOTHMUSCAB", "MITOAB"      Problem List Items Addressed This Visit          Digestive    Nondiabetic gastroparesis       Other    Constipation - Primary       ASSESSMENT AND PLAN:    Young patient with very complex and puzzling GI history    MALNUTRITION in the setting of GASTROPARESIS , 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  Continue  on tube feeds; prioritize hydration over calories to avoid additional visits for IV hydration. At night can only go to 80ml/h; during the day, can go to 23ml/h.  She is concerned about her inability to keep her caloric needs with enteral feeds + oral intake. She felt her best while on parenteral nutrition.  Will likely need admission for PPN or TPN initiation in the next few weeks.  Awaiting results of cystic fibrosis genetic testing, which should be back sometime in November.    CONSTIPATION  Given our inability to successfully manage her constipation with our more traditional approaches and combinations, we will trial SUPREP 1 bottle (1/2 of a kit of bowel prep) Q3-4 days via PEG-J, in addition to her already prescribed motegrity and linzess. As she hasn't had a BM now for about 7 days, we will start with 2 bottles Q48H until she starts passing stools, then try to decrease the dose to 1 bottle Q3days.  She can continue to use suppositories and enemas as needed.  Following with Dr. Ree Edman @ [facility] -- plans to start [facility], but not yet approved by insurance.  Agree with continuing with PT as some dyssynergia had been identified.  We discussed the possibility of colectomy as a last resort, patient is not yet open to have this discussion. Would refer to Dr. Rockwell Germany if they decide to explore this option.    Return visit as needed   They know how to reach [place] for updates and further recs.    Participants:  Bailey Mech, MD, Physician Examiner            Robin Mendoza, patient   + mother + father

## 2023-02-19 NOTE — Telephone Encounter (Signed)
Called pharmacy and informed pharmacy that both motegrity and [place]. Pharmacist states motegrity approved yet [name] is still pending. Called [employer] at [phone] to provide additional information, representative on the phone stated "I could not find this member name in our system". Attempting use chat support with covermymeds for form [id number]. No answer from live person.     Rufina Falco PhD, MSN, RN

## 2023-02-23 MED FILL — MOTEGRITY 2MG TABS: 2 2 MG | ORAL | 90 days supply | Qty: 90 | Fill #1 | Status: AC

## 2023-03-03 NOTE — Telephone Encounter (Signed)
covermymeds called in requesting PA update. Rep states PA has been submitted twice to insurance and is still pending. Please call [phone] opt 1 to follow up

## 2023-03-03 NOTE — Telephone Encounter (Signed)
[facility] central bed management office  called inquiring if direct admit is still needed for patient , patient was a no show on yesterday . Dahlia Client would like a return call id direct admit is still required for TPN  [phone] OPTION 2

## 2023-03-03 NOTE — Telephone Encounter (Signed)
Returned call to [employer]. Requesting update on [place] PA. Found fax from [employer] requesting additional information/documents.

## 2023-03-03 NOTE — Telephone Encounter (Signed)
The provided number is for [facility], a pharmacy in [place], [place]. Currently working on getting additional information to [employer] then will update [employer].

## 2023-03-04 MED FILL — BUSPIRONE HCL 10MG TABS: 10 10 MG | 30 days supply | Qty: 60 | Fill #0 | Status: AC

## 2023-03-04 MED FILL — QUETIAPINE FUMARATE 50MG TABS: 50 50 MG | 90 days supply | Qty: 135 | Fill #3 | Status: AC

## 2023-03-04 MED FILL — NA SULFATE-K SUL 17.5-3.13-1.6 SOLN: 17.5-3.13-1.6 17.5-3.13-1.6 GM/177ML | 1 days supply | Qty: 354 | Fill #0 | Status: AC

## 2023-03-04 MED FILL — CYPROHEPTADINE HCL 4MG TABS: 4 4 MG | 90 days supply | Qty: 90 | Fill #3 | Status: AC

## 2023-03-04 NOTE — Telephone Encounter (Signed)
Faxed requested information to [employer].Confirmation fax received.    Rufina Falco PhD, MSN, RN

## 2023-03-06 ENCOUNTER — Emergency Department: Admit: 2023-03-06 | Discharge: 2023-03-11 | Payer: PRIVATE HEALTH INSURANCE

## 2023-03-06 ENCOUNTER — Inpatient Hospital Stay
Admit: 2023-03-06 | Discharge: 2023-03-06 | Disposition: A | Discharge status: Home / Self Care | Payer: PRIVATE HEALTH INSURANCE | Attending: Emergency Medicine

## 2023-03-06 DIAGNOSIS — K9423 Gastrostomy malfunction: Principal | ICD-10-CM

## 2023-03-06 MED ORDER — OXYCODONE HCL 5 MG PO TABS
5 | ORAL | Status: AC
Start: 2023-03-06 — End: ?

## 2023-03-06 MED ORDER — FENTANYL CITRATE (PF) 100 MCG/2ML IJ SOLN
100 | INTRAMUSCULAR | Status: AC | PRN
Start: 2023-03-06 — End: 2023-03-06
  Administered 2023-03-06: 18:00:00 100 via INTRAVENOUS

## 2023-03-06 MED ORDER — DIAZEPAM 2 MG PO TABS
2 | ORAL | Status: AC | PRN
Start: 2023-03-06 — End: 2023-03-06
  Administered 2023-03-06: 17:00:00 5 via ORAL

## 2023-03-06 MED ORDER — DIAZEPAM 5 MG PO TABS
5 | ORAL | Status: AC
Start: 2023-03-06 — End: ?

## 2023-03-06 MED ORDER — IOHEXOL 300 MG/ML IJ SOLN
300 | INTRAMUSCULAR | Status: AC
Start: 2023-03-06 — End: ?

## 2023-03-06 MED ORDER — IOHEXOL 300 MG/ML IJ SOLN
300 | INTRAMUSCULAR | Status: AC | PRN
Start: 2023-03-06 — End: 2023-03-06
  Administered 2023-03-06: 18:00:00 35

## 2023-03-06 MED ORDER — LIDOCAINE VISCOUS HCL 2 % MT SOLN
2 | OROMUCOSAL | Status: AC
Start: 2023-03-06 — End: ?

## 2023-03-06 MED ORDER — OXYCODONE-ACETAMINOPHEN 5-325 MG PO TABS
5-325 | ORAL | Status: AC | PRN
Start: 2023-03-06 — End: 2023-03-11
  Administered 2023-03-06: 19:00:00 1 via ORAL

## 2023-03-06 MED ORDER — FENTANYL CITRATE (PF) 100 MCG/2ML IJ SOLN
100 | INTRAMUSCULAR | Status: AC
Start: 2023-03-06 — End: ?

## 2023-03-06 MED FILL — OMNIPAQUE 300 MG/ML IJ SOLN: 300 MG/ML | INTRAMUSCULAR | Qty: 30

## 2023-03-06 MED FILL — DIAZEPAM 5 MG PO TABS: 5 MG | ORAL | Qty: 1

## 2023-03-06 MED FILL — LIDOCAINE VISCOUS HCL 2 % MT SOLN: 2 % | OROMUCOSAL | Qty: 15

## 2023-03-06 MED FILL — FENTANYL CITRATE (PF) 100 MCG/2ML IJ SOLN: 100 MCG/2ML | INTRAMUSCULAR | Qty: 4

## 2023-03-06 MED FILL — OXYCODONE HCL 5 MG PO TABS: 5 MG | ORAL | Qty: 1

## 2023-03-06 NOTE — Telephone Encounter (Signed)
Followed up with [employer] RE [name] denial on 10/25. Will work on the appeal. [employer] will now contact the patient and provide a 1 month supply pending the appeal. States patient MAY qualify for free drug PAP if the appeal is denied.

## 2023-03-06 NOTE — Op Note (Signed)
[facility]  Department of Interventional Radiology  [phone]        Interventional Radiology Brief Procedure Note    Patient: Robin Mendoza MRN: [id number]  SSN: [id number]    Date of Birth: 2001-02-10  Age: 22 y.o.  Sex: female      Date of Procedure: 03/06/2023    Pre-Procedure Diagnosis: gasroperesis, cracked gj tube.  Gi unable to put on schedule today    Post-Procedure Diagnosis: SAME    Procedure(s): feeding tube exchange    Brief Description of Procedure: as above    Performed By: Cyd Silence, MD     Assistants: None    Anesthesia:Lidocaine    Estimated Blood Loss: Less than 10ml    Specimens:  None    Implants:  23f gj tube    Findings: tube dificult to remove because of the mushroom and tough angle because it was placed by gi.  The j portion almost completely out.  New 60f drain placed with diffuculty.     Complications: None    Recommendations: ok to use     Follow Up: 3-4 months    Signed By: Cyd Silence, MD     March 06, 2023

## 2023-03-06 NOTE — Telephone Encounter (Signed)
Father called lvm stating pt's tube is cracked and leaking. Wants to be advised on next steps. [phone]    Spoke to father after confirming with Dr. Percell Miller      Per Dr. Percell Miller patient is to go to Er to have tube replaced. Advised father of Dr. Audrie Gallus recommendation. Per Father he would prefer to cut the tube himself if he is unable to come to the office to have tube replaced by Dr. Percell Miller. Advised father that by him attempting to do the procedure it could potentially cause further issues. Dr. Percell Miller  does not perform procedures in office, it would have to scheduled. Due to Dr.Oza going out on leave it would take a month or more before patinet can be scheduled with him, it would be best to take patient to Er per recommendation from Oza. Father seem hesitant but finally understood why it is recommend to take patient to Er instead of attempting to cut and reinsert the tube himself. Father did agree to take patient to Er after further explanation.      Donnamae Jude MA

## 2023-03-06 NOTE — Consults (Signed)
G tube exchange completed

## 2023-03-06 NOTE — Discharge Instructions (Addendum)
Follow-up with gastroenterology as needed.

## 2023-03-06 NOTE — OR Nursing (Signed)
Much discussion concerning caps for feeding tube before d/c. Techs Lea as well as Caryn Bee both explained to pt and Dad that no caps are needed. Dad now has clear understanding and he was also given the name of the Feeding Tube from the original packaging. As he requested.                                                                                                                                                                               Recovery period without difficulty. Pt alert and oriented and denies pain. Dressing is clean, dry, and intact. Reviewed discharge instructions with patient and Dad, both verbalized understanding. Pt escorted to  ER parking area by [employer] discharge area around at the ER entrance where he parked. Vital signs and [place] score completed.

## 2023-03-06 NOTE — OR Nursing (Signed)
Prep complete. Patient ready for procedure. Dad at bedside.

## 2023-03-06 NOTE — ED Triage Notes (Signed)
Patient arrived with a complaint of feeding tube leakage. GI doctor informed to come here.

## 2023-03-06 NOTE — ED Provider Notes (Signed)
Emergency Department Provider Note                   PCP:                Azucena Freed., MD               Age: 22 y.o.      Sex: female       ICD-10-CM    1. PEG tube malfunction [facility])  K94.23           DISPOSITION Decision To Discharge 03/06/2023 02:58:11 PM             MDM  Number of Diagnoses or Management Options  PEG tube malfunction (HCC)  Diagnosis management comments: MEDICAL DECISION MAKING  Complexity of Problems Addressed:  1 or more chronic illnesses that pose a threat to life or bodily function.    Data Reviewed and Analyzed:  Category 1:   I reviewed external records: provider visit note from outside specialist.     Category 3: Discussion of management or test interpretation.  The patient presents with PEG tube malfunction. GI was consulted and PA saw the patient promptly in the emergency department. The patient was taken to Endo for replacement.   The management of this patient was discussed with an external consultant.      Risk of Complications and/or Morbidity of Patient Management:  Discussion with external consultants.                Orders Placed This Encounter   Procedures    IR GUIDED REPLACE G TUBE/CECOSTOMY PERC W CONTRAST    Inpatient consult to IR        Medications   diazePAM (VALIUM) tablet (5 mg Oral Given 03/06/23 1324)   oxyCODONE-acetaminophen (PERCOCET) 5-325 MG per tablet (1 tablet Oral Given 03/06/23 1434)   iohexol (OMNIPAQUE) injection (35 mLs Other Given 03/06/23 1410)       New Prescriptions    No medications on file        Robin Mendoza is a 22 y.o. female who presents to the Emergency Department with chief complaint of    Chief Complaint   Patient presents with    GI Problem     Feeding tube leakage      The presents with a leak in her feeding tube. She noted since last night that the distal part of the tubing is cracked open. The patient had the tube placed at the end of August for gastroparesis. She called the GI office and was told to come to the ED  for assistance.     The history is provided by the patient. No language interpreter was used.         Review of Systems    Past Medical History:   Diagnosis Date    ADHD     managed with meds    Anxiety     Autism     Chronic constipation     GERD (gastroesophageal reflux disease)     Weight loss         Past Surgical History:   Procedure Laterality Date    CHOLECYSTECTOMY, LAPAROSCOPIC N/A 10/05/2020    CHOLECYSTECTOMY LAPAROSCOPIC performed by Adalberto Ill, MD at [facility] MAIN OR    COLONOSCOPY N/A 09/17/2020    COLONOSCOPY/BMI 21 performed by Luanna Cole, MD at [facility] ENDOSCOPY    COLONOSCOPY N/A 01/14/2019    COLONOSCOPY/  BMI 21 PT IS  AUTISTIC performed by Luanna Cole, MD at [facility] ENDOSCOPY    EAR SURGERY Bilateral     as a baby    LAPAROSCOPIC APPENDECTOMY N/A 10/05/2020    APPENDECTOMY LAPAROSCOPIC performed by Adalberto Ill, MD at [facility] MAIN OR    UPPER GASTROINTESTINAL ENDOSCOPY      UPPER GASTROINTESTINAL ENDOSCOPY N/A 01/08/2023    ESOPHAGOGASTRODUODENOSCOPY-Gastro Jejunal TUBE INSERTION performed by Archie Patten, MD at [facility] ENDOSCOPY        Family History   Problem Relation Age of Onset    Cancer Mother         bilateral breast cancer    Hypertension Mother     No Known Problems Father     Lymphoma Maternal Grandmother     Leukemia Maternal Grandfather         Social History     Socioeconomic History    Marital status: Single     Spouse name: None    Number of children: None    Years of education: None    Highest education level: None   Tobacco Use    Smoking status: Never    Smokeless tobacco: Never   Substance and Sexual Activity    Alcohol use: Not Currently    Drug use: Never     Social Determinants of Health     Financial Resource Strain: Low Risk  (11/13/2022)    Received from [facility], [facility]    Financial Resource Strain     Difficulty Paying Living Expenses: Not hard at all     Difficulty Paying Medical Expenses: No   Food Insecurity: No Food Insecurity (02/04/2023)    Hunger  Vital Sign     Worried About Running Out of Food in the Last Year: Never true     Ran Out of Food in the Last Year: Never true   Transportation Needs: No Transportation Needs (02/04/2023)    PRAPARE - Therapist, art (Medical): No     Lack of Transportation (Non-Medical): No   Physical Activity: Inactive (11/13/2022)    Received from [facility], [facility]    Physical Activity     Days of Exercise per Week: 0     Minutes of Exercise per Session: 0     Total Minutes of Exercise per Week: 0   Stress: Stress Concern Present (11/13/2022)    Received from [facility], [facility]    Stress     Feeling of Stress : To some extent    Social Connections [facility])   Intimate Partner Violence: Not At Risk (11/13/2022)    Received from [facility], [facility]    Intimate Partner Violence     Fear of Current or Ex-Partner: No     Emotionally Abused: No     Physically Abused: No     Sexually Abused: No   Housing Stability: Low Risk  (02/04/2023)    Housing Stability Vital Sign     Unable to Pay for Housing in the Last Year: No     Number of Times Moved in the Last Year: 1     Homeless in the Last Year: No         Cow's milk, Lactose, Lactulose, Milk protein, Cat hair extract, and Dog epithelium (canis lupus familiaris)     Previous Medications    BISACODYL (DULCOLAX) 10 MG SUPPOSITORY    Place 1 suppository rectally daily    BUPROPION (WELLBUTRIN XL) 150  MG EXTENDED RELEASE TABLET    Take 1 tablet by mouth every morning    BUSPIRONE (BUSPAR) 10 MG TABLET    Take 1 tablet by mouth 2 times daily    CHOLECALCIFEROL (VITAMIN D3) 125 MCG (5000 UT) TABS    Take 125 tablets by mouth daily    EPINEPHRINE 0.3 MG/0.3ML SOSY    Inject 0.3 mLs into the muscle 1 (one) time if needed (allergic reaction)    GUANFACINE (INTUNIV) 4 MG TB24 EXTENDED RELEASE TABLET    Take 1 tablet by mouth daily    LEVONORGESTREL (MIRENA, 52 MG,) IUD 52 MG    1 each by IntraUTERine route once    LINACLOTIDE (LINZESS) 290 MCG CAPS  CAPSULE    Take 1 capsule by mouth every morning (before breakfast)    NUTRITIONAL SUPPLEMENTS (COMPLEAT ORIGINAL PLANT-BASED) LIQD    by Enteral route    OMALIZUMAB (XOLAIR) 300 MG/2  ML SOSY INJECTION    Inject 600 mg into the skin every 28 days    ONDANSETRON (ZOFRAN) 4 MG TABLET    Take 1 tablet by mouth every 8 hours as needed for Nausea or Vomiting    PROCHLORPERAZINE (COMPAZINE) 10 MG TABLET    Take 1 tablet by mouth every 6 hours as needed    PROMETHAZINE (PHENERGAN) 25 MG TABLET    Take 1 tablet by mouth every 6 hours as needed for Nausea    PRUCALOPRIDE SUCCINATE (MOTEGRITY) 2 MG TABS    Take 1 tablet by mouth daily    QUETIAPINE (SEROQUEL XR) 200 MG EXTENDED RELEASE TABLET    Take 1 tablet by mouth nightly Patient taking 100 mg    TENAPANOR HCL (IBSRELA) 50 MG TABS    Take 50 tablets by mouth 2 times daily    TRAZODONE (DESYREL) 100 MG TABLET    Take 1 tablet by mouth nightly        Vitals signs and nursing note reviewed.   Patient Vitals for the past 4 hrs:   Resp BP SpO2   03/06/23 1427 18 108/70 93 %   03/06/23 1300 -- 112/72 (!) 88 %          Physical Exam  Constitutional:       Appearance: Normal appearance.   Cardiovascular:      Rate and Rhythm: Normal rate and regular rhythm.      Pulses: Normal pulses.   Pulmonary:      Effort: Pulmonary effort is normal.      Breath sounds: Normal breath sounds.   Abdominal:      Palpations: Abdomen is soft.      Comments: PEG tube in place. Obvious defect leaking from distal tubing.    Musculoskeletal:         General: No swelling.   Skin:     General: Skin is warm and dry.   Neurological:      General: No focal deficit present.      Mental Status: She is alert and oriented to person, place, and time.   Psychiatric:         Mood and Affect: Mood normal.          Procedures    Results for orders placed or performed during the hospital encounter of 03/06/23   Inpatient consult to IR    Narrative    Ernst Breach, RN     03/06/2023  2:43 PM  G tube exchange  completed  IR GUIDED REPLACE G TUBE/CECOSTOMY PERC W CONTRAST    (Results Pending)                       Voice dictation software was used during the making of this note.  This software is not perfect and grammatical and other typographical errors may be present.  This note has not been completely proofread for errors.     Vincente Poli, MD  03/06/23 [phone]

## 2023-03-06 NOTE — ED Notes (Signed)
TRANSFER - OUT REPORT:    Verbal report given to [facility] RN on Neita Carp  being transferred to IR for routine progression of patient care       Report consisted of patient's Situation, Background, Assessment and   Recommendations(SBAR).     Information from the following report(s) ED SBAR was reviewed with the receiving nurse.    Lines:       Opportunity for questions and clarification was provided.      Patient transported with:  Gerhard Munch, RN  03/06/23 [phone]

## 2023-03-06 NOTE — OR Nursing (Signed)
.  TRANSFER - OUT REPORT:           Verbal report given to Annabelle Harman, RN on Robin Mendoza  being transferred to IR Recovery for routine post-op      Report consisted of patient's Situation, Background, Assessment and Recommendations(SBAR).          Information from the following report(s) SBAR, Procedure Summary, and MAR was reviewed with the receiving nurse.       Opportunity for questions and clarification was provided.              100 Mcg of Fentanyl administered   0 Mg of Versed administered   0 Mg of Benadryl administered        Pt tolerated procedure well.      Feeding Tube:       4 x 4 gauze and Other: tape clean, dry, intact, and nontender    VITALS:  BP 108/70   Pulse 99   Temp 98.2 F (36.8 C) (Oral)   Resp 18   Ht 1.651 m (5\' 5" )   Wt 56.7 kg (125 lb)   SpO2 93%   BMI 20.80 kg/m

## 2023-03-06 NOTE — Progress Notes (Cosign Needed)
Robin Mendoza is 22 y.o. y/o female     Patient is a 22 year old female, with a hx of Autism, GERD, slow transit constipation, gastroparesis, PEG-J for poor nutrition/gastroparesis. Patient had 24Fr Endovive PEG with 12Fr J tube placed on 8/29 for gastroparesis. Patient states that tube started leaking yesterday; cracked area at distal tubing. Patient has some abdominal pain, but chronic given GI history.    PE:   Vitals:    03/06/23 1055   BP:    Pulse:    Resp: 17   Temp:    SpO2:       General:  The patient appears well-nourished, and is in no acute distress.    HEENT:  Normocephalic, atraumatic. No sclerae icterus.   Abdomen: PEG-J in place; cracking to distal tubing. External bumper secured and in place without obvious leaking.   Neurologic:  Alert and oriented x3.  Psychiatric: Appropriate mood and affect.    Assessment and Plan:   #Leaking/Cracked PEG-J: Discussed with Dr. Percell Miller; PEG-J would have to be exchanged under fluoro. Due to schedule limitations, Dr. Percell Miller spoke with IR and they agreed to evaluate patient and possible exchange. Placed IR consult, appreciate assistance.     Electronically signed by Su Monks, PA-C.

## 2023-03-09 ENCOUNTER — Encounter

## 2023-03-10 MED FILL — QUETIAPINE FUMARATE 100MG TABS: 100 100 MG | 90 days supply | Qty: 90 | Fill #0 | Status: AC

## 2023-03-11 MED ORDER — OXYCODONE HCL 5 MG PO TABS
5 MG | ORAL | Status: AC | PRN
Start: 2023-03-11 — End: 2023-03-06
  Administered 2023-03-06: 19:00:00 5 via ORAL

## 2023-03-13 ENCOUNTER — Inpatient Hospital Stay
Admit: 2023-03-13 | Discharge: 2023-03-13 | Disposition: A | Discharge status: Home / Self Care | Payer: PRIVATE HEALTH INSURANCE

## 2023-03-13 ENCOUNTER — Emergency Department: Admit: 2023-03-13 | Payer: PRIVATE HEALTH INSURANCE

## 2023-03-13 DIAGNOSIS — K9420 Gastrostomy complication, unspecified: Principal | ICD-10-CM

## 2023-03-13 DIAGNOSIS — T85848A Pain due to other internal prosthetic devices, implants and grafts, initial encounter: Secondary | ICD-10-CM

## 2023-03-13 MED ORDER — DIATRIZOATE MEGLUMINE & SODIUM 66-10 % PO SOLN
66-10 | Freq: Once | ORAL | Status: DC | PRN
Start: 2023-03-13 — End: 2023-03-13
  Administered 2023-03-13: 22:00:00 30 mL via GASTROSTOMY

## 2023-03-13 MED ORDER — MORPHINE SULFATE (PF) 4 MG/ML IJ SOLN
4 | Freq: Once | INTRAMUSCULAR | Status: AC
Start: 2023-03-13 — End: 2023-03-13
  Administered 2023-03-13: 22:00:00 4 mg via INTRAMUSCULAR

## 2023-03-13 MED FILL — MORPHINE SULFATE 4 MG/ML IJ SOLN: 4 mg/mL | INTRAMUSCULAR | Qty: 1

## 2023-03-13 NOTE — ED Triage Notes (Signed)
Patient arrives to ED pov from home. Patient reports pain around feeding tube site. Patient reports she had it replaced a week ago. Denies fever or chills.

## 2023-03-13 NOTE — Discharge Instructions (Signed)
As we discussed, your G-tube placement is looking okay today.  I would like you to follow-up with your surgeon and/or primary care if symptoms continue as well as to have things rechecked in 1 week.  As we discussed, please return to the emergency department for any new, worsening, concerning symptoms.

## 2023-03-13 NOTE — ED Provider Notes (Signed)
Emergency Department Provider Note       PCP: Azucena Freed., MD   Age: 22 y.o.   Sex: female     DISPOSITION Decision To Discharge 03/13/2023 06:45:33 PM    ICD-10-CM    1. Pain around PEG tube site, initial encounter  T85.848A           Medical Decision Making     In summary's well-appearing nontoxic 22 year old female arriving for concerns of her G-tube noted over the past few days.  Patient reports pain at the site.  No signs of infection.  No drainage.  No surrounding erythema.  It has been functioning properly.  We did obtain an x-ray with contrast and which showed that lies in the duodenum/jejunum.  I reviewed the images as well as Dr. Romualdo Bolk who I consulted to look at the images as well who is in agreement with radiology.  Pain was controlled here.  I instructed her to follow-up with her primary care and/or surgery if symptoms continue.  This plan was discussed with patient and family at bedside in which they are in agreement with.  They are very appreciative.  Very strict return precautions were discussed with patient and family which they verbalized understanding.  ED Course as of 03/13/23 1846   Fri Mar 13, 2023   1820 X-ray per radiology read.  FINDINGS:  Contrast is injected in the G-tube.  This lies in the  Duodenum/Jejunum   [TC]      ED Course User Index  [TC] Alric Quan, APRN - NP     1 acute, uncomplicated illness or injury.  Patient was discharged risks and benefits of hospitalization were considered.  Shared medical decision making was utilized in creating the patients health plan today.  I independently ordered and reviewed each unique test.    I reviewed external records: ED visit note from an outside group.  I reviewed external records: provider visit note from PCP.  I reviewed external records: provider visit note from outside specialist.    history provided by patient.  Patient is alert and orient x 4.    I interpreted the X-rays  .              History     This is a nonacute  appearing 22 year old female arriving for pain around G-tube site.  Patient reports that she had this replaced here approximately 1 week ago.  Patient reports that that has been functioning appropriately.  Patient states that she has not had any drainage around the site.  No erythema around the site.  No nausea or vomiting.  No fevers.  No gross abdominal pain only pain around site.  No other complaints at this time.    ROS     Review of Systems   Constitutional: Negative.    HENT: Negative.     Eyes: Negative.    Respiratory: Negative.     Cardiovascular: Negative.    Gastrointestinal: Negative.    Endocrine: Negative.    Genitourinary: Negative.    Musculoskeletal: Negative.    Skin: Negative.    Allergic/Immunologic: Negative.    Neurological: Negative.    Hematological: Negative.    Psychiatric/Behavioral: Negative.          Physical Exam     Vitals signs and nursing note reviewed:  Vitals:    03/13/23 1714   BP: 108/75   Pulse: (!) 103   Temp: 98.2 F (36.8 C)   SpO2: 98%  Weight: 57.6 kg (127 lb)   Height: 1.651 m (5\' 5" )      Physical Exam  Vitals and nursing note reviewed.   Constitutional:       General: She is not in acute distress.     Appearance: Normal appearance. She is normal weight. She is not ill-appearing or toxic-appearing.   HENT:      Head: Normocephalic.      Right Ear: Tympanic membrane and external ear normal.      Left Ear: Tympanic membrane and external ear normal.      Nose: Nose normal.      Mouth/Throat:      Mouth: Mucous membranes are moist.      Pharynx: Oropharynx is clear.   Eyes:      Extraocular Movements: Extraocular movements intact.      Conjunctiva/sclera: Conjunctivae normal.      Pupils: Pupils are equal, round, and reactive to light.   Cardiovascular:      Rate and Rhythm: Normal rate and regular rhythm.      Pulses: Normal pulses.      Heart sounds: Normal heart sounds.   Pulmonary:      Effort: Pulmonary effort is normal.      Breath sounds: Normal breath sounds.    Abdominal:      Palpations: Abdomen is soft.      Comments: Mild tenderness around G-tube site.  No surrounding erythema.   Musculoskeletal:         General: Normal range of motion.      Cervical back: Normal range of motion.   Skin:     General: Skin is warm.      Capillary Refill: Capillary refill takes less than 2 seconds.   Neurological:      General: No focal deficit present.      Mental Status: She is alert and oriented to person, place, and time. Mental status is at baseline.   Psychiatric:         Mood and Affect: Mood normal.         Behavior: Behavior normal.         Thought Content: Thought content normal.         Judgment: Judgment normal.        Procedures     Procedures    Orders Placed This Encounter   Procedures    XR ABDOMEN (KUB) (SINGLE AP VIEW)        Medications given during this emergency department visit:  Medications   diatrizoate meglumine-sodium (GASTROGRAFIN) 66-10 % solution 30 mL (30 mLs PEG Tube Given 03/13/23 1758)   morphine sulfate (PF) injection 4 mg (4 mg IntraMUSCular Given 03/13/23 1754)       New Prescriptions    No medications on file        Past Medical History:   Diagnosis Date    ADHD     managed with meds    Anxiety     Autism     Chronic constipation     GERD (gastroesophageal reflux disease)     Weight loss         Past Surgical History:   Procedure Laterality Date    CHOLECYSTECTOMY, LAPAROSCOPIC N/A 10/05/2020    CHOLECYSTECTOMY LAPAROSCOPIC performed by Adalberto Ill, MD at [facility] MAIN OR    COLONOSCOPY N/A 09/17/2020    COLONOSCOPY/BMI 21 performed by Luanna Gibril Mastro, MD at [facility] ENDOSCOPY    COLONOSCOPY N/A 01/14/2019  COLONOSCOPY/  BMI 21 PT IS AUTISTIC performed by Luanna Davey Bergsma, MD at [facility] ENDOSCOPY    EAR SURGERY Bilateral     as a baby    LAPAROSCOPIC APPENDECTOMY N/A 10/05/2020    APPENDECTOMY LAPAROSCOPIC performed by Adalberto Ill, MD at [facility] MAIN OR    UPPER GASTROINTESTINAL ENDOSCOPY      UPPER GASTROINTESTINAL ENDOSCOPY N/A 01/08/2023     ESOPHAGOGASTRODUODENOSCOPY-Gastro Jejunal TUBE INSERTION performed by Archie Patten, MD at [facility] ENDOSCOPY        Social History     Socioeconomic History    Marital status: Single   Tobacco Use    Smoking status: Never    Smokeless tobacco: Never   Substance and Sexual Activity    Alcohol use: Not Currently    Drug use: Never     Social Determinants of Health     Financial Resource Strain: Low Risk  (11/13/2022)    Received from [facility], [facility]    Financial Resource Strain     Difficulty Paying Living Expenses: Not hard at all     Difficulty Paying Medical Expenses: No   Food Insecurity: No Food Insecurity (02/04/2023)    Hunger Vital Sign     Worried About Running Out of Food in the Last Year: Never true     Ran Out of Food in the Last Year: Never true   Transportation Needs: No Transportation Needs (02/04/2023)    PRAPARE - Transportation     Lack of Transportation (Medical): No     Lack of Transportation (Non-Medical): No   Physical Activity: Inactive (11/13/2022)    Received from [facility], [facility]    Physical Activity     Days of Exercise per Week: 0     Minutes of Exercise per Session: 0     Total Minutes of Exercise per Week: 0   Stress: Stress Concern Present (11/13/2022)    Received from [facility], [facility]    Stress     Feeling of Stress : To some extent    Social Connections [facility])   Intimate Partner Violence: Not At Risk (11/13/2022)    Received from [facility], [facility]    Intimate Partner Violence     Fear of Current or Ex-Partner: No     Emotionally Abused: No     Physically Abused: No     Sexually Abused: No   Housing Stability: Low Risk  (02/04/2023)    Housing Stability Vital Sign     Unable to Pay for Housing in the Last Year: No     Number of Times Moved in the Last Year: 1     Homeless in the Last Year: No        Previous Medications    BISACODYL (DULCOLAX) 10 MG SUPPOSITORY    Place 1 suppository rectally daily    BUPROPION (WELLBUTRIN XL) 150 MG EXTENDED RELEASE  TABLET    Take 1 tablet by mouth every morning    BUSPIRONE (BUSPAR) 10 MG TABLET    Take 1 tablet by mouth 2 times daily    CHOLECALCIFEROL (VITAMIN D3) 125 MCG (5000 UT) TABS    Take 125 tablets by mouth daily    EPINEPHRINE 0.3 MG/0.3ML SOSY    Inject 0.3 mLs into the muscle 1 (one) time if needed (allergic reaction)    GUANFACINE (INTUNIV) 4 MG TB24 EXTENDED RELEASE TABLET    Take 1 tablet by mouth daily    LEVONORGESTREL (MIRENA,  52 MG,) IUD 52 MG    1 each by IntraUTERine route once    LINACLOTIDE (LINZESS) 290 MCG CAPS CAPSULE    Take 1 capsule by mouth every morning (before breakfast)    NUTRITIONAL SUPPLEMENTS (COMPLEAT ORIGINAL PLANT-BASED) LIQD    by Enteral route    OMALIZUMAB (XOLAIR) 300 MG/2  ML SOSY INJECTION    Inject 600 mg into the skin every 28 days    ONDANSETRON (ZOFRAN) 4 MG TABLET    Take 1 tablet by mouth every 8 hours as needed for Nausea or Vomiting    PROCHLORPERAZINE (COMPAZINE) 10 MG TABLET    Take 1 tablet by mouth every 6 hours as needed    PROMETHAZINE (PHENERGAN) 25 MG TABLET    Take 1 tablet by mouth every 6 hours as needed for Nausea    PRUCALOPRIDE SUCCINATE (MOTEGRITY) 2 MG TABS    Take 1 tablet by mouth daily    QUETIAPINE (SEROQUEL XR) 200 MG EXTENDED RELEASE TABLET    Take 1 tablet by mouth nightly Patient taking 100 mg    TENAPANOR HCL (IBSRELA) 50 MG TABS    Take 50 tablets by mouth 2 times daily    TRAZODONE (DESYREL) 100 MG TABLET    Take 1 tablet by mouth nightly        Results for orders placed or performed during the hospital encounter of 03/13/23   XR ABDOMEN (KUB) (SINGLE AP VIEW)    Narrative    KUB    INDICATION:   Check G-Tube      COMPARISON:  None    TECHNIQUE: Supine views of the abdomen were obtained.    FINDINGS:  Contrast is injected in the G-tube.  This lies in the  Duodenum/Jejunum      Impression    As above    Electronically signed by Awais Siddique         XR ABDOMEN (KUB) (SINGLE AP VIEW)   Final Result   As above      Electronically signed by Salvatore Marvel                   No results for input(s): "COVID19" in the last 72 hours.    Voice dictation software was used during the making of this note.  This software is not perfect and grammatical and other typographical errors may be present.  This note has not been completely proofread for errors.       Alric Quan, APRN - NP  03/13/23 [phone]

## 2023-03-14 ENCOUNTER — Inpatient Hospital Stay
Admit: 2023-03-14 | Discharge: 2023-03-14 | Disposition: A | Discharge status: Home / Self Care | Payer: PRIVATE HEALTH INSURANCE | Attending: Emergency Medicine

## 2023-03-14 ENCOUNTER — Emergency Department: Admit: 2023-03-14 | Payer: PRIVATE HEALTH INSURANCE

## 2023-03-14 DIAGNOSIS — R1084 Generalized abdominal pain: Secondary | ICD-10-CM

## 2023-03-14 LAB — CBC WITH AUTO DIFFERENTIAL
Basophils %: 1 % (ref 0.0–2.0)
Basophils Absolute: 0.1 10*3/uL (ref 0.0–0.2)
Eosinophils %: 6 % (ref 0.5–7.8)
Eosinophils Absolute: 0.4 10*3/uL (ref 0.0–0.8)
Hematocrit: 36.5 % (ref 35.8–46.3)
Hemoglobin: 11.8 g/dL (ref 11.7–15.4)
Immature Granulocytes %: 1 % (ref 0.0–5.0)
Immature Granulocytes Absolute: 0.1 10*3/uL (ref 0.0–0.5)
Lymphocytes %: 32 % (ref 13–44)
Lymphocytes Absolute: 2.1 10*3/uL (ref 0.5–4.6)
MCH: 28 pg (ref 26.1–32.9)
MCHC: 32.3 g/dL (ref 31.4–35.0)
MCV: 86.5 fL (ref 82–102)
MPV: 9.6 fL (ref 9.4–12.3)
Monocytes %: 9 % (ref 4.0–12.0)
Monocytes Absolute: 0.6 10*3/uL (ref 0.1–1.3)
Neutrophils %: 51 % (ref 43–78)
Neutrophils Absolute: 3.5 10*3/uL (ref 1.7–8.2)
Platelets: 296 10*3/uL (ref 150–450)
RBC: 4.22 M/uL (ref 4.05–5.2)
RDW: 13.1 % (ref 11.9–14.6)
WBC: 6.7 10*3/uL (ref 4.3–11.1)
nRBC: 0 10*3/uL (ref 0.0–0.2)

## 2023-03-14 LAB — POCT URINALYSIS DIPSTICK
Bilirubin, Urine, POC: NEGATIVE
Blood, UA POC: NEGATIVE
Glucose, UA POC: NEGATIVE mg/dL
Ketones, Urine, POC: NEGATIVE mg/dL
Leukocyte Est, UA POC: NEGATIVE
Nitrite, Urine, POC: NEGATIVE
Protein, Urine, POC: NEGATIVE mg/dL
Specific Gravity, Urine, POC: 1.02 (ref 1.001–1.023)
URINE UROBILINOGEN POC: 0.2 U/dL (ref 0.2–1.0)
pH, Urine, POC: 5.5 (ref 5.0–9.0)

## 2023-03-14 LAB — COMPREHENSIVE METABOLIC PANEL
ALT: 12 U/L (ref 8–45)
AST: 23 U/L (ref 15–37)
Albumin/Globulin Ratio: 1 (ref 1.0–1.9)
Albumin: 3.3 g/dL — ABNORMAL LOW (ref 3.5–5.0)
Alk Phosphatase: 104 U/L (ref 35–104)
Anion Gap: 12 mmol/L (ref 7–16)
BUN: 6 mg/dL (ref 6–23)
CO2: 25 mmol/L (ref 20–29)
Calcium: 8.9 mg/dL (ref 8.8–10.2)
Chloride: 102 mmol/L (ref 98–107)
Creatinine: 0.78 mg/dL (ref 0.60–1.10)
Est, Glom Filt Rate: >90 mL/min/{1.73_m2} (ref >60)
Globulin: 3.4 g/dL (ref 2.3–3.5)
Glucose: 92 mg/dL (ref 70–99)
Potassium: 3.5 mmol/L (ref 3.5–5.1)
Sodium: 140 mmol/L (ref 136–145)
Total Bilirubin: <0.2 mg/dL (ref 0.0–1.2)
Total Protein: 6.8 g/dL (ref 6.3–8.2)

## 2023-03-14 LAB — LIPASE: Lipase: 25 U/L (ref 13–60)

## 2023-03-14 LAB — POC PREGNANCY UR-QUAL: Preg Test, Ur: NEGATIVE

## 2023-03-14 MED ORDER — MORPHINE SULFATE (PF) 4 MG/ML IJ SOLN
4 | INTRAMUSCULAR | Status: DC
Start: 2023-03-14 — End: 2023-03-14

## 2023-03-14 MED ORDER — KETOROLAC TROMETHAMINE 15 MG/ML IJ SOLN
15 | Freq: Once | INTRAMUSCULAR | Status: AC
Start: 2023-03-14 — End: 2023-03-14
  Administered 2023-03-14: 11:00:00 15 mg via INTRAVENOUS

## 2023-03-14 MED ORDER — IOPAMIDOL 76 % IV SOLN
76 | Freq: Once | INTRAVENOUS | Status: AC | PRN
Start: 2023-03-14 — End: 2023-03-14
  Administered 2023-03-14: 12:00:00 100 mL via INTRAVENOUS

## 2023-03-14 MED FILL — KETOROLAC TROMETHAMINE 15 MG/ML IJ SOLN: 15 MG/ML | INTRAMUSCULAR | Qty: 1

## 2023-03-14 NOTE — Discharge Instructions (Signed)
Your CT abdomen and pelvis showed no acute findings.  Please follow-up with your GI doctor.

## 2023-03-14 NOTE — Telephone Encounter (Signed)
Location of patient: [place]    Subjective: Caller states "Has her peg tube replaced last week. Was having significant pain earlier today, so we took her to the [facility] ER and the radiology department that placed tube said she's fine. Her pain has since worsened now, so we feel like it's more related to her gastroparesis. We are going to take her to her primary GI doctor at [facility]. It's been approved previously by our insurance that we can go to [facility] to see them."     Current Symptoms: severe     Onset: overnight     Associated Symptoms: N/A    Pain Severity: severe pain, sharp, constant     Temperature: denies     What has been tried: N/A    LMP:  unknown  Pregnant: No    Recommended disposition: Go to ED Now    Care advice provided, patient verbalizes understanding; denies any other questions or concerns; instructed to call back for any new or worsening symptoms.    Patient/caller agrees to proceed to [facility] Emergency Department    Attention Provider:  Thank you for allowing me to participate in the care of your patient.  The patient was connected to triage in response to symptoms provided.   Please do not respond through this encounter as the response is not directed to a shared pool.    Reason for Disposition   [1] SEVERE pain (e.g., excruciating) AND [2] present > 1 hour    Protocols used: Abdominal Pain - Female-ADULT-AH

## 2023-03-14 NOTE — ED Notes (Signed)
Patient mobility status  with no difficulty. Provider aware     I have reviewed discharge instructions with the patient.  The patient verbalized understanding.    Patient left ED via Discharge Method: ambulatory to Home with Friend.    Opportunity for questions and clarification provided.     Patient given 0 scripts.           Geraldine Contras, RN  03/14/23 [phone]

## 2023-03-14 NOTE — ED Provider Notes (Signed)
Emergency Department Provider Note       PCP: Azucena Freed., MD   Age: 22 y.o.   Sex: female     DISPOSITION Decision To Discharge 03/14/2023 08:41:21 AM    ICD-10-CM    1. Generalized abdominal pain  R10.84           Medical Decision Making     Patient is a 22 year old female presenting with pain around the feeding tube that was placed approximate 1 week ago.  Patient was seen here yesterday and x-rays were unremarkable and showed good placement of the tube.  Patient states she is having continued pain.  Patient does have a history of several GI issues including chronic nausea vomiting, GERD, prior diagnosis of gastroparesis, severe chronic idiopathic constipation and chronic malnutrition.      Abdominal Pain  Pain location:  Generalized  Pain quality: aching and dull    Pain radiates to:  Does not radiate  Pain severity:  Moderate  Onset quality:  Gradual  Duration:  1 week  Timing:  Intermittent  Progression:  Waxing and waning  Chronicity:  New  Context: not alcohol use, not medication withdrawal, not retching, not sick contacts and not suspicious food intake    Associated symptoms: no anorexia, no chills, no constipation, no dysuria, no fatigue, no fever, no flatus, no hematemesis, no hematochezia and no hematuria      Differential diagnosis includes but is not limited to abdominal pain, bowel obstruction, bowel perforation    Patient's physical exam is remarkable for mild abdominal pain but no evidence of feeding tube site infection.  No cellulitis no redness no abscess.    Patient CT abdomen pelvis no acute findings.  Laboratory testing is unremarkable and vital signs are stable.  Pain may be related to the procedure itself which should resolve.  No evidence of bowel obstruction we will DC and have patient follow-up with her GI doctor.  All questions answered.     1 or more acute illnesses that pose a threat to life or bodily function.   Shared medical decision making was utilized in  creating the patients health plan today.  I independently ordered and reviewed each unique test.           I interpreted the CT Scan AP NAD.              History     Patient is a 22 year old female presenting with pain around the feeding tube that was placed approximate 1 week ago.  Patient was seen here yesterday and x-rays were unremarkable and showed good placement of the tube.  Patient states she is having continued pain.  Patient does have a history of several GI issues including chronic nausea vomiting, GERD, prior diagnosis of gastroparesis, severe chronic idiopathic constipation and chronic malnutrition.      Abdominal Pain  Pain location:  Generalized  Pain quality: aching and dull    Pain radiates to:  Does not radiate  Pain severity:  Moderate  Onset quality:  Gradual  Duration:  1 week  Timing:  Intermittent  Progression:  Waxing and waning  Chronicity:  New  Context: not alcohol use, not medication withdrawal, not retching, not sick contacts and not suspicious food intake    Associated symptoms: no anorexia, no chills, no constipation, no dysuria, no fatigue, no fever, no flatus, no hematemesis, no hematochezia and no hematuria        ROS     Review of Systems  Constitutional:  Negative for chills, fatigue and fever.   Gastrointestinal:  Positive for abdominal pain. Negative for anorexia, constipation, flatus, hematemesis and hematochezia.   Genitourinary:  Negative for dysuria and hematuria.   All other systems reviewed and are negative.       Physical Exam     Vitals signs and nursing note reviewed:  Vitals:    03/14/23 0642 03/14/23 0730 03/14/23 0735   BP: 115/74 106/67    Pulse: (!) 113 93    Resp: 19 15    Temp: 98.5 F (36.9 C)     TempSrc: Oral     SpO2: 97% 96% 96%   Weight: 56.7 kg (125 lb)     Height: 1.651 m (5\' 5" )        Physical Exam  Vitals and nursing note reviewed.   Constitutional:       Appearance: Normal appearance.   HENT:      Head: Normocephalic and atraumatic.      Nose: Nose  normal.      Mouth/Throat:      Mouth: Mucous membranes are moist.      Pharynx: Oropharynx is clear.   Eyes:      Extraocular Movements: Extraocular movements intact.      Conjunctiva/sclera: Conjunctivae normal.      Pupils: Pupils are equal, round, and reactive to light.   Cardiovascular:      Rate and Rhythm: Normal rate.      Pulses: Normal pulses.   Pulmonary:      Effort: Pulmonary effort is normal.   Abdominal:      General: Abdomen is flat. Bowel sounds are normal.      Palpations: Abdomen is soft.      Tenderness: There is abdominal tenderness.      Comments: Feeding tube site clean dry and intact with no redness.   Musculoskeletal:         General: Normal range of motion.      Cervical back: Normal range of motion and neck supple.   Skin:     General: Skin is warm.      Capillary Refill: Capillary refill takes less than 2 seconds.   Neurological:      General: No focal deficit present.      Mental Status: She is alert and oriented to person, place, and time. Mental status is at baseline.   Psychiatric:         Mood and Affect: Mood normal.         Behavior: Behavior normal.         Thought Content: Thought content normal.         Judgment: Judgment normal.        Procedures     Procedures    Orders Placed This Encounter   Procedures    CT ABDOMEN PELVIS W IV CONTRAST Additional Contrast? None    CBC with Auto Differential    CMP    Lipase    POCT Urine Dipstick    POC Pregnancy Urine Qual    POCT Urinalysis no Micro    POC Pregnancy Urine Qual        Medications given during this emergency department visit:  Medications   iopamidol (ISOVUE-370) 76 % injection 100 mL (100 mLs IntraVENous Given 03/14/23 0751)   ketorolac (TORADOL) injection 15 mg (15 mg IntraVENous Given 03/14/23 0728)       New Prescriptions    No medications on file  Past Medical History:   Diagnosis Date    ADHD     managed with meds    Anxiety     Autism     Chronic constipation     GERD (gastroesophageal reflux disease)     Weight  loss         Past Surgical History:   Procedure Laterality Date    CHOLECYSTECTOMY, LAPAROSCOPIC N/A 10/05/2020    CHOLECYSTECTOMY LAPAROSCOPIC performed by Adalberto Ill, MD at [facility] MAIN OR    COLONOSCOPY N/A 09/17/2020    COLONOSCOPY/BMI 21 performed by Luanna Cole, MD at [facility] ENDOSCOPY    COLONOSCOPY N/A 01/14/2019    COLONOSCOPY/  BMI 21 PT IS AUTISTIC performed by Luanna Cole, MD at [facility] ENDOSCOPY    EAR SURGERY Bilateral     as a baby    LAPAROSCOPIC APPENDECTOMY N/A 10/05/2020    APPENDECTOMY LAPAROSCOPIC performed by Adalberto Ill, MD at [facility] MAIN OR    UPPER GASTROINTESTINAL ENDOSCOPY      UPPER GASTROINTESTINAL ENDOSCOPY N/A 01/08/2023    ESOPHAGOGASTRODUODENOSCOPY-Gastro Jejunal TUBE INSERTION performed by Archie Patten, MD at [facility] ENDOSCOPY        Social History     Socioeconomic History    Marital status: Single   Tobacco Use    Smoking status: Never    Smokeless tobacco: Never   Substance and Sexual Activity    Alcohol use: Not Currently    Drug use: Never     Social Determinants of Health     Financial Resource Strain: Low Risk  (11/13/2022)    Received from [facility], [facility]    Financial Resource Strain     Difficulty Paying Living Expenses: Not hard at all     Difficulty Paying Medical Expenses: No   Food Insecurity: No Food Insecurity (02/04/2023)    Hunger Vital Sign     Worried About Running Out of Food in the Last Year: Never true     Ran Out of Food in the Last Year: Never true   Transportation Needs: No Transportation Needs (02/04/2023)    PRAPARE - Transportation     Lack of Transportation (Medical): No     Lack of Transportation (Non-Medical): No   Physical Activity: Inactive (11/13/2022)    Received from [facility], [facility]    Physical Activity     Days of Exercise per Week: 0     Minutes of Exercise per Session: 0     Total Minutes of Exercise per Week: 0   Stress: Stress Concern Present (11/13/2022)    Received from [facility], [facility]    Stress      Feeling of Stress : To some extent    Social Connections [facility])   Intimate Partner Violence: Not At Risk (11/13/2022)    Received from [facility], [facility]    Intimate Partner Violence     Fear of Current or Ex-Partner: No     Emotionally Abused: No     Physically Abused: No     Sexually Abused: No   Housing Stability: Low Risk  (02/04/2023)    Housing Stability Vital Sign     Unable to Pay for Housing in the Last Year: No     Number of Times Moved in the Last Year: 1     Homeless in the Last Year: No        Previous Medications    BISACODYL (DULCOLAX) 10 MG SUPPOSITORY  Place 1 suppository rectally daily    BUPROPION (WELLBUTRIN XL) 150 MG EXTENDED RELEASE TABLET    Take 1 tablet by mouth every morning    BUSPIRONE (BUSPAR) 10 MG TABLET    Take 1 tablet by mouth 2 times daily    CHOLECALCIFEROL (VITAMIN D3) 125 MCG (5000 UT) TABS    Take 125 tablets by mouth daily    EPINEPHRINE 0.3 MG/0.3ML SOSY    Inject 0.3 mLs into the muscle 1 (one) time if needed (allergic reaction)    GUANFACINE (INTUNIV) 4 MG TB24 EXTENDED RELEASE TABLET    Take 1 tablet by mouth daily    LEVONORGESTREL (MIRENA, 52 MG,) IUD 52 MG    1 each by IntraUTERine route once    LINACLOTIDE (LINZESS) 290 MCG CAPS CAPSULE    Take 1 capsule by mouth every morning (before breakfast)    NUTRITIONAL SUPPLEMENTS (COMPLEAT ORIGINAL PLANT-BASED) LIQD    by Enteral route    OMALIZUMAB (XOLAIR) 300 MG/2  ML SOSY INJECTION    Inject 600 mg into the skin every 28 days    ONDANSETRON (ZOFRAN) 4 MG TABLET    Take 1 tablet by mouth every 8 hours as needed for Nausea or Vomiting    PROCHLORPERAZINE (COMPAZINE) 10 MG TABLET    Take 1 tablet by mouth every 6 hours as needed    PROMETHAZINE (PHENERGAN) 25 MG TABLET    Take 1 tablet by mouth every 6 hours as needed for Nausea    PRUCALOPRIDE SUCCINATE (MOTEGRITY) 2 MG TABS    Take 1 tablet by mouth daily    QUETIAPINE (SEROQUEL XR) 200 MG EXTENDED RELEASE TABLET    Take 1 tablet by mouth nightly Patient  taking 100 mg    TENAPANOR HCL (IBSRELA) 50 MG TABS    Take 50 tablets by mouth 2 times daily    TRAZODONE (DESYREL) 100 MG TABLET    Take 1 tablet by mouth nightly        Results for orders placed or performed during the hospital encounter of 03/14/23   CT ABDOMEN PELVIS W IV CONTRAST Additional Contrast? None    Narrative    CT OF THE ABDOMEN AND PELVIS    INDICATION: Abdominal pain, status post PEG tube placement 03/06/2023    TECHNIQUE: Multiple 2D axial images were obtained through the abdomen and  pelvis.  Oral contrast was used for bowel opacification.  of Isovue 370  intravenous contrast was used for better evaluation of solid organs and vascular  structures.  Radiation dose reduction techniques were used for this study.  All  CT scans performed at this facility use one or all of the following: Automated  exposure control, adjustment of the mA and/or kVp according to patient's size,  iterative reconstruction.    COMPARISON: February 05, 2023    FINDINGS:  - LUNG BASES: No infiltrates or masses. Again small bilateral pleural effusions.    - LIVER: Normal in size and appearance.    - GALLBLADDER/BILE DUCTS: No gallstones or bile duct dilatation.  - PANCREAS: Normal.  - SPLEEN: Normal.    - ADRENALS: Normal.  - KIDNEYS/URETERS: No hydronephrosis or significant mass.  - BLADDER: Normal.  - REPRODUCTIVE ORGANS: Intrauterine device is noted.    - BOWEL: Normal caliber.  No inflammatory changes. A PEG-J tube is again  identified.  - LYMPH NODES: No significant retroperitoneal, mesenteric, or pelvic adenopathy.  - BONES: No fracture or significant bone lesion.  - VASCULATURE: Normal  -  OTHER: No ascites.      Impression    1. Small bilateral pleural effusions as noted on the previous examination. No  acute inflammatory process.      If providers have any questions about this report, I can be reached on  Perfect[employer].      Electronically signed by Salvatore Marvel   CBC with Auto Differential   Result Value Ref  Range    WBC 6.7 4.3 - 11.1 K/uL    RBC 4.22 4.05 - 5.2 M/uL    Hemoglobin 11.8 11.7 - 15.4 g/dL    Hematocrit 52.8 41.3 - 46.3 %    MCV 86.5 82 - 102 FL    MCH 28.0 26.1 - 32.9 PG    MCHC 32.3 31.4 - 35.0 g/dL    RDW 24.4 01.0 - 27.2 %    Platelets 296 150 - 450 K/uL    MPV 9.6 9.4 - 12.3 FL    nRBC 0.00 0.0 - 0.2 K/uL    Differential Type AUTOMATED      Neutrophils % 51 43 - 78 %    Lymphocytes % 32 13 - 44 %    Monocytes % 9 4.0 - 12.0 %    Eosinophils % 6 0.5 - 7.8 %    Basophils % 1 0.0 - 2.0 %    Immature Granulocytes % 1 0.0 - 5.0 %    Neutrophils Absolute 3.5 1.7 - 8.2 K/UL    Lymphocytes Absolute 2.1 0.5 - 4.6 K/UL    Monocytes Absolute 0.6 0.1 - 1.3 K/UL    Eosinophils Absolute 0.4 0.0 - 0.8 K/UL    Basophils Absolute 0.1 0.0 - 0.2 K/UL    Immature Granulocytes Absolute 0.1 0.0 - 0.5 K/UL   CMP   Result Value Ref Range    Sodium 140 136 - 145 mmol/L    Potassium 3.5 3.5 - 5.1 mmol/L    Chloride 102 98 - 107 mmol/L    CO2 25 20 - 29 mmol/L    Anion Gap 12 7 - 16 mmol/L    Glucose 92 70 - 99 mg/dL    BUN 6 6 - 23 MG/DL    Creatinine 5.36 6.44 - 1.10 MG/DL    Est, Glom Filt Rate >90 >60 ml/min/1.76m2    Calcium 8.9 8.8 - 10.2 MG/DL    Total Bilirubin <0.3 0.0 - 1.2 MG/DL    ALT 12 8 - 45 U/L    AST 23 15 - 37 U/L    Alk Phosphatase 104 35 - 104 U/L    Total Protein 6.8 6.3 - 8.2 g/dL    Albumin 3.3 (L) 3.5 - 5.0 g/dL    Globulin 3.4 2.3 - 3.5 g/dL    Albumin/Globulin Ratio 1.0 1.0 - 1.9     Lipase   Result Value Ref Range    Lipase 25 13 - 60 U/L   POCT Urinalysis no Micro   Result Value Ref Range    Specific Gravity, Urine, POC 1.020 1.001 - 1.023      pH, Urine, POC 5.5 5.0 - 9.0      Protein, Urine, POC Negative NEG mg/dL    Glucose, UA POC Negative NEG mg/dL    Ketones, Urine, POC Negative NEG mg/dL    Bilirubin, Urine, POC Negative NEG      Blood, UA POC Negative NEG      URINE UROBILINOGEN POC 0.2 0.2 - 1.0 EU/dL    Nitrite, Urine, POC Negative NEG  Leukocyte Est, UA POC Negative NEG      Performed  by: Geraldine Contras    POC Pregnancy Urine Qual   Result Value Ref Range    Preg Test, Ur Negative NEG           CT ABDOMEN PELVIS W IV CONTRAST Additional Contrast? None   Final Result   1. Small bilateral pleural effusions as noted on the previous examination. No   acute inflammatory process.         If providers have any questions about this report, I can be reached on   [employer].         Electronically signed by Salvatore Marvel                   No results for input(s): "COVID19" in the last 72 hours.    Voice dictation software was used during the making of this note.  This software is not perfect and grammatical and other typographical errors may be present.  This note has not been completely proofread for errors.       [address] [place], [place]  03/14/23 [phone]

## 2023-03-14 NOTE — ED Triage Notes (Signed)
C/o abd pain and nausea, had new feeding tube placed last week, was seen here on 03/13/23 for same s/s but now is having worse Abd pain. States she generally is just not feeling well

## 2023-03-16 NOTE — Care Coordination-Inpatient (Signed)
Ambulatory Care Coordination Note     03/16/2023 3:57 PM     Patient Current Location:  Home: [address]  [place] [address]     ACM contacted the parent by telephone. Verified name and DOB with parent as identifiers.         ACM: Dyann Kief, RN     Challenges to be reviewed by the provider   Additional needs identified to be addressed with provider No  none               Method of communication with provider: none.    Has the patient been seen in the ED since your last call? yes    Care Summary Note: Telephonic outreach to the patient's mother, Carollee Herter to follow up post ED visit. Mom reports that the patient had her PEG tube replaced earlier in the week, due to leakage. The patient returned to the ED complaining of severe pain at the site. Patient was given Toradol for pain. Pain as subsided, but is still present.   Patient completed testing in [place] today, expecting the MRI results in the next 24-48 hours. The tests will help determine the best course of treatment for the patient. Will follow the patient and continue outreach to mom.     Offered patient enrollment in the Remote Patient Monitoring (RPM) program for in-home monitoring: Patient is not eligible for RPM program because: insurance coverage.     Assessments Completed:   No changes since last call    Medications Reviewed:   Completed during a previous call     Advance Care Planning:   Reviewed and current     Care Planning:    Goals Addressed                   This Visit's Progress     Attends follow up appointments on schedule   On track     Patient will adhere to scheduled appointments and medications.        Self Monitoring   On track     Daily Weights - I will weight myself as directed - Daily and write down weights    Patient Reported Weight        No data to display                Barriers: overwhelmed by complexity of regimen and stress  Plan for overcoming my barriers: therapy and family support  Confidence: 5/10  Anticipated Goal  Completion Date: 04/12/2023                 PCP/Specialist follow up:   Future Appointments         Provider Specialty Dept Phone    06/22/2023 8:00 AM (Arrive by 7:00 AM) [facility]; [facility] Radiology [phone]            Follow Up:   Plan for next ACM outreach in approximately 1 week to complete:  - education   - follow up regarding test results.  .   Caregiver is agreeable to this plan.

## 2023-03-18 MED FILL — XOLAIR 300MG/2ML SOSY: 300 300 MG/2ML | SUBCUTANEOUS | 28 days supply | Qty: 4 | Fill #1 | Status: AC

## 2023-03-24 MED FILL — XOLAIR 300MG/2ML SOSY: 300 300 MG/2ML | SUBCUTANEOUS | 28 days supply | Qty: 4 | Fill #2 | Status: AC

## 2023-03-31 NOTE — Care Coordination-Inpatient (Signed)
 Ambulatory Care Coordination Note     03/31/2023 1:38 PM     Parent outreach attempt by this ACM today to perform care management follow up . Patient's mother, Carollee Herter was on a flight from [place] and was unable to talk at this time.       ACM: Demetria Pore

## 2023-04-21 MED FILL — XOLAIR 300MG/2ML SOSY: 300 300 MG/2ML | SUBCUTANEOUS | 28 days supply | Qty: 4 | Fill #3 | Status: AC

## 2023-04-24 MED FILL — LINZESS 290MCG CAPS: 290 290 MCG | 90 days supply | Qty: 90 | Fill #0 | Status: AC

## 2023-04-24 MED FILL — BUPROPION HCL ER (XL) 150MG TB24: 150 150 MG | 90 days supply | Qty: 90 | Fill #0 | Status: AC

## 2023-04-24 MED FILL — TRAZODONE HCL 100MG TABS: 100 100 MG | 90 days supply | Qty: 90 | Fill #0 | Status: AC

## 2023-04-24 NOTE — Care Coordination-Inpatient (Signed)
 Ambulatory Care Coordination Note     04/24/2023 12:46 PM     Patient Current Location:  Home: [address]  [place] [address]     ACM contacted the parent by telephone. Verified name and DOB with parent as identifiers.         ACM: Dyann Kief,

## 2023-05-18 NOTE — Care Coordination (Signed)
 Ambulatory Care Coordination Note     05/18/2023 2:57 PM     Patient Current Location:  [place]      Parent contacted the ACM by telephone. Verified name and DOB with parent as identifiers.         ACM: [name], RN     Challenges to be reviewed by the provider   Additional needs identified to be addressed with provider No  none               Method of communication with provider: none.    Utilization: Patient has not had any utilization since our last call.     Care Summary Note: Incoming call and return call to the patient's mother, Clotilda. Mom was calling to discuss concerns that had been noted in the last conversation. Informed Clotilda that this ACM had recently sent a reminder email to obtain the information and that this ACM will reach out when information is obtained regarding a medication and claims that the patient has received.     Offered patient enrollment in the Remote Patient Monitoring (RPM) program for in-home monitoring: Patient is not eligible for RPM program because: insurance coverage.     Assessments Completed:   No changes since last call    Medications Reviewed:   Completed during a previous call     Advance Care Planning:   Not reviewed during this call     Care Planning:   Not completed during this call    PCP/Specialist follow up:   Future Appointments         Provider Specialty Dept Phone    06/22/2023 8:00 AM (Arrive by 7:00 AM) [facility]; [facility] Radiology [phone]            Follow Up:   Plan for next ACM outreach in approximately 1-2 days  to complete:  - goal progression  - education .   Caregiver is agreeable to this plan.

## 2023-05-19 MED FILL — XOLAIR 300MG/2ML SOSY: 300 300 MG/2ML | SUBCUTANEOUS | 28 days supply | Qty: 4 | Fill #4 | Status: AC

## 2023-05-21 NOTE — Care Coordination-Inpatient (Signed)
Ambulatory Care Coordination Note     05/21/2023 3:10 PM     Patient Current Location:  [facility]     ACM contacted the parent by telephone. Verified name and DOB with parent as identifiers.         ACM: Dyann Kief, RN     Challenges to be reviewed by the provider   Additional needs identified to be addressed with provider No  none               Method of communication with provider: none.    Utilization: Patient has not had any utilization since our last call.     Care Summary Note: Telephonic outreach to the patient to get an update from regarding the medication Isbrela and authorization for it. This ACM received a response from pharmacy regarding the appeal and status of the [place]. Apparently there was some confusion with the year change. The patient insurance is updated in 2025 with a new ID number. The appeal that was sent from Laurette Schimke has the patient's old ID number and the 2024 denial date. When it was sent in, it was likely not linked to her current plan, so technically the appeal was not received.   This ACM attempted a second outreach to mom to obtain the new ID number, so that the original appeal can be resubmitted under the new number. Will continue working with mom to get the appeal resubmitted.     Offered patient enrollment in the Remote Patient Monitoring (RPM) program for in-home monitoring: Patient is not eligible for RPM program because: insurance coverage.     Assessments Completed:   No changes since last call    Medications Reviewed:   Completed during a previous call     Advance Care Planning:   Not reviewed during this call     Care Planning:   Not completed during this call    PCP/Specialist follow up:   Future Appointments         Provider Specialty Dept Phone    06/22/2023 8:00 AM (Arrive by 7:00 AM) [facility]; [facility] Radiology [phone]            Follow Up:   Plan for next ACM outreach in approximately 1-2 days  to complete:  - follow up appointment with  mom regarding medication appeal .   Caregiver is agreeable to this plan.

## 2023-05-22 NOTE — Care Coordination-Inpatient (Signed)
Ambulatory Care Coordination Note     05/22/2023 2:49 PM     Email sent to mom to discuss medication appeal letter and resubmission for coverage of the Isbrela. Will continue outreach attempts.

## 2023-05-25 NOTE — Care Coordination-Inpatient (Signed)
Ambulatory Care Coordination Note     05/25/2023 2:09 PM     Patient Current Location:  [facility]     ACM contacted the parent by telephone. Verified name and DOB with parent as identifiers.         ACM: Dyann Kief, RN     Challenges to be reviewed by the provider   Additional needs identified to be addressed with provider No  none               Method of communication with provider: none.    Utilization: Patient has not had any utilization since our last call.     Care Summary Note: Telephonic outreach to the patient's mother, Carollee Herter. Contacted mom to inform her that the Isbrela medication appeal has been resubmitted. Answered questions at this time. Mom reports that her progress has been steady, she is primarily using her port for calorie intake. Mom reiterated how she feels the Nilda Calamity is likely responsible for this improvement.   Also discussed concerns mom has regarding being billed for ambulance transfer claims. Informed mom that upon researching the claims, it does look like 85% of the ambulance transfer was paid out. Mom should owe $207.00. She did state that there is another transfer that needs to be looked into from [place]. Mom will get back to this ACM with dates.   Will continue to follow and assess needs.     Offered patient enrollment in the Remote Patient Monitoring (RPM) program for in-home monitoring: Patient is not eligible for RPM program because: insurance coverage.     Assessments Completed:   No changes since last call    Medications Reviewed:   Patient denies any changes with medications and reports taking all medications as prescribed.    Advance Care Planning:   Not reviewed during this call     Care Planning:    Goals Addressed                   This Visit's Progress     Attends follow up appointments on schedule   On track     Patient will adhere to scheduled appointments and medications.        Self Monitoring   On track     Daily Weights - I will weight myself as directed - Daily  and write down weights    Patient Reported Weight        No data to display                Barriers: overwhelmed by complexity of regimen and stress  Plan for overcoming my barriers: therapy and family support  Confidence: 5/10  Anticipated Goal Completion Date: 04/12/2023                 PCP/Specialist follow up:   Future Appointments         Provider Specialty Dept Phone    06/22/2023 8:00 AM (Arrive by 7:00 AM) [facility]; [facility] Radiology [phone]            Follow Up:   Plan for next ACM outreach in approximately 2 weeks to complete:  - goal progression  - education .   Caregiver is agreeable to this plan.

## 2023-05-26 ENCOUNTER — Encounter

## 2023-05-26 LAB — HEPATITIS B SURFACE ANTIBODY: Hep B S Ab: <3.5 m[IU]/mL

## 2023-05-26 NOTE — Progress Notes (Signed)
[facility]  [address]  [place] [address]      Date of Service: 05/26/2023  Primary Care Provider:   Azucena Freed., MD       Name: Robin Mendoza "Robin Mendoza"  DOB: 2001-04-08       Age:23 y.o.      Sex: female      MRN: [id number]       Chief Complaint:Eating Disorder ([name] states she has been off tube feeds for about 2 weeks,  Patient states she tried to buy laxatives but was caught by brother's girlfriend) and Ankle Pain (Jumped off a bed in [place] on Christmas Day, still having R ankle pain, patient states she "cannot walk on it without her shoes")     Subjective   [name] is a 23 y.o. female who presents to the [facility] office for Eating Disorder ([name] states she has been off tube feeds for about 2 weeks,  Patient states she tried to buy laxatives but was caught by brother's girlfriend) and Ankle Pain (Jumped off a bed in [place] on Christmas Day, still having R ankle pain, patient states she "cannot walk on it without her shoes")      Follow Up: Patient presents today for follow-up of multiple concerns    1.) Nausea: Patient reports that her nausea for the most part is well-controlled on her current regimen of medications.  Patient currently is taking Phenergan primarily at nighttime, but will also take it around the time of needing to administer her Suprep.  Patient has also been taking Zofran anywhere from 3-4 times a day and has only been using Compazine around the times of Suprep initiation/administration.  Patient states this regimen has been very effective for her and denies any concerns with this regimen at today's visit.    2.) Ankle pain: Patient reports that approximately 1 to 2 weeks ago while on vacation, she jumped from the top of a bunk bed and injured her right ankle.  She is not entirely sure how she landed on her right ankle, but since that time her ankle has been  painful when putting pressure on it.  Patient states that she is able to walk with a limp and is able to walk when she wear shoes, however she does feel that there might be more of an injury than a sprain.  Patient denies any obvious bruising or deformity    3.) GJ tube use/dependence: Patient states that she has not needed to use her GJ tube for nutrition for at least 3 weeks.  She has been able to maintain her nutrition by mouth over this time and is happy to hear that she has not had any significant weight changes over this time.  Patient states she is still utilizing her GJ tube for  Suprep administration and does not want to have it removed just yet.  Patient still is struggling significantly with constipation and is working through this process with GI and motility specialist.  Mom states that she is transitioning all of her providers now to [facility] and would like a referral to surgery to continue management of this GJ tube.  Of note, patient noticed some red-tinged fluid in the GJ tube today and denies administering anything that looked red down the tube.  Patient has not had a Suprep administration since Sunday.    4.) dysregulated behaviors: Since last visit, patient states that she "did  something bad."  She states that she was caught trying to buy laxatives and notes that she was "desperate to feel better."  She states she did not tell anyone about her wanting to buy laxatives as she is afraid that her coping mechanisms would be taken away from her.  Patient states that she has been talking about these urges with her psychologist but is not sure if this has been as effective as she would like.    5.) mental health: Patient is still seeing Dr. Lenord Carbo for psychological support and seeing [facility] Skeans) for medication management    [facility] Assessment <redacted file path>    Screening Tools From Visit:    Patient did not complete        Current Outpatient Medications   Medication  Instructions   . buPROPion (WELLBUTRIN XL) 150 mg, Oral, Daily   . cholecalciferol (vitamin D3) 2,000 Units, Oral, Daily   . EPINEPHrine (EPIPEN) 0.3 mg, Intramuscular, Once as needed   . guanFACINE (INTUNIV ER) 4 mg, Daily   . hydrOXYzine (ATARAX) 25 mg, Every 3 hours PRN   . Ibsrela 50 mg, 2 times daily   . levonorgestreL (MIRENA) 20 mcg/24 hours (7 yrs) 52 mg IUD 1 each, Once   . linaCLOtide (LINZESS) 290 mcg, Oral, Daily, at least 30 minutes before the first meal of the day on an empty stomach.   . Motegrity 2 mg, Daily   . omalizumab (XOLAIR) 600 mg, Every 28 days   . ondansetron (Zofran) 4 MG tablet Take 2 tablets (8mg ) at 8am, then 4mg  tablet every 2-4 hours as needed during the day (max daily dose 20mg  daily)   . prochlorperazine (COMPAZINE) 10 mg tablet Take 1 tablet with Suprep administration 1-3 times a week   . promethazine (PHENERGAN) 25 mg, Oral, 2 times daily PRN   . QUEtiapine (SEROQUEL) 100 mg, Oral, Bedtime   . sodium,potassium,mag sulfates (Suprep Bowel Prep Kit) As per colonoscopy instructions   . traZODone (DESYREL) 100 mg, Oral, Bedtime PRN       Please see Chart Record for background information, including Past Medical Hx, Allergies, Family Hx and Surgical Hx.    Review of Systems   Constitutional: Negative.    HENT: Negative.     Eyes: Negative.    Respiratory: Negative.     Cardiovascular: Negative.    Gastrointestinal:  Positive for nausea.   Endocrine: Negative.    Genitourinary: Negative.    Musculoskeletal:  Positive for arthralgias.   Skin: Negative.    Allergic/Immunologic: Negative.    Neurological: Negative.    Hematological: Negative.    Psychiatric/Behavioral:  The patient is nervous/anxious.    All other systems reviewed and are negative.         Objective     Vitals:    05/26/23 1101   BP: 110/75   BP Location: Left arm   Patient Position: Sitting   Pulse: 99   Temp: 97.5 F (36.4 C)   TempSrc: Temporal   Weight: 61.9 kg (136 lb 6.4 oz)     No LMP recorded (lmp unknown).  (Menstrual status: IUD Contraception).    Wt Readings from Last 3 Encounters:   05/26/23 61.9 kg (136 lb 6.4 oz)   04/20/23 61 kg (134 lb 6.4 oz)   03/20/23 61.8 kg (136 lb 3.2 oz)       Physical Exam  Vitals and nursing note reviewed.   Constitutional:       General: She  is not in acute distress.     Appearance: She is well-developed.   HENT:      Head: Normocephalic and atraumatic.   Eyes:      Conjunctiva/sclera: Conjunctivae normal.      Pupils: Pupils are equal, round, and reactive to light.   Cardiovascular:      Rate and Rhythm: Normal rate and regular rhythm.      Pulses: Normal pulses.      Heart sounds: Normal heart sounds. No murmur heard.     No friction rub. No gallop.   Pulmonary:      Effort: Pulmonary effort is normal. No respiratory distress.      Breath sounds: Normal breath sounds.   Musculoskeletal:         General: Normal range of motion.      Cervical back: Normal range of motion and neck supple.      Comments: No deformity of right ankle, pain noted on malleolus on both sides of right ankle.  Reflexes intact   Skin:     General: Skin is warm.   Neurological:      General: No focal deficit present.      Mental Status: She is alert and oriented to person, place, and time. Mental status is at baseline.              Assessment and Plan     Key Action Plan/Changes from Today's Visit:    Will refill patient's nausea medications today and update the prescriptions to reflect how often patient is using these throughout the day  At this time I do not recommend patient begin to use laxatives and encourage communication between patient and family members when she has these urges.  I also recommend that they open up conversation around the use of laxatives with patient's gastroenterology and motility doctors.  I also recommend patient continue with open communication with her psychologist around what has or has not been effective in conversation.  Regarding patient's ankle, I suspect that this is just a  standard ankle sprain.  However, where patient is reporting pain is somewhat unusual with the exam.  Will complete x-ray of the ankle to determine if there is any concern of avulsion or fracture  Regarding patient's GJ tube, will refer to [facility] for establishment of care and management of this tube as patient is transitioning to [employer].  However, we will also ask them to evaluate the concerns of GJ tube placement noted today.  I will order an x-ray to evaluate placement as best as I can with further imaging as a possibility.  Recommend patient flush the tube at home to assess for any abnormal mechanism.  Also recommend patient reach out to [facility] surgery to see if they have any advice related to this to.  For now, patient is maintaining without nutrition through the GJ tube.  Patient can continue to avoid nutrition through this tube for the time being and we will continue to monitor her weight maintenance.  Follow-up in 4 to 5 weeks as already scheduled.  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     Controlled Substance SCRIPTS Check (if applicable):  Not applicable      Billing Documentation:    I spent a total of 80 minutes, on the date of the visit, personally performing services related to this encounter. The total time includes face-to-face, pre and post service times.

## 2023-05-26 NOTE — Telephone Encounter (Signed)
RTC to patient.  She states describes the amount of blood as a "little", and pink in color which started today.  She says her pain is rated as a 4/10.  She has no nausea, vomiting, or other symptoms.  She has not eaten any red-colored foods recently.  There are no fevers, and her last BM was Sunday.    After discussion with Dr. Percell Miller, he recommends that they monitor the amount of bleeding in the tube.  She is instructed to take her stools softeners she has in order to facilitate a BM.  She is to report to the ER for any severe pain, uncontrolled nausea or vomiting, or if there is a drastic increase in the amount of blood she is seeing come from the tube.    Pt verbalized understanding.

## 2023-05-26 NOTE — Telephone Encounter (Signed)
Robin Mendoza from [facility] is calling to speak to a nurse about an appeal she is working on for the pt for the medication tenapanor [name]) 50 mg tab. Please call

## 2023-05-26 NOTE — Telephone Encounter (Signed)
Pt lvm; stating she is having blood in her ng tube; please call her.

## 2023-05-29 NOTE — Telephone Encounter (Signed)
I have called Patients mom, she said that she is good with the Tuesday appt with Dr Kathrynn Running but that Maggie Schwalbe has autism and is having discomfort. She would like a nurse to call her back

## 2023-05-29 NOTE — Telephone Encounter (Signed)
Called patient mother she states that there is serous sanguineus darkening fluid coming from J tube and there is discomfort  however she believes they will be fine until they are able to come in on Tuesday.     Advised that there is a triage nurse here for her to reach out to over the weekend

## 2023-05-30 LAB — QUANTIFERON IN TUBE: Quantiferon TB Gold Plus: NEGATIVE

## 2023-05-30 LAB — QUANTIFERON REFLEX
QuantiFERON Mitogen Value: >10 [IU]/mL
QuantiFERON Nil Value: 0.07 [IU]/mL
QuantiFERON TB1 Ag Value: 0.06 [IU]/mL
QuantiFERON TB2 Ag Value: 0.08 [IU]/mL

## 2023-06-02 MED FILL — VITAMIN D 50 MCG(2000 UT) TABS: 50 50 MCG (2000 UT) | ORAL | 90 days supply | Qty: 90 | Fill #0 | Status: AC

## 2023-06-02 MED FILL — PROCHLORPERAZINE MALEATE 10MG TABS: 10 10 MG | ORAL | 90 days supply | Qty: 39 | Fill #0 | Status: AC

## 2023-06-02 MED FILL — GUANFACINE HCL ER 4MG TB24: 4 4 MG | 90 days supply | Qty: 90 | Fill #0 | Status: AC

## 2023-06-02 MED FILL — PROMETHAZINE HCL 25MG TABS: 25 25 MG | 90 days supply | Qty: 180 | Fill #0 | Status: AC

## 2023-06-02 MED FILL — ONDANSETRON HCL 4MG TABS: 4 4 MG | 90 days supply | Qty: 450 | Fill #0 | Status: AC
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
## 2023-06-03 MED FILL — QUETIAPINE FUMARATE 100MG TABS: 100 100 MG | 90 days supply | Qty: 90 | Fill #0 | Status: AC

## 2023-06-03 NOTE — Progress Notes (Signed)
The majority of this encounter's documentation is noted within an EMR sensitive note, limited to mental health professionals.  Please contact me directly with any direct concerns.    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

## 2023-06-03 NOTE — Patient Instructions (Addendum)
Individual Therapy (68 minutes)  Return on 06/17/2023

## 2023-06-12 NOTE — Telephone Encounter (Signed)
Called LVM pt needs follow up with Dr. Herbert Spires possibly in March.

## 2023-06-15 MED FILL — XOLAIR 300MG/2ML SOSY: 300 300 MG/2ML | SUBCUTANEOUS | 28 days supply | Qty: 4 | Fill #5 | Status: AC

## 2023-06-17 NOTE — Patient Instructions (Signed)
Individual Therapy (46 minutes)  Return on 06/29/2023

## 2023-06-17 NOTE — Progress Notes (Signed)
The majority of this encounter's documentation is noted within an EMR sensitive note, limited to mental health professionals.  Please contact me directly with any direct concerns.    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

## 2023-06-22 ENCOUNTER — Inpatient Hospital Stay: Payer: PRIVATE HEALTH INSURANCE | Attending: Diagnostic Radiology

## 2023-06-22 NOTE — Telephone Encounter (Signed)
Called spoke to patient mom rescheduled appointment from 2/11 to 2/25 to see Dr. Ree Edman.

## 2023-06-24 ENCOUNTER — Inpatient Hospital Stay: Payer: PRIVATE HEALTH INSURANCE | Attending: Diagnostic Radiology

## 2023-06-25 NOTE — Care Coordination-Inpatient (Signed)
Ambulatory Care Coordination Note     06/25/2023 11:56 AM     Email sent to the patient's mother in response to an email that she had sent regarding claims that were unpaid by [facility] [place] [facility]. These claims are being looked at and this ACM will continue to work with mom to assist as needed.

## 2023-07-07 NOTE — Discharge Summary (Signed)
 Discharge Summary      Robin Mendoza  DOB: [birth year]  MRN: [id number]    Attending Physician: Iran Sizer, MD  PCP: Azucena Freed., MD  Service: Gastroenterology    Transition of Care   Admission Date:  07/02/2023      Discharge Date:  07/07/2023         Patient High Risk for Readmission?: Low      Code Status at the Time of Discharge: Full Code            Open Standing Orders       None          Future Labs/Studies         Expected Expires    Referral to Gastroenterology - [place] [REF25] 12/19/22 03/06/24    Referral to General Surgery - [place] [REF27]  03/13/24    Pancreatic elastase-1, Stool [LAB979] 01/02/23 07/04/24    Referral to [facility] [REF26]  06/02/24    Referral to [facility] [REF26]  06/02/24    Referral to General Surgery - [place] [REF27]  08/23/24    Referral to Colorectal Surgery - [place] [REF17] 07/03/23 08/30/24            Scheduled Appointments       Date/Time Provider Specialty    08/03/2023 3:30 PM Azucena Freed., MD Adolescent Medicine            Discharge Medications:      Discharge Medication List        New Medications      TPN FOR DISCHARGE  See most recent TPN order.  Qty: 1 Package  Refills: 0            Continued Medications      buPROPion 150 mg 24 hr tablet  Take 1 tablet (150 mg) by mouth daily  Qty: 90 tablet  Refills: 0  Last Dose: Ask your nurse or doctor  Comment(s): Tolerates milk sugar inactive ingredient. Do not fill until completion of existing script.  Commonly known as: WELLBUTRIN XL     cholecalciferol (vitamin D3) 2000 units tablet  Take 1 tablet (2,000 Units) by mouth daily  Qty: 90 tablet  Refills: 3     EPINEPHrine 0.3 mg/0.3 mL injection auto-injector  Inject 0.3 mL (0.3 mg) into the muscle once as needed for anaphylaxis for up to 1 dose.  Qty: 2 each  Refills: 0  Commonly known as: EPIPEN     guanFACINE 4 mg 24 hr tablet  Take 1 tablet (4 mg) by mouth daily  Qty: 90 tablet  Refills:  0  Last Dose: 4 mg on July 07, 2023  8:10 AM  Comment(s): Tolerates milk sugar inactive ingredient. Do not fill until completion of existing script.  Commonly known as: Intuniv ER     hydrOXYzine 25 mg tablet  Take 25 mg by mouth every 3 (three) hours as needed  Refills: 0  Last Dose: 25 mg on July 03, 2023 10:38 AM  Commonly known as: ATARAX     Ibsrela 50 mg Tab  Take 50 mg by mouth 2 (two) times a day  Refills: 0  Last Dose: 50 mg on July 07, 2023  8:11 AM  Generic drug: tenapanor     levonorgestreL 21 mcg/24hr (up to 8 yrs) 52 mg IUD  1 each by Intra-uterine  route once  Refills: 0  Commonly known as: MIRENA     linaCLOtide 290 mcg  capsule  Take 1 capsule (290 mcg) by mouth daily at least 30 minutes before the first meal of the day on an empty stomach.  Qty: 90 capsule  Refills: 1  Commonly known as: Linzess     Motegrity 2 mg Tab  Take 2 mg by mouth daily  Refills: 0  Generic drug: prucalopride     omalizumab 300 mg/2 mL Atin  Inject 600 mg under the skin every 28 days  Refills: 0     ondansetron 4 MG tablet  Take 2 tablets by mouth every morning At 8 AM and 4 mg by mouth twice daily if needed  Refills: 0  Last Dose: Ask your nurse or doctor     prochlorperazine 10 mg tablet  Take 10 mg by mouth daily as needed  Refills: 0  Commonly known as: COMPAZINE     promethazine 25 mg tablet  Take 50 mg by mouth nightly And 25 mg by mouth daily if needed  Refills: 0  Last Dose: 25 mg on July 06, 2023  8:13 PM  Commonly known as: PHENERGAN     QUEtiapine 100 mg tablet  Take 1 tablet (100 mg) by mouth nightly  Qty: 90 tablet  Refills: 0  Last Dose: 100 mg on July 06, 2023  8:09 PM  Comment(s): Tolerates milk sugar inactive ingredient. Do not fill until completion of existing script.  Commonly known as: SEROquel     SUPREP BOWEL PREP KIT ORAL  Takes 1 to 2 times per week if needed  Refills: 0     traZODone 100 mg tablet  Take 1 tablet (100 mg) by mouth at bedtime as needed for sleep (insomnia)  Qty: 90  tablet  Refills: 0  Last Dose: 100 mg on July 06, 2023  8:14 PM  Comment(s): Tolerates milk sugar inactive ingredient. Do not fill until completion of existing script.  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 persistent symptoms despite medical management and enteral feeding (has PEG in place). Concerned about inability to keep her caloric needs with enteral feeds + oral intake. Frequent vomiting. She felt her best while on parenteral nutrition.  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 Likely dehydrated on arrival. PICC line placed, RD assisted with formula. Hypokalemic but was given Potassium. Tolerated TPN. [facility] infusion assisted with teaching and will be following as outpatient. Patient doing well/basseline on morning of discharge.         Diagnostic Studies:    Labs.     Consultants:    Dietician       Discharge Status   Physical Exam  Vitals and nursing note reviewed.   HENT:      Mouth/Throat:      Mouth: Mucous membranes are moist.      Pharynx: Oropharynx is clear.   Eyes:      General: No scleral icterus.     Extraocular Movements: Extraocular movements intact.   Cardiovascular:      Rate and Rhythm: Normal rate and  regular rhythm.      Pulses: Normal pulses.      Heart sounds: Normal heart sounds.   Pulmonary:      Effort: Pulmonary effort is normal.      Breath sounds: Normal breath sounds.   Abdominal:      General: Abdomen is flat. Bowel sounds are normal. There is no distension.      Tenderness: There is abdominal tenderness. There is no guarding.      Comments: PEG tube in place.    Musculoskeletal:          General: Normal range of motion.      Cervical back: Normal range of motion.   Skin:     General: Skin is warm and dry.   Neurological:      General: No focal deficit present.      Mental Status: She is alert and oriented to person, place, and time.   Psychiatric:         Mood and Affect: Mood normal.         Behavior: Behavior normal.         Thought Content: Thought content normal.         Judgment: Judgment normal.         Discharge Condition:  Afebrile, ambulating, eating, drinking, voiding, pain control acceptable and stable  Disposition:  Home       Documentation   Total time coordinating discharge:      35 minutes     Time spent reviewing records and notes, talking with patient, nurse, and attending, writing notes and orders, was approximately 35 minutes           [address] [place], [place]  07/07/2023 10:45 AM

## 2023-07-07 NOTE — Progress Notes (Signed)
 [facility]   Followup visit          Date: July 07, 2023     Patient: Robin Mendoza Age: 23 y.o.        DOB: March 06, 2001   MRN: [id number] Sex: female       Primary Care Physician: No primary care provider on file. None  None     Cystic fibrosis - Robin Mendoza   Dr. Herbert Mendoza     Robin Highland MD - GYN   Robin Cha Moshe Salisbury., MD     Robin Mendoza is a 23 y.o. female presenting with    Several GI issues, including chronic N/V in the setting of GERD and prior diagnosis of gastroparesis, ARFID, severe chronic idiopathic constipation, chronic malnutrition now dependent on enteral feeds (PEG-J placed by Dr. Percell Miller).   Bmi now 22.    Robin Mendoza., MD  Physician  Specialty: Adolescent Medicine  Mother sent [place] the following :                    History of Present Illness  The patient is a 23 year old individual who presents for evaluation of slow transit constipation. They are accompanied by their parents.    The patient's mother reports that the patient has been diagnosed with colonic inertia, a subtype of slow transit constipation, and gastroparesis. The patient has been experiencing severe slow transit constipation, which has not improved despite being on Motegrity, Linzess, and Aemcolo. They have no detrusor issues. The patient has undergone extensive testing, including an MRI, to investigate the cause of their symptoms. Cystic fibrosis testing was indeterminate, but subsequent studies at [facility] confirmed the absence of cystic fibrosis. The patient has a feeding tube in their small bowel and has been experiencing vomiting, leading to the initiation of total parenteral nutrition (TPN) last week. They have a history of severe pain and nausea, which have been managed with tube feeds. The patient had a PEG-J tube placed in August, which initially led to weight gain and improved well-being, but they have since experienced progressive intolerance to the feeding rate and Suprep,  resulting in vomiting. They have also experienced abdominal distension, appearing as though they are pregnant. The patient has undergone biofeedback therapy for pelvic floor issues and has been taking Dulcolax 20 mg twice daily for the past 2 days, which has resulted in a small bowel movement. They have a scheduled appointment with a geneticist in March 2024. The patient has been on TPN for a couple of weeks, as recommended by their primary gastroenterologist, Dr. Kathryne Gin. They have been taking bisacodyl orally, which has been effective, but rectal administration has not been beneficial. The patient has been advised to continue Dulcolax and increase the dose to facilitate bowel movements every few days. They have been advised to consult a colorectal surgeon due to the risk of obstipation. The patient has been experiencing retrograde vomiting, which has been investigated with a CAT scan and MRI, both of which were normal. They have been advised to send the images for further review. The patient has been advised to continue trying to eat orally and to take bisacodyl tablets to manage their constipation.    ALLERGIES  - Allergic to MILK    MEDICATIONS  - Motegrity  - Linzess  - Aemcolo  - Dulcolax    Records provided from her mother :  Robin Mendoza DOB 13-Feb-2001  Compiled by Robin Highland, MD "mom"  [phone] [email or web address] (link: [email or web address])  DOCTORS:   Primary: Robin Binder MD ([place]): [phone]   Primary GI: Robin Rima MD/ Robin Quitter NP [facility]) [phone]   Motility Specialist GI: Robin Grit MD / Robin Mendoza NPA [facility]): [phone]   Psychologist: Michae Kava, PhD [facility]) [phone]   Psychiatrists: Robin Skeans, PA [place]) [phone]      MEDICAL HISTORY:   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   Spring  21: Months of SEVERE abd pain with n/v.using laxatives for constipation. 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 New DX:   Gastroparesis. Pain, nausea, vomiting and weight loss continued to worsen (BMI 16).   Fall 22: Ten day Hospitalization for nutrition and refeeding Robin Mendoza)  Winter 22: Robin Mendoza, Dx of ARFID due to gastroparesis to have consistent small meals.    Spring 23: Surpassed her baseline weight of 135#. Pain decreased but still struggled with early     satiety, nausea, constipation and began slowly losing weight.   Summer 24: Three week hospitalization for n/v, severe constipation([place])    July 24: Saw motility specialist Robin Grit, MD:     Sitz Capsule Study: 4 in distal ileum, 9 in ascending & 10 transverse colon    CF Sweat Test: indeterminate 41& 44, but nl DNA by MAP @ [facility]    Breath Test: negative    Anal Manometry:      1. High resting pressure at first but able to relax. normal squeeze pressure & normal          sustained squeeze with spasms noted. Anal hypertension with normal anal contractility       2. Abn anorectal co-coordination with poor propulsion & paradoxical contractions          noted on push & rectal prolapse was seen.      3. RAIR is present.      4. Normal BET at 51 seconds.       5. Cough reflex is normal at rectal pressure of 33 mmHg & anal pressure of 149 mmHg.      6. Blunted sensation @ 70, nl urge @ 100. Hypersensitivity @ 140. Rectal prolapse.    MRI Defecography:     1. Dynamics: Appropriate angle changes (approximately 108 degrees at rest,         decreased to 98 degrees with squeezing, and increased to approximately 132 degrees      with straining with near complete contrast evacuation.      2. Hiatal enlargement: Mild. (7.5cm)     3. Pelvic floor descent: Moderate to severe (6.0cm)     4. Pelvic organ prolapse: Present with moderate anterior rectocele, mild to         moderate  cervical prolapse, and mild bladder cystocele.   August 24:  PEG J placed, noted to have narrowing in 3rd portion of duodenum.   Sept 24:  1 week hospitalization to evaluate narrowing noted at PEG-J placement and confirmed on UGI   Feb 25: 4 days hospitalization to start TPN ([place])        MEDS:  Intuniv ER 4 mg QAM  Motegrity 2mg  QAM  Linzess 290 mcg QAM  Ibserla 50 mg BID    Seroquel 100mg  QHS  Trazodone 50 mg QHS  Wellbutrin  XL 150mg  QAM  Vitamin D3 125 mg QAM  Mirena IUD  Zofran 8 mg QAM, 4mg  BID/PRN  Phenergan 50 mg QHS, 25mg  PRN  Compazine 10 mg QD PRN  Xolair 600 mg IM Q4 wks   Epipen PRN  PROCEDURAL AND RADIOLOGIC HISTORY:  11/24: MRI Defecography [facility]):   Indication: evaluate for dyssynergic defecation    Findings: 1. Dynamics: Appropriate angle changes (approximately 108 degrees at rest,    decreased to 98 degrees with squeezing, and increased to approximately 132 degrees with   straining) with near complete contrast evacuation.    2. Hiatal enlargement: Mild. (7.5cm)   3. Pelvic floor descent: Moderate to severe (6.0cm)   4. Pelvic organ prolapse: Present with moderate anterior rectocele, mild to moderate cervical    prolapse, and mild bladder cystocele.  11/24: Abd/Pelvic CT [employer] [place]):   Indication: worsening pain after PEG-J change   Finding: no acute findings  10/24: PEG- J replacement ([place]): 72F [facility] Catheter with ? 37 F J tube    Indication: tube cracked   Findings:  Note is made that "the bowel demonstrates very little peristalsis and gastroparesis is present.   Eventually, I was able to place the 24 [place] gastrojejunostomy catheter with the distal aspect of the   jejunum and the proximal port in the stomach".  9/24: ABD CT enterography [facility])   Indication: Duodenal narrowing on UGI   Findings: Hypo-dense contrast is present within the small bowel loops. I do not appreciate    convincing abnormal small bowel wall thickening particularly in the distal portion of the     duodenum and proximal jejunum. No bowel obstruction. Hyper-dense contrast and gas are    distending the ascending and transverse colon. The descending and sigmoid colon are    decompressed. The wall in these vicinities again appears thickened.   9/24: ABD & pelvic CT [facility]):   Indications: 5cm narrowing in duodenum, persistent n/v   Findings:  Significant stool burden noted within the right colon. Relative decompression of the    descending colon with diffuse mild wall thickening versus decompression.  No significant    pericolonic inflammatory fat stranding.  Cannot exclude colitis.  9/24: UGI [facility]):   Indications: contrast filling defect noted during placement of PEG J   Findings: 5 cm narrowing in 2nd/3rd portion of duodenum.  8/24: EGD# 5/ PEG-J [facility]): 24 Fr B Endovive PEG tube w/12 Fr J-tube   Indications: nutrition support   Findings: The examined part of duodenum was normal in the first and second portion. The duodenum    in the 3rd portion had a narrowing, beyond which contrast was not flowing. Hence I advanced    the scope into the duodenum and snared the J tube past the narrowed portion. The anatomy    raises the possibility of SMA syndrome like anatomy as a cause of patients' symptoms. Nonetheless, the    mucosa was normal. No stricture was noted at this level.  8/24: Anal Manometry [facility]):    Indications: chronic constipation   Findings: 1. High resting pressure at first but was able to relax. normal squeeze pressure & normal    sustained squeeze with spasms noted. Anal hypertension with normal anal contractility  2. Abn   anorectal co-coordination with poor propulsion and paradoxical contractions noted on push &    rectal prolapse was seen. 3. RAIR is present. 4. Normal BET at 51 seconds.  5. Cough reflex is  normal at rectal pressure of 33 mmHg  and anal pressure of 149 mmHg . 6. Blunted sensation at    70, normal urge at 100. Hypersensitivity at 140 . Rectal prolapse is  seen.  8/24: Breath Test [facility]):    Indications: rule out SIBO   Findings: negative  8/24: Sitz marker D#6 [employer] [place]):    Indications: chronic constipation, ? Colonic inertia   Findings: D#5 showed 4 distal ileum, 9 ascending colon, 10 transverse colon, severe constipation/impaction  7/24: Colonoscopy #4 Nl w/ Bx ([place])   Indications: n/v/abd distension   Findings: normal colon, Bx's neg  7/24: Gastrografin enema with fluoro ([place])   Indications: constipation and presumed motility disorder   Findings: Preliminary scout image shows gaseous distention of entire colon. There are loops of    gas-distended small bowel in the central abdomen. There was prompt passage of contrast    throughout the colon to the cecum. The colon is uniform in caliber with mild gaseous distention.    No evidence of colonic stricture or obstruction. Scant filling defects throughout the colon are    consistent with stool.  7/24: MRI Enterography intra luminal contrast entered at level of NJT, NOT orally ([place])   Indications: N/V/ abd pain, inability to tolerate NJT feeds   Findings:  Increased caliber of the colon along with increased stool content suggestive of    underlying constipation. Visualized small bowel shows no pathologic distention, wall thickening    or mucosal hyper-enhancement. The terminal ileum is grossly normal  7/24: Modified CT Angiogram Abdomen, NO oral Contrast ([place])   Indications: questionable SMA syndrome, inability to tolerate NJT feeds   Findings: no vascular stenosis, moderate stool, no distention or inflammation  10/22 :Extended Holter monitor [facility])  Indications:  heart variations 40-180's at rest, confirmed on apple watch  Results: The observed rhythms are sinus bradycardia to sinus tachycardia. The Maximum Heart Rate recorded was 166 bpm, Day 4 / 08:42:36 am, the Minimum Heart Rate recorded was 44  bpm, Day 1 / 10:42:56 pm and the Average Heart Rate was 73 bpm. There were 5 PVCs with a burden  of < 0.01 %. There were 26 PSVCs with a burden of < 0.01 %. There was 1 occurrence of Supraventricular Tachycardia with the longest episode 3 beats, Day 2 / 04:20:12 pm and the fastest episode 101 bpm, Day 2 / 04:20:12 pm. There were 18 Patient reported events.  10/22: Brain MRI [facility])  Indications: intractable nausea and vomiting  Findings:  No acute intracranial abnormality.  9/22: ABD & PELVIS CT [facility])   Indications: acute on chronic abd pain, nausea and vomiting   Findings: Trace to small pleural effusions in both lung bases. Left upper abdominal small bowel   intussusception, most often a transient and self-limited process. Cholecystectomy.  7/22:  COLONOSCOPY #3 NL w/ Bx ([facility])   Indications: ileitis, RUQ pain, loose stools   Findings: no evidence of IBD, consistent with rectal prolapse  6/22: CT Enterography with some contrast   Indications: moderate RUQ pain, no vomiting, 30# weight loss.    Findings: no abn bowel dilatation, but small bowel loops are prominent @ 2.9cm in diameter.    No acute inflammatory changes of small bowel seen. There is a short segment of more distal    ileum seen which demonstrates mucosal enhancement and wall thickening & could be inflamed.     Some fecalization is seen in the terminal ileum. There is abnormal wall thickening and  enhancement throughout the colon from cecal tip to the rectum.  6/22: ABD & PELVIC CT #2 [facility])   Indications: acute on chronic moderate vomiting and generalized abd pain.    Findings #1 [employer] education [employer] [place] read): Moderate stool in the colon. Apparent mild wall thickening of    the sigmoid colon. No evidence of colitis, diverticulitis or bowel obstruction.    Findings #2 ([facility] re-read): new nonspecific thickening of descending & sigmoid colon w/    proximal gaseous colonic distention, findings may be secondary to an infectious or inflammatory    colitis, which includes the possibility of underlying inflammatory bowel disease.  5/22:  CHOLECYSTECTOMY & APPENDECTOMY [facility])   Indication: severe pain, worse with eating, "trying anything to get rid of pain"   Findings: focal chronic inflammation in gallbladder, nl appendix  5/22: COLONOSCOPY #2 [facility])   Indications: RLQ pain, altered bowel habits, abn CT, weight loss   Findings:neg bx in ileum & ascending colon. rectal bx c/w rectal prolapse  4/22: CT ABD & PELVIS #1 ([place])   Indications: abd pain, n/v   Findings: Pronounced wall thickening of terminal ileum, focal cecal wall thickening, diffuse    colonic distention & mucosal hyper-enhancement from cecum to rectum. This is a terminal ileitis    & diffuse colitis. In a patient of this age, underlying inflammatory bowel disease is likely.  4/22: UGI [facility])   Indication: abd pain   Findings: Normal  3/22: EGD #5 w/ dilatation [facility])   Indications: GERD, EoE, abd pain   Findings: no EOE on Bx; retained food despite NPO for 12 hours  6/21: ABD [place] [facility])   Indications: RUQ pain, nausea and vomiting   Findings: nl  4/21: EGD #4 w/ dilatation ([place])   Indications: EOE, Nausea and vomiting   Findings: Esophageal bx w/ some hpf > 25 eosinophils. DX: EOE, improved w/dilation  9/20: COLONOSCOPY #1 ([place])   Indication: constipation, abd pain, hematochezia    Findings: nl, small internal hemorrhoids   6/20: ABD [place]: ([place])   Indications: RLQ pain   Findings: Nl  11/17: EGD #3 ([place])   Indication: milk allergy, chronic reflux   Findings: Nl  EGD 1 & 2 as infant/toddler for GERD ([place])    BLOOD AND STOOL STUDIES:  1/24: [facility] 2200 GI Effects Comprehensive Profile- loose Stool   pancreatic elastase, protein breakdown, fecal fat, Calprotectin, EPX, Fecal     Secretory IgA >7500 mcg/ml *, short chain fatty acids, Beta-glucuronidase   Gastrointestinal Microbiome PCR  Faecalibacterium prausnitzii    Gastrointestinal Microbiome Culture Klebsiella pneumoniae (4+), Candida albicans  (2+)   Gastrointestinal Microbiome Parasitology (O&P, PCR)  2022: Blood Work:    ANA, TSH, FT4, Thyroid peroxidase Ab, C reactive protein, C peptide, lipase, amylase, sed rate,    IgE/IgE (283), IgG, IgA (300), IgM , IgG4, Hbg A1C, cortisol, HIV, EBV IgM, EBV IgG, CMV IgG,    Strongyloides IgG, Girardia Ag by EIA, C diff toxin, Calprotectin, C-1 Esterase inhib, Tryptase,    Alpha Gal IgE, Anti-Neutrophil Cytoplasmic Ab, C3/C4 complement, Celiac disease panel,    Porphobilinogen.  2022: Stool Studies:   O&P, Cultures, H Pylori Ag, Lactoferrin, Alpha-1-Antitrypsin, Calprotectin, Fecal Fat,    Gastrointestinal Molecular Panel, Pancreatic Elastase (112,160)   [name] Chrobak is a 23 y.o. female presenting with  with ASD -high functining who presents for evaluation of gastroparesis, suspected gastroesophageal reflux disease (GERD), decreased weight, and decreased appetite. She is accompanied  by her mother.   Bowel regimen:  She is using Prucalopride and linzess 290  Suppostiory everyday - no difference  Miralax TID through her J tube  She will use 16 ounces of suprep every 10 days  She will get dehydrated with this     She is still nausated - she will take 8 mg zofran once a day  She will sub phenergan and compazine  Last note:   Recent hospitalization for colonic dysmotility- stool impaction and NG tube placement for golytely adminitration - up to 3 liters inhouse with enemas etc  Hx Eating disorder- bulimia and has been in eating disorder facilities-      Since last visit she had a sweat chloride test that was high.  She saw pulmonology who sent out cystic fibrosis genetic labs via [facility].  These are still pending.       She had a PEG-J placed due to malnutrition in August.  During the PEG-J placement he noticed duodenal narrowing in the third portion and contrast was not flowing.  Per mother, the advanced endoscopist did not think this was SMA syndrome no stricture was noted at this level.     She had an MRI  enterography Juy 2024 - however, she had an NJ at the time - and contrast was placed through the NJ - therefore surpassed the duodenum     Sitz marker study with 4 in her distal ileum, 9 in her ascending colon, tender transverse colon     She recently had an anorectal manometry that showed high sphincter pressures at rest.  Dyssynergy was noted on ARM but balloon expulsion was normal.  She had rectal hyposensitivity.   She went to 1 t session of pelvic floor physical therapy which helped tremendously.  They said that she did not need to go back and that now she is relaxing her internal sphincter    GI Procedures Hx:  ARM 12/2022    Impressions  High sphincter pressures at rest that corrected with time and dyssynergia on ARM but BET was normal. Rectal hyposensitivity noted with blunted sensation and MTV was low.  Consider MR defecagraphy to rule out Dyssenergic defecation and assess structural abnormalities. Consider biofeedback therapy for dyssynergic defecation given high external sphincter pressures.  Of note pt had an outside Sweat Chloride test which was 41 on left and 44 on right reportedly and DNA testing is pending. Not sure if this is the wide sequencing genetic testing but will ask.     EGD 01/08/2023    Impression:  --Normal esophagus, stomach and duodenum  --A 24 [place] [employer] externally removable Endovive PEG tube was successfully placed, with an associated 12 [place] J-tube portion.  --Normal examined duodenum. There is a focal narrowing in the 2nd portion of the duodenum    Imaging:  - Right upper quadrant ultrasound 01/2021:   Unremarkable, no intrahepatic bile duct dilatation.  S/p cholecystectomy.     - CT abdomen pelvis 01/2021:  1. Trace to small pleural effusions in both lung bases.   2. Left upper abdominal small bowel intussusception, most often a transient and self-limited process.   3. Cholecystectomy.      - KUB 12/2020  FINDINGS:   Bowel: No dilated loops of bowel. Moderate-large  volume stool burden in the right colon.   Soft tissues: IUD overlies the right pelvis. Surgical clips in the right upper quadrant.   Bones: The bony skeleton is intact.   IMPRESSION:   Non-obstructive  bowel gas pattern. Moderate-large volume stool burden in the right colon.     - CTe 11/08/20  IMPRESSION:   1. No acute inflammatory changes of the most terminal ileum. However, there is a short segment of distal small bowel occurring approximately 3 cm proximal to the ileocecal valve which demonstrate suggested inflammatory changes. Additional inflammatory changes are seen throughout the colon. Although nonspecific, an inflammatory etiology would be favored.   2. Distended small bowel without evidence for significant obstruction at this time.     - CT ABDOMINAL 10/22/20  Impression  1. New nonspecific thickening of the descending and sigmoid colon with  more proximal gaseous colonic distension; findings may be secondary to an  infectious or inflammatory colitis, which includes the possibility of  underlying inflammatory bowel disease.  2. Small volume pelvic free fluid, increased from prior, which may be  reactive  Please note: Our interpretation of studies performed at an outside  institution is limited by factors including absence of technical specifics  of the image, undisclosed clinical information and the unavailability of  the original interpretation. Specialists at the institution that performed  this study may have access to information not available to [place] that could  make a difference in the interpretation. We suggest that you obtain the  original interpretation from the site where the study was performed.    - CT A/P w/ contrast 10/22/20 outside   IMPRESSION:   1. Moderate free fluid in the pelvis, nonspecific but may be secondary to   ruptured ovarian cyst.   2. Moderate stool volume in the colon. Apparent mild wall thickening of the   sigmoid colon is most likely due to underdistention.   3. No evidence of  colitis, diverticulitis or bowel obstruction. No   hydronephrosis.     - CT Abd pelvis 09/07/20:  IMPRESSION   1. There is pronounced wall thickening of the terminal ileum, with focal cecal wall thickening. There is diffuse colonic distention and mucosal hyperenhancement from the cecum through the rectum, worst at the rectum. This is   a terminal ileitis and diffuse colitis. In a patient of this age, underlying inflammatory bowel disease is likely.   2. There is no evidence of bowel perforation, obstruction, or intraperitoneal   fluid collection.   3. Full features are as detailed above.     - X-ray UGI 08/17/20  FINDINGS: The scout radiograph is normal. The esophagus is normal in course,   caliber, and mural appearance. No focal strictures or diverticula. There is no   hiatal hernia. Contrast transits the GE junction without delay. There is a   normal rugal fold appearance. The greater and lesser curvatures are intact   without obvious defect. The duodenal bulb and C-sweep of the duodenum are unremarkable. There is normal motility. The patient is able to swallow a barium tablet without delay.   IMPRESSION   Normal upper GI evaluation. No anatomic abnormality appreciated.      Endoscopy:  -Colonoscopy 11/2020:   - The examined portion of the ileum was normal. Biopsied.  - Congested mucosa in the cecum and at the appendiceal orifice. Note recent appendectomy  - Normal mucosa in the proximal rectum, in the mid rectum, in the sigmoid colon, in the descending colon, in the  transverse colon, in the ascending colon and in the cecum.  - Normal mucosa in the ascending colon. Biopsied.  - Normal mucosa in the rectum and in the distal rectum in the area of ulcer .  Biopsied.  - A single (solitary) ulcer in the distal rectum. Biopsied. Question etiology, Question stercoral ulcer, prolapse, etc.  no surrounding inflammation,  - Non-bleeding hemorrhoids.  - The examination was otherwise normal.      Pathology: Rectal biopsies with  reactive changes and focal lamina propria fibrosis suggestive of prolapse. No pathologic findings the remainder of the biopsies.       - Colonoscopy 09/2020:   - Normal appearance, however was limited due to poor prep.  No pathologic findings on biopsies.     - EGD 07/2020: (report not available)  - Report not available except for pathology listed below. Per chart review, there was retained food despite patient being NPO for procedure.   - Fragments of benign squamous epithelium with patchy chronic inflammation, features of eosinophilic esophagitis not identified.      - EGD/2021: (report not available)  - Esophageal biopsies with some high-power fields greater than 25 eosinophils.  These numbers of eosinophils, caused by a number of etiologies including reflux esophagitis, although can be associated with eosinophilic esophagitis.     Past Medical History  She has a past medical history of Anxiety, Autism, and Gastroparesis.  EOE   GERD  Eating disorder  Anxiety  Constipation and abd pain  Surgical History  She has a past surgical history that includes Cholecystectomy; Appendectomy; and PEG tube placement (01/13/2023).     Social History  She reports that she has never smoked. She has never used smokeless tobacco. She reports current alcohol use. She reports that she does not use drugs.    Family History  No family history on file.      Allergies  Milk containing products (dairy), Cat dander, Cat hair standardized allergenic extract, and Dog dander      Current Outpatient Medications:   .  acetaminophen (TYLENOL) 500 mg tablet, Take 1,000 mg by mouth every 6 (six) hours as needed., Disp: , Rfl:   .  albuterol HFA (PROVENTIL HFA;VENTOLIN HFA;PROAIR HFA) 90 mcg/actuation inhaler, Inhale 2 puffs every 4 (four) hours as needed., Disp: , Rfl:   .  buPROPion (WELLBUTRIN XL) 150 mg 24 hr tablet, Take 150 mg by mouth in the morning., Disp: , Rfl:   .  busPIRone (BUSPAR) 10 mg tablet, Take 10 mg by mouth., Disp: , Rfl:   .   calcium carbonate-vitamin D3 (OYSTER SHELL) 250 mg-3.125 mcg (125 unit) tab per tablet, Take 1 tablet by mouth., Disp: , Rfl:   .  diphenhydrAMINE 12.5 mg chew, Chew 50 mg every morning., Disp: , Rfl:   .  EPINEPHrine (EPIPEN) 0.3 mg/0.3 mL injection syringe, , Disp: , Rfl:   .  guanFACINE (INTUNIV) 4 mg Tb24, Take 4 mg by mouth daily., Disp: , Rfl:   .  hydrOXYzine (ATARAX) 50 mg tablet, , Disp: , Rfl:   .  levonorgestreL (MIRENA) 21 mcg/24 hr (8 yrs) 52 mg IUD, 1 each by intrauterine route., Disp: , Rfl:   .  linaCLOtide (LINZESS) 290 mcg cap capsule, Take 290 mcg by mouth daily., Disp: , Rfl:   .  LORazepam (ATIVAN) 0.5 mg tablet, , Disp: , Rfl:   .  lubiprostone (AMITIZA) 8 mcg capsule, Take 1 capsule (8 mcg total) by mouth in the morning and 1 capsule (8 mcg total) in the evening. Take with meals., Disp: 60 capsule, Rfl: 0  .  Motegrity 2 mg tablet, Take 1 tablet (2 mg total) by mouth daily., Disp: 90 tablet, Rfl: 3  .  ondansetron (ZOFRAN) 4 mg tablet, Take 4 mg by mouth every 8 (eight) hours as needed for nausea or vomiting., Disp: , Rfl:   .  ondansetron (ZOFRAN) 8 mg tablet, Take 8 mg by mouth every 8 (eight) hours as needed for nausea or vomiting., Disp: , Rfl:   .  prochlorperazine (COMPAZINE) 10 mg tablet, Take 10 mg by mouth every 6 (six) hours as needed for nausea or vomiting., Disp: , Rfl:   .  promethazine (PHENERGAN) 25 mg suppository, Insert 25 mg into the rectum., Disp: , Rfl:   .  promethazine (PHENERGAN) 50 mg tablet, , Disp: , Rfl:   .  QUEtiapine (SEROquel) 100 mg tablet, Take 100 mg by mouth., Disp: , Rfl:   .  sertraline (ZOLOFT) 50 mg tablet, Take 50 mg by mouth daily. (Patient not taking: Reported on 01/29/2023), Disp: , Rfl:   .  simethicone 125 mg tab, Take 1 tablet by mouth every 4 (four) hours as needed., Disp: , Rfl:   .  tenapanor (Ibsrela) 50 mg tab, Take 1 each (50 mg total) by mouth 2 (two) times a day., Disp: 180 tablet, Rfl: 3  .  traZODone (DESYREL) 100 mg tablet, Take 100 mg by  mouth at bedtime., Disp: , Rfl:   .  Xolair 300 mg/2 mL syrg syringe, , Disp: , Rfl:      ROS  14 point review of systems is negative other than noted in HPI and otherwise as noted on comprehensive GI history    Objective:   Vitals: BP: 97/62, Temp: 97.2 F (36.2 C), Temp Source: Temporal, Heart Rate: 82, SpO2: 99 %, Height: 1.651 m (5\' 5" ), Weight: 62.1 kg (137 lb); BMI (Calculated): 22.8     PHYSICAL EXAM:  General: Well-developed, well-nourished, no apparent distress.  Head: Normocephalic, atraumatic.  Eye: Normal conjunctiva, extraocular motion intact, anicteric sclera.  Neck: Trachea midline, supple, no thyromegaly noted.  Cardiovascular: Regular rate, normal rhythm, no murmur/gallop/rub.  Respiratory: Clear to auscultation bilaterally, normal inspiratory and expiratory effort.  Abdomen: Soft, non-distended, non-tender to palpation throughout the abdomen, no masses noted, no hepatosplenomegaly.    Extremities: No edema, no gross deformity, no noted trauma.  Neurologic: Awake, alert, and oriented x 3, cranial nerves II-XII grossly intact.    Psychiatric: Cooperative, appropriate affect.    LABS / IMAGING:    No results found for: "WBC", "HGB", "HCT", "MCV", "PLT"    No results found for: "INR", "PROTIME"    No results found for: "GLUCOSE", "CALCIUM", "NA", "K", "CO2", "CL", "BUN", "CREATININE"    No results found for: "ALT", "AST", "GGT", "BILITOT"    MRI Pelvis WO Contrast  Narrative: DATE OF SERVICE:  03/16/2023    EXAM:  MRI PELVIS WO CONTRAST    CLINICAL HISTORY:  Dyssynegia suspected in patient with suspected CF, Uterine fibroids, signs or symptomatic, defecography for pelvic floor disorder eval, Outlet dysfunction constipation Dyssynegia suspected in patient with suspected CF, Uterine fibroids, signs or symptomatic, defecography for pelvic floor disorder eval, Outlet dysfunction constipation    COMPARISON:  No Comparison.    TECHNIQUE:  Multiplanar, multisequence images were obtained of the pelvis after  the intrarectal administration of 100 mL sonographic gel. No intravascular contrast administration. Cine imaging was performed during pelvic floor maneuvers (straining, squeezing, evacuation).    CONTRAST:  No intravascular contrast.    FINDINGS:  The anteroposterior dimension of the pubococcygeal hiatus (pubococcygeal line) is 10.1 cm.    The levator hiatus (H-line) measures 7.5 cm at maximum  stress during evacuation.    The pelvic floor descent (M-line) measures 6.0 cm at maximum stress.    ANTERIOR COMPARTMENT:    Urinary bladder drops below the pubococcygeal line throughout during defecation. No significant urethral hypermobility.    MIDDLE COMPARTMENT: Symmetric size of the ovaries permeated by normal appearing follicles. Appropriately positioned intrauterine device.    The cervix and/vaginal apex remains above the pubococcygeal line throughout the study.    CUL-DE-SAC:    No pathology in the posterior cul-de-sac.    POSTERIOR COMPARTMENT:    Anorectal angle measured approximately 108 degrees at rest, decreased to 98 degrees with squeezing, and increased to approximately 132 degrees with straining.    Near complete contrast was evacuated during the examination.    Trace free fluid in the pelvis likely physiologic.  Impression: 1. Dynamics: Appropriate angle changes during the phases of defecation with near complete contrast evacuation.  2. Hiatal enlargement: Mild.  3. Pelvic floor descent: Moderate to severe.  4. Pelvic organ prolapse: Present with moderate anterior rectocele, mild to moderate cervical prolapse, and mild bladder cystocele.    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 female presenting with  with ASD -high functining who presents for evaluation of gastroparesis, suspected gastroesophageal reflux disease (GERD), decreased weight, and decreased appetite. She is accompanied by her mother.     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  She was diagnosed with gastroparesis via upper endoscopy.  She never had a gastric emptying scan - I do not think she could tolerate egg sandwhich at this time.  She had a sitz marker study with 4 findings in her distal ileum, 9 in her ascending colon, 10 in her transverse colon.     Constipation - slow transit    She had a sitz marker study with 4 findings in her distal ileum, 9 in her ascending colon, 10 in her transverse colon.  Anorectal manometry recently done that showed high resting sphincter tones, however she was able to expel the balloon.  We ordered an MR defecography however this was not done yet - it is scheduled for November.  She is currently on 290 Linzess along with Motegrity.  She takes Suprep every 10  days which provides her relief.  She also take suppositories daily.  She went to 1 session of pelvic floor physical therapy and states that she did not need to come back  PLAN  -Add ibsrela 50 mg BID. 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  She currently has a cystic fibrosis genetic testing pending.  This was [facility] to [facility].  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  She is currently feeding through the J.  She still has some nausea and vomiting daily.  She is not venting through the G     She is also complaining that the PEG-J is leaking from the tube feeding site.  She had a 24 french removable endo vive placed - She does not have an ENFit.  Spent some time online looking at options -she can ask her to gastro for an enfit adapter.  We do not have these in clinic.  This might help with leaking, since the rubber on the tube does not stay closed.  She also bought a clamp on [employer] which will be beneficial to help with leaking. Her site looks great - no concern for infection.      Duodenal narrowing noted on recent EGD- CTE and MRE per mom were negative   UGI shows some narrowing.    Duodenal narrowing was noted in the third portion of the duodenum.  Contrast was not flowing per report.  She does have a history of MR enterography back in July 2024.  However this was done through an [place] while she was hospitalized therefore it surpassed the duodenum.  Did not show pathologic distention or stricturing.  -She has an upper GI series ordered for tomorrow.  She will let us know these results.  They are to place the contrast through the G-tube so we can assess the entire small bowel  - if there is an obstruction or narrowing - will order an  MRE and angio together. If there is no obstruction - this is a pseudo-obstruction      She is to discuss next steps AFTER her cystic fibrosis tests and MR def. Are back. We need reports and data prior to next steps. She has a general GI in [place] interm. She will send [place] results   .  Assessment & Plan  1. 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 Vomiting, abdominal distension, and intolerance to feeding rate and Suprep. Recent initiation of total parenteral nutrition (TPN). PEG-J tube placed in August.  - Continue Dulcolax 20 mg twice daily and increase dose as needed to facilitate bowel movements.  - Continue bisacodyl tablets orally.  - Consult a colorectal surgeon due to the risk of obstipation.  - Send CAT scan and MRI images for further review.  - Continue trying to eat  orally.    Follow-up  - Scheduled appointment with a geneticist in March 2024.       [facility]  [employer] [facility]  [email or web address]   [phone]         Robin Grit MD  Director of Motility  [facility]

## 2023-07-14 MED FILL — XOLAIR 300MG/2ML SOSY: 300 300 MG/2ML | SUBCUTANEOUS | 28 days supply | Qty: 4 | Fill #0 | Status: AC

## 2023-07-15 ENCOUNTER — Encounter

## 2023-07-20 MED FILL — TRAZODONE HCL 100MG TABS: 100 100 MG | 90 days supply | Qty: 90 | Fill #0 | Status: AC

## 2023-07-20 MED FILL — BUPROPION HCL ER (XL) 150MG TB24: 150 150 MG | 90 days supply | Qty: 90 | Fill #0 | Status: AC

## 2023-07-20 MED FILL — LINZESS 290MCG CAPS: 290 290 MCG | 90 days supply | Qty: 90 | Fill #1 | Status: AC

## 2023-07-23 NOTE — Care Coordination-Inpatient (Signed)
 Ambulatory Care Coordination Note     07/23/2023 1:05 PM     Parent outreach attempt by this ACM today to perform care management follow up . ACM was unable to reach the parent by telephone today;   left voice message requesting a return phone call to this ACM.     ACM: Dyann Kief, RN     Care Summary Note: Telephonic outreach to the patient to follow up regarding requests from the patient's mother, Carollee Herter. Left a discreet VM. Will continue outreach attempts     PCP/Specialist follow up:       Follow Up:   Plan for next ACM outreach in approximately 1-2 days  to complete:  - goal progression  - education .

## 2023-07-25 ENCOUNTER — Emergency Department: Admit: 2023-07-25 | Payer: PRIVATE HEALTH INSURANCE

## 2023-07-25 ENCOUNTER — Inpatient Hospital Stay
Admit: 2023-07-25 | Discharge: 2023-07-25 | Disposition: A | Discharge status: Home / Self Care | Payer: PRIVATE HEALTH INSURANCE

## 2023-07-25 DIAGNOSIS — K9423 Gastrostomy malfunction: Secondary | ICD-10-CM

## 2023-07-25 MED ORDER — HYDROCODONE-ACETAMINOPHEN 5-325 MG PO TABS
5-325 | ORAL | Status: AC
Start: 2023-07-25 — End: 2023-07-25
  Administered 2023-07-25: 22:00:00 1 via ORAL

## 2023-07-25 MED ORDER — ONDANSETRON 4 MG PO TBDP
4 | ORAL | Status: DC
Start: 2023-07-25 — End: 2023-07-25

## 2023-07-25 MED FILL — HYDROCODONE-ACETAMINOPHEN 5-325 MG PO TABS: 5-325 MG | ORAL | Qty: 1 | Fill #0

## 2023-07-25 NOTE — Discharge Instructions (Addendum)
 Seen in the emergency department today for your JPEG spontaneously popping out.    You were given some narcotic pain medication and an x-ray was obtained.    Before procedural sedation your tube completely popped out.    You do have a lot of gas within your bowels.  You may be having some gas leaking out of your PEG tube site.  This will be fine.    Area might drain a little bit this will be fine as well.    Narcotics will cause grogginess, fogginess, sleepiness.  Do not drive or drink any alcohol today.  Take caution today.    Contact your gastroenterologist on Monday and let them know that your JPEG spontaneously popped out.  Let them know that you elected not to have it replaced.    Change nonstick bandage frequently as it may be wet.    Return to the emergency department if there is significant redness around the site, you are developing fevers, general worsening of your condition    Thank you for being patient with [place] today.

## 2023-07-25 NOTE — ED Triage Notes (Signed)
 Patient arrived with her father- "PEG tube is coming out" and severe abdominal pain.

## 2023-07-25 NOTE — ED Provider Notes (Signed)
 Emergency Department Provider Note       PCP: Azucena Freed., MD   Age: 23 y.o.   Sex: female     DISPOSITION Decision To Discharge 07/25/2023 06:01:11 PM    ICD-10-CM    1. PEG tube malfunction [facility])  [id number]           Medical Decision Making     Vital signs reviewed, patient stable, NAD, afebrile, nontoxic in appearance     HR 123 in triage, down to 113. Pt states that she is in a lot of pain    Discussed the pt with ER Attending, Dr. Grace Isaac.   Discussed with pt and father that we can try to replace the tube if she is still using it or remove if she isn't. Discussed that if she needs j tube in the future, she will need another procedure to place one.     Pt and father state that pt has not been using tube over the past month and they opted to have tube removed.    Will order po norco for pain and avoid accessing picc line.  Pt took her own zofran.    Will obtain kub portable to assess amount of tube tube within abdomen    Currently deflated balloon outside of abd    Pt states that if there is a lot of tube within abd, she will need sedation for removal     X-ray obtained I do not see any intra-abdominal balloon.  I do think that there was only 1 bloom on this PEG tube.  Patient stated that she was having a lot of pain after x-ray was obtained.  She stated that she is going to need some sedation for removal.  I discussed with her risks associated with sedation.  She verbalized understanding so did her father as she has been sedated before for prior procedures    Discussed sedation with ER attending, Dr. Grace Isaac.  Will have patient placed in a room to start an IV so we do not access for PICC line as this is where she receives parent terminal nutrition to avoid contamination, charge nurse obtained a crash cart and we were setting up to place patient in a room when I was located by the father and he stated that PEG tube spontaneously completely popped out.  No sedation performed.    Patient states she is  feeling great.  She is not having any pain.  There is no bleeding or drainage from PEG tube site.    Clean the area gingerly with some skin cleaner and placed a nonstick bandage over the wound.    Based on history, physical exam, no indication for blood work or additional imaging.  Patient states she is pain-free now that the PEG tube has popped out.  She is to follow-up with her gastroenterologist and give them a call on Monday.    Discussed with him wound care, keeping covered with a nonstick bandage.    Discussed with him cautions regarding narcotic medication that she received today.  Patient and her father verbalized understanding agreement    X-ray today demonstrated normal bowel gas pattern.  No evidence of obstruction.  There are some cholecystectomy clips.  Current percutaneous gastrotomy tube with no intraluminal component seen.    Patient discharged home in stable condition.  Strict return to ED precautions discussed and reiterated in discharge paperwork.      ED Course as of 07/25/23 1825   Sat Jul 25, 2023   1758 J-tube spontaneously popped out [JG]   1800 XR abdomenFINDINGS:  There is a normal bowel gas pattern. There is no evidence of  obstruction.  No renal calculi are seen. Lung bases are clear. Cholecystectomy  clips. Percutaneous gastrostomy tube with no intraluminal component seen.     IMPRESSION:  Percutaneous gastrostomy tube with no intraluminal component seen   [JG]      ED Course User Index  [JG] Sonnie Alamo E, PA     1 acute, uncomplicated illness or injury.  Over the counter drug management performed.  Patient was discharged risks and benefits of hospitalization were considered.  Shared medical decision making was utilized in creating the patients health plan today.  I independently ordered and reviewed each unique test.    I reviewed external records: ED visit note from a different ED.   I reviewed external records: provider visit note from PCP.   Reviewed notes from gastroenterology  07/23/2023; reviewed notes from general surgery 07/01/2018  The patients assessment required an independent historian: Aspects of history of present illness and past medical history provided by father.  The reason they were needed is autism spectrum.    I interpreted the X-rays X-ray today demonstrated normal bowel gas pattern.  No evidence of obstruction.  There are some cholecystectomy clips.  Current percutaneous gastrotomy tube with no intraluminal component seen I am in agreement with radiologist interpretation.              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  They state that patient's PEG tube started to come out about 30 minutes ago.  This PEG was replaced in October 2024.  Initial PEG was placed by Dr. Percell Miller replacement PEG was done by interventional radiology here at [facility].  Patient is in a lot of pain denies any fevers.  Has chronic diarrhea    The history is provided by the patient. No language interpreter was used.     Physical Exam     Vitals signs and nursing note reviewed:  Vitals:    07/25/23 1647 07/25/23 1648 07/25/23 1709 07/25/23 1823   BP: 117/80   111/73   Pulse: (!) 123  (!) 113 100   Resp: 17      Temp: 98.4 F (36.9 C)      SpO2: 100%   100%   Weight:  61.2 kg (135 lb)     Height:  1.651 m (5\' 5" )        Physical Exam  Vitals and nursing note reviewed.   Constitutional:       General: She is not in acute distress.     Appearance: Normal appearance. She is well-developed. She is not ill-appearing, toxic-appearing or diaphoretic.      Comments: Well appearing   HENT:      Head: Atraumatic.      Nose: Nose normal.      Mouth/Throat:      Mouth: Mucous membranes are moist.      Pharynx: Oropharynx is clear.   Eyes:      Extraocular Movements:  Extraocular movements intact.      Conjunctiva/sclera: Conjunctivae normal.   Cardiovascular:      Rate and Rhythm: Tachycardia present.      Pulses: Normal  pulses.   Pulmonary:      Effort: Pulmonary effort is normal.   Abdominal:      General: Bowel sounds are normal.      Palpations: Abdomen is soft.      Tenderness: There is abdominal tenderness (mild ttp around j peg site without gaurding or rebound.). There is no guarding or rebound.   Musculoskeletal:         General: Normal range of motion.      Cervical back: Normal range of motion.   Skin:     General: Skin is warm and dry.      Capillary Refill: Capillary refill takes less than 2 seconds.      Coloration: Skin is not jaundiced or pale.      Findings: No bruising, erythema, lesion or rash.      Comments: No erythema or induration around j tube site   Neurological:      General: No focal deficit present.      Mental Status: She is alert and oriented to person, place, and time.   Psychiatric:         Mood and Affect: Mood normal.         Behavior: Behavior normal.         Thought Content: Thought content normal.         Judgment: Judgment normal.          Procedures     Procedures    Orders placed during this emergency department visit:     Orders Placed This Encounter   Procedures    XR ABDOMEN (KUB) (SINGLE AP VIEW)        Medications given during this emergency department visit:     Medications   HYDROcodone-acetaminophen (NORCO) 5-325 MG per tablet 1 tablet (1 tablet Oral Given 07/25/23 1733)       New prescriptions:     Discharge Medication List as of 07/25/2023  6:17 PM           Past History and Complexity:     Past Medical History:   Diagnosis Date    ADHD     managed with meds    Anxiety     Autism     Chronic constipation     GERD (gastroesophageal reflux disease)     Weight loss         Past Surgical History:   Procedure Laterality Date    CHOLECYSTECTOMY, LAPAROSCOPIC N/A 10/05/2020    CHOLECYSTECTOMY LAPAROSCOPIC performed by Adalberto Ill, MD  at [facility] MAIN OR    COLONOSCOPY N/A 09/17/2020    COLONOSCOPY/BMI 21 performed by Luanna Cole, MD at [facility] ENDOSCOPY    COLONOSCOPY N/A 01/14/2019    COLONOSCOPY/  BMI 21 PT IS AUTISTIC performed by Luanna Cole, MD at [facility] ENDOSCOPY    EAR SURGERY Bilateral     as a baby    LAPAROSCOPIC APPENDECTOMY N/A 10/05/2020    APPENDECTOMY LAPAROSCOPIC performed by Adalberto Ill, MD at [facility] MAIN OR    UPPER GASTROINTESTINAL ENDOSCOPY      UPPER GASTROINTESTINAL ENDOSCOPY N/A 01/08/2023    ESOPHAGOGASTRODUODENOSCOPY-Gastro Jejunal TUBE INSERTION performed by Archie Patten, MD at [facility] ENDOSCOPY        Social History     Socioeconomic History    Marital status: Single   Tobacco Use    Smoking status: Never    Smokeless tobacco: Never   Substance and Sexual Activity    Alcohol use: Not  Currently    Drug use: Never     Social Drivers of Psychologist, prison and probation services Strain: Low Risk  (07/22/2023)    Received from [facility]    Financial Resource Strain     Difficulty Paying Living Expenses: Not hard at all     Difficulty Paying Medical Expenses: No   Food Insecurity: No Food Insecurity (07/22/2023)    Received from [facility]    Food Insecurity     Worried about Running Out of Food in the Last Year: Never true     Ran Out of Food in the Last Year: Never true   Transportation Needs: No Transportation Needs (07/22/2023)    Received from [facility] Needs     Lack of Transportation: No   Physical Activity: Inactive (01/19/2023)    Received from [facility]    Physical Activity     Days of Exercise per Week: 0 days     On average, how many minutes do you exercise per day?: 0     Total Minutes of Exercise Per Week: 0   Stress: Stress Concern Present (07/22/2023)    Received from [facility]    Stress     Feeling of Stress : To some extent   Social Connections: Socially Integrated (07/22/2023)    Received from [facility]    Social Connections     Frequency of Communication with Friends and Family:  More than three times a week     Frequency of Social Gatherings with Friends and Family: More than three times a week   Intimate Partner Violence: Not At Risk (07/22/2023)    Received from [facility]    Intimate Partner Violence     Fear of Current or Ex-Partner: No     Emotionally Abused: No     Physically Abused: No     Sexually Abused: No   Housing Stability: Not At Risk (07/22/2023)    Received from [facility] Stability     Was there a time when you did not have a steady place to sleep: No     Worried that the place you are staying is making you sick: No        Discharge Medication List as of 07/25/2023  6:17 PM        CONTINUE these medications which have NOT CHANGED    Details   busPIRone (BUSPAR) 10 MG tablet Take 1 tablet by mouth 2 times dailyHistorical Med      Cholecalciferol (VITAMIN D3) 125 MCG (5000 UT) TABS Take 125 tablets by mouth dailyHistorical Med      Tenapanor HCl (IBSRELA) 50 MG TABS Take 50 tablets by mouth 2 times dailyHistorical Med      bisacodyl (DULCOLAX) 10 MG suppository Place 1 suppository rectally dailyHistorical Med      Nutritional Supplements (COMPLEAT ORIGINAL PLANT-BASED) LIQD by Enteral routeHistorical Med      linaclotide (LINZESS) 290 MCG CAPS capsule Take 1 capsule by mouth every morning (before breakfast)Historical Med      buPROPion (WELLBUTRIN XL) 150 MG extended release tablet Take 1 tablet by mouth every morningHistorical Med      QUEtiapine (SEROQUEL XR) 200 MG extended release tablet Take 1 tablet by mouth nightly Patient taking 100 mgHistorical Med      omalizumab (XOLAIR) 300 MG/2  ML SOSY injection Inject 600 mg into the skin every 28 daysHistorical Med  traZODone (DESYREL) 100 MG tablet Take 1 tablet by mouth nightlyHistorical Med      promethazine (PHENERGAN) 25 MG tablet Take 1 tablet by mouth every 6 hours as needed for NauseaHistorical Med      prochlorperazine (COMPAZINE) 10 MG tablet Take 1 tablet by mouth every 6 hours as neededHistorical  Med      guanFACINE (INTUNIV) 4 MG TB24 extended release tablet Take 1 tablet by mouth dailyHistorical Med      Prucalopride Succinate (MOTEGRITY) 2 MG TABS Take 1 tablet by mouth dailyHistorical Med      ondansetron (ZOFRAN) 4 MG tablet Take 1 tablet by mouth every 8 hours as needed for Nausea or Vomiting, Disp-90 tablet, R-2Normal      EPINEPHrine 0.3 MG/0.3ML SOSY Inject 0.3 mLs into the muscle 1 (one) time if needed (allergic reaction)Historical Med      levonorgestrel (MIRENA, 52 MG,) IUD 52 mg 1 each by IntraUTERine route onceHistorical Med              Results from this emergency department visit:      Results for orders placed or performed during the hospital encounter of 07/25/23   XR ABDOMEN (KUB) (SINGLE AP VIEW)    Narrative    KUB    INDICATION:   J-tube that is coming out    COMPARISON: X-rays of the abdomen dated 03/13/2023    TECHNIQUE: Supine views of the abdomen were obtained.    FINDINGS:  There is a normal bowel gas pattern. There is no evidence of  obstruction.  No renal calculi are seen. Lung bases are clear. Cholecystectomy  clips. Percutaneous gastrostomy tube with no intraluminal component seen.      Impression    Percutaneous gastrostomy tube with no intraluminal component seen.    Electronically signed by Annye English         XR ABDOMEN (KUB) (SINGLE AP VIEW)   Final Result   Percutaneous gastrostomy tube with no intraluminal component seen.      Electronically signed by Annye English                   No results for input(s): "COVID19" in the last 72 hours.     Voice dictation software was used during the making of this note.  This software is not perfect and grammatical and other typographical errors may be present.  This note has not been completely proofread for errors.       Shearon Stalls, [place]  07/25/23 [phone]

## 2023-08-06 NOTE — Care Coordination-Inpatient (Signed)
 Ambulatory Care Coordination Note     08/06/2023 3:43 PM     Parent outreach attempt by this ACM today to perform care management follow up . ACM was unable to reach the parent by telephone today;   left voice message requesting a return phone call to this ACM.     ACM: Dyann Kief, RN     Care Summary Note: Telephonic outreach attempt to follow up with mom. Left a discreet VM with a request for a return call. Will continue to outreach.     PCP/Specialist follow up:       Follow Up:   Plan for next ACM outreach in approximately 2 weeks to complete:  - goal progression  - education .

## 2023-08-10 LAB — HEMOGLOBIN A1C
Estimated Avg Glucose, External: 103 mg/dL
Hemoglobin A1C, External: 5.2 % (ref <5.7)

## 2023-08-11 MED FILL — XOLAIR 300MG/2ML SOSY: SUBCUTANEOUS | 28 days supply | Qty: 4 | Fill #1 | Status: AC

## 2023-08-12 LAB — MISCELLANEOUS SENDOUT

## 2023-08-19 NOTE — Care Coordination-Inpatient (Signed)
 Ambulatory Care Coordination Note     08/19/2023 4:37 PM     Patient Current Location:  [facility]     Parent contacted the [employer] by email.         ACM: Dyann Kief, RN     Challenges to be reviewed by the provider   Additional needs identified to be addressed with provider No  none               Method of communication with provider: none.    Utilization: Patient has not had any utilization since our last call.     Care Summary Note: Incoming email from the patient's mother.   She reports that the patient is doing well, maybe as good as she has for the last 2 1/2 years. The TPN is making a huge difference. The patient has an appointment at [facility] in [place] on May 30th, where long term options will be discussed.   Mom reports that she is no longer receiving calls from collections for the outstanding ambulance bill. 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     Offered patient enrollment in the Remote Patient Monitoring (RPM) program for in-home monitoring: Patient is not eligible for RPM program because: insurance coverage.     Assessments Completed:   No changes since last call    Medications Reviewed:   Not completed during this call: communication via email    Advance Care Planning:   Not reviewed during this call     Care Planning:   Not completed during this call    PCP/Specialist follow up:       Follow Up:   Plan for next ACM outreach in approximately 3 weeks to complete:  - goal progression  - education .   Caregiver is agreeable to this plan.

## 2023-08-26 MED FILL — GUANFACINE HCL ER 4MG TB24: 4 4 MG | ORAL | 90 days supply | Qty: 90 | Fill #0 | Status: AC

## 2023-08-26 MED FILL — PROCHLORPERAZINE MALEATE 10MG TABS: 10 10 MG | ORAL | 90 days supply | Qty: 39 | Fill #1 | Status: AC

## 2023-08-26 MED FILL — VITAMIN D 50 MCG(2000 UT) TABS: 50 50 MCG (2000 UT) | ORAL | 90 days supply | Qty: 90 | Fill #1 | Status: AC

## 2023-09-01 MED FILL — QUETIAPINE FUMARATE 100MG TABS: 100 100 MG | ORAL | 90 days supply | Qty: 90 | Fill #0 | Status: AC

## 2023-09-08 MED FILL — XOLAIR 300MG/2ML SOSY: 300 300 MG/2ML | SUBCUTANEOUS | 28 days supply | Qty: 4 | Fill #2 | Status: AC

## 2023-09-14 NOTE — Care Coordination-Inpatient (Signed)
 Ambulatory Care Coordination Note     09/14/2023 2:49 PM     Patient Current Location:  [place]      Email sent to patient's mother, Cathleen Coach.         ACM: Faith Homes, RN     Challenges to be reviewed by the provider   Additional needs identified to be addressed with provider No  none               Method of communication with provider: none.    Utilization: Patient has not had any utilization since our last call.     Care Summary Note: Email sent to patient's mother, Cathleen Coach to follow up regarding current status of the patient and care management needs.     Offered patient enrollment in the Remote Patient Monitoring (RPM) program for in-home monitoring: Patient is not eligible for RPM program because: insurance coverage.     Assessments Completed:   No changes since last call    Medications Reviewed:   Email communication, did not address medication at this time.     Advance Care Planning:   Not reviewed during this call     Care Planning:   Not completed during this call    PCP/Specialist follow up:       Follow Up:   Plan for next ACM outreach in approximately 1 week to complete:  - goal progression.   Caregiver is agreeable to this plan.

## 2023-10-06 MED FILL — XOLAIR 300MG/2ML SOSY: 300 300 MG/2ML | SUBCUTANEOUS | 28 days supply | Qty: 4 | Fill #3 | Status: AC

## 2023-10-14 NOTE — Care Coordination-Inpatient (Signed)
 Ambulatory Care Coordination Note     10/14/2023 3:31 PM     Patient Current Location:  [place]      Email sent to patient's mother to address insurance concerns.      ACM: Faith Homes, RN     Challenges to be reviewed by the provider   Additional needs identified to be addressed with provider No                 Method of communication with provider: none.    Utilization: Patient has not had any utilization since our last call.     Care Summary Note: Email sent to the patient's mother, Robin Mendoza to assess needs regarding insurance coverage. Will await a response, will address, and will discuss graduation from [facility].          Follow Up:   Plan for next ACM outreach in approximately 3 weeks to complete:  - advance care planning   - goal progression.   Caregiver is agreeable to this plan.

## 2023-10-15 MED FILL — BUPROPION HCL ER (XL) 150MG TB24: 150 150 MG | ORAL | 90 days supply | Qty: 90 | Fill #0 | Status: AC

## 2023-10-15 MED FILL — TRAZODONE HCL 100MG TABS: 100 100 MG | ORAL | 90 days supply | Qty: 90 | Fill #0 | Status: AC

## 2023-11-03 MED FILL — XOLAIR 300MG/2ML SOSY: 300 300 MG/2ML | SUBCUTANEOUS | 28 days supply | Qty: 4 | Fill #4 | Status: AC

## 2023-11-10 MED FILL — PROMETHAZINE HCL 25MG TABS: 25 25 MG | ORAL | 90 days supply | Qty: 180 | Fill #1 | Status: AC

## 2023-11-10 MED FILL — ONDANSETRON HCL 4MG TABS: 4 4 MG | ORAL | 90 days supply | Qty: 450 | Fill #1 | Status: AC

## 2023-11-11 NOTE — Care Coordination-Inpatient (Signed)
 Ambulatory Care Coordination Note     11/11/2023 3:49 PM     Patient Current Location:        Incoming email and response.      ACM: [name], RN     Challenges to be reviewed by the provider   Additional needs identified to be addressed with provider No                 Method of communication with provider: none.    Utilization: Patient has not had any utilization since our last call.     Care Summary Note: Incoming email from the patient's mother, requesting assistance regarding some insurance questions. She states that she is confused. Stating that she has an [employer] education [employer] card, but an [employer] account. Her member ID, is not completely active.   This ACM responded with the following:  The [employer] account is a part of [employer]. Your card says [employer], but I am seeing you with a fully active account for your family under that ID number. Who informed you that the account was not completely active?    Will await a response and work with the insurance to further investigate concern.

## 2023-11-16 NOTE — Care Coordination-Inpatient (Signed)
 Ambulatory Care Coordination Note     11/16/2023 3:18 PM     Parent outreach attempt by this ACM today to perform care management follow up . ACM was unable to reach the parent by telephone today;   left voice message requesting a return phone call to this ACM.     ACM: [name], RN     Care Summary Note: Telephonic outreach attempt to reach the patient's mother, Clotilda. This ACM was returning the call from [facility]. Left a discreet VM. Mom then sent this ACM an email, stating that she was on the phone with the insurance company. Will follow up to assess needs.     PCP/Specialist follow up:       Follow Up:   Plan for next ACM outreach in approximately 1 week to complete:  - goal progression  - education .

## 2023-11-20 MED FILL — PRUCALOPRIDE SUCCINATE 2MG TABS: 2 2 MG | ORAL | 90 days supply | Qty: 90 | Fill #0 | Status: AC

## 2023-11-24 MED FILL — VITAMIN D 50 MCG(2000 UT) TABS: 50 50 MCG (2000 UT) | ORAL | 90 days supply | Qty: 90 | Fill #2 | Status: AC

## 2023-11-24 MED FILL — GUANFACINE HCL ER 4MG TB24: 4 4 MG | ORAL | 90 days supply | Qty: 90 | Fill #0 | Status: AC

## 2023-11-24 MED FILL — QUETIAPINE FUMARATE 100MG TABS: 100 100 MG | ORAL | 90 days supply | Qty: 90 | Fill #0 | Status: AC

## 2023-11-30 NOTE — Care Coordination-Inpatient (Signed)
 Ambulatory Care Coordination Note     11/30/2023 1:51 PM     Email sent to the patient's mother to inquire regarding additional needs. This ACM responding the mom's recent email. Will continue to outreach.

## 2023-12-01 MED FILL — XOLAIR 300MG/2ML SOSY: 300 300 MG/2ML | SUBCUTANEOUS | 28 days supply | Qty: 4 | Fill #5 | Status: AC

## 2023-12-22 NOTE — Care Coordination-Inpatient (Signed)
 Ambulatory Care Coordination Note     12/22/2023 3:13 PM     Email sent to the patient's mother, Clotilda to follow up on recent inquiries regarding insurance claims. Will await a response.

## 2023-12-29 MED FILL — XOLAIR 300MG/2ML SOSY: 300 300 MG/2ML | SUBCUTANEOUS | 28 days supply | Qty: 4 | Fill #0 | Status: AC

## 2023-12-29 NOTE — Care Coordination-Inpatient (Signed)
 Ambulatory Care Coordination Note     12/29/2023 2:39 PM     Patient Current Location:  [place]      Parent contacted the ACM by telephone. Verified name and DOB with parent as identifiers.         ACM: [name], RN     Challenges to be reviewed by the provider   Additional needs identified to be addressed with provider No                 Method of communication with provider: none.    Utilization: Patient has not had any utilization since our last call.     Care Summary Note: Incoming call from the patient's mother. She voiced that she received an EOB informing her that none of the patient's infusions or PICC line dressings are being covered by the insurance. This ACM requested that mom send a copy of the EOB to be reviewed, via email. Mom is also concerned that the patient has an upcoming gastric emptying study with [facility], that she is worried may not be covered. It is scheduled for 9/3-9/5.   This ACM will follow up and submit a waiver if needed. It will be completed at the [facility] clinic in [place] by Dr. Redell Cable.     Offered patient enrollment in the Remote Patient Monitoring (RPM) program for in-home monitoring: Patient is not eligible for RPM program because: insurance coverage.     Assessments Completed:   No changes since last call    Medications Reviewed:   Patient denies any changes with medications and reports taking all medications as prescribed.    Advance Care Planning:   Not reviewed during this call     Care Planning:    Goals Addressed                   This Visit's Progress     Attends follow up appointments on schedule   On track     Patient will adhere to scheduled appointments and medications.        Self Monitoring   On track     Daily Weights - I will weight myself as directed - Daily and write down weights    Patient Reported Weight        No data to display                Barriers: overwhelmed by complexity of regimen and stress  Plan for overcoming my barriers: therapy and  family support  Confidence: 5/10  Anticipated Goal Completion Date: 04/12/2023                 PCP/Specialist follow up:       Follow Up:   Plan for next ACM outreach in approximately 1-2 days  to complete:  - education   - possible waiver completion.   Caregiver is agreeable to this plan.

## 2024-01-01 NOTE — Care Coordination-Inpatient (Signed)
 Ambulatory Care Coordination Note     01/01/2024 12:21 PM     Patient Current Location:  [place]      ACM contacted the parent by telephone. Verified name and DOB with parent as identifiers.         ACM: [name], RN     Challenges to be reviewed by the provider   Additional needs identified to be addressed with provider No                 Method of communication with provider: none.    Utilization: Patient has not had any utilization since our last call.     Care Summary Note: Telephonic outreach to clarify the name of the provider at [facility], to send the waiver information to. This ACM completed the waiver for Dr. Redell Cable at [facility]. Waiver sent to Hoy Ogles for clinical and signature. Requested the waiver be sent to [employer] after clinical added.   Will follow up on the waiver and assess progress moving forward. Patient has a scheduled appointment for gastric emptying study on September 3-5.     Offered patient enrollment in the Remote Patient Monitoring (RPM) program for in-home monitoring: Patient is not eligible for RPM program because: insurance coverage.     Assessments Completed:   No changes since last call    Medications Reviewed:   Completed during a previous call     Advance Care Planning:   Not reviewed during this call     Care Planning:   Not completed during this call    PCP/Specialist follow up:       Follow Up:   Plan for next ACM outreach in approximately 1 week to complete:  - follow up on waiver.   Caregiver is agreeable to this plan.

## 2024-01-07 NOTE — Care Coordination-Inpatient (Signed)
 Ambulatory Care Coordination Note     01/07/2024 2:13 PM     Provider outreach attempt by this ACM today to perform care management follow up . ACM was unable to reach the provider by telephone today;   left voice message requesting a return phone call to this ACM.     ACM: [name], RN     Care Summary Note: Telephonic outreach to the [facility] office to determine whether the waiver that was sent has been completed and submitted to [employer]. Left a discreet VM. This ACM also sent an email to the patient's mother to see if she has had any contact with Meredith's office. Will await a return call and continue attempts to get waiver submitted.     PCP/Specialist follow up:       Follow Up:   Plan for next ACM outreach in approximately 1-2 days  to complete:  - goal progression  - education .

## 2024-01-08 NOTE — Care Coordination-Inpatient (Signed)
 Ambulatory Care Coordination Note     01/08/2024 1:36 PM     Patient Current Location:  [place]      Parent contacted the ACM by telephone. Verified name and DOB with parent as identifiers.         ACM: [name], RN     Challenges to be reviewed by the provider   Additional needs identified to be addressed with provider No                 Method of communication with provider: none.    Utilization: Patient has not had any utilization since our last call.     Care Summary Note: Incoming call from the patient's mother. She informed this ACM that the patient has not been following through with her TPN. Mom states that the patient has been cutting a hole in the TPN and then throwing them away. Mom states that she is very frustrated regarding his behavior. She has asked the patient to leave her house. The patient is currently staying with mom's best friend and her father is very involved. Mom also states that the patient has left notes talking about her frustrations, depression that nothing is getting better. She is seeing a therapist and a psychiatrist. Will continue to outreach and work with mom in the best interest of the patient.     Offered patient enrollment in the Remote Patient Monitoring (RPM) program for in-home monitoring: Patient is not eligible for RPM program because: insurance coverage.     Assessments Completed:   No changes since last call    Medications Reviewed:   Patient denies any changes with medications and reports taking all medications as prescribed.    Advance Care Planning:   Not reviewed during this call     Care Planning:   Not completed during this call    PCP/Specialist follow up:       Follow Up:   Plan for next ACM outreach in approximately 1 week to complete:  - goal progression  - education .   Caregiver is agreeable to this plan.

## 2024-01-15 NOTE — Progress Notes (Signed)
 Psychiatry Consult Follow Up    [facility]  [address]   [place], [place] [address]     Patient Name: Robin Mendoza  Admission Date: 01/08/2024  Treatment Team:   MEDICINE TEACHING SERVICE 1  Date of Initial Consult: 01/12/2024  Reason for Consult: Suicidal ideation    SUBJECTIVE    Identifying Data:  Robin Mendoza is a 23 y.o. female with a psychiatric history of ARFID, ASD, MDD, and GAD and PMH of TPN dependence, nondiabetic gastroparesis, normocytic anemia who was admitted to [facility] for refeeding syndrome. Psychiatry was consulted for suicidal ideation.     Interval History:   Her cousin Candance and sister were present for the interview. On exam, Robin Mendoza was sitting upright in bed listening to music. She was cooperative and answered all questions appropriately. She struggled with nausea/vomiting over night and an NG tube was placed today for management of ileus. She reports improved anxiety (GAD-7 score 13 down from 18) and recognized that the clonazepam helped manage her pre-procedure anxiety. She endorses thoughts of wanting to harm herself but was unable to elaborate on what those thoughts were. She denies HI and/or AVH.     Family mentioned their concern for apathy stating that although the clonazepam has removed her anxiety, she doesn't seem to be feeling much/any emotion. When asked if this bothered Robin Mendoza, she responded no.     Scheduled medications:  bisacodyL , 15 mg, Oral, TID  buPROPion , 150 mg, Oral, Daily  ceFAZolin , 2 g, Intravenous, Q8H  clonazePAM, 0.25 mg, Oral/Gastric Tube, BID  DULoxetine, 30 mg, Oral, Daily  enoxaparin  (LOVENOX ) injection for prophylaxis, 40 mg, Subcutaneous, Daily  flu vaccine trivalent 6 mos+ PF (2025-26), 0.5 mL, Intramuscular, Once  guanFACINE , 4 mg, Oral, Daily  insulin lispro, 1-5 Units, Subcutaneous, TID (08, 1200, & 1700)   And  insulin lispro, 0-4 Units, Subcutaneous, Bedtime  [START ON 01/16/2024]  levothyroxine, 12.5 mcg, Oral/Gastric Tube, Daily  multivitamin-minerals, 1 tablet, Oral/Gastric Tube, Daily  promethazine , 50 mg, Oral, Bedtime  prucalopride, 2 mg, Oral, Daily        PRN medications:  .  acetaminophen   .  [COMPLETED] Adult Hypoglycemic Standing Orders for Inpatient and Procedural Areas **AND** Glucose by Meter-POC **AND** dextrose  **AND** dextrose  IV bolus **AND** glucagon  (GLUCAGEN) injection  .  [EXPIRED] Adult Hypoglycemic Standing Orders for Inpatient and Procedural Areas **AND** Glucose by Meter-POC **AND** dextrose  **AND** dextrose  IV bolus **AND** glucagon  (GLUCAGEN) injection  .  sodium chloride  **OR** dextrose   .  LORazepam   .  melatonin  .  ondansetron   .  ondansetron   .  polyethylene glycol  .  prochlorperazine   .  promethazine     Review of systems:  Neurologic: Denies headache and vision change.  Respiratory: Denies cough, SOB, wheezing and chest pain.   Gastrointestinal: Endorses nausea, vomiting and denies diarrhea.  Musculoskeletal: Denies joint pain and swelling.    OBJECTIVE    Vitals:  Vitals:    01/15/24 1127   BP:    Pulse: 91   Resp: 16   Temp:    SpO2: 98%      Physical Examination:   General: Patient was sitting upright and conversational, in no apparent distress.   HEENT: Normocephalic, atraumatic.   Respiratory: No obvious external abnormalities of the chest. No increased WOB.   Cardiac: No obvious LE edema.   Abdominal: No obvious external deformities of the abdomen.  Neurological: Patient oriented to person, place and time with normal affect and they  are able to move all extremities.  For a more thorough physical exam, please see primary team's progress notes.    Mental Status Examination:   Appearance: well-groomed; thin  Behavior: cooperative and good eye contact  Speech: normal rate, tone and rhythm  Mood: Not good  Affect: blunted likely due to her autism spectrum disorder  Thought Process:  linear, organized, and goal directed.  Thought Content: Denies AVH. No  evidence of delusions.  Suicidality: Endorses thoughts of wanting to hurt herself (unable to elaborate)  Homicidality: Denies  Insight: poor because she appears to expresses/experience SI with any discomfort (rather than recognizing/understanding the cause of her discomfort is her ARFID/sepsis/ASD)  Judgement: poor because she appears to expresses SI with any discomfort (rather than attempt to mitigate/tolerate the discomfort)  Sensorium: Patient awake, alert; oriented to person, place, time and situation  Memory: Recent memory appears improved from yesterday as she does remember talking with us  yesterday; Remote memory intact  Fund of knowledge: Appropriate    Labs Reviewed:  Please note labs have been reviewed and any abnormalities are being managed by the primary team.  Recent Labs       Procedure Component Value Units Date/Time    XR Abdomen AP - In process [[id number]] Resulted: 01/15/24 1253     Updated: 01/15/24 1253    This result has not been signed. Information might be incomplete.      Glucose by Meter-POC [[id number]]  (Normal) Collected: 01/15/24 1128    Specimen: Whole Blood Updated: 01/15/24 1128     Glucose, Nursing POC 96 mg/dL     Narrative:      Fasting Glucose:  Impaired Fasting 100 - 125 mg/dL  Diagnostic of Diabetes Mellitus >/= 126 mg/dL    Random Glucose:  Reference: 70 - 139 mg/dL  Diagnostic of Diabetes Mellitus >/= 200 mg/dL    Blood Culture, Routine [[id number]] Collected: 01/11/24 0953    Specimen: Blood from Hand, Left Updated: 01/15/24 1030     Blood Culture, Routine No growth after 4 days    Comprehensive Metabolic Panel (CMP) [[id number]]  (Abnormal) Collected: 01/15/24 0810    Specimen: Blood Updated: 01/15/24 0952     Sodium 142 mmol/L      Potassium 4.0 mmol/L      Chloride 109* mmol/L      CO2 25 mmol/L      Anion Gap 8 mmol/L      BUN 5* mg/dL      Creatinine 9.38 mg/dL      Glucose 87 mg/dL      Calcium 9.3 mg/dL      AST 30 IU/L      ALT 18 IU/L      Alkaline Phosphatase 92 IU/L       Protein, Total 5.7* g/dL      Albumin 2.5* g/dL      Bilirubin, Total <9.7 mg/dL      eGFR >09 fO/fpw/8.26 m2     Narrative:      Fasting Glucose:  Impaired Fasting 100 - 125 mg/dL  Diagnostic of Diabetes Mellitus >/= 126 mg/dL    Random Glucose:  Reference: 70 - 139 mg/dL  Diagnostic of Diabetes Mellitus >/= 200 mg/dL    Phosphorus [[id number]]  (Normal) Collected: 01/15/24 0810    Specimen: Blood Updated: 01/15/24 0945     Phosphorus 4.2 mg/dL     Free T4 [[id number]]  (Abnormal) Collected: 01/15/24 0810    Specimen: Blood Updated: 01/15/24 0945     T4,  Free 0.90* ng/dL     TSH (Thyroid Stimulating Hormone) [[id number]]  (Normal) Collected: 01/15/24 0810    Specimen: Blood Updated: 01/15/24 0945     TSH 2.500 uIU/mL     Magnesium  (Mg) [[id number]]  (Normal) Collected: 01/15/24 0810    Specimen: Blood Updated: 01/15/24 0945     Magnesium  2.1 mg/dL     Blood Culture, Routine [[id number]]  (Abnormal) Collected: 01/11/24 1038    Specimen: Blood from [place] Updated: 01/15/24 0743     Blood Culture, Routine Streptococcus mitis / Streptococcus oralis*      Staphylococcus epidermidis*    Glucose by Meter-POC [[id number]]  (Abnormal) Collected: 01/15/24 0740    Specimen: Whole Blood Updated: 01/15/24 0740     Glucose, Nursing POC 101* mg/dL     Narrative:      Fasting Glucose:  Impaired Fasting 100 - 125 mg/dL  Diagnostic of Diabetes Mellitus >/= 126 mg/dL    Random Glucose:  Reference: 70 - 139 mg/dL  Diagnostic of Diabetes Mellitus >/= 200 mg/dL    CBC w/Differential [[id number]]  (Abnormal) Collected: 01/15/24 0444    Specimen: Whole Blood Updated: 01/15/24 0518     WBC 5.1 K/uL      RBC 3.72* M/uL      Hemoglobin 10.0* g/dL      Hematocrit 69.2* %      MCV 82.5 fL      MCH 26.9 pg      MCHC 32.6 g/dL      RDW-CV 85.6 %      Platelets 216 K/uL      MPV 10.8 fL      Neutrophils, % 50.5 %      Lymphocytes, % 34.4 %      Monocytes, % 10.0 %      Eosinophils % 4.3 %      Basophils % 0.4 %      Neutrophils, Abs 2.59 K/uL       Lymphocytes, Abs 1.76 K/uL      Monocytes, Abs 0.51 K/uL      Eosinophils, Abs 0.22 K/uL      Basophils, Abs <0.03 K/uL      Immature Granulocytes, % 0.4 %      Immature Granulocytes, Abs 0.02 K/uL      nRBC Percent 0 %      nRBC Abs 0.0 K/uL      RDW-SD 43.4 fL     Glucose by Meter-POC [[id number]]  (Abnormal) Collected: 01/15/24 0500    Specimen: Whole Blood Updated: 01/15/24 0501     Glucose, Nursing POC 256* mg/dL     Narrative:      Fasting Glucose:  Impaired Fasting 100 - 125 mg/dL  Diagnostic of Diabetes Mellitus >/= 126 mg/dL    Random Glucose:  Reference: 70 - 139 mg/dL  Diagnostic of Diabetes Mellitus >/= 200 mg/dL    Glucose by Meter-POC [[id number]]  (Abnormal) Collected: 01/15/24 0132    Specimen: Whole Blood Updated: 01/15/24 0133     Glucose, Nursing POC 113* mg/dL     Narrative:      Fasting Glucose:  Impaired Fasting 100 - 125 mg/dL  Diagnostic of Diabetes Mellitus >/= 126 mg/dL    Random Glucose:  Reference: 70 - 139 mg/dL  Diagnostic of Diabetes Mellitus >/= 200 mg/dL    XR Chest 1 Vw [[id number]] Collected: 01/14/24 2305     Updated: 01/14/24 2309    Narrative:        EXAM: XR CHEST 1  VW, 01/14/2024 10:30 PM     INDICATION: ;NG placement confirmation;      COMPARISON: 01/13/2024      TECHNIQUE: AP chest    FINDINGS:  Stable left PICC line with tip terminating near the superior cavoatrial junction.   Enteric tube tip and side-port project over the left upper quadrant and is likely in the stomach.  Heart size is normal. No pleural effusion or pneumothorax. No focal consolidation. Partially visualized distended loops of bowel the left upper quadrant Unchanged osseous structures.      Impression:        Enteric tube tip projects over the left upper quadrant and is likely in the stomach. .    Signed by: 01/14/2024 11:07 PM: Kenn MOULD, MD, B.F.          Glucose by Meter-POC [[id number]]  (Abnormal) Collected: 01/14/24 2218    Specimen: Whole Blood Updated: 01/14/24 2219     Glucose, Nursing POC 102* mg/dL      Narrative:      Fasting Glucose:  Impaired Fasting 100 - 125 mg/dL  Diagnostic of Diabetes Mellitus >/= 126 mg/dL    Random Glucose:  Reference: 70 - 139 mg/dL  Diagnostic of Diabetes Mellitus >/= 200 mg/dL    Comprehensive Metabolic Panel (CMP) [[id number]]  (Abnormal) Collected: 01/14/24 2104    Specimen: Blood Updated: 01/14/24 2153     Sodium 144 mmol/L      Potassium 4.1 mmol/L      Chloride 109* mmol/L      CO2 26 mmol/L      Anion Gap 9 mmol/L      BUN 3* mg/dL      Creatinine 9.39 mg/dL      Glucose 894* mg/dL      Calcium 9.0 mg/dL      AST 38* IU/L      ALT 19 IU/L      Alkaline Phosphatase 95 IU/L      Protein, Total 5.8* g/dL      Albumin 2.6* g/dL      Bilirubin, Total <9.7 mg/dL      eGFR >09 fO/fpw/8.26 m2     Narrative:      Fasting Glucose:  Impaired Fasting 100 - 125 mg/dL  Diagnostic of Diabetes Mellitus >/= 126 mg/dL    Random Glucose:  Reference: 70 - 139 mg/dL  Diagnostic of Diabetes Mellitus >/= 200 mg/dL    Magnesium  (Mg) [[id number]]  (Normal) Collected: 01/14/24 2104    Specimen: Blood Updated: 01/14/24 2151     Magnesium  1.8 mg/dL     Phosphorus [[id number]]  (Normal) Collected: 01/14/24 2104    Specimen: Blood Updated: 01/14/24 2151     Phosphorus 3.6 mg/dL     Transesophageal Echocardiogram (TEE) (w/wo Contrast Per Protocol) [[id number]] Resulted: 01/14/24 1800     Updated: 01/14/24 1800     BP 124/86 mmHg      BSA 1.5 m2      Weight 1,743 oz      Height (In) 65 in      Weight 109 lb      TR Vmax 211.7 cm/s      TR Peak Gradient 17.9 mmHg      Ao root diameter 2.27 cm      Ascending aorta 1.98 cm     Narrative:      .  Normal left ventricle cavity size.  .  The left ventricular systolic function is normal (55-65%).  .  The right ventricular systolic function  is normal.  .  There is no thrombus present in the left atrium. There is no thrombus   in the left atrial appendage.  .  Mild tricuspid valve regurgitation.    No obvious vegetations.      XR Abdomen AP [[id number]] Collected:  01/14/24 1659     Updated: 01/14/24 1704    Narrative:        EXAM: XR ABDOMEN AP, 01/14/2024 4:21 PM     INDICATION: ;Constipation, no bowel movement in 9 days;     Comparison: 05/26/2023      FINDINGS:    AP view abdomen-pelvis demonstrates gaseous filling of bowel loops in the mid to upper abdomen with gas throughout the upper colon. Metallic clips seen in the right upper quadrant.    IUD projects over the central anatomic pelvis.    Only a small amount of scattered stool is present in the colon.    Lung bases are grossly clear.      CONCLUSION:    Substantial interval increase in volume of bowel gas within both large and small bowel. Findings could reflect underlying ileus versus distal obstruction. Mild stool is present within the colon without prominent focal stool burden.    Signed by: 01/14/2024 5:02 PM: Zackary MD, HILARIO Rigg          Blood Culture, Routine [[id number]] Collected: 01/13/24 1341    Specimen: Blood from Hand, Left Updated: 01/14/24 1500     Blood Culture, Routine No growth after 1 day    Blood Culture, Routine [[id number]] Collected: 01/13/24 1343    Specimen: Blood from Wrist, Left Updated: 01/14/24 1500     Blood Culture, Routine No growth after 1 day    Glucose by Meter-POC [[id number]]  (Abnormal) Collected: 01/14/24 1234    Specimen: Whole Blood Updated: 01/14/24 1238     Glucose, Nursing POC 119* mg/dL     Narrative:      Fasting Glucose:  Impaired Fasting 100 - 125 mg/dL  Diagnostic of Diabetes Mellitus >/= 126 mg/dL    Random Glucose:  Reference: 70 - 139 mg/dL  Diagnostic of Diabetes Mellitus >/= 200 mg/dL    Surgical Pathology Exam - Arm, Right [[id number]] Collected: 01/13/24 1744    Specimen: Tissue from Arm, Right Updated: 01/14/24 1228    Comprehensive Metabolic Panel (CMP) [[id number]]  (Abnormal) Collected: 01/14/24 1022    Specimen: Blood Updated: 01/14/24 1123     Sodium 140 mmol/L      Potassium 3.7 mmol/L      Chloride 105 mmol/L      CO2 27 mmol/L      Anion Gap 8 mmol/L      BUN  3* mg/dL      Creatinine 9.37 mg/dL      Glucose 877* mg/dL      Calcium 8.8 mg/dL      AST 23 IU/L      ALT 14 IU/L      Alkaline Phosphatase 87 IU/L      Protein, Total 5.6* g/dL      Albumin 2.5* g/dL      Bilirubin, Total 0.2 mg/dL      eGFR >09 fO/fpw/8.26 m2     Narrative:      Fasting Glucose:  Impaired Fasting 100 - 125 mg/dL  Diagnostic of Diabetes Mellitus >/= 126 mg/dL    Random Glucose:  Reference: 70 - 139 mg/dL  Diagnostic of Diabetes Mellitus >/= 200 mg/dL    Magnesium  (Mg) [[id number]]  (Normal)  Collected: 01/14/24 1022    Specimen: Blood Updated: 01/14/24 1123     Magnesium  1.8 mg/dL     Phosphorus [[id number]]  (Normal) Collected: 01/14/24 1022    Specimen: Blood Updated: 01/14/24 1123     Phosphorus 3.6 mg/dL     Triglycerides [[id number]]  (Normal) Collected: 01/14/24 1022    Specimen: Blood Updated: 01/14/24 1123     Triglycerides 105 mg/dL     CBC w/Differential [[id number]]  (Abnormal) Collected: 01/14/24 0106    Specimen: Whole Blood Updated: 01/14/24 0348     WBC 4.9 K/uL      RBC 3.81* M/uL      Hemoglobin 10.3* g/dL      Hematocrit 68.8* %      MCV 81.6 fL      MCH 27.0 pg      MCHC 33.1 g/dL      RDW-CV 85.5 %      Platelets 212 K/uL      MPV 11.0 fL      Neutrophils, % 37.7 %      Lymphocytes, % 43.4 %      Monocytes, % 13.0 %      Eosinophils % 5.3 %      Basophils % 0.4 %      Neutrophils, Abs 1.85 K/uL      Lymphocytes, Abs 2.13 K/uL      Monocytes, Abs 0.64 K/uL      Eosinophils, Abs 0.26 K/uL      Basophils, Abs <0.03 K/uL      Immature Granulocytes, % 0.2 %      Immature Granulocytes, Abs 0.01 K/uL      nRBC Percent 0 %      nRBC Abs 0.0 K/uL      RDW-SD 42.5 fL     Glucose by Meter-POC [[id number]]  (Abnormal) Collected: 01/14/24 0315    Specimen: Whole Blood Updated: 01/14/24 0315     Glucose, Nursing POC 178* mg/dL     Narrative:      Fasting Glucose:  Impaired Fasting 100 - 125 mg/dL  Diagnostic of Diabetes Mellitus >/= 126 mg/dL    Random Glucose:  Reference: 70 - 139  mg/dL  Diagnostic of Diabetes Mellitus >/= 200 mg/dL    Glucose by Meter-POC [[id number]]  (Abnormal) Collected: 01/14/24 0042    Specimen: Whole Blood Updated: 01/14/24 0043     Glucose, Nursing POC 146* mg/dL     Narrative:      Fasting Glucose:  Impaired Fasting 100 - 125 mg/dL  Diagnostic of Diabetes Mellitus >/= 126 mg/dL    Random Glucose:  Reference: 70 - 139 mg/dL  Diagnostic of Diabetes Mellitus >/= 200 mg/dL    Comprehensive Metabolic Panel (CMP) [[id number]]  (Abnormal) Collected: 01/13/24 1827    Specimen: Blood Updated: 01/13/24 1917     Sodium 139 mmol/L      Potassium 4.0 mmol/L      Chloride 108* mmol/L      CO2 26 mmol/L      Anion Gap 5* mmol/L      BUN 5* mg/dL      Creatinine 9.31 mg/dL      Glucose 841* mg/dL      Calcium 9.2 mg/dL      AST 30 IU/L      ALT 17 IU/L      Alkaline Phosphatase 93 IU/L      Protein, Total 5.8* g/dL      Albumin 2.6* g/dL  Bilirubin, Total 0.2 mg/dL      eGFR >09 fO/fpw/8.26 m2     Narrative:      Fasting Glucose:  Impaired Fasting 100 - 125 mg/dL  Diagnostic of Diabetes Mellitus >/= 126 mg/dL    Random Glucose:  Reference: 70 - 139 mg/dL  Diagnostic of Diabetes Mellitus >/= 200 mg/dL    Magnesium  (Mg) [[id number]]  (Normal) Collected: 01/13/24 1827    Specimen: Blood Updated: 01/13/24 1917     Magnesium  1.9 mg/dL     Phosphorus [[id number]]  (Normal) Collected: 01/13/24 1827    Specimen: Blood Updated: 01/13/24 1917     Phosphorus 4.2 mg/dL     XR Chest 1 Vw [[id number]] Collected: 01/13/24 1850     Updated: 01/13/24 1854    Narrative:        EXAM:  XR CHEST 1 VW     COMPARISON:  01/12/2024    INDICATION: ;dyspnea;;Assess PICC Placement    TECHNIQUE: Frontal view of the chest.    FINDINGS:  Interval placement of a left PICC line with tip terminating near the superior cavoatrial junction. Heart size is normal. No pleural effusion or pneumothorax. No focal consolidation. Partially visualized distended loops of bowel the left upper quadrant Unchanged osseous structures.       Impression:        Left PICC line tip terminating near the superior cavoatrial junction.    Interval removal of right PICC line.    Increased air-filled distended loops of bowel left upper quadrant.    Signed by: 01/13/2024 6:52 PM: Joesph ROSALEA Donnice Priscilla GLENWOOD [facility] (Vascular Access Nursing ONLY) [[id number]] Resulted: 01/13/24 1801     Updated: 01/13/24 1803    Narrative:      Burnetta Alita HERO, RN     01/13/2024  6:03 PM  Ultrasound - Point of Care (Vascular Access Nursing ONLY)    Date and Time:  01/13/2024 6:02 PM  Performed by: Burnetta Alita HERO, RN  Authorized by: Angelo Spray, MD        PICC Insertion  The risks, benefits, and alternatives were discussed with the decision   maker and written informed consent was obtained.  Maximum sterile barrier   was utilized.   PICC inserted in basilic vein using ultrasound guided   needle via Modified Seldinger Technique and tip locating system.  A   sterile dressing was placed.  The patient tolerated the procedure well.    Unable to educate patient at this time due to current cognitive status.   PICC tip located in SVC per [employer]   Instructed Primary RN to hang all new IV tubing before using PICC line.        Glucose by Meter-POC [[id number]]  (Abnormal) Collected: 01/13/24 1445    Specimen: Whole Blood Updated: 01/13/24 1728     Glucose, Nursing POC 164* mg/dL     Narrative:      Fasting Glucose:  Impaired Fasting 100 - 125 mg/dL  Diagnostic of Diabetes Mellitus >/= 126 mg/dL    Random Glucose:  Reference: 70 - 139 mg/dL  Diagnostic of Diabetes Mellitus >/= 200 mg/dL    Glucose by Meter-POC [[id number]]  (Abnormal) Collected: 01/13/24 1640    Specimen: Whole Blood Updated: 01/13/24 1728     Glucose, Nursing POC 177* mg/dL     Narrative:      Fasting Glucose:  Impaired Fasting 100 - 125 mg/dL  Diagnostic of Diabetes  Mellitus >/= 126 mg/dL    Random Glucose:  Reference: 70 - 139 mg/dL  Diagnostic of Diabetes Mellitus >/= 200 mg/dL    ECG 12  Lead [[id number]] Collected: 01/13/24 1707     Updated: 01/13/24 1721     Heart Rate 56 bpm      RR Interval 1,080 ms      Atrial Rate 55 ms      PR Interval 120 ms      P Wave Duration 81 ms      P Horizontal Axis 38 deg      P Front Axis 6 deg      Q Onset 508 ms      QRSD Interval 71 ms      QT Interval 390 ms      QTcB 375 ms      QTcF 381 ms      QRS Horizontal Axis 3 deg      QRS Axis 21 deg      I-40 Horizontal Axis 95 deg      I-40 Front Axis -50 deg      T-40 Horizontal Axis -14 deg      T-40 Front Axis 23 deg      T Horizontal Axis 1 deg      T Wave Axis 5 deg      S-T Horizontal Axis 24 deg      S-T Front Axis 34 deg     Narrative:      - NORMAL ECG -  Sinus rhythm  normal P axis, V-rate 50-99  When compared with ECG of 11-Jan-2024 08:45:26,  Significant rate decrease  rate less than previous (>20 bpm)  Confirmed by: Garrel Valrie Fairly      Comprehensive Metabolic Panel (CMP) [[id number]]  (Abnormal) Collected: 01/13/24 1341    Specimen: Blood Updated: 01/13/24 1453     Sodium 139 mmol/L      Potassium 4.6 mmol/L      Chloride 107 mmol/L      CO2 23 mmol/L      Anion Gap 9 mmol/L      BUN 5* mg/dL      Creatinine 9.37 mg/dL      Glucose 860* mg/dL      Calcium 8.9 mg/dL      AST 55* IU/L      ALT 20 IU/L      Alkaline Phosphatase 97 IU/L      Protein, Total 5.9* g/dL      Albumin 2.7* g/dL      Bilirubin, Total 0.2 mg/dL      eGFR >09 fO/fpw/8.26 m2     Narrative:      Fasting Glucose:  Impaired Fasting 100 - 125 mg/dL  Diagnostic of Diabetes Mellitus >/= 126 mg/dL    Random Glucose:  Reference: 70 - 139 mg/dL  Diagnostic of Diabetes Mellitus >/= 200 mg/dL    Magnesium  (Mg) [[id number]]  (Normal) Collected: 01/13/24 1341    Specimen: Blood Updated: 01/13/24 1453     Magnesium  1.9 mg/dL     Phosphorus [[id number]]  (Normal) Collected: 01/13/24 1341    Specimen: Blood Updated: 01/13/24 1453     Phosphorus 3.8 mg/dL             ASSESSMENT & PLAN  Keylee Shrestha is a 23 y.o. female with a psychiatric  history of ARFID, ASD, MDD, and GAD and PMH of TPN dependence, nondiabetic gastroparesis, normocytic anemia who was admitted to [facility]  for refeeding syndrome. Psychiatry was consulted for suicidal ideation.      1) Suicidal Ideation  During the interview, Robin Mendoza reported thoughts of wanting to hurt herself but was unable to elaborate on this further. Previously, her expressed SI was able to be reframed and redirected.  - Continue to monitor and 1:1 sitter  - Encouraged by her ability to be redirected and/or re-frame SI     2) Anxiety  Clinical improvement in anxious symptoms evidenced by (1) patient verbalizing decreased symptomology and (2) GAD-7 score today was 13 (down from 18/21). Will continue to monitor anxious symptoms with GAD-7.  - Recommend discontinue bupropion  (Wellbutrin  XL) 150 mg due to its relative contraindication in eating disorders and its side effect profile (including appetite suppression, abdominal pain, anxiety, anorexia). Robin Mendoza has has had an adequate trial (>6 months) without symptom reduction.   - Continue 0.25 mg clonazepam BID  - Continue duloxetine (Cymbalta) 30 mg QD schedule at bedtime to reduce nausea/vomiting  - Future considerations could include the addition of mirtazapine for appetite stimulation and mood support     Risk Assessment:  Patient's risk factors for suicide include: history of hospitalization, history of self-harm behaviors, and impulsivity.  Patient's protective factors from suicide include: future oriented and social supports.  Patient's acute risk of suicide is currently: MODERATE acute risk, due to multiple static and modifiable risk factors and few protective factors.   Risk of harm to others: Low due to no previous history of violence  Ability to care for self: Limited due to concurrent diagnoses (including but not limited to ARFID, sepsis, SI, MDD, ASD, and syncope)     The above assessment and plan was discussed with the attending physician,  Dr. Morene Ash, who agrees with this treatment plan. We appreciate the consult and will continue to follow daily.     Aleck Blush  Medical Student (MS4)  [facility], Department of Psychiatry & Behavioral Medicine    ATTESTATION NOTE:    This patient examined independent of the medical student. Key components of the history and physical were confirmed. I have reviewed the notes, assessments, and/or procedures performed by Aleck Blush, I concur with her/his documentation of the history, physical, assessment and plan in regards to ARFID and MDD.       Morene IVAR Ash MD  Psychiatry      *Some images could not be shown.

## 2024-01-15 NOTE — Progress Notes (Signed)
 -------------------------------------------------------------------------------  Attestation signed by Robin Spray, MD at 01/16/2024  9:02 AM  I have seen and evaluated the patient. Discussed with Dr Melonie and agree with the findings and plan as documented in the resident's note.    # Ileus/ARFID/MDD  > NGT to suction. Possibly underlying constipation. Discussed fleet enema with Ms Hollis which she was not open to but she'll think about it and let me know tomorrow. Break in suction for meds for at least 1hr and 30 mins after administration of po meds  > Unclear if hypothyroidism is playing a tole here. Ft4 - low. Start low dose levothyroxine 12.5mcg and follow-up with close monitoring as nutrition improves.   > Appreciate Dr Sueann input and nutrition - continue TPN and monitor electrolytes    #CoNS and VGS bacteremia   > Discordant TTE and TEE - possible TVIE on TTE but negative on TEE  > Right arm PICC was source  removed on 9/4,peripheral & repeat bcx negative, left arm PICC placed on 9/4 - plan to continue cefazolin  2grams q8hrs until discharge, prior to discharge switch to ceftriaxone for ease of dosing - do not think that if she truly has native valve endocarditis that CoNS is playing a role, VGS is  more likely -   > She'll need 6 weeks of therapy - EOT - 02/25/24    Possible allergic reaction to diary   > Had a milkshake and reportedly developed shortness of breath and a rash, assessed at bedside during a rapid called for this. No wheezing or stridor. No obvious skin eruption. VS - stable. Took her off O2 and she maintained sats at 95% and above. Benadryl  25mg  was give after exposure. Continue close monitoring.       -------------------------------------------------------------------------------     Daily Progress Note - Medical Teaching Service        Subjective        Robin Mendoza is a 23 y.o. admitted for possible SI and refeeding syndrome secondary to dumping her TPN down the sink for over 2 months.      24-hour update:   Patient reports some improvement in her dizziness and lightheadedness.  She is still having some abdominal discomfort though it has improved since NG tube was placed.  She expressed concern that she will not be able to take her Wellbutrin  and Cymbalta due to her nausea.  She also stated that her Dulcolax has not been timed correctly and stated she normally gets it at 930, 2 PM, and 5pm.  Patient denies CP, SOB     Objective     Vitals:   Most Recent: Range (last 24 hours)   Temperature: 98.2 F (36.8 C)  [98.1 F (36.7 C)-98.6 F (37 C)]    Heart Rate: 92  [56-93]    Resp Rate: 16  [11-18]    Blood Pressure: 92/60 (92-127)/(60-86)    Oxygen Sats: 98 %  [98 %-100 %]    Oxygen Device: Room Air    Weight: 49.7 kg (109 lb 9.1 oz)        Intake/Output Summary (Last 24 hours) at 01/15/2024 0856  Last data filed at 01/15/2024 0325  Gross per 24 hour   Intake 2771.68 ml   Output --   Net 2771.68 ml       Physical Exam:   The patient was examined on 01/15/24. The documentation below reflects today's exam.    Physical Exam  Constitutional:       General: She is not  in acute distress.     Appearance: Normal appearance.   HENT:      Mouth/Throat:      Mouth: Mucous membranes are moist.   Eyes:      Conjunctiva/sclera: Conjunctivae normal.   Cardiovascular:      Rate and Rhythm: Normal rate and regular rhythm.      Heart sounds: Normal heart sounds.   Pulmonary:      Effort: Pulmonary effort is normal.      Breath sounds: Normal breath sounds.   Abdominal:      General: Bowel sounds are normal. There is no distension.      Palpations: Abdomen is soft.      Tenderness: There is abdominal tenderness. There is no guarding or rebound.   Musculoskeletal:      Right lower leg: No edema.      Left lower leg: No edema.   Skin:     General: Skin is warm.   Neurological:      General: No focal deficit present.      Mental Status: She is alert and oriented to person, place, and time.           Select Recent Labs:    Selected Recent Results   Lab Units 01/15/24  0444   WBC K/uL 5.1   HEMOGLOBIN g/dL 89.9*   HEMATOCRIT % 69.2*   PLATELETS K/uL 216     Selected Recent Results   Lab Units 01/14/24  2104   SODIUM mmol/L 144   POTASSIUM mmol/L 4.1   CHLORIDE mmol/L 109*   CO2 mmol/L 26   BUN mg/dL 3*   CREATININE mg/dL 9.39   GLUCOSE mg/dL 894*   CALCIUM mg/dL 9.0         Pertinent Imaging Studies:   TEE 01/14/24:   Normal left ventricle cavity size.  .  The left ventricular systolic function is normal (55-65%).  .  The right ventricular systolic function is normal.  .  There is no thrombus present in the left atrium. There is no thrombus in the left atrial appendage.  .  Mild tricuspid valve regurgitation.       Assessment & Plan     Robin Mendoza is a 23 y.o. being treated for:  Assessment & Plan  Streptococcal bacteremia  Present on Admission: Unknown  Abnormal echocardiogram  Present on Admission: Unknown  Staphylococcus epidermidis bacteremia  Present on Admission: Unknown  Patient met sepsis criteria with fever tachycardia and tachypnea on 9/1. Blood culture PCR drawn from PICC line showed strep mitis and staph epidermidis. Peripheral blood culture has no growth.  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  Given these findings, PICC line was removed 9/3, and new left-sided PICC was placed.  TEE without obvious vegetations. Given vegetation initially seen on TTE, will treat with 6 weeks of antibiotics. Can likely transition patient to Rocephin prior to discharge.    - Peripheral blood culture remains no growth (9/1), second set of blood cultures are no growth x 40 hours (9/3)  - Continue cefazolin  2 g q8 hours  - IV maintenance fluids D5 LR  - Will keep PICC line in place while awaiting blood culture results  Avoidant-restrictive food intake disorder (ARFID)  Present on Admission: Yes  Electrolyte abnormality  Present on Admission: Unknown  Refeeding syndrome  Present on Admission: Unknown  Patient has a PICC line  from home and has been getting nutrition via TPN.  She reportedly has been tolerating her  TPN for the last several months.  She has lost 30 pounds since March 2025.  PICC line was replaced with a double-lumen PICC line during this admission due to bacteremia as above. Patient will likely need Port placed with IR as outpatient after 6 weeks of abx complete.     - Continue TPN  - Refeeding labs twice daily (CMP, Mg, Phos)  - Replace electrolytes as needed  - POC blood glucose every 4 hours    Suicidal ideation  Present on Admission: Not Applicable  MDD (major depressive disorder)  Present on Admission: Yes  GAD (generalized anxiety disorder)  Present on Admission: Yes  Patient denying SI, but admits to self-harm via dumping TPN down the same for several months.  She says she is afraid to share her true thoughts regarding hurting herself out of fear that her electronics may be taken away (they are her coping mechanism for her autism).  Family is concerned for SI based on the several text messages she is not possibly threatening to hurt herself.  Psychiatry has been following this patient.  Today made additional comments about wanting to jump out the window and not wanting to be here.  Psychiatry was updated of these comments.  They are continuing to follow her and provide additional recommendations.    - Psychiatry following, appreciate recommendations  - ITC for leaving hospital AMA  - PTA Wellbutrin  XL 150 mg  - PTA Intuniv  4 mg  - PTA Cymbalta mg  - Klonopin 0.25 mg BID  - As needed Ativan  for anxiety    Nondiabetic gastroparesis  Present on Admission: Yes  Patient is longstanding history of nondiabetic gastroparesis.  Patient has not had a bowel movement this week.  Prior to admission, was planned to go to [facility] for further studies. Will start TPN once additional access is available.  Patient denies bowel movement since being in the hospital. Abdominal xray with increased bowel gas and distal area of  stool which likely represents ileus. NG tube was placed on 9/4 for decompression and she reports some improvement in symptoms.  Will repeat KUB today and restart bowel regimen if there is improvement.    - Pharmacy and dietitian consulted for assistance with TPN preparation.  - Diet ordered, patient okay for p.o. intake as tolerated  - PTA Phenergan , prucalopride, Dulcolax, Zofran , Compazine   Syncope  Present on Admission: Unknown  Patient has had multiple episodes of syncope over the last several days. Possible in the setting of sepsis vs poor nutrition. Continuing with IVMF and nutrition plan as above.  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  TEE from 9/4 showed no obvious vegetations.  Syncopal symptoms have improved.    Autism  Present on Admission: Yes  Patient denying SI, but admits to self-harm via dumping TPN down the same for several months.  She says she is afraid to share her true thoughts regarding hurting herself out of fear that her electronics may be taken away (they are her coping mechanism for her autism).  Family is concerned for SI based on the several text messages she is not possibly threatening to hurt herself.  -Psychiatry consulted, appreciate recommendations    Normocytic anemia  Present on Admission: Unknown  History of iron deficiency anemia, likely in the setting of malnutrition.  Had iron infusion 12/23/2023.  Hemoglobin is at baseline. Hb stable today. Will monitor for signs and symptoms of anemia.  - Daily CBC  Hyperglycemia  Present on  Admission: Unknown  Patient with elevated blood glucose after resuming TPN.  Added sliding scale insulin    DVT Prophylaxis -Lovenox     Underweight - BMI (Calculated): 18.23                 Anticipated Discharge Disposition & Barriers to Discharge: ITC to leave AMA while ongoing treatment for nutrition needs             Lorrene Omega Molt, MD  Medical Teaching Service

## 2024-01-15 NOTE — Progress Notes (Signed)
 Clinical Nutrition   Progress Note 01/15/24    Clinical Nutrition 11:11 AM    Robin Mendoza, RD LOS: 5     Recommendations/Interventions     Continue Custom Central TPN @ 62 mL/hr w/ 62 g AA, 149 g dextrose  and 30 g lipids.   Monitor labs + electrolytes per TPN order set and replace as indicated  Provider to reorder TPN daily. Expected RD follow up: 01/18/24.     Order Details    Infusion Rate (mL/hr) 62   Infusion Site Central   Weight Used (Recorded) (kg) 49.7         Macronutrients     Amount Range Total   Amino Acids (g/kg) 1.25 0.8-2.5 62 g   Amino Acid Type Clinisol     Lipids (g) 30 <=50 30 g   Dextrose  (g) 148.8 100-450 148.8 g   Dextrose  Concentration (%) 10 5-20    Glucose Infusion Rate (mg/kg/min) 2.08 <=5          Energy Contribution     kcal %   Protein 248 23.53   Dextrose  505.92 48   Lipids 300 28.47   Total 1,053.92           Electrolytes    Cations Amount Range Total   Sodium (mEq/L) 50 <=150 74.4 mEq   Potassium (mEq/L) 40.01 <=80 59.53 mEq   Calcium (mEq/L) 10 <=10 14.88 mEq   Magnesium  (mEq/L) 8 <=15 11.9 mEq   Anions      Phosphate (mmol/L) 8 <=15 11.91 mmol   Chloride (mEq/L) 37.85 <=150 56.32 mEq   Acetate (mEq/L) 75.7 <=150 112.64 mEq        Subjective     Reason for Visit: Nutrition Consult - TPN, Eating disorder    Robin Mendoza is a 23 y.o. female admitted with  possible SI and refeeding syndrome secondary to dumping her TPN down the sink for over 2 months.  Has a PMH of nondiabetic gastroparesis, autism, GAD, MDD, RFID, iron deficiency anemia.     01/09/2024: Review of chart shows note from 8/26 indicated that latest [facility] pump  data showed that patient infused only 18, 749 mL of 61,440  mL of TPN. [facility] had recommended pause of PN due to noncompliance. Plan to restart TPN tonight per primary team. Reportedly ran a bag on PN last week and experienced palpitations and racing heart. Is scheduled for [facility] at [facility] next week. Refeeding labs ordered twice per day. Has IVF  of D5W infusing at 75 mL/hr. Patient meets ASPEN diagnostic criteria for malnutrition based on inadequate calories and weight loss.     01/11/2024: Patient not started on TPN. Refeeding labs stable. Will defer to Adolescent Medicine RD for management as patient is currently only on a po diet. Na 139, K+ 3.9, Mg 1.9, Phos 4.1.     01/14/2024: Spoke with provider over the phone yesterday. Provider wanting to resume TPN once central line is placed. RD explained TPN order cutoff is 2 pm and since it was after 2 pm would need to order 9/4. Discussed refeeding syndrome and how we would advance dextrose . Provider ordered central custom TPN today - no changes needed.     01/15/2024: RD assisted with TPN order this evening. Dextrose  advanced and chloride:acetate ratio adjusted to 1:2. Labs reviewed: K, Mg, Phos WNL. Abdominal xray with increased bowel gas and distal area of stool which likely represents ileus. NG tube was placed on 9/4 for decompression.  Objective     Anthropometrics:  Wt Readings from Last 10 Encounters:  01/14/24 : 49.7 kg (109 lb 9.1 oz)  12/02/23 : 54.3 kg (119 lb 12.8 oz)  11/10/23 : 56.2 kg (123 lb 12.8 oz)  10/19/23 : 58.2 kg (128 lb 6.4 oz)  08/03/23 : 63.5 kg (140 lb)  07/23/23 : 65.3 kg (144 lb)  07/02/23 : 58.7 kg (129 lb 8 oz)  06/17/23 : 59 kg (130 lb)  06/02/23 : 62.1 kg (136 lb 12.8 oz)  05/27/23 : 61.7 kg (136 lb)      Height is 165.1 cm (65).  Body mass index is 18.23 kg/m.  IBW (calculated) is 56.5 kg.  Weight Change: loss of 10.1% x 3 months    Allergies:  Milk, Milk containing products (dairy), Cat dander, and Dog dander    Physical Exam (per nursing flowsheet):  Gastrointestinal Last BM Date: 01/09/24 (01/12/24 1009)  Bowel Incontinence: No (01/14/24 1925)  Stool Amount: Medium (01/09/24 0303)  Stool Characteristics [facility]): Not Visualized (pt stated she had a BM) (01/09/24 0913)  Stool Color: Yellow, Brown, Other (Comment) (food contents) (01/09/24 0303)   Edema     Dentition Intact    Skin Skin Integrity  Skin Integrity: Abrasion, Bruising  Skin Location: scattered                         Active Lines       Peripherally Inserted Central Catheter Line  Duration             PICC Double Lumen 01/13/24 Left Basilic 1 day              Drain  Duration             NG/OG GI Tube 01/14/24 [place] Left Nostril <1 day              Airway  Duration             Nasal Cannula <1 day                   Date 01/14/24 0700 - 01/15/24 0659 01/15/24 0700 - 01/16/24 0659   Shift 0700-1459 1500-2259 2300-0659 24 Hour Total 0700-1459 1500-2259 2300-0659 24 Hour Total   INTAKE   I.V.(mL/kg) 1355.1(27.3)  1047.5(21.1) 2402.6(48.3)       TPN   369.1 369.1       Shift Total(mL/kg) 1355.1(27.3)  1416.6(28.5) 2771.7(55.8)       OUTPUT   Shift Total(mL/kg)           Weight (kg) 49.7 49.7 49.7 49.7 49.7 49.7 49.7 49.7       Medications:  Scheduled Meds Continuous Infusions PRN Meds   bisacodyL , 15 mg, TID  buPROPion , 150 mg, Daily  ceFAZolin , 2 g, Q8H  clonazePAM, 0.25 mg, BID  DULoxetine, 30 mg, Daily  enoxaparin  (LOVENOX ) injection for prophylaxis, 40 mg, Daily  flu vaccine trivalent 6 mos+ PF (2025-26), 0.5 mL, Once  guanFACINE , 4 mg, Daily  insulin lispro, 1-5 Units, TID (08, 1200, & 1700)   And  insulin lispro, 0-4 Units, Bedtime  multivitamin-minerals, 1 tablet, Daily  promethazine , 50 mg, Bedtime  prucalopride, 2 mg, Daily     TPN - Central Access - Customized, Last Rate: 62 mL/hr at 01/15/24 0325     acetaminophen , 1,000 mg, Q8H PRN  dextrose , 37.5-75 g, PRN   And  dextrose  IV bolus, 50 mL, PRN   And  glucagon  (GLUCAGEN) injection, 1 mg, PRN  dextrose , 37.5-75 g, PRN   And  dextrose  IV bolus, 50 mL, PRN   And  glucagon  (GLUCAGEN) injection, 1 mg, PRN  sodium chloride , 30 mL, PRN   Or  dextrose , 30 mL, PRN  LORazepam , 1 mg, Q6H PRN  melatonin, 5 mg, Bedtime PRN  ondansetron , 4 mg, Once PRN  ondansetron , 4 mg, Q4H PRN  polyethylene glycol, 17 g, Daily PRN  prochlorperazine , 5 mg, Daily PRN  promethazine , 12.5 mg,  Q6H PRN         Labs:  Lab Results   Component Value Date    NA 142 01/15/2024    K 4.0 01/15/2024    CL 109 (H) 01/15/2024    BUN 5 (L) 01/15/2024    BILITOT <0.2 01/15/2024    ALT 18 01/15/2024    AST 30 01/15/2024    ALKPHOS 92 01/15/2024    MG 2.1 01/15/2024    PHOS 4.2 01/15/2024    CALCIUM 9.3 01/15/2024    GLUCOSE 87 01/15/2024    HGBA1C 5.2 08/10/2023    CHOL 156 03/20/2023    LDLCALC 90 03/20/2023    HDL 33 (L) 03/20/2023    TRIG 105 01/14/2024    CHHDL 4.7 (H) 03/20/2023         Nutrition Assessment/Plan     Nutrition Risk: High    Nutrition Diagnosis:  Inadequate oral intake related to clinical status as evidenced by dumping TPN down sink for > 1 month.    Malnutrition  Classification: Chronic - Severe  Problem Related To: Eating disorder, non compliance with TPN  Weight Loss: Severe  % Loss: >7.5% in 3 months  Energy Intake: Meeting less than 75% of estimated needs for greater than 1 month  Malnutrition Diagnosis: Severe Malnutrition  Treatment: PN ordered    Diet/Supplements:  Food Preferences  TPN - Central Access - Customized  NPO Diet NPO except meds  Appetite: Poor (01/12/24 1515)  Breakfast Eaten (%): Refused (01/10/24 0915)  Lunch Eaten (%): 1-25% (01/12/24 1515)  Snack Eaten (%): 26-50% (01/08/24 2300)    Nutrition Support:  Parenteral Nutrition    See above for order details       Nutritional Needs:  Calories: 1560 kcals/day - 1820 kcals/day  30 kcals/kg - 35 kcals/kg      Equation Chosen to Use by RD: KCal/kg  Protein: 62.4 g/day - 72.8 g/day  1.2 g/kg - 1.4 g/kg      Method for Estimating Needs: grams per kg  Fluid:   -        Method for Estimating Needs: Per provider    Nutrition Goal:  PN initiation/advancement  Intakes meeting 75-100% of calorie and protein needs    Monitoring/Evaluation:  RD will continue to monitor nutritional intake, weight trends, labs, fluid balance, GI function, skin integrity.      Robin Mendoza, RD

## 2024-01-15 NOTE — Progress Notes (Addendum)
 DCP Pending>     Barriers:  Got NG 9/4 LIS for n/v.   1:1 sitter    Faxed clinical updates for hospital transfer to: [facility] Fax [phone] attn: Charlena [address], [place], [place] [id number].      Current with [facility] T[facility].

## 2024-01-16 NOTE — Progress Notes (Signed)
 -------------------------------------------------------------------------------  Attestation signed by Angelo Spray, MD at 01/16/2024  2:36 PM  I have seen and evaluated the patient. Discussed with Dr Melonie and agree with the findings and plan as documented in the resident's note.    AXR has improved. Patient had several bowel movements, with less fecal material noted on AXR  D/w her mother Robin Mendoza and preference would be to keep NGT for today. I think we can take it out tomorrow.   Discussed initiation of marinol with Dr Donn which he recommends. Unfortunately, there is a Sport and exercise psychologist. Will discuss with pharmacy re: how this can be obtained going forward.   Continue management as previously detailed.   -------------------------------------------------------------------------------     Daily Progress Note - Medical Teaching Service        Subjective        Robin Mendoza is a 23 y.o. admitted for possible SI and refeeding syndrome secondary to dumping her TPN down the sink for over 2 months.     24-hour update:   Patient has an exposure to milk yesterday afternoon which she reports she is allergic to.  She had itching.  Was given IV Benadryl  with improvement in symptoms.  When seen this morning, patient reports she had 2 bowel movements last night and is having improvement in her abdominal pain, bloating, and nausea.  She is requesting NG be removed.     Objective     Vitals:   Most Recent: Range (last 24 hours)   Temperature: 98.4 F (36.9 C)  [98.2 F (36.8 C)-99.5 F (37.5 C)]    Heart Rate: 86  [84-126]    Resp Rate: 14  [14-16]    Blood Pressure: (!) 89/49 (89-110)/(49-82)    Oxygen Sats: 99 %  [97 %-99 %]    Oxygen Device: Room Air    Weight: 53.1 kg (117 lb 1 oz)      No intake or output data in the 24 hours ending 01/16/24 0610      Physical Exam:   The patient was examined on 01/16/24. The documentation below reflects today's exam.    Physical Exam  Constitutional:       General: She is not in acute  distress.     Appearance: Normal appearance.   HENT:      Mouth/Throat:      Mouth: Mucous membranes are moist.   Eyes:      Conjunctiva/sclera: Conjunctivae normal.   Cardiovascular:      Rate and Rhythm: Normal rate and regular rhythm.      Heart sounds: Normal heart sounds.   Pulmonary:      Effort: Pulmonary effort is normal.      Breath sounds: Normal breath sounds.   Abdominal:      General: Bowel sounds are normal. There is no distension.      Palpations: Abdomen is soft.      Tenderness: There is abdominal tenderness. There is no guarding or rebound.      Comments: Bloating improved from yesterday   Musculoskeletal:      Right lower leg: No edema.      Left lower leg: No edema.   Skin:     General: Skin is warm.   Neurological:      General: No focal deficit present.      Mental Status: She is alert and oriented to person, place, and time.           Select Recent Labs:   Selected Recent  Results   Lab Units 01/15/24  0444   WBC K/uL 5.1   HEMOGLOBIN g/dL 89.9*   HEMATOCRIT % 69.2*   PLATELETS K/uL 216     Selected Recent Results   Lab Units 01/15/24  1806   SODIUM mmol/L 145   POTASSIUM mmol/L 4.1   CHLORIDE mmol/L 110*   CO2 mmol/L 27   BUN mg/dL 8   CREATININE mg/dL 9.32   GLUCOSE mg/dL 98   CALCIUM mg/dL 9.0         Pertinent Imaging Studies:   TEE 01/14/24:   Normal left ventricle cavity size.  .  The left ventricular systolic function is normal (55-65%).  .  The right ventricular systolic function is normal.  .  There is no thrombus present in the left atrium. There is no thrombus in the left atrial appendage.  .  Mild tricuspid valve regurgitation.       Assessment & Plan     Robin Mendoza is a 23 y.o. being treated for:  Assessment & Plan  Streptococcal bacteremia  Present on Admission: Unknown  Abnormal echocardiogram  Present on Admission: Unknown  Staphylococcus epidermidis bacteremia  Present on Admission: Unknown  Patient met sepsis criteria with fever tachycardia and tachypnea on 9/1. Blood culture PCR  drawn from PICC line showed strep mitis and staph epidermidis. Peripheral blood culture has no growth.  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  Given these findings, PICC line was removed 9/3, and new left-sided PICC was placed.  TEE without obvious vegetations. Given vegetation initially seen on TTE, will treat with 6 weeks of antibiotics. Can likely transition patient to Rocephin prior to discharge.  Vitals normalized.    - Peripheral blood culture remains no growth (9/1), second set of blood cultures are no growth at 2 days (9/3)  - Continue cefazolin  2 g q8 hours (9/1-)  - IV maintenance fluids D5 LR  - Will keep PICC line in place while awaiting blood culture results  Avoidant-restrictive food intake disorder (ARFID)  Present on Admission: Yes  Electrolyte abnormality  Present on Admission: Unknown  Refeeding syndrome  Present on Admission: Unknown  Patient has a PICC line from home and has been getting nutrition via TPN.  She reportedly has been tolerating her TPN for the last several months.  She has lost 30 pounds since March 2025.  PICC line was replaced with a double-lumen PICC line during this admission due to bacteremia as above. Patient will likely need Port placed with IR as outpatient after 6 weeks of abx complete.     - Continue TPN  - Refeeding labs twice daily (CMP, Mg, Phos)  - Replace electrolytes as needed  - POC blood glucose every 4 hours    Suicidal ideation  Present on Admission: Not Applicable  MDD (major depressive disorder)  Present on Admission: Yes  GAD (generalized anxiety disorder)  Present on Admission: Yes  Patient denying SI, but admits to self-harm via dumping TPN down the same for several months.  She says she is afraid to share her true thoughts regarding hurting herself out of fear that her electronics may be taken away (they are her coping mechanism for her autism).  Throughout hospitalization, patient has made comments about jumping out the window.  Psychiatry has been following this patient.  Recommended discontinuing Wellbutrin  as it is well typically contraindicated in eating disorders.  She is also been on this for 6 months without benefit.    - Psychiatry following,  appreciate recommendations  - ITC for leaving hospital AMA  - Stop Wellbutrin  XL 150 mg  - PTA Intuniv  4 mg  - PTA Cymbalta mg q. nightly  - Klonopin 0.25 mg BID  - As needed Ativan  for anxiety    Nondiabetic gastroparesis  Present on Admission: Yes  Patient is longstanding history of nondiabetic gastroparesis.  Patient has not had a bowel movement this week.  Prior to admission, was planned to go to [facility] for further studies. Will start TPN once additional access is available.  Patient denies bowel movement since being in the hospital. Abdominal xray with increased bowel gas and distal area of stool which likely represents ileus. NG tube was placed on 9/4 for decompression and she reports some improvement in symptoms.  Patient reports having a bowel movement overnight. Will repeat KUB and consider removing NG tube if there is improvement.     - Pharmacy and dietitian consulted for assistance with TPN preparation.  - Diet ordered, patient okay for p.o. intake as tolerated  - PTA Phenergan , prucalopride, Dulcolax, Zofran , Compazine   Syncope  Present on Admission: Unknown  Patient has had multiple episodes of syncope over the last several days. Possible in the setting of sepsis vs poor nutrition. Continuing with IVMF and nutrition plan as above.  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  TEE from 9/4 showed no obvious vegetations.  She was feeling lightheaded overnight and given 500 mL bolus.  Autism  Present on Admission: Yes  Patient denying SI, but admits to self-harm via dumping TPN down the same for several months.  She says she is afraid to share her true thoughts regarding hurting herself out of fear that her electronics may be taken away (they  are her coping mechanism for her autism).  Family is concerned for SI based on the several text messages she is not possibly threatening to hurt herself.  -Psychiatry consulted, appreciate recommendations    Normocytic anemia  Present on Admission: Unknown  History of iron deficiency anemia, likely in the setting of malnutrition.  Had iron infusion 12/23/2023.  Hemoglobin is at baseline. Hb stable today. Will monitor for signs and symptoms of anemia.  - Daily CBC  Hyperglycemia  Present on Admission: Unknown  Patient with elevated blood glucose after resuming TPN.  Added sliding scale insulin  Hypothyroidism  Present on Admission: Unknown  Possibly secondary to nutritional deficiencies.  Free T4 was low on 9/5.  Decided to start low-dose levothyroxine.  Will monitor her TSH and free T4 closely as she resumes nutrition.  - Levothyroxine 12.5 mcg daily  - Monitor TSH, free T4 q. 6 to 8 weeks    DVT Prophylaxis -Lovenox       - BMI (Calculated): 19.48                 Anticipated Discharge Disposition & Barriers to Discharge: ITC to leave AMA while ongoing treatment for nutrition needs             Lorrene Omega Molt, MD  Medical Teaching Service

## 2024-01-17 NOTE — Progress Notes (Signed)
 [facility] MULTISPECIALTY TELEMETRY  [address]  [place] [address]  Loc: [phone]    [facility] Medicine Inpatient Progress Note  Date of Service: 01/17/2024  Primary Care Provider:   Donn Ozell Tonette Mickey., MD       Name: Robin Mendoza       DOB: 31-Aug-2000       Age:23 y.o.      Sex: female      MRN: [id number]       Virtual Visit - Video    Telehealth Platform: [facility] Health Preferred    Indication for Telemedicine Visit:  Interval [facility] Viist    Patient Location / Address:  [facility] - Inpatient  [facility]    Provider facility same as patient? Yes    Participants:   Ozell Tonette Donn Mickey, MD Examiner   Chrystine Con Louder Patient   Parent, mom           Telemedicine Visit Start Time: 11:00AM Telemedicine Visit End Time: 12:10PM  Total Time (in min): 70    Consent: Patient or authorized representative has given consent to care via virtual visit.      Medical Concerns Supporting Current Need for Hospitalization:  Resolving Sepsis  Risk of Refeeding Syndrome  ITC due to impaired judgement and insight related to medical and nutritional needs    Please BLOCK all progress and clinical notes for this patient, including those from nursing staff, due to Serious Risk of Self-Harm due to Eating Disorder Thought Process.    If unsure how to do so, please contact Adolescent Medicine at the info below.    Subjective     At the visit today, I was able to speak with both patient and mom.  Patient states that yesterday, the NG tube remained in place and suctioning was needed due to feeling that.  Patient did have quite a bit of volume that came out of her stomach as suction was applied, therefore the tube has remained in place today.  Patient still states she would like the tube out, as it hurts her throat overall.  Patient does not feel that Chloraseptic spray has been helpful with this pain.  We were able to talk a bit about the need for the tube especially  if you are still having significant need for suctioning, and mom is in support of keeping the tube in place rather than having to risk replacing the tube if we take it out prematurely.  Patient states that she is willing to have the tube in place if we can be more effective related to the throat pain that she is experiencing.    Patient currently does have the motivation to eat foods, noting that she would like to eat toast, cinnamon rolls, and continue to drink silk soy milk.  Patient has also been resistant to taking medications, but after talking today the importance of doing this, patient is able to take medications in front of me.  Patient continues to receive her TPN and has had 4-5 loose stools that were small in caliber yesterday.    Patient continues to have some nausea, and has noted that Marinol has not been started as of yet.  Mom also states that she is very interested in starting the supplement we discussed in the past, N-acetylcysteine, to help with ruminative thoughts.    The otherwise, the remainder of the conversation is held outside of the room with mom as a CD to use the  restroom as we were continuing to talk.  As a medical update, patient continues to have an anemia of 9.3 but other labs have been stable.  Patient's vital signs are improving with a heart rate now in the 60s to 70s.    I was able to speak with mom and dad separately on the phone and they state agreement that there are several aspects to patient's clinical condition that have improved. Parents state that they are still in the process of obtaining guardianship and they have an appointment with a lawyer this coming week to further discuss.  They have mentioned going to [facility], and she has had some expression of willingness to go, noting she has a friend currently there.  Parents state they are on board with patient going here for ED treatment and further medical stabilization, and then to enter all the levels of  care needed for continued ED treatment.    No other specific concerns are noted at this time.    HEADDSSS Assessment <redacted file path>    Scheduled Meds:bisacodyL , 15 mg, Oral, TID  buPROPion , 150 mg, Oral, Daily  ceFAZolin , 2 g, Intravenous, Q8H  clonazePAM, 0.25 mg, Oral/Gastric Tube, BID  DULoxetine, 30 mg, Oral, Daily  enoxaparin  (LOVENOX ) injection for prophylaxis, 40 mg, Subcutaneous, Daily  flu vaccine trivalent 6 mos+ PF (2025-26), 0.5 mL, Intramuscular, Once  guanFACINE , 4 mg, Oral, Daily  insulin lispro, 1-5 Units, Subcutaneous, TID (08, 1200, & 1700)   And  insulin lispro, 0-4 Units, Subcutaneous, Bedtime  levothyroxine, 12.5 mcg, Oral/Gastric Tube, Daily  multivitamin-minerals, 1 tablet, Oral/Gastric Tube, Daily  promethazine , 50 mg, Oral, Bedtime  prucalopride, 2 mg, Oral, Daily      Continuous Infusions:TPN - Central Access - Customized, , Last Rate: 62 mL/hr at 01/16/24 2218      PRN Meds:..  acetaminophen   .  [COMPLETED] Adult Hypoglycemic Standing Orders for Inpatient and Procedural Areas **AND** Glucose by Meter-POC **AND** dextrose  **AND** dextrose  IV bolus **AND** glucagon  (GLUCAGEN) injection  .  [EXPIRED] Adult Hypoglycemic Standing Orders for Inpatient and Procedural Areas **AND** Glucose by Meter-POC **AND** dextrose  **AND** dextrose  IV bolus **AND** glucagon  (GLUCAGEN) injection  .  sodium chloride  **OR** dextrose   .  LORazepam   .  melatonin  .  ondansetron   .  phenoL  .  polyethylene glycol  .  prochlorperazine   .  promethazine     Review of Systems   Constitutional:  Positive for appetite change and fatigue.   HENT: Negative.     Eyes: Negative.    Respiratory: Negative.     Cardiovascular: Negative.    Gastrointestinal:  Positive for abdominal distention, abdominal pain and nausea.   Endocrine: Negative.    Genitourinary: Negative.    Musculoskeletal: Negative.    Skin: Negative.    Allergic/Immunologic: Negative.    Neurological: Negative.    Hematological: Negative.     Psychiatric/Behavioral:  Positive for dysphoric mood and sleep disturbance. The patient is nervous/anxious.    All other systems reviewed and are negative.         Objective     Vitals:    01/17/24 0450 01/17/24 0600 01/17/24 0755 01/17/24 0756   BP:   101/57    BP Location:   Right arm    Patient Position:   Lying    Pulse: 64   69   Resp: 14   14   Temp: 97.7 F (36.5 C)  98.2 F (36.8 C)    TempSrc: Oral  Oral    SpO2: 99%   100%   Weight:  51 kg (112 lb 7 oz)     Height:         No LMP recorded (lmp unknown). (Menstrual status: IUD Contraception).  Weight change since admission -2.49 %      141 104 9 / 81 Ca 9.1 AST 20 AlkP 84   4.1 30 0.70 \  Bili 0.2 ALT 8 Alb 2.6     5.6  \ 9.3 / 233   N 42.2% M 8.9%    / 28.6 \   L 43.2% E 5.0%       Physical Exam  Constitutional:       General: She is not in acute distress.  HENT:      Head: Normocephalic and atraumatic.      Nose: Nose normal.      Comments: NG tube in place  Eyes:      Extraocular Movements: Extraocular movements intact.   Musculoskeletal:         General: Normal range of motion.      Cervical back: Normal range of motion.   Neurological:      General: No focal deficit present.      Mental Status: She is alert and oriented to person, place, and time. Mental status is at baseline.   Psychiatric:         Mood and Affect: Mood normal.         Behavior: Behavior normal.              Assessment and Plan   Assessment:     Semira Stoltzfus is a 23 y.o. female with: Avoidant/Restrictive Food Intake Disorder with complication medical features, including gastroparesis, recent sepsis, and colonic inertia    Assessment of Stability:  Medical Stability: Fragile  Nutritional Stability: Fragile  Behavior Stability:  Unstable    Current Refeeding Syndrome Risk (based on hospital progression):  Moderate     Recommendations:     1.) Patient's refeeding risk level has partially mitigated since admission.  As such, the following is recommended:    - Continue with  lab evaluation per disordered eating guidelines and current refeeding syndrome risk level  - If any lab values are decreased from normal levels, supplementation should be initiated   - Labs can be discontinued at primary team discretion    2.) I fully support and appreciate all recommendations from the consulting teams.    3.) My specific recommendations regarding this patient include the following:    While hospitalized, patient should have daily weights that are blinded, post void, on the same scale, at the same time of morning, and while wearing only gown and undergarments  Level of daily activity can be determined per the prerogative of the Primary team based on medical stability, but I do recommend that patient use a wheelchair if navigating off the floor to help conserve energy  Nutrition:  See RD note for detailed meal plan guidance  Other Recommendation(s): At this time, I recommend that patient's NG suction tube remain in place as it does appear to be providing more benefit than harm (despite patient's desire to have it removed).  I am in support of escalating pain management for her throat pain, and can consider the addition of Toradol  IV as needed over 2-3 days (to avoid risk of bleeding) and also viscous lidocaine  gargle (it has been made very clear for patient to gargle and spit rather than  swallow if this becomes a valid option).    I recommend to move forward with addition of Marinol for nausea, anxiety and  potential pain alleviation.  Can continue to use low dose Zofran  as needed safely with this medication.    I recommend that N-Acetyl Cysteine be added to current medications to help with perseveration of thought in the setting of patient's autism.  A dosage of 600mg  BID has been supported by the literature, and this can be added as a supplement from home if it currently is not on formulary.  Recommend to continue current TPN regimen for now and I have no objection to allowing motivational foods by  mouth with suction off from the NG tube.    4.) Regarding Disposition, I have the following recommendations:    Other: I have been able to have discussions last week with patient's parents as well as an outpatient therapist involved in patient's care.  In addition, we have had discussions with [facility] related to transfer to their hospital for further management of patient ARFID and the current medical complexities.  At this time, as Sepsis has resolved, patient is back on nutrition with TPN, and labs and vitals are stabilizing, I do feel it is time to navigate to this disposition if [facility] accepts her as a patient.  I will confirm with them tomorrow if she can still go even if it is involuntary, or if it has to be voluntary.  If patient is not able to go to [facility], I feel the only reasonable dispostion at that itme would be for her to enter into a facility where her nutritional and psychiatric needs could be addressed in a supervised manner.  I do not feel that patient has consistent judgement or insight to be able to care for her nutritional needs and understand those needs living independently.    I still support assistance and evaluation from palliative care and ethics team members, which will occur next week.    Dr. Fairy Sievert will be on service with our office next week, and I will continue t0 assist in the background related to conversations with [facility] and disposition to treatment.       Thank you for allowing me to participate in this patient's care.  Should you have any questions regarding my assessment or recommendations, please do not hesitate to contact me via [employer].     Garrel Catchings, MD, [facility]  Adolescent and Young Adult Medicine, Internal Medicine, and Pediatrics  01/17/2024

## 2024-01-17 NOTE — Progress Notes (Signed)
 -------------------------------------------------------------------------------  Attestation signed by Angelo Spray, MD at 01/17/2024 12:26 PM  I have seen and evaluated the patient. Discussed with Dr Melonie and agree with the findings and plan as documented in the resident's note.    Still some bowel dilation on AXR - NGT in place  Levothyroxine started on 9/6  Appreciate psych and Dr Sueann input  Continue TPN  -------------------------------------------------------------------------------     Daily Progress Note - Medical Teaching Service        Subjective        Shirline is a 23 y.o. admitted for possible SI and refeeding syndrome secondary to dumping her TPN down the sink for over 2 months.     24-hour update:   No acute overnight events. She reports feeling nausea with dry heaves. Complaint of abdominal pain but bloating improved. No CP, SOB.     Objective     Vitals:   Most Recent: Range (last 24 hours)   Temperature: 98.2 F (36.8 C)  [97.7 F (36.5 C)-98.4 F (36.9 C)]    Heart Rate: 69  [64-81]    Resp Rate: 14  [12-16]    Blood Pressure: 101/57 (88-103)/(44-57)    Oxygen Sats: 100 %  [97 %-100 %]    Oxygen Device: Room Air    Weight: 51 kg (112 lb 7 oz)        Intake/Output Summary (Last 24 hours) at 01/17/2024 0913  Last data filed at 01/17/2024 0400  Gross per 24 hour   Intake --   Output 600 ml   Net -600 ml         Physical Exam:   The patient was examined on 01/17/24. The documentation below reflects today's exam.    Physical Exam  Constitutional:       General: She is not in acute distress.     Appearance: Normal appearance.   HENT:      Mouth/Throat:      Mouth: Mucous membranes are moist.   Eyes:      Conjunctiva/sclera: Conjunctivae normal.   Cardiovascular:      Rate and Rhythm: Normal rate and regular rhythm.      Heart sounds: Normal heart sounds.   Pulmonary:      Effort: Pulmonary effort is normal.      Breath sounds: Normal breath sounds.   Abdominal:      General: Bowel sounds are normal.  There is no distension.      Palpations: Abdomen is soft.      Tenderness: There is abdominal tenderness. There is no guarding or rebound.   Musculoskeletal:      Right lower leg: No edema.      Left lower leg: No edema.   Skin:     General: Skin is warm.   Neurological:      General: No focal deficit present.      Mental Status: She is alert and oriented to person, place, and time.           Select Recent Labs:   Selected Recent Results   Lab Units 01/17/24  0437   WBC K/uL 5.6   HEMOGLOBIN g/dL 9.3*   HEMATOCRIT % 71.3*   PLATELETS K/uL 233     Selected Recent Results   Lab Units 01/17/24  0437   SODIUM mmol/L 141   POTASSIUM mmol/L 4.1   CHLORIDE mmol/L 104   CO2 mmol/L 30*   BUN mg/dL 9   CREATININE mg/dL 9.29   GLUCOSE  mg/dL 81   CALCIUM mg/dL 9.1         Pertinent Imaging Studies:        Assessment & Plan     Shirline is a 23 y.o. being treated for:  Assessment & Plan  Streptococcal bacteremia  Present on Admission: Unknown  Abnormal echocardiogram  Present on Admission: Unknown  Staphylococcus epidermidis bacteremia  Present on Admission: Unknown  Patient met sepsis criteria with fever tachycardia and tachypnea on 9/1. Blood culture PCR drawn from PICC line showed strep mitis and staph epidermidis. Peripheral blood culture has no growth.  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  Given these findings, PICC line was removed 9/3, and new left-sided PICC was placed.  TEE without obvious vegetations. Given vegetation initially seen on TTE, will treat with 6 weeks of antibiotics. Vitals normalized.  No additional fevers.  Vital signs within normal limits    - Peripheral blood culture remains no growth (9/1), second set of blood cultures are no growth at 3 days (9/3)  - Continue cefazolin  2 g q8 hours (9/1-10/13)  - Can likely transition to Rocephin prior to discharge    Avoidant-restrictive food intake disorder (ARFID)  Present on Admission: Yes  Electrolyte abnormality  Present on Admission:  Unknown  Refeeding syndrome  Present on Admission: Unknown  Patient has a PICC line from home and has been getting nutrition via TPN.  She reportedly has been tolerating her TPN for the last several months.  She has lost 30 pounds since March 2025.  PICC line was replaced with a double-lumen PICC line during this admission due to bacteremia as above. Patient will likely need Port placed with IR as outpatient after 6 weeks of abx complete.     - Continue TPN  - Refeeding labs twice daily (CMP, Mg, Phos)  - Replace electrolytes as needed    Suicidal ideation  Present on Admission: Not Applicable  MDD (major depressive disorder)  Present on Admission: Yes  GAD (generalized anxiety disorder)  Present on Admission: Yes  Patient denying SI, but admits to self-harm via dumping TPN down the same for several months.  She says she is afraid to share her true thoughts regarding hurting herself out of fear that her electronics may be taken away (they are her coping mechanism for her autism).  Throughout hospitalization, patient has made comments about jumping out the window. Have had discussion with adolescent medicine and family about starting Marinol for anxiety, appetite stimulation, and nausea. Unfortunately, there is a Therapist, art so will reach out to pharmacy today to see if there is a way to get this for patient.    - Psychiatry following, appreciate recommendations  - ITC for leaving hospital AMA  - In person sitter  - Stop Wellbutrin  XL 150 mg  - PTA Intuniv  4 mg  - PTA Cymbalta mg q. nightly  - Klonopin 0.25 mg BID  - As needed Ativan  for anxiety    Nondiabetic gastroparesis  Present on Admission: Yes  Patient is longstanding history of nondiabetic gastroparesis.  Patient has not had a bowel movement this week.  Prior to admission, was planned to go to [facility] for further studies. Will start TPN once additional access is available.  Patient denies bowel movement since being in the hospital.  Abdominal xray with increased bowel gas and distal area of stool which likely represents ileus. NG tube was placed on 9/4 for decompression and she reports some improvement in symptoms.  Patient vomits yesterday with improvement in KUB.  Will keep NG in place while patient is having nausea.    - Pharmacy and dietitian consulted for assistance with TPN preparation.  - Diet ordered, patient okay for p.o. intake as tolerated  - PTA Phenergan , prucalopride, Dulcolax, Zofran , Compazine   - NPO for now  Syncope  Present on Admission: Unknown  Patient has had multiple episodes of syncope over the last several days. Possible in the setting of sepsis vs poor nutrition. Continuing with IVMF and nutrition plan as above.  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  TEE from 9/4 showed no obvious vegetations. Syncopal symptoms have improved.  Autism  Present on Admission: Yes  Patient denying SI, but admits to self-harm via dumping TPN down the same for several months.  She says she is afraid to share her true thoughts regarding hurting herself out of fear that her electronics may be taken away (they are her coping mechanism for her autism).  Family is concerned for SI based on the several text messages she is not possibly threatening to hurt herself.  -Psychiatry consulted, appreciate recommendations    Normocytic anemia  Present on Admission: Unknown  History of iron deficiency anemia, likely in the setting of malnutrition.  Had iron infusion 12/23/2023.  Hemoglobin is at baseline. Will monitor for signs and symptoms of anemia.  Hemoglobin remained stable.  - Daily CBC  Hyperglycemia  Present on Admission: Unknown  Patient with elevated blood glucose after resuming TPN.  Added sliding scale insulin.  Glycemic control is appropriate  Hypothyroidism  Present on Admission: Unknown  Possibly secondary to nutritional deficiencies.  Free T4 was low on 9/5.  Started low-dose levothyroxine.  Will monitor  her TSH and free T4 closely as she resumes nutrition.  - Levothyroxine 12.5 mcg daily  - Monitor TSH, free T4 q. 6 to 8 weeks    DVT Prophylaxis -Lovenox       - BMI (Calculated): 18.71                 Anticipated Discharge Disposition & Barriers to Discharge: ITC to leave AMA while ongoing treatment for nutrition needs             Lorrene Omega Molt, MD  Medical Teaching Service

## 2024-01-18 NOTE — Progress Notes (Signed)
 Clinical Nutrition     Progress Note 01/18/24    Clinical Nutrition 9:39 AM    Robin Mendoza, RD LOS: 8     Recommendations/Interventions     Advance Custom Central TPN @ 62 mL/hr w/ 70 g AA, 179 g dextrose  and 35 g lipids. To provide 1237 kcal  Electrolytes    Cations Amount Range Total   Sodium (mEq/L) 50 <=150 74.4 mEq   Potassium (mEq/L) 35.01 <=80 52.09 mEq   Calcium (mEq/L) 10 <=10 14.88 mEq   Magnesium  (mEq/L) 8 <=15 11.9 mEq   Anions      Phosphate (mmol/L) 4.01 <=15 5.96 mmol   Chloride (mEq/L) 39.65 <=150 59.01 mEq   Acetate (mEq/L) 79.31 <=150 118.01 mEq     Monitor labs + electrolytes per TPN order set and replace as indicated  Provider to reorder TPN daily. Expected RD follow up: 01/20/24.      Subjective     Reason for Visit: Follow Up on Nutrition Consult - TPN, Eating disorder    Robin Mendoza is a 23 y.o. female admitted with  possible SI and refeeding syndrome secondary to dumping her TPN down the sink for over 2 months.  Has a PMH of nondiabetic gastroparesis, autism, GAD, MDD, RFID, iron deficiency anemia.     01/09/2024: Review of chart shows note from 8/26 indicated that latest Coram pump  data showed that patient infused only 18, 749 mL of 61,440  mL of TPN. Coram had recommended pause of PN due to noncompliance. Plan to restart TPN tonight per primary team. Reportedly ran a bag on PN last week and experienced palpitations and racing heart. Is scheduled for GES at [facility] next week. Refeeding labs ordered twice per day. Has IVF of D5W infusing at 75 mL/hr. Patient meets ASPEN diagnostic criteria for malnutrition based on inadequate calories and weight loss.     01/11/2024: Patient not started on TPN. Refeeding labs stable. Will defer to Adolescent Medicine RD for management as patient is currently only on a po diet. Na 139, K+ 3.9, Mg 1.9, Phos 4.1.     01/14/2024: Spoke with provider over the phone yesterday. Provider wanting to resume TPN once central line is placed. RD  explained TPN order cutoff is 2 pm and since it was after 2 pm would need to order 9/4. Discussed refeeding syndrome and how we would advance dextrose . Provider ordered central custom TPN today - no changes needed.     01/15/2024: RD assisted with TPN order this evening. Dextrose  advanced and chloride:acetate ratio adjusted to 1:2. Labs reviewed: K, Mg, Phos WNL. Abdominal xray with increased bowel gas and distal area of stool which likely represents ileus. NG tube was placed on 9/4 for decompression.     01/18/2024: Remains on TPN. NGT in place, 300 ml OP overnight. Labs reviewed - ? The resulted glucose, K and Phos given significant increases from her typical levels without change to TPN nutrients over the weekend.   POC glucose trending 96-150 mg/dl. AM glucose 9/8 = 354 H. K has been trending 4.0 - 4.1 the past several days, 9/8 AM check up to 5.6 H. Phos has been trending 3.4-4.2 x several days and up to 4.6 H on 9/8 check. Plan for re-check later today.      Objective     Anthropometrics:  Wt Readings from Last 10 Encounters:  01/18/24 : 51.8 kg (114 lb 3.2 oz)  12/02/23 : 54.3 kg (119 lb 12.8 oz)  11/10/23 :  56.2 kg (123 lb 12.8 oz)  10/19/23 : 58.2 kg (128 lb 6.4 oz)  08/03/23 : 63.5 kg (140 lb)  07/23/23 : 65.3 kg (144 lb)  07/02/23 : 58.7 kg (129 lb 8 oz)  06/17/23 : 59 kg (130 lb)  06/02/23 : 62.1 kg (136 lb 12.8 oz)  05/27/23 : 61.7 kg (136 lb)      Height is 165.1 cm (65).  Body mass index is 19 kg/m.  IBW (calculated) is 56.5 kg.  Weight Change: loss of 10.1% x 3 months    Allergies:  Milk, Milk containing products (dairy), Cat dander, and Dog dander    Physical Exam (per nursing flowsheet):  Gastrointestinal GI Symptoms: Diarrhea (01/16/24 2200)  Tenderness: Soft (01/16/24 2200)  Last BM Date: 01/16/24 (01/17/24 2323)  Bowel Incontinence: No (01/17/24 2323)  Stool Amount: Small (01/16/24 1112)  Stool Characteristics [facility]): Type 7- Watery, no solid pieces, entirely liquid (01/16/24 1112)  Stool  Color: Tan  (01/16/24 1112)  Passing Flatus: Yes (01/17/24 1044)   Edema     Dentition Intact   Skin Skin Integrity  Skin Integrity: Abrasion, Bruising  Skin Location: scattered                         Active Lines       Peripherally Inserted Central Catheter Line  Duration             PICC Double Lumen 01/13/24 Left Basilic 4 days              Drain  Duration             NG/OG GI Tube 01/14/24 [place] Left Nostril 3 days              Airway  Duration             Nasal Cannula 3 days                   Date 01/17/24 0700 - 01/18/24 0659 01/18/24 0700 - 01/19/24 0659   Shift 0700-1459 1500-2259 2300-0659 24 Hour Total 0700-1459 1500-2259 2300-0659 24 Hour Total   INTAKE   TPN   4213.7 4213.7       Shift Total(mL/kg)   4213.7(82.6) 4213.7(82.6)       OUTPUT   Emesis/NG output   300 300       Shift Total(mL/kg)   300(5.9) 300(5.9)       Weight (kg) 51 51 51 51 51.8 51.8 51.8 51.8       Medications:  Scheduled Meds Continuous Infusions PRN Meds   acetaminophen , 1,000 mg, Q8H SCH  acetylcysteine, 1 capsule, Daily   Followed by  NOREEN ON 01/24/2024] acetylcysteine, 1 capsule, BID  bisacodyL , 15 mg, TID  buPROPion , 150 mg, Daily  ceFAZolin , 2 g, Q8H  clonazePAM, 0.25 mg, BID  DULoxetine, 30 mg, Daily  enoxaparin  (LOVENOX ) injection for prophylaxis, 40 mg, Daily  flu vaccine trivalent 6 mos+ PF (2025-26), 0.5 mL, Once  guanFACINE , 4 mg, Daily  insulin lispro, 1-5 Units, TID (08, 1200, & 1700)   And  insulin lispro, 0-4 Units, Bedtime  levothyroxine, 12.5 mcg, Daily  multivitamin-minerals, 1 tablet, Daily  promethazine , 50 mg, Bedtime  prucalopride, 2 mg, Daily     TPN - Central Access - Customized, Last Rate: 62 mL/hr at 01/18/24 0311     dextrose , 37.5-75 g, PRN   And  dextrose  IV bolus, 50 mL, PRN  And  glucagon  (GLUCAGEN) injection, 1 mg, PRN  dextrose , 37.5-75 g, PRN   And  dextrose  IV bolus, 50 mL, PRN   And  glucagon  (GLUCAGEN) injection, 1 mg, PRN  sodium chloride , 30 mL, PRN   Or  dextrose , 30 mL, PRN  ketorolac ,  15 mg, Q6H PRN  lidocaine , 15 mL, Q8H PRN  LORazepam , 1 mg, Q6H PRN  melatonin, 5 mg, Bedtime PRN  ondansetron , 4 mg, Q4H PRN  phenoL, 1 spray, Q2H PRN  polyethylene glycol, 17 g, Daily PRN  prochlorperazine , 5 mg, Daily PRN  promethazine , 12.5 mg, Q6H PRN         Labs:  Lab Results   Component Value Date    NA 137 01/18/2024    K 5.6 (H) 01/18/2024    CL 101 01/18/2024    BUN 12 01/18/2024    BILITOT 0.2 01/18/2024    ALT 11 01/18/2024    AST 37 (H) 01/18/2024    ALKPHOS 79 01/18/2024    MG 2.1 01/18/2024    PHOS 4.6 (H) 01/18/2024    CALCIUM 9.6 01/18/2024    GLUCOSE 354 (H) 01/18/2024    HGBA1C 5.2 08/10/2023    CHOL 156 03/20/2023    LDLCALC 90 03/20/2023    HDL 33 (L) 03/20/2023    TRIG 105 01/14/2024    CHHDL 4.7 (H) 03/20/2023         Nutrition Assessment/Plan     Nutrition Risk: High    Nutrition Diagnosis:  Inadequate oral intake related to clinical status as evidenced by dumping TPN down sink for > 1 month.    Malnutrition  Classification: Chronic - Severe  Problem Related To: Eating disorder, non compliance with TPN  Weight Loss: Severe  % Loss: >7.5% in 3 months  Energy Intake: Meeting less than 75% of estimated needs for greater than 1 month  Malnutrition Diagnosis: Severe Malnutrition  Treatment: PN ordered    Diet/Supplements:  Food Preferences  TPN - Central Access - Customized  Advance Diet as Tolerated to Regular  Appetite: Poor (01/12/24 1515)  Breakfast Eaten (%): Refused (01/10/24 0915)  Lunch Eaten (%): 1-25% (01/12/24 1515)  Snack Eaten (%): 26-50% (01/08/24 2300)    Nutrition Support:  Parenteral Nutrition   Parenteral Access: PICC  Order: Central Customized TPN @ 62 ml/hr (62 g AA, 100 g / L dex, 30 g Lipids)  Total Calories (kcals): 1053 kcals  Total Protein (grams): 62 g  Total Lipids (ml): 150 mL  Total volume (mL): 1488 mL  Electrolytes    Cations Amount Range Total   Sodium (mEq/L) 50 <=150 74.4 mEq   Potassium (mEq/L) 40.01 <=80 59.53 mEq   Calcium (mEq/L) 10 <=10 14.88 mEq   Magnesium   (mEq/L) 8 <=15 11.9 mEq   Anions      Phosphate (mmol/L) 8 <=15 11.91 mmol   Chloride (mEq/L) 37.85 <=150 56.32 mEq   Acetate (mEq/L) 75.7 <=150 112.64 mEq        Nutritional Needs:  Calories: 1560 kcals/day - 1820 kcals/day  30 kcals/kg - 35 kcals/kg      Equation Chosen to Use by RD: KCal/kg  Protein: 62.4 g/day - 72.8 g/day  1.2 g/kg - 1.4 g/kg      Method for Estimating Needs: grams per kg  Fluid:   -        Method for Estimating Needs: Per provider    Nutrition Goal:  PN initiation/advancement  Intakes meeting 75-100% of calorie and protein needs  Monitoring/Evaluation:  RD will continue to monitor nutritional intake, weight trends, labs, fluid balance, GI function, skin integrity.      Robin Mendoza, RD

## 2024-01-18 NOTE — Care Coordination-Inpatient (Signed)
 Ambulatory Care Coordination Note     01/18/2024 3:39 PM     Patient Current Location:  [place]      ACM contacted the parent by telephone. Verified name and DOB with parent as identifiers.         ACM: [name], RN     Challenges to be reviewed by the provider   Additional needs identified to be addressed with provider No                 Method of communication with provider: none.        Care Summary Note: Received an incoming email from the patient's mother informing this ACM that the patient was in a [facility] for sepsis, malnutrition, and SI. She informed this ACM that she has been there for 9 days and is being referred to [facility] in [place] . Mom informed this ACM that they were looking into an air ambulance for transfer.   This ACM followed up by contacting the [facility] manager and the [employer]. The [facility] was found to be in network as a tier 2 on the Flex plan, which the patient has. A list of INN air ambulance companies was also provided to the patient's mother via email.   This ACM spoke with CM Tom Karron at [facility] to provide the list of air ambulances in network and to inform him of the in network status for the facility. Tom informed this ACM that the referral they had been waiting for was received and the clinical and referral information would be sent to the doctors at [facility]. He states that based on the clinical he was reviewing, he did not feel that an air ambulance would be required for transport.   The patient's mother was outreached via telephone and returned this ACM call. Verified the above information with mom, who had received similar information regarding there referral. She thought the air ambulance would be recommended, but was satisfied with a commercial transport as well.   Will follow up with mom and parties involved to determine the status of the patient and if she meets criteria for the treatment at  [facility].       Offered patient enrollment in the Remote Patient Monitoring (RPM) program for in-home monitoring: Patient is not eligible for RPM program because: insurance coverage.     Assessments Completed:   No changes since last call    Medications Reviewed:   Completed during a previous call     Advance Care Planning:   Not reviewed during this call     Care Planning:   Not completed during this call    PCP/Specialist follow up:       Follow Up:   Plan for next ACM outreach in approximately 1-2 days  to complete:  - follow up regarding status.   Caregiver is agreeable to this plan.

## 2024-01-20 NOTE — Care Coordination-Inpatient (Signed)
 Ambulatory Care Coordination Note     01/20/2024 3:19 PM     Patient Current Location:  [place]      ACM contacted the parent by telephone. Verified name and DOB with parent as identifiers.         ACM: [name], RN     Challenges to be reviewed by the provider   Additional needs identified to be addressed with provider No                 Method of communication with provider: none.        Care Summary Note:   Telephonic outreach to the patient's mother to follow up on current status. Mom reports that the patient is expected to be transferred to [facility] tomorrow. This ACM reiterated to mom that a PA would need be completed and that one of the INN air ambulance companies would need to be used. Mom states that told them about the INN options that were provided to her by this ACM. This ACM also had provided the options to Tom Karron at [facility] [place] on 9/8. Tom informed this ACM that they use a company called [employer] and he assured this ACM and mom that it would be covered.   This ACM requested assistance from [employer], via email to verify that the PA's have been completed or initiate one if they have not. This ACM will continue to follow.   Offered patient enrollment in the Remote Patient Monitoring (RPM) program for in-home monitoring: Patient is not eligible for RPM program because: insurance coverage.       Follow Up:   Plan for next ACM outreach in approximately 1-2 days  to complete:  - regarding transfer.   Caregiver is agreeable to this plan.

## 2024-01-22 NOTE — Care Coordination-Inpatient (Signed)
 Ambulatory Care Coordination Note     01/22/2024 1:53 PM     Parent outreach attempt by this ACM today to perform care management follow up . ACM was unable to reach the parent by telephone today;   left voice message requesting a return phone call to this ACM.     ACM: [name], RN     Care Summary Note: Telephonic outreach attempt to follow up regarding her pending transfer to [facility] [place] [facility]. Left a discreet VM with a request for a return call. Will continue to follow.     PCP/Specialist follow up:       Follow Up:   Plan for next ACM outreach in approximately 1-2 days  to complete:  - goal progression.

## 2024-01-26 MED FILL — XOLAIR 300MG/2ML SOSY: 300 300 MG/2ML | SUBCUTANEOUS | 28 days supply | Qty: 4 | Fill #1 | Status: AC

## 2024-02-02 NOTE — Care Coordination-Inpatient (Signed)
 Ambulatory Care Coordination Note     02/02/2024 2:51 PM     Email sent to the patient's mother, clarifying some of the questions that she has regarding insurance coverage. After reviewing claims and identifying resources to assist the patient and her mother. Will continue to communicate and assist as needed.

## 2024-02-16 NOTE — Care Coordination (Signed)
"  Ambulatory Care Coordination Note     02/16/2024 1:25 PM     Patient Current Location:  [place]      Parent contacted the ACM by telephone. Verified name and DOB with parent as identifiers.         ACM: [name], RN     Challenges to be reviewed by the provider   Additional needs identified to be addressed with provider No                 Method of communication with provider: none.    Utilization: Patient has been inpatient for eating disorder treatment for several weeks at [facility] in [place] .     Care Summary Note: Incoming call from the patient's mother. She reports that the patient has been improving and will very soon be medically cleared at [facility]. Though the patient is medically cleared, she is still requiring treatment for her eating disorder and psychiatric needs. Mom and social worker Waddell at [facility], have identified a couple of options for placement. The one that mom mentioned is call [facility] and is located in [place], [place] . Mom states that the facility requires that the patient have a sitter/CNA with her while in the facility and asked whether or not this is something that is covered by the insurance. This ACM spoke with social worker, Waddell at [phone] to confirm the need. This ACM then sent an email inquiry to [employer] to verify the need for a PA, waiver, and to address coverage of a sitter. Will update Waddell and mom upon receiving a response from [employer].     Offered patient enrollment in the Remote Patient Monitoring (RPM) program for in-home monitoring: Patient is not eligible for RPM program because: insurance coverage.     Assessments Completed:   No changes since last call    Medications Reviewed:   Patient denies any changes with medications and reports taking all medications as prescribed.    Advance Care Planning:   Not reviewed during this call     Care Planning:   Not completed during this call    PCP/Specialist follow up:       Follow Up:   Plan for  next ACM outreach in approximately 1-2 days  to complete:  - follow up regarding transfer questions.   Caregiver is agreeable to this plan.      "

## 2024-02-17 MED FILL — VITAMIN D 50 MCG(2000 UT) TABS: 50 50 MCG (2000 UT) | ORAL | 90 days supply | Qty: 90 | Fill #3 | Status: AC

## 2024-02-17 MED FILL — PRUCALOPRIDE SUCCINATE 2MG TABS: 2 2 MG | ORAL | 90 days supply | Qty: 90 | Fill #1 | Status: AC

## 2024-02-17 NOTE — Care Coordination (Signed)
"  Ambulatory Care Coordination Note     02/17/2024 4:39 PM     Patient Current Location:  [place]      Email sent to the patient's mother, informing her that this ACM will keep her posted upon finding out what insurance will cover for transfer.      ACM: [name], RN     Challenges to be reviewed by the provider   Additional needs identified to be addressed with provider No                     Follow Up:   Plan for next ACM outreach in approximately 1-2 days  to complete:  - follow up appointment with mom regarding transfer to new facility.   Caregiver is agreeable to this plan.      "

## 2024-02-18 NOTE — Care Coordination (Signed)
"  Ambulatory Care Coordination Note     02/18/2024 10:58 AM     Patient Current Location:  [place]      Email sent to the patient's mother to inform her of incoming response from the insurance company regarding placement at [facility]:    I found [facility] to be a Tier 2 provider for the flex plan on the [employer].   The request for a 1:1 sitter or CNA for 24 hour monitoring is unusual while in a behavior health stepdown. The plan does have private duty nursing coverage, however, it may not cover this situation.   You are correct that a prior authorization would be needed for the facility and the inpatient stay.       ACM: [name], RN     Challenges to be reviewed by the provider   Additional needs identified to be addressed with provider No                 Method of communication with provider: phone.    Utilization: Patient has not had any utilization since our last call.     Care Summary Note: Patient remains inpatient at [facility] in [place], [place]. Sent an email to the patient's mother, informing her of the response that this ACM received from [employer]. This ACM also reached out to the social worker at [facility], Waddell to inform her of the same. She states that she will be calling [facility] today to discuss furthe details. This ACM is inquiring on how to proceed further in regards to identifying a sitter that is in network in [place]  or submitting a waiver, should the patient be approved. Will continue to follow and update the parties involved.       PCP/Specialist follow up:       Follow Up:   Plan for next ACM outreach in approximately 1-2 days  to complete:  - goal progression  - education .   Caregiver is agreeable to this plan.      "

## 2024-02-19 NOTE — Care Coordination (Signed)
"  Ambulatory Care Coordination Note     02/19/2024 3:04 PM     Patient Current Location:  [place]      Parent contacted the ACM by telephone. Verified name and DOB with parent as identifiers.         ACM: [name], RN     Challenges to be reviewed by the provider   Additional needs identified to be addressed with provider No                 Method of communication with provider: none.    Utilization: Remains in treatment at [facility] [place]     Care Summary Note: Telephonic outreach from mom regarding the patient's pending and upcoming discharge. They are no longer looking at the patient transferring to [facility] and the other facility, [facility] does not sound like it will meet her needs.   This ACM will attempt to identify options for personal duty care, as the patient looks like she will be discharged to home. A prior authorization must be completed by the determined provider. Mom is expecting a discharge date of mid next week.     Offered patient enrollment in the Remote Patient Monitoring (RPM) program for in-home monitoring: Patient is not eligible for RPM program because: insurance coverage.          "

## 2024-02-22 MED FILL — XOLAIR 300MG/2ML SOSY: 300 300 MG/2ML | SUBCUTANEOUS | 28 days supply | Qty: 4 | Fill #2 | Status: AC

## 2024-02-22 NOTE — Care Coordination (Signed)
"  Ambulatory Care Coordination Note     02/22/2024 5:15 PM     Patient Current Location:  [place]      ACM contacted the parent by telephone. Verified name and DOB with parent as identifiers.         ACM: [name], RN     Challenges to be reviewed by the provider   Additional needs identified to be addressed with provider No                 Method of communication with provider: none.    Utilization: Patient has returned home after discharge from [facility].     Care Summary Note: Telephonic outreach to the patient's mother. Mom sent an email informing this ACM that the patient has been discharged and is home. Per mom, the patient will need in home care assistance during the day from approximately 8-6pm. Mom reports that the patient will require assistance with eating/meal support, tachycardia symptoms, walking instability, and intermittent thoughts of self harm. The patient is now off TPN, but requires assistance in making sure she is eating her meals.   This ACM sent some in home care options to the patient's mother to determine best options. This ACM will then provide this option to the insurance to get a waiver approved. The facility will need to obtain a PA before moving forward. Will continue to work with mom to get the best care available for the patient.     Addendum: 10/15  [facility] Waiver faxed to Dr. Sueann office, in regards to the patient working with [facility] for care during the day. Will follow up with waiver process.      Offered patient enrollment in the Remote Patient Monitoring (RPM) program for in-home monitoring: Patient is not eligible for RPM program because: insurance coverage.     Assessments Completed:   No changes since last call    Medications Reviewed:   Completed during a previous call     Advance Care Planning:   Not reviewed during this call     Care Planning:   Not completed during this call    PCP/Specialist follow up:       Follow Up:   Plan for next  ACM outreach in approximately 1-2 days  to complete:  - goal progression  - education .   Caregiver is agreeable to this plan.      "

## 2024-02-25 NOTE — Care Coordination (Signed)
"  Ambulatory Care Coordination Note     02/25/2024 4:16 PM     Patient Current Location:  [place]      ACM contacted the parent by telephone. Verified name and DOB with parent as identifiers.         ACM: [name], RN     Challenges to be reviewed by the provider   Additional needs identified to be addressed with provider No                 Method of communication with provider: none.    Utilization: Patient has not had any utilization since our last call.     Care Summary Note: Telephonic outreach to the patient's mother to follow up regarding private duty nursing care. This ACM informed mom that [facility] does not accept insurance, however [facility] or [employer] [facility] both do and are in network. Mom to review these options. A referral will be needed to move forward.     Offered patient enrollment in the Remote Patient Monitoring (RPM) program for in-home monitoring: Patient is not eligible for RPM program because: insurance coverage.     Assessments Completed:   No changes since last call    Medications Reviewed:   Completed during a previous call     Advance Care Planning:   Not reviewed during this call     Care Planning:   Not completed during this call    PCP/Specialist follow up:       Follow Up:   Plan for next ACM outreach in approximately 1-2 days  to complete:  - goal progression  - education .   Caregiver is agreeable to this plan.      "

## 2024-02-26 NOTE — Care Coordination (Signed)
"  Ambulatory Care Coordination Note     02/26/2024 3:46 PM     Patient Current Location:  [place]      Parent contacted the ACM by telephone. Verified name and DOB with parent as identifiers.         ACM: [name], RN     Challenges to be reviewed by the provider   Additional needs identified to be addressed with provider No                 Method of communication with provider: none.    Utilization: Patient has not had any utilization since our last call.     Care Summary Note: Patient's mother outreached this ACM to suggest the [facility] would be the most versatile home health company to assist the patient. She indicated that she would reach out to the provider to scheduled an appointment. This ACM received a call from Roberto Exon at [facility] to get insurance information. Information provided. [facility] plan to call the patient insurance to obtain a PA for treatment.     Offered patient enrollment in the Remote Patient Monitoring (RPM) program for in-home monitoring: Patient is not eligible for RPM program because: insurance coverage.           Follow Up:   Plan for next ACM outreach in approximately 1-2 days  to complete:  - goal progression.   Caregiver is agreeable to this plan.      "

## 2024-02-29 NOTE — Care Coordination (Signed)
"  Ambulatory Care Coordination Note     02/29/2024 2:58 PM     Patient Current Location:  [place]              ACM: [name], RN     Challenges to be reviewed by the provider   Additional needs identified to be addressed with provider No                 Method of communication with provider: none.    Utilization: Patient has not had any utilization since our last call.     Care Summary Note: Incoming email from the patient's mother regarding [facility]. She states that they reached out and she is hoping to hear from them by today (10/20). This ACM responded via email, informing mom that this ACM received confirmation from the insurance that [employer] is not in network, but a waiver could be initiated. Will await a response and follow up as needed.     Offered patient enrollment in the Remote Patient Monitoring (RPM) program for in-home monitoring: Patient is not eligible for RPM program because: insurance coverage.         Follow Up:   Plan for next ACM outreach in approximately 1 week to complete:  .   Caregiver is agreeable to this plan.      "

## 2024-03-07 NOTE — Care Coordination (Signed)
"  Ambulatory Care Coordination Note     03/07/2024 5:15 PM     Patient Current Location:  [place]      Parent contacted the ACM by telephone. Verified name and DOB with parent as identifiers.         ACM: [name], RN     Challenges to be reviewed by the provider   Additional needs identified to be addressed with provider No                 Method of communication with provider: none.    Utilization: Patient has not had any utilization since our last call.     Care Summary Note: Incoming call from the patient's mother regarding changes in treatment moving forward. Mom would like to have the patient now work with [facility] for 20-25 weeks of ABA therapy. It is a program that works with teens and young adults with autism. This ACM will send the information to the insurance to start a waiver process. Will follow up with the patient's mother to continue the process.     Assessments Completed:   No changes since last call    Medications Reviewed:   Completed during a previous call     Advance Care Planning:   Not reviewed during this call     Care Planning:   Not completed during this call    PCP/Specialist follow up:       Follow Up:   Plan for next ACM outreach in approximately 1-2 days  to complete:  - waiver process.   Caregiver is agreeable to this plan.      "

## 2024-03-08 NOTE — Care Coordination (Signed)
"  Ambulatory Care Coordination Note     03/08/2024 4:10 PM     Patient Current Location:  [place]         ACM: [name], RN     Challenges to be reviewed by the provider   Additional needs identified to be addressed with provider No                 Method of communication with provider: none.    Utilization: Patient has not had any utilization since our last call.     Care Summary Note: Email sent to the patient providing information regarding a waiver that was submitted to Dr. Sueann office for signature. Mom is attempted to get services for the patient through [facility]. She is looking for in home care 20-25 hours per week. Will follow up with the waiver and continue communication with mom.     Follow Up:   Plan for next ACM outreach in approximately 1-2 days  to complete:  - waiver follow up.   Caregiver is agreeable to this plan.      "

## 2024-03-16 NOTE — Care Coordination (Signed)
"  Ambulatory Care Coordination Note     03/16/2024 1:41 PM     Patient Current Location:  [place]      Parent contacted the ACM by telephone. Verified name and DOB with parent as identifiers.         ACM: [name], RN     Challenges to be reviewed by the provider   Additional needs identified to be addressed with provider No                 Method of communication with provider: none.    Utilization: Patient has not had any utilization since our last call.     Care Summary Note: Incoming call from the patient's mother to follow up on pending insurance issues. This ACM informed mom that a waiver was submitted to Dr. Laurin office for insurance approval. Mom plans to contact Dr. Sueann office to follow up with the waiver that was initiated. She was informed by [facility] that they will be reaching out to begin the insurance process, which is assumed to mean the pre-authorization process. The patient is seeking in home care 20-25 hours per week for ABA therapy, related to autism and eating disorders.   Mom also inquired regarding the submission of superbills. This ACM encouraged mom to outreach [facility] to discuss the superbill process and answer questions.   Will continue to outreach and work with mom to address needs.     Assessments Completed:   No changes since last call    Medications Reviewed:   Patient denies any changes with medications and reports taking all medications as prescribed.    Advance Care Planning:   Not reviewed during this call     Care Planning:   Not completed during this call    PCP/Specialist follow up:       Follow Up:   Plan for next ACM outreach in approximately 1 week to complete:  - advance care planning   - goal progression.   Caregiver is agreeable to this plan.      "

## 2024-03-21 MED FILL — XOLAIR 300MG/2ML SOSY: 300 300 MG/2ML | SUBCUTANEOUS | 28 days supply | Qty: 4 | Fill #3 | Status: AC

## 2024-03-21 NOTE — Care Coordination (Signed)
"  Ambulatory Care Coordination Note     03/21/2024 1:34 PM     Incoming call from [place] at [facility]. Explained to Manuelita that this ACM has faxed a waive to Dr. Sueann office for insurance coverage. Once this ACM receives the waiver or it is faxed to [employer], Manuelita will be contacted with the authorization information. At that Manuelita will call to get pre authorization so that the patient can start ABA therapy. Will await further information on the waiver and proceed forward when it is received.   "

## 2024-03-22 NOTE — Care Coordination (Signed)
"  Ambulatory Care Coordination Note     03/22/2024 12:13 PM     Patient Current Location:  [place]      Email sent to the patient's mother updating her on call with [facility] yesterday.      ACM: [name], RN     Challenges to be reviewed by the provider   Additional needs identified to be addressed with provider No                 Method of communication with provider: none.    Utilization: Patient has not had any utilization since our last call.     Care Summary Note: Outgoing email to the patient's mother informing her that this ACM has still not received any information about the pending waiver that has been faxed twice. Informed the patient about the call received from [place] at [facility], on 11/10. Will continue to work with mom and providers.     Assessments Completed:   No changes since last call        PCP/Specialist follow up:       Follow Up:   Plan for next ACM outreach in approximately 1 week to complete:  - waiver updates.   Caregiver is agreeable to this plan.      "

## 2024-03-28 NOTE — Care Coordination (Signed)
"  Ambulatory Care Coordination Note     03/28/2024 11:06 AM     Patient Current Location:        ACM contacted the parent by email. Verified name and DOB with parent as identifiers.         ACM: [name], RN     Challenges to be reviewed by the provider   Additional needs identified to be addressed with provider No                 Method of communication with provider: none.    Utilization: Patient has not had any utilization since our last call.     Care Summary Note: Telephonic outreach to [place] with [facility]. Message left providing pre-cert number, to move forward with the patient seeing [facility] for ABA therapy. Sent the patient's mother the update via email. Informed mom that that [facility] will be covered as a tier 1 and that a message has been left with [facility] to move forward with getting the prior authorization completed and the services started.     Assessments Completed:   No changes since last call    Medications Reviewed:   Patient denies any changes with medications and reports taking all medications as prescribed.    Advance Care Planning:   Not reviewed during this call     Care Planning:   Not completed during this call    PCP/Specialist follow up:       Follow Up:   Plan for next ACM outreach in approximately 1 week to complete:  - goal progression  - education .   Caregiver is agreeable to this plan.                "

## 2024-03-29 MED FILL — EPINEPHRINE 0.3MG/0.3ML SOAJ: 0.3 0.3 MG/0.3ML | INTRAMUSCULAR | 1 days supply | Qty: 2 | Fill #0 | Status: AC

## 2024-03-29 MED FILL — HYDROXYZINE HCL 25MG TABS: 25 25 MG | ORAL | 1 days supply | Qty: 1 | Fill #0 | Status: AC

## 2024-04-04 MED FILL — GUANFACINE HCL ER 4MG TB24: 4 4 MG | ORAL | 90 days supply | Qty: 90 | Fill #0 | Status: AC

## 2024-04-05 MED FILL — LUBIPROSTONE 24MCG CAPS: 24 24 MCG | ORAL | 90 days supply | Qty: 180 | Fill #0 | Status: AC

## 2024-04-05 MED FILL — REXULTI 2MG TABS: 2 2 MG | ORAL | 90 days supply | Qty: 90 | Fill #0 | Status: AC

## 2024-04-13 NOTE — Care Coordination (Signed)
"  Ambulatory Care Coordination Note     04/13/2024 2:02 PM     Parent outreach attempt by this ACM today to perform care management follow up . ACM was unable to reach the parent by telephone today;   left voice message requesting a return phone call to this ACM.     ACM: [name], RN     Care Summary Note: Telephonic outreach attempt to follow up with the patient's mother, Clotilda. Left a discreet VM with a request for a return call. Will continue outreach attempts     PCP/Specialist follow up:       Follow Up:   Plan for next ACM outreach in approximately 1 week to complete:  - goal progression  - education .           "

## 2024-04-14 MED FILL — DULOXETINE HCL 60MG CPEP: 60 60 MG | ORAL | 90 days supply | Qty: 90 | Fill #0 | Status: AC

## 2024-04-14 MED FILL — PROCHLORPERAZINE MALEATE 5MG TABS: 5 5 MG | ORAL | 90 days supply | Qty: 315 | Fill #0 | Status: AC

## 2024-04-14 MED FILL — PANTOPRAZOLE SODIUM 40MG TBEC: 40 40 MG | ORAL | 90 days supply | Qty: 90 | Fill #0 | Status: AC

## 2024-04-15 MED FILL — GABAPENTIN 600MG TABS: 600 600 MG | ORAL | 90 days supply | Qty: 270 | Fill #0 | Status: AC

## 2024-04-18 MED FILL — XOLAIR 300MG/2ML SOSY: 300 300 MG/2ML | SUBCUTANEOUS | 28 days supply | Qty: 4 | Fill #4 | Status: AC

## 2024-04-19 NOTE — Care Coordination (Signed)
"  Ambulatory Care Coordination Note     04/19/2024 2:23 PM     Patient Current Location:  [place]              ACM: [name], RN     Challenges to be reviewed by the provider   Additional needs identified to be addressed with provider No                 Method of communication with provider: none.    Utilization: Patient has not had any utilization since our last call.     Care Summary Note: This ACM had reached out to mom to assess progress in at home autism care. Mom returned this ACM call and states that she is waiting to hear back from [facility] to set up to possibly 40 hours of care per week. Mom states that she is hopeful to hear back soon.   This ACM will continue to outreach to offer assistance if needed.     Assessments Completed:   No changes since last call    Medications Reviewed:   Patient denies any changes with medications and reports taking all medications as prescribed.    Advance Care Planning:   Not reviewed during this call     Care Planning:   Not completed during this call    PCP/Specialist follow up:       Follow Up:   Plan for next ACM outreach in approximately 2 weeks to complete:  - goal progression  - education .   Caregiver is agreeable to this plan.      "

## 2024-05-16 MED FILL — PRUCALOPRIDE SUCCINATE 2MG TABS: 2 2 MG | ORAL | 90 days supply | Qty: 90 | Fill #2 | Status: AC

## 2024-05-16 NOTE — Care Coordination (Signed)
"  Ambulatory Care Coordination Note     05/16/2024 2:13 PM     Parent outreach attempt by this ACM today to perform care management follow up . ACM was unable to reach the parent by telephone today;   left voice message requesting a return phone call to this ACM.     ACM: [name], RN     Care Summary Note: Telephonic outreach attempt to follow up and assess care management needs. Left a discreet VM with a request for a return call. Will continue outreach attempts to assess needs.     PCP/Specialist follow up:       Follow Up:   Plan for next ACM outreach in approximately 2 weeks to complete:  - goal progression  - education .           "

## 2024-05-18 MED FILL — XOLAIR 300MG/2ML SOSY: 300 300 MG/2ML | SUBCUTANEOUS | 28 days supply | Qty: 4 | Fill #5 | Status: AC

## 2024-05-26 NOTE — Care Coordination (Signed)
"  Ambulatory Care Coordination Note     05/26/2024 1:36 PM     Parent outreach attempt by this ACM today to perform care management follow up . ACM was unable to reach the parent by telephone today;   left voice message requesting a return phone call to this ACM.     ACM: [name], RN     Care Summary Note: Parent left a VM with this ACM, this ACM attempted to return the call and left a VM. Mom is asking for assistance in learning why one of the patient's medications is not being covered by insurance. Left a discreet VM and will continue attempts to outreach the patient's mother to assist.     PCP/Specialist follow up:       Follow Up:   Plan for next ACM outreach in approximately 1 week to complete:  - goal progression  - education .           "

## 2024-06-06 NOTE — Care Coordination (Signed)
 Ambulatory Care Coordination Note     06/06/2024 3:01 PM     Parent outreach attempt by this ACM today to perform care management follow up . ACM was unable to reach the parent by telephone today;   left voice message requesting a return phone call to this ACM.     ACM: [name], RN     Care Summary Note: Telephonic outreach to the patient's mother, returning her call. Left a discreet VM with a request for a return call. Will continue outreach attempts.     PCP/Specialist follow up:       Follow Up:   Plan for next ACM outreach in approximately 1 week to complete:  - goal progression.

## 2024-06-10 NOTE — Care Coordination (Signed)
 Ambulatory Care Coordination Note     06/10/2024 11:10 AM     Incoming call from Case Manage Wendelyn Puls at [facility]. Ms Puls reached out to request assistance with identifying home health options that are in network for the patient. This ACM offered to assist in identifying options, but later received a message that home health has been found and willing to work with the patient. Will continue to assist with the patient as needed.

## 2024-06-15 MED FILL — XOLAIR 300MG/2ML SOSY: 300 300 MG/2ML | 28 days supply | Qty: 4 | Fill #6 | Status: AC
